# Patient Record
Sex: Male | Born: 1975 | Hispanic: No | Marital: Married | Smoking: Former smoker
Health system: Southern US, Community
[De-identification: ages and names within clinical notes are randomized; demographics above are authoritative.]

## PROBLEM LIST (undated history)

## (undated) DIAGNOSIS — I480 Paroxysmal atrial fibrillation: Secondary | ICD-10-CM

## (undated) DIAGNOSIS — I4891 Unspecified atrial fibrillation: Secondary | ICD-10-CM

## (undated) DIAGNOSIS — I10 Essential (primary) hypertension: Secondary | ICD-10-CM

## (undated) DIAGNOSIS — Z973 Presence of spectacles and contact lenses: Secondary | ICD-10-CM

## (undated) DIAGNOSIS — K269 Duodenal ulcer, unspecified as acute or chronic, without hemorrhage or perforation: Secondary | ICD-10-CM

## (undated) DIAGNOSIS — G4730 Sleep apnea, unspecified: Secondary | ICD-10-CM

## (undated) DIAGNOSIS — M109 Gout, unspecified: Secondary | ICD-10-CM

## (undated) DIAGNOSIS — K259 Gastric ulcer, unspecified as acute or chronic, without hemorrhage or perforation: Secondary | ICD-10-CM

## (undated) HISTORY — PX: WISDOM TOOTH EXTRACTION: SHX21

## (undated) HISTORY — DX: Duodenal ulcer, unspecified as acute or chronic, without hemorrhage or perforation: K26.9

## (undated) HISTORY — PX: ABDOMINAL SURGERY: SHX537

---

## 2002-03-07 ENCOUNTER — Emergency Department (HOSPITAL_COMMUNITY): Admission: EM | Admit: 2002-03-07 | Discharge: 2002-03-08 | Payer: Self-pay | Admitting: Emergency Medicine

## 2002-09-05 ENCOUNTER — Encounter: Payer: Self-pay | Admitting: Emergency Medicine

## 2002-09-05 ENCOUNTER — Emergency Department (HOSPITAL_COMMUNITY): Admission: EM | Admit: 2002-09-05 | Discharge: 2002-09-05 | Payer: Self-pay | Admitting: Emergency Medicine

## 2003-03-27 ENCOUNTER — Emergency Department (HOSPITAL_COMMUNITY): Admission: EM | Admit: 2003-03-27 | Discharge: 2003-03-27 | Payer: Self-pay | Admitting: Emergency Medicine

## 2004-04-25 ENCOUNTER — Emergency Department (HOSPITAL_COMMUNITY): Admission: EM | Admit: 2004-04-25 | Discharge: 2004-04-25 | Payer: Self-pay | Admitting: Emergency Medicine

## 2005-06-22 ENCOUNTER — Emergency Department (HOSPITAL_COMMUNITY): Admission: EM | Admit: 2005-06-22 | Discharge: 2005-06-22 | Payer: Self-pay | Admitting: Family Medicine

## 2005-08-29 ENCOUNTER — Emergency Department (HOSPITAL_COMMUNITY): Admission: EM | Admit: 2005-08-29 | Discharge: 2005-08-29 | Payer: Self-pay | Admitting: Family Medicine

## 2006-11-02 ENCOUNTER — Emergency Department (HOSPITAL_COMMUNITY): Admission: EM | Admit: 2006-11-02 | Discharge: 2006-11-02 | Payer: Self-pay | Admitting: Family Medicine

## 2006-11-09 ENCOUNTER — Emergency Department (HOSPITAL_COMMUNITY): Admission: EM | Admit: 2006-11-09 | Discharge: 2006-11-10 | Payer: Self-pay | Admitting: Emergency Medicine

## 2006-11-24 ENCOUNTER — Emergency Department (HOSPITAL_COMMUNITY): Admission: EM | Admit: 2006-11-24 | Discharge: 2006-11-24 | Payer: Self-pay | Admitting: Family Medicine

## 2006-12-27 ENCOUNTER — Encounter: Payer: Self-pay | Admitting: Family Medicine

## 2006-12-27 ENCOUNTER — Ambulatory Visit: Payer: Self-pay | Admitting: Family Medicine

## 2006-12-27 DIAGNOSIS — M109 Gout, unspecified: Secondary | ICD-10-CM | POA: Insufficient documentation

## 2006-12-27 DIAGNOSIS — I499 Cardiac arrhythmia, unspecified: Secondary | ICD-10-CM

## 2007-01-03 ENCOUNTER — Ambulatory Visit: Payer: Self-pay | Admitting: Family Medicine

## 2007-01-08 ENCOUNTER — Encounter (INDEPENDENT_AMBULATORY_CARE_PROVIDER_SITE_OTHER): Payer: Self-pay | Admitting: *Deleted

## 2007-01-09 ENCOUNTER — Ambulatory Visit: Payer: Self-pay | Admitting: Family Medicine

## 2007-01-09 LAB — CONVERTED CEMR LAB
AST: 18 units/L (ref 0–37)
Albumin: 3.9 g/dL (ref 3.5–5.2)
Alkaline Phosphatase: 73 units/L (ref 39–117)
BUN: 9 mg/dL (ref 6–23)
CO2: 29 meq/L (ref 19–32)
Glucose, Bld: 79 mg/dL (ref 70–99)
HDL: 30.1 mg/dL — ABNORMAL LOW (ref >39.0)
LDL Cholesterol: 96 mg/dL (ref 0–99)
Potassium: 4.6 meq/L (ref 3.5–5.1)
TSH: 2.21 microintl units/mL (ref 0.35–5.50)
Total Protein: 6.5 g/dL (ref 6.0–8.3)
Triglycerides: 146 mg/dL (ref 0–149)
VLDL: 29 mg/dL (ref 0–40)

## 2007-02-12 ENCOUNTER — Telehealth: Payer: Self-pay | Admitting: Family Medicine

## 2007-02-18 ENCOUNTER — Ambulatory Visit: Payer: Self-pay | Admitting: Family Medicine

## 2007-02-19 ENCOUNTER — Encounter: Admission: RE | Admit: 2007-02-19 | Discharge: 2007-02-19 | Payer: Self-pay | Admitting: Family Medicine

## 2007-04-27 ENCOUNTER — Emergency Department (HOSPITAL_COMMUNITY): Admission: EM | Admit: 2007-04-27 | Discharge: 2007-04-27 | Payer: Self-pay | Admitting: Family Medicine

## 2007-05-02 ENCOUNTER — Ambulatory Visit: Payer: Self-pay | Admitting: Family Medicine

## 2007-05-02 ENCOUNTER — Encounter: Admission: RE | Admit: 2007-05-02 | Discharge: 2007-05-02 | Payer: Self-pay | Admitting: Family Medicine

## 2007-05-09 ENCOUNTER — Telehealth: Payer: Self-pay | Admitting: Family Medicine

## 2007-05-13 ENCOUNTER — Encounter: Admission: RE | Admit: 2007-05-13 | Discharge: 2007-08-11 | Payer: Self-pay | Admitting: Family Medicine

## 2007-05-20 ENCOUNTER — Encounter: Admission: RE | Admit: 2007-05-20 | Discharge: 2007-05-20 | Payer: Self-pay | Admitting: Family Medicine

## 2007-05-23 ENCOUNTER — Telehealth (INDEPENDENT_AMBULATORY_CARE_PROVIDER_SITE_OTHER): Payer: Self-pay | Admitting: *Deleted

## 2007-05-31 ENCOUNTER — Encounter: Payer: Self-pay | Admitting: Family Medicine

## 2007-08-21 ENCOUNTER — Emergency Department (HOSPITAL_COMMUNITY): Admission: EM | Admit: 2007-08-21 | Discharge: 2007-08-21 | Payer: Self-pay | Admitting: Emergency Medicine

## 2007-08-26 ENCOUNTER — Ambulatory Visit: Payer: Self-pay | Admitting: Family Medicine

## 2007-08-26 LAB — CONVERTED CEMR LAB
Ketones, urine, test strip: NEGATIVE
Nitrite: NEGATIVE
Urobilinogen, UA: NEGATIVE
pH: 6

## 2007-09-02 ENCOUNTER — Encounter: Admission: RE | Admit: 2007-09-02 | Discharge: 2007-09-02 | Payer: Self-pay | Admitting: Family Medicine

## 2007-09-02 ENCOUNTER — Emergency Department (HOSPITAL_COMMUNITY): Admission: EM | Admit: 2007-09-02 | Discharge: 2007-09-02 | Payer: Self-pay | Admitting: Emergency Medicine

## 2007-09-02 ENCOUNTER — Encounter: Admission: RE | Admit: 2007-09-02 | Discharge: 2007-09-02 | Payer: Self-pay | Admitting: Orthopaedic Surgery

## 2007-09-06 DIAGNOSIS — N281 Cyst of kidney, acquired: Secondary | ICD-10-CM

## 2007-09-09 ENCOUNTER — Encounter: Admission: RE | Admit: 2007-09-09 | Discharge: 2007-09-09 | Payer: Self-pay | Admitting: Family Medicine

## 2007-09-10 ENCOUNTER — Encounter (INDEPENDENT_AMBULATORY_CARE_PROVIDER_SITE_OTHER): Payer: Self-pay | Admitting: *Deleted

## 2007-09-11 ENCOUNTER — Ambulatory Visit: Payer: Self-pay | Admitting: Internal Medicine

## 2007-09-11 ENCOUNTER — Inpatient Hospital Stay (HOSPITAL_COMMUNITY): Admission: EM | Admit: 2007-09-11 | Discharge: 2007-09-13 | Payer: Self-pay | Admitting: Emergency Medicine

## 2007-09-12 ENCOUNTER — Encounter: Payer: Self-pay | Admitting: Family Medicine

## 2007-09-13 ENCOUNTER — Encounter: Payer: Self-pay | Admitting: Family Medicine

## 2007-09-13 ENCOUNTER — Telehealth: Payer: Self-pay | Admitting: Family Medicine

## 2007-09-13 ENCOUNTER — Ambulatory Visit: Payer: Self-pay | Admitting: Internal Medicine

## 2007-09-16 ENCOUNTER — Telehealth: Payer: Self-pay | Admitting: Family Medicine

## 2007-09-18 DIAGNOSIS — K269 Duodenal ulcer, unspecified as acute or chronic, without hemorrhage or perforation: Secondary | ICD-10-CM | POA: Insufficient documentation

## 2007-09-24 ENCOUNTER — Encounter: Payer: Self-pay | Admitting: Family Medicine

## 2007-11-01 ENCOUNTER — Encounter: Payer: Self-pay | Admitting: Family Medicine

## 2008-01-02 ENCOUNTER — Telehealth: Payer: Self-pay | Admitting: Family Medicine

## 2008-02-20 ENCOUNTER — Telehealth: Payer: Self-pay | Admitting: Family Medicine

## 2008-02-21 ENCOUNTER — Telehealth: Payer: Self-pay | Admitting: Family Medicine

## 2008-07-11 ENCOUNTER — Emergency Department (HOSPITAL_COMMUNITY): Admission: EM | Admit: 2008-07-11 | Discharge: 2008-07-12 | Payer: Self-pay | Admitting: Emergency Medicine

## 2010-03-16 ENCOUNTER — Emergency Department (HOSPITAL_COMMUNITY): Admission: EM | Admit: 2010-03-16 | Discharge: 2010-03-16 | Payer: Self-pay | Admitting: Emergency Medicine

## 2010-03-18 ENCOUNTER — Emergency Department (HOSPITAL_COMMUNITY): Admission: EM | Admit: 2010-03-18 | Discharge: 2010-03-18 | Payer: Self-pay | Admitting: Emergency Medicine

## 2010-03-25 ENCOUNTER — Ambulatory Visit (HOSPITAL_COMMUNITY): Admission: RE | Admit: 2010-03-25 | Discharge: 2010-03-25 | Payer: Self-pay | Admitting: Neurology

## 2010-11-20 LAB — URINALYSIS, ROUTINE W REFLEX MICROSCOPIC
Nitrite: NEGATIVE
Protein, ur: NEGATIVE mg/dL
Specific Gravity, Urine: 1.016 (ref 1.005–1.030)
Urobilinogen, UA: 1 mg/dL (ref 0.0–1.0)

## 2010-11-20 LAB — DIFFERENTIAL
Basophils Absolute: 0 10*3/uL (ref 0.0–0.1)
Basophils Relative: 0 % (ref 0–1)
Lymphocytes Relative: 21 % (ref 12–46)
Lymphs Abs: 1.3 10*3/uL (ref 0.7–4.0)
Monocytes Relative: 6 % (ref 3–12)
Neutro Abs: 4.6 10*3/uL (ref 1.7–7.7)
Neutrophils Relative %: 71 % (ref 43–77)

## 2010-11-20 LAB — CSF CELL COUNT WITH DIFFERENTIAL
RBC Count, CSF: 17 /mm3 — ABNORMAL HIGH
Tube #: 1

## 2010-11-20 LAB — CBC
Hemoglobin: 14.3 g/dL (ref 13.0–17.0)
MCH: 28.7 pg (ref 26.0–34.0)
MCHC: 34.9 g/dL (ref 30.0–36.0)
MCV: 82.4 fL (ref 78.0–100.0)

## 2010-11-20 LAB — BASIC METABOLIC PANEL
Chloride: 108 mEq/L (ref 96–112)
GFR calc Af Amer: >60 mL/min (ref >60)
GFR calc non Af Amer: >60 mL/min (ref >60)
Sodium: 142 mEq/L (ref 135–145)

## 2010-11-20 LAB — CSF CULTURE W GRAM STAIN

## 2010-11-20 LAB — GLUCOSE, CSF: Glucose, CSF: 61 mg/dL (ref 43–76)
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
## 2011-01-17 NOTE — Discharge Summary (Signed)
NAME:  Samuel Flynn, Samuel Flynn NO.:  [id number]   MEDICAL RECORD NO.:  [id number]          PATIENT TYPE:  INP   LOCATION:  3025                         FACILITY:  [facility]   PHYSICIAN:  Sean A. Everardo All, MD    DATE OF BIRTH:  1975/12/26   DATE OF ADMISSION:  09/11/2007  DATE OF DISCHARGE:  09/13/2007                               DISCHARGE SUMMARY   DISCHARGE DIAGNOSIS:  1. Right flank/right upper quadrant pain, status post endoscopy      performed September 11, 2006 which was positive for duodenal ulcer.  2. Chronic back pain with known central disk protrusion at T2-T3.      Plan for outpatient appointment with Dr. Ophelia Charter on January 19 as      scheduled.  3. Depression, worsened with chronic pain.  The patient is agreeable a      trial of Cymbalta which we will initiate at time of discharge.   HISTORY OF PRESENT ILLNESS:  Mr. Sporer is a 35 year old male who was  admitted on September 10, 2006 with chief complaint of back pain. He  described the pain as above his right flank. He noted some radiation  along the side to the front of his abdomen.  He noted on admission the  pain was intermittent that was a 10 out of 10 when it flared up.  He was  admitted for further evaluation and treatment.   PAST MEDICAL HISTORY:  1. Gout.  2. Chronic back pain.   COURSE OF HOSPITALIZATION.:  1. Right flank pain radiating to right upper quadrant/epigastric      region.  The patient was admitted.  He was noted to have a white      blood cell count on admission of 19,000.  Follow-up white blood      cell count was 10.8.  The CT angio of the chest was performed which      showed persistent inflammation centered around the descending      duodenum.  It was negative for dissection or pulmonary embolus.      The patient was seen in consultation by [facility], Dr. Stan Head and underwent endoscopy which was performed on September 11, 2006.  Endoscopy was notable for a large duodenal bulb  ulcer      secondary to NSAID use which was felt that it possibly could be      related to his pain, however it was not his initial problem as his      back pain had been present for 1 year.  Erosive esophagitis was      noted as well.  The patient was placed on a proton pump inhibitor.      It was recommended that he on twice daily proton pump inhibitor for      2 weeks and once daily for a total of 8 weeks and consideration for      chronic proton pump inhibitor was also noted.  Dr. Stan Head      wishes to see the patient back as needed. The patient  had been      taking narcotic medications at home including Percocet and Vicodin      as needed for his pain and he states he is not getting relief with      these medications.  As the patient's pain seems to be neuropathic      in nature we will give him a trial of Neurontin to see if this does      not help with his pain.  In addition, he appeared to be quite      depressed during this admission and noted that his chronic pain is      contributing to his depression.  Dr. Leone Payor noted that the patient      might be considered for Cymbalta for both his pain and his      depression.  We will initiate this at time of discharge if the      patient is agreeable.   MEDICATIONS:  1. Protonix 40 mg 1 tablet p.o. b.i.d. x2 weeks and then 1 tablet      daily thereafter.  Prescription given for 2 months supply.  2. Cymbalta 20 mg p.o. daily.  3. Neurontin 300 mg tabs 1 tablet p.o. today then starting September 14, 2007 take 1 tablet p.o. in the a.m. and 1 tablet p.o. in the p.m.      Then starting September 15, 2007 take 1 tablet p.o. t.i.d. for pain.  4. Vicodin 5/500 1 tablet p.o. q.4 hours as needed.  5. Methocarbamol 500 mg p.o. q.6 hours as needed.   The patient was instructed to avoid Motrin, ibuprofen, Aleve an aspirin.   DISPOSITION:  The patient will be discharged to home.   FOLLOW UP:  The patient is scheduled to follow up with  Dr. Kerby Nora  on January 16 at 3:15 p.m.  He is also to follow up with Dr. Ophelia Charter on  January 19 as scheduled.   PHYSICAL EXAM:  VITAL SIGNS:  BP 123/74, heart rate 63, respiratory rate  18, temperature 97.7, O2 sat 96% on room air.  GENERAL: The patient is an obese white male who is awake and alert with  a flat affect. He does appear depressed.  CARDIOVASCULAR: S1 and S2.  Regular rate and rhythm.  LUNGS:  Lungs were clear to auscultation bilaterally without wheezes,  rales or rhonchi.  ABDOMEN: Soft, nontender, nondistended.  Positive  bowel sounds are noted.  EXTREMITIES:  Showed no peripheral edema.   PERTINENT LABORATORY DATA:  At time of discharge H.  Pylori negative.  Hemoglobin A1c 4.9, white blood cell count 10.8, hemoglobin 13.9,  hematocrit 40.1, platelets 339, BUN 15, creatinine 1.11, amylase normal.   DISCHARGE DISPOSITION:  Discharge patient to home.      Sandford Craze, NP      Gregary Signs A. Everardo All, MD  Electronically Signed    MO/MEDQ  D:  09/13/2007  T:  09/13/2007  Job:  [id number]   cc:   Kerby Nora, MD  Veverly Fells. Ophelia Charter, M.D.  Iva Boop, MD,FACG

## 2011-01-17 NOTE — H&P (Signed)
NAME:  Samuel Flynn, Samuel Flynn NO.:  [id number]   MEDICAL RECORD NO.:  [id number]          PATIENT TYPE:  INP   LOCATION:  1830                         FACILITY:  [facility]   PHYSICIAN:  Hollice Espy, M.D.DATE OF BIRTH:  03-17-1976   DATE OF ADMISSION:  09/11/2007  DATE OF DISCHARGE:                              HISTORY & PHYSICAL   PRIMARY CARE PHYSICIAN:  Kerby Nora, MD   CHIEF COMPLAINT:  Back pain.   HISTORY OF PRESENT ILLNESS:  The patient is a 35 year old white male  with past medical history of gout who several months ago started having  complaints of back pain.  The location is described as above his right  flank, more corresponding to the middle to lower lobe of his lung.  He  has some radiation, he feels it can radiate along his side to the front  of his abdomen, but he definitely feels that it is in his back.  When  asked about how this relates to his breathing, he says he feels short of  breath because of it.  It is hard for him to take a deep breath in.  He  can certainly feel it then at that time.  He denies any other complaints  such as hemoptysis or cough but his pain has continued to persist.  It  has been ongoing for several months.  He says it intermittently flares  up or gets pretty severe to be like almost a 10/10 but otherwise he  never notes whether it completely goes away.  The patient has been seen  in the emergency room a few times, twice in December.  At that time he  was given medication for pain but he says the pain medication helped  briefly but when it flares up, it is not able to improve this very much.  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  CT of the abdomen and pelvis without contrast on Jan 31, 2007:  It noted inflammatory changes in the region of the first and  second portion of the duodenum adjacent to the  pancreatic head.  This  may be seen due to duodenal ulcer disease with penetration and  pancreatitis in this region, and incidentally noting a 2.4-cm cystic  lesion in the left kidney and some patchy airspace disease in the left  lower lobe with no evidence of any renal or ureteral calculi.  An  ultrasound of the abdomen done September 02, 2007, noted no evidence of  gallstones, hydronephrosis, and they continued to note a complex cystic  lesion in the left kidney.  These were previous films done September 02, 2007.  The patient as an outpatient had a CT scan of the abdomen done  with and without contrast on Jan 11, 2008.  The patient was noted to have  clear lung bases, heart size normal, the same 1.9-cm lesion in the left  kidney.  A wedge-shaped low-attenuation defect laterally in the spleen,  possibly  a small infarct or flap, and thickening and narrowing of the  descending duodenum with some pooling of oral contrast and mild  periduodenal inflammation similar to findings seen on September 02, 2007.  What was also noted was that on the patient's last ER visit on September 02, 2007, the CBC was slightly elevated at 12.5 with 90% neutrophils.  When the patient's flare-up today started and he presented to the  emergency room, his white count increased up to 19.6 with 97%  neutrophils.  The rest of his labs are essentially unremarkable  including normal BMET, normal lipase and negative UA.  A D-dimer was  ordered, which was found to be in the normal range at 0.29.  I have  ordered a chest x-ray, which is pending.  Currently the patient  complains of continued similar pain again in the same area.   He denies any headaches, vision changes, dysphagia, no chest pain, no  palpitations.  He did complain of some shortness of breath and  difficulty taking a deep inspiration, no cough, no wheezing.  He had  some right upper quadrant abdominal pain, which he says is more  radiation coming from the  back, but his main complaint is of this pain  right above his right flank, not really tender, with no tenderness to  deep palpation or even light blows.  No hematuria, dysuria,  constipation, diarrhea, focal extremity numbness, weakness, pain.  Review of systems otherwise negative.   PAST MEDICAL HISTORY:  Gout.   MEDICATIONS:  He is just on the medications he has received from the  last few ER visits, muscle relaxers and oral narcotics.  The ER has  reviewed his narcotic prescriptions since he has come into the emergency  room in the past year and it shows no signs of excessive use, abuse or  disproportionate amounts in regard to pain.   He has no known drug allergies.   SOCIAL HISTORY:  He denies any tobacco, alcohol or drug use.   FAMILY HISTORY:  Noncontributory.   PHYSICAL EXAM:  The patient's vitals on admission:  Temperature 97,  heart rate 89, blood pressure 154/104, respirations 22, O2 saturation  94% on room air.  GENERAL:  He is alert and oriented x3.  He is in moderate distress  secondary to pain.  HEENT:  Normocephalic, atraumatic.  His mucous membranes are dry.  He  has no carotid bruits.  HEART:  Regular rate and rhythm, S1, S2.  LUNGS:  He has trouble taking a deep inspiration but otherwise is  completely clear to auscultation bilaterally.  ABDOMEN:  Soft, some mild tenderness in the right upper quadrant but  really no real guarding.  Obese.  Nondistended.  Positive bowel sounds.  EXTREMITIES:  No clubbing, cyanosis, or edema.   LAB WORK:  White count 19.6, H&H 15.5 and 46, MCV of 80, platelet count  463, 87% neutrophils.  Sodium 136, potassium 4.4, chloride 100, bicarb  26, BUN 14, creatinine 0.96, glucose 118.  LFTs unremarkable.  UA notes  protein of 30.  Lipase is 28.  D-dimer is 0.29.  Chest x-ray:  I am  waiting for the result.   ASSESSMENT AND PLAN:  1. Back pain, right-sided, above his flank, question pleuritic.  D-      dimer is negative.  It could  be infarct but with his elevated      leukocytosis I question the possibility of infection, although pain      seems to be  his biggest component for this.  It is difficult to say      what the next step would be if his chest x-ray is completely      normal.  I will consider the possibility of a CT scan  __________.      Given his elevated blood pressure, acute findings for pain, no      previous signs of conversion disorder or drug-seeking behavior and      elevated white count, I am concerned about the possibility of      infection versus some other source for pain.  It appears to be      higher in the chest cavity than previously in the abdomen is      focused on.  On the other hand, the only focal finding in that      region is more in the anterior aspect in the duodenal area, the      possibility that this could be all some sort of duodenal ulcer,      although it appears atypical.  We will continue to follow.  2. History of gout, stable.      Hollice Espy, M.D.  Electronically Signed     SKK/MEDQ  D:  09/11/2007  T:  09/11/2007  Job:  [id number]   cc:   Kerby Nora, MD

## 2011-05-25 LAB — COMPREHENSIVE METABOLIC PANEL
AST: 14
Albumin: 3.9
BUN: 14
Chloride: 100
Creatinine, Ser: 0.96
GFR calc Af Amer: >60
Potassium: 4.4
Total Bilirubin: 0.5
Total Protein: 7.6

## 2011-05-25 LAB — URINALYSIS, ROUTINE W REFLEX MICROSCOPIC
Bilirubin Urine: NEGATIVE
Glucose, UA: NEGATIVE
Hgb urine dipstick: NEGATIVE
Specific Gravity, Urine: 1.027
Urobilinogen, UA: 0.2
pH: 5

## 2011-05-25 LAB — DIFFERENTIAL
Basophils Absolute: 0.1
Eosinophils Relative: 0
Lymphocytes Relative: 8 — ABNORMAL LOW
Monocytes Absolute: 0.9
Monocytes Relative: 5
Neutro Abs: 17 — ABNORMAL HIGH

## 2011-05-25 LAB — HEMOGLOBIN A1C: Hgb A1c MFr Bld: 4.9

## 2011-05-25 LAB — CBC
Hemoglobin: 13.9
MCHC: 34.6
MCV: 80.4
Platelets: 339
Platelets: 463 — ABNORMAL HIGH
RDW: 16.8 — ABNORMAL HIGH
RDW: 17.2 — ABNORMAL HIGH
WBC: 19.6 — ABNORMAL HIGH

## 2011-05-25 LAB — D-DIMER, QUANTITATIVE: D-Dimer, Quant: 0.29

## 2011-05-25 LAB — BASIC METABOLIC PANEL
BUN: 15
CO2: 31
Calcium: 8.6
GFR calc non Af Amer: >60
Glucose, Bld: 91
Sodium: 140

## 2011-05-25 LAB — URINE MICROSCOPIC-ADD ON

## 2011-05-25 LAB — AMYLASE: Amylase: 77

## 2011-06-06 LAB — POCT I-STAT, CHEM 8
Creatinine, Ser: 1.3
Glucose, Bld: 97
HCT: 45
Hemoglobin: 15.3
Sodium: 143
TCO2: 28

## 2011-06-09 LAB — COMPREHENSIVE METABOLIC PANEL
ALT: 26
AST: 21
Alkaline Phosphatase: 87
CO2: 26
Chloride: 103
GFR calc Af Amer: >60
GFR calc non Af Amer: >60
Sodium: 140
Total Bilirubin: 1.2

## 2011-06-09 LAB — POCT CARDIAC MARKERS: CKMB, poc: <1 — ABNORMAL LOW

## 2011-06-09 LAB — CBC
HCT: 43.1
Hemoglobin: 14.8
MCHC: 34.2
MCV: 79.2
RBC: 5.44

## 2011-06-09 LAB — URINALYSIS, ROUTINE W REFLEX MICROSCOPIC
Hgb urine dipstick: NEGATIVE
Nitrite: NEGATIVE
Protein, ur: 30 — AB
Urobilinogen, UA: 0.2

## 2011-06-09 LAB — DIFFERENTIAL
Basophils Relative: 0
Eosinophils Relative: 0
Monocytes Absolute: 0.3
Monocytes Relative: 3
Neutro Abs: 11.2 — ABNORMAL HIGH

## 2011-06-16 LAB — I-STAT 8, (EC8 V) (CONVERTED LAB)
Bicarbonate: 26.6 — ABNORMAL HIGH
HCT: 43
TCO2: 28
pCO2, Ven: 48.9
pH, Ven: 7.344 — ABNORMAL HIGH

## 2011-06-16 LAB — DIFFERENTIAL
Basophils Absolute: 0
Basophils Relative: 0
Eosinophils Relative: 1
Monocytes Absolute: 0.3
Neutro Abs: 7.1

## 2011-06-16 LAB — POCT URINALYSIS DIP (DEVICE)
Bilirubin Urine: NEGATIVE
Hgb urine dipstick: NEGATIVE
Nitrite: NEGATIVE
pH: 5.5

## 2011-06-16 LAB — CBC
HCT: 40.4
Hemoglobin: 13.7
MCHC: 34
RDW: 17.4 — ABNORMAL HIGH

## 2012-04-13 ENCOUNTER — Encounter (HOSPITAL_COMMUNITY): Payer: Self-pay

## 2012-04-13 ENCOUNTER — Emergency Department (HOSPITAL_COMMUNITY)
Admission: EM | Admit: 2012-04-13 | Discharge: 2012-04-13 | Disposition: A | Discharge status: Home / Self Care | Payer: Self-pay | Attending: Emergency Medicine | Admitting: Emergency Medicine

## 2012-04-13 DIAGNOSIS — J029 Acute pharyngitis, unspecified: Secondary | ICD-10-CM | POA: Insufficient documentation

## 2012-04-13 DIAGNOSIS — F17200 Nicotine dependence, unspecified, uncomplicated: Secondary | ICD-10-CM | POA: Insufficient documentation

## 2012-04-13 DIAGNOSIS — M109 Gout, unspecified: Secondary | ICD-10-CM | POA: Insufficient documentation

## 2012-04-13 HISTORY — DX: Gout, unspecified: M10.9

## 2012-04-13 HISTORY — DX: Essential (primary) hypertension: I10

## 2012-04-13 LAB — RAPID STREP SCREEN (MED CTR MEBANE ONLY): Streptococcus, Group A Screen (Direct): NEGATIVE

## 2012-04-13 MED ORDER — LIDOCAINE VISCOUS 2 % MT SOLN: OROMUCOSAL | Status: DC

## 2012-04-13 MED ORDER — IBUPROFEN 800 MG PO TABS
800.0000 mg | ORAL_TABLET | Freq: Once | ORAL | Status: AC
  Administered 2012-04-13: 800 mg via ORAL
  Filled 2012-04-13: qty 1

## 2012-04-13 MED ORDER — LIDOCAINE VISCOUS 2 % MT SOLN
20.0000 mL | Freq: Once | OROMUCOSAL | Status: AC
  Administered 2012-04-13: 15 mL via OROMUCOSAL
  Filled 2012-04-13: qty 15

## 2012-04-13 NOTE — ED Notes (Signed)
Pt with c/o sore throat since yesterday. 

## 2012-04-13 NOTE — ED Provider Notes (Signed)
History     CSN: [id number]  Arrival date & time 04/13/12  4098   First MD Initiated Contact with Patient 04/13/12 [phone]      Chief Complaint  Patient presents with  . Sore Throat    (Consider location/radiation/quality/duration/timing/severity/associated sxs/prior treatment) HPI  36 year old man with in no acute distress complaining of sore throat for 24 hours. Patient denies fever and cough.  Past Medical History  Diagnosis Date  . Gout   . Hypertension     History reviewed. No pertinent past surgical history.  History reviewed. No pertinent family history.  History  Substance Use Topics  . Smoking status: Current Everyday Smoker  . Smokeless tobacco: Not on file  . Alcohol Use: Yes      Review of Systems  Constitutional: Negative for fever.  Respiratory: Negative for cough.   All other systems reviewed and are negative.    Allergies  Review of patient's allergies indicates no known allergies.  Home Medications  No current outpatient prescriptions on file.  There were no vitals taken for this visit.  Physical Exam  Nursing note and vitals reviewed. Constitutional: He is oriented to person, place, and time. He appears well-developed and well-nourished. No distress.  HENT:  Head: Normocephalic and atraumatic.  Right Ear: External ear normal.  Left Ear: External ear normal.  Mouth/Throat: Oropharynx is clear and moist. No oropharyngeal exudate.  Eyes: Conjunctivae and EOM are normal.  Cardiovascular: Normal rate.   Pulmonary/Chest: Effort normal and breath sounds normal. No respiratory distress. He has no wheezes. He has no rales. He exhibits no tenderness.  Abdominal: Soft. Bowel sounds are normal.  Musculoskeletal: Normal range of motion.  Lymphadenopathy:    He has no cervical adenopathy.  Neurological: He is alert and oriented to person, place, and time.  Psychiatric: He has a normal mood and affect.    ED Course  Procedures (including  critical care time)   Labs Reviewed  RAPID STREP SCREEN   No results found.   1. Acute pharyngitis       MDM  36 year old man in no acute distress with isolated pharyngitis for 24 hours. Physical exam is normal with no tonsillar hypertrophy or exudate or tenderness to anterior cervical change. Rapid strep is negative I will send for a strep DNA probe and discharge the patient with lidocaine and Motrin for comfort. Return precautions will be given        Wynetta Emery, PA-C 04/13/12 [phone]

## 2012-04-14 LAB — STREP A DNA PROBE: Special Requests: NORMAL

## 2012-04-14 NOTE — ED Provider Notes (Signed)
Medical screening examination/treatment/procedure(s) were performed by non-physician practitioner and as supervising physician I was immediately available for consultation/collaboration.  Daksh Coates, MD 04/14/12 1723 

## 2013-05-11 ENCOUNTER — Encounter (HOSPITAL_COMMUNITY): Payer: Self-pay | Admitting: Emergency Medicine

## 2013-05-11 ENCOUNTER — Emergency Department (HOSPITAL_COMMUNITY)
Admission: EM | Admit: 2013-05-11 | Discharge: 2013-05-11 | Disposition: A | Discharge status: Home / Self Care | Payer: PRIVATE HEALTH INSURANCE | Attending: Emergency Medicine | Admitting: Emergency Medicine

## 2013-05-11 DIAGNOSIS — I493 Ventricular premature depolarization: Secondary | ICD-10-CM

## 2013-05-11 DIAGNOSIS — I4949 Other premature depolarization: Secondary | ICD-10-CM | POA: Insufficient documentation

## 2013-05-11 DIAGNOSIS — F17200 Nicotine dependence, unspecified, uncomplicated: Secondary | ICD-10-CM | POA: Insufficient documentation

## 2013-05-11 DIAGNOSIS — I10 Essential (primary) hypertension: Secondary | ICD-10-CM | POA: Insufficient documentation

## 2013-05-11 DIAGNOSIS — Z79899 Other long term (current) drug therapy: Secondary | ICD-10-CM | POA: Insufficient documentation

## 2013-05-11 DIAGNOSIS — M109 Gout, unspecified: Secondary | ICD-10-CM | POA: Insufficient documentation

## 2013-05-11 LAB — CBC WITH DIFFERENTIAL/PLATELET
Basophils Absolute: 0 10*3/uL (ref 0.0–0.1)
Basophils Relative: 0 % (ref 0–1)
Eosinophils Absolute: 0.2 10*3/uL (ref 0.0–0.7)
Eosinophils Relative: 2 % (ref 0–5)
HCT: 43.6 % (ref 39.0–52.0)
Lymphocytes Relative: 21 % (ref 12–46)
MCH: 28.5 pg (ref 26.0–34.0)
MCHC: 35.3 g/dL (ref 30.0–36.0)
MCV: 80.6 fL (ref 78.0–100.0)
Monocytes Absolute: 0.7 10*3/uL (ref 0.1–1.0)
Platelets: 223 10*3/uL (ref 150–400)
RDW: 16.5 % — ABNORMAL HIGH (ref 11.5–15.5)

## 2013-05-11 LAB — COMPREHENSIVE METABOLIC PANEL
AST: 13 U/L (ref 0–37)
CO2: 26 mEq/L (ref 19–32)
Calcium: 8.8 mg/dL (ref 8.4–10.5)
Creatinine, Ser: 1.04 mg/dL (ref 0.50–1.35)
GFR calc non Af Amer: 90 mL/min — ABNORMAL LOW (ref >90)
Total Protein: 7.2 g/dL (ref 6.0–8.3)

## 2013-05-11 LAB — POCT I-STAT TROPONIN I

## 2013-05-11 NOTE — ED Notes (Signed)
Pt states that his heart feels like it is beating irregularly. Pt states that he feels like he is going to pass out when he does have the irregular heart rate. Pt states tightness in his chest. Pt denies N/V/ sob or diarphroesis

## 2013-05-11 NOTE — ED Notes (Signed)
Pt states that he is having palpations that are happening every few min. Pt ekg's shows PVC;s . Pt states that he took BP meds a year ago and he lost weight and was off BP meds.

## 2013-05-11 NOTE — ED Provider Notes (Signed)
CSN: [id number]     Arrival date & time 05/11/13  2037 History   First MD Initiated Contact with Patient 05/11/13 2129     Chief Complaint  Patient presents with  . Irregular Heart Beat   (Consider location/radiation/quality/duration/timing/severity/associated sxs/prior Treatment) HPI 37 year old male with a past medical history of hypertension that reports that he has been having episodes today where he feels like his breath is being taken away.  He has never had similar episodes. These episodes only last for a couple of seconds. He reports that with one of these episodes he felt like he was almost going to pass out.  He never did actually pass out.  He denies having any chest pain. He denies any recent fevers, sweats, weight loss, illnesses, cough, abdominal pain, nausea, vomiting, or rash.     Past Medical History  Diagnosis Date  . Gout   . Hypertension    History reviewed. No pertinent past surgical history. No family history on file. History  Substance Use Topics  . Smoking status: Current Every Day Smoker  . Smokeless tobacco: Not on file  . Alcohol Use: Yes    Review of Systems  Constitutional: Negative for fever and chills.  HENT: Negative for neck pain and neck stiffness.   Respiratory: Negative for cough and shortness of breath.   Cardiovascular: Negative for chest pain and leg swelling.  Gastrointestinal: Negative for nausea, vomiting and abdominal pain.  Endocrine: Negative for cold intolerance, heat intolerance, polydipsia and polyphagia.  Genitourinary: Negative for dysuria and frequency.  Musculoskeletal: Negative for back pain and gait problem.  Skin: Negative for rash.  Hematological: Negative for adenopathy. Does not bruise/bleed easily.  All other systems reviewed and are negative.    Allergies  Review of patient's allergies indicates no known allergies.  Home Medications   Current Outpatient Rx  Name  Route  Sig  Dispense  Refill  . ibuprofen  (ADVIL,MOTRIN) 200 MG tablet   Oral   Take 800 mg by mouth every 6 (six) hours as needed. For pain         . lidocaine (XYLOCAINE) 2 % solution      10-20 mL swish and swallow every 3 hours when necessary for pain   100 mL   0   . lisinopril-hydrochlorothiazide (PRINZIDE,ZESTORETIC) 20-12.5 MG per tablet   Oral   Take 1 tablet by mouth daily.         . predniSONE (DELTASONE) 20 MG tablet   Oral   Take 20 mg by mouth daily as needed. Take daily during gout flare until resolved.          BP 191/113  Pulse 63  Temp(Src) 98.1 F (36.7 C) (Oral)  Resp 16  SpO2 99% Physical Exam  Vitals reviewed. Constitutional: He is oriented to person, place, and time. He appears well-developed and well-nourished. No distress.  HENT:  Head: Normocephalic.  Right Ear: External ear normal.  Left Ear: External ear normal.  Nose: Nose normal.  Mouth/Throat: Oropharynx is clear and moist. No oropharyngeal exudate.  Eyes: Conjunctivae and EOM are normal. Pupils are equal, round, and reactive to light.  Neck: Normal range of motion. Neck supple.  Cardiovascular: Normal rate, regular rhythm, normal heart sounds and intact distal pulses.  Exam reveals no gallop and no friction rub.   No murmur heard. PVCs on the monitor  Pulmonary/Chest: Effort normal and breath sounds normal.  Abdominal: Soft. Bowel sounds are normal. He exhibits no distension. There is no tenderness.  Musculoskeletal: Normal range of motion. He exhibits no edema and no tenderness.  Neurological: He is alert and oriented to person, place, and time. No cranial nerve deficit.  Skin: Skin is warm and dry. He is not diaphoretic.  Psychiatric: He has a normal mood and affect.    ED Course  Procedures (including critical care time) Labs Review Labs Reviewed  CBC WITH DIFFERENTIAL - Abnormal; Notable for the following:    RDW 16.5 (*)    All other components within normal limits  COMPREHENSIVE METABOLIC PANEL - Abnormal;  Notable for the following:    Glucose, Bld 66 (*)    GFR calc non Af Amer 90 (*)    All other components within normal limits  POCT I-STAT TROPONIN I   Imaging Review No results found.   Date: 05/12/2013  Rate: 71  Rhythm: normal sinus rhythm with PVCs  QRS Axis: normal  Intervals: normal  ST/T Wave abnormalities: nonspecific T wave changes  Conduction Disutrbances:none  Narrative Interpretation: NSR with PVCs and NSTW changes  Old EKG Reviewed: none available   MDM   Symptoms described by the patient correlate with PVCs on the monitor. He has had no chest pain or shortness of breath outside of these isolated episodes. Labs sent from triage were within normal limits. He did not have a doctor to followup with. He was educated on PVCs and given discharge instructions on this diagnosis.  No further workup indicated.  BP 140/89  Pulse 54  Temp(Src) 98.1 F (36.7 C) (Oral)  Resp 17  SpO2 98%   Clinical Impression: 1. PVC's (premature ventricular contractions)   2. Hypertension     Disposition: Discharge  Condition: Good  I have discussed the results, Dx and Tx plan. They expressed understanding and agree with the plan and were told to return to ED with any worsening of condition or concern.    New Prescriptions   No medications on file    Follow Up: [facility]     [address] [place] [address]  Schedule an appointment as soon as possible for a visit    Pt seen in conjunction with Dr. Patria Mane.  Reine Just. Beverely Pace, MD Emergency Medicine PGY-III [phone]    Oleh Genin, MD 05/12/13 [phone]

## 2013-05-11 NOTE — ED Notes (Signed)
MD at bedside. 

## 2013-05-12 NOTE — ED Provider Notes (Signed)
I saw and evaluated the patient, reviewed the resident's note and I agree with the findings and plan.  I personally evaluated the ECG and agree with the interpretation of the resident  Overall well-appearing.  Discharged in good condition.  No indication for additional workup.  Lyanne Co, MD 05/12/13 [phone]

## 2014-02-06 ENCOUNTER — Emergency Department (HOSPITAL_COMMUNITY)
Admission: EM | Admit: 2014-02-06 | Discharge: 2014-02-06 | Disposition: A | Discharge status: Home / Self Care | Payer: PRIVATE HEALTH INSURANCE | Source: Home / Self Care | Attending: Family Medicine | Admitting: Family Medicine

## 2014-02-06 ENCOUNTER — Emergency Department (INDEPENDENT_AMBULATORY_CARE_PROVIDER_SITE_OTHER): Payer: PRIVATE HEALTH INSURANCE

## 2014-02-06 ENCOUNTER — Encounter (HOSPITAL_COMMUNITY): Payer: Self-pay | Admitting: Emergency Medicine

## 2014-02-06 DIAGNOSIS — M940 Chondrocostal junction syndrome [Tietze]: Secondary | ICD-10-CM

## 2014-02-06 MED ORDER — DICLOFENAC SODIUM 50 MG PO TBEC: 50.0000 mg | DELAYED_RELEASE_TABLET | Freq: Two times a day (BID) | ORAL | Status: DC | PRN

## 2014-02-06 MED ORDER — TRAMADOL HCL 50 MG PO TABS: 50.0000 mg | ORAL_TABLET | Freq: Four times a day (QID) | ORAL | Status: DC | PRN

## 2014-02-06 NOTE — ED Notes (Signed)
Applied a rib belt.
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
## 2014-02-06 NOTE — Discharge Instructions (Signed)
Thank you for coming in today. Take diclofenac twice daily. This will not make you sleepy but will increase your blood pressure. Use tramadol for severe pain. This will make you sleepy and you cannot drive after taking this medication. Call or go to the emergency room if you get worse, have trouble breathing, have chest pains, or palpitations.     Costochondritis Costochondritis, sometimes called Tietze syndrome, is a swelling and irritation (inflammation) of the tissue (cartilage) that connects your ribs with your breastbone (sternum). It causes pain in the chest and rib area. Costochondritis usually goes away on its own over time. It can take up to 6 weeks or longer to get better, especially if you are unable to limit your activities. CAUSES  Some cases of costochondritis have no known cause. Possible causes include:  Injury (trauma).  Exercise or activity such as lifting.  Severe coughing. SIGNS AND SYMPTOMS  Pain and tenderness in the chest and rib area.  Pain that gets worse when coughing or taking deep breaths.  Pain that gets worse with specific movements. DIAGNOSIS  Your health care provider will do a physical exam and ask about your symptoms. Chest X-rays or other tests may be done to rule out other problems. TREATMENT  Costochondritis usually goes away on its own over time. Your health care provider may prescribe medicine to help relieve pain. HOME CARE INSTRUCTIONS   Avoid exhausting physical activity. Try not to strain your ribs during normal activity. This would include any activities using chest, abdominal, and side muscles, especially if heavy weights are used.  Apply ice to the affected area for the first 2 days after the pain begins.  Put ice in a plastic bag.  Place a towel between your skin and the bag.  Leave the ice on for 20 minutes, 2 3 times a day.  Only take over-the-counter or prescription medicines as directed by your health care provider. SEEK  MEDICAL CARE IF:  You have redness or swelling at the rib joints. These are signs of infection.  Your pain does not go away despite rest or medicine. SEEK IMMEDIATE MEDICAL CARE IF:   Your pain increases or you are very uncomfortable.  You have shortness of breath or difficulty breathing.  You cough up blood.  You have worse chest pains, sweating, or vomiting.  You have a fever or persistent symptoms for more than 2 3 days.  You have a fever and your symptoms suddenly get worse. MAKE SURE YOU:   Understand these instructions.  Will watch your condition.  Will get help right away if you are not doing well or get worse. Document Released: 05/31/2005 Document Revised: 06/11/2013 Document Reviewed: 03/25/2013 [facility] Patient Information 2014 [place].

## 2014-02-19 ENCOUNTER — Encounter: Payer: Self-pay | Admitting: Family Medicine

## 2014-02-19 ENCOUNTER — Ambulatory Visit (INDEPENDENT_AMBULATORY_CARE_PROVIDER_SITE_OTHER): Payer: PRIVATE HEALTH INSURANCE | Admitting: Family Medicine

## 2014-02-19 VITALS — BP 138/86 | HR 73 | Ht 74.0 in | Wt 247.0 lb

## 2014-02-19 DIAGNOSIS — I10 Essential (primary) hypertension: Secondary | ICD-10-CM

## 2014-02-19 DIAGNOSIS — Z23 Encounter for immunization: Secondary | ICD-10-CM

## 2014-02-19 DIAGNOSIS — H6120 Impacted cerumen, unspecified ear: Secondary | ICD-10-CM

## 2014-02-19 DIAGNOSIS — R7309 Other abnormal glucose: Secondary | ICD-10-CM

## 2014-02-19 DIAGNOSIS — H6121 Impacted cerumen, right ear: Secondary | ICD-10-CM

## 2014-02-19 DIAGNOSIS — Z0000 Encounter for general adult medical examination without abnormal findings: Secondary | ICD-10-CM

## 2014-02-19 DIAGNOSIS — R739 Hyperglycemia, unspecified: Secondary | ICD-10-CM

## 2014-02-19 DIAGNOSIS — Z13220 Encounter for screening for lipoid disorders: Secondary | ICD-10-CM

## 2014-02-19 MED ORDER — NEOMYCIN-POLYMYXIN-HC 1 % OT SOLN
OTIC | Status: AC
Start: 2014-02-19 — End: 2014-03-01

## 2014-02-19 NOTE — Patient Instructions (Signed)
Dr. Lajoyce Lauber General Advice Following Your Complete Physical Exam  The Benefits of Regular Exercise: Unless you suffer from an uncontrolled cardiovascular condition, studies strongly suggest that regular exercise and physical activity will add to both the quality and length of your life.  The World Health Organization recommends 150 minutes of moderate intensity aerobic activity every week.  This is best split over 3-4 days a week, and can be as simple as a brisk walk for just over 35 minutes "most days of the week".  This type of exercise has been shown to lower LDL-Cholesterol, lower average blood sugars, lower blood pressure, lower cardiovascular disease risk, improve memory, and increase one's overall sense of wellbeing.  The addition of anaerobic (or "strength training") exercises offers additional benefits including but not limited to increased metabolism, prevention of osteoporosis, and improved overall cholesterol levels.  How Can I Strive For A Low-Fat Diet?: Current guidelines recommend that 25-35 percent of your daily energy (food) intake should come from fats.  One might ask how can this be achieved without having to dissect each meal on a daily basis?  Switch to skim or 1% milk instead of whole milk.  Focus on lean meats such as ground [place], fresh fish, baked chicken, and lean cuts of beef as your source of dietary protein.  Limit saturated fat consumption to less than 10% of your daily caloric intake.  Limit trans fatty acid consumption primarily by limiting synthetic trans fats such as partially hydrogenated oils (Ex: fried fast foods).  Substitute olive or vegetable oil for solid fats where possible.  Moderation of Salt Intake: Provided you don't carry a diagnosis of congestive heart failure nor renal failure, I recommend a daily allowance of no more than 2300 mg of salt (sodium).  Keeping under this daily goal is associated with a decreased risk of cardiovascular events, creeping  above it can lead to elevated blood pressures and increases your risk of cardiovascular events.  Milligrams (mg) of salt is listed on all nutrition labels, and your daily intake can add up faster than you think.  Most canned and frozen dinners can pack in over half your daily salt allowance in one meal.    Lifestyle Health Risks: Certain lifestyle choices carry specific health risks.  As you may already know, tobacco use has been associated with increasing one's risk of cardiovascular disease, pulmonary disease, numerous cancers, among many other issues.  What you may not know is that there are medications and nicotine replacement strategies that can more than double your chances of successfully quitting.  I would be thrilled to help manage your quitting strategy if you currently use tobacco products.  When it comes to alcohol use, I've yet to find an "ideal" daily allowance.  Provided an individual does not have a medical condition that is exacerbated by alcohol consumption, general guidelines determine "safe drinking" as no more than two standard drinks for a man or no more than one standard drink for a male per day.  However, much debate still exists on whether any amount of alcohol consumption is technically "safe".  My general advice, keep alcohol consumption to a minimum for general health promotion.  If you or others believe that alcohol, tobacco, or recreational drug use is interfering with your life, I would be happy to provide confidential counseling regarding treatment options.  General "Over The Counter" Nutrition Advice: Postmenopausal women should aim for a daily calcium intake of 1200 mg, however a significant portion of this might already be  provided by diets including milk, yogurt, cheese, and other dairy products.  Vitamin D has been shown to help preserve bone density, prevent fatigue, and has even been shown to help reduce falls in the elderly.  Ensuring a daily intake of 800 Units of  Vitamin D is a good place to start to enjoy the above benefits, we can easily check your Vitamin D level to see if you'd potentially benefit from supplementation beyond 800 Units a day.  Folic Acid intake should be of particular concern to women of childbearing age.  Daily consumption of 902-409 mcg of Folic Acid is recommended to minimize the chance of spinal cord defects in a fetus should pregnancy occur.    For many adults, accidents still remain one of the most common culprits when it comes to cause of death.  Some of the simplest but most effective preventitive habits you can adopt include regular seatbelt use, proper helmet use, securing firearms, and regularly testing your smoke and carbon monoxide detectors.  [address], [place] [place], [place] [address] Phone: [phone]  Testicular Self-Exam A self-examination of your testicles involves looking at and feeling your testicles for abnormal lumps or swelling. Several things can cause swelling, lumps, or pain in your testicles. Some of these causes are:  Injuries.  Inflammation.  Infection.  Accumulation of fluids around your testicle (hydrocele).  Twisted testicles (testicular torsion).  Testicular cancer. Self-examination of the testicles and groin areas may be advised if you are at risk for testicular cancer. Risks for testicular cancer include:  An undescended testicle (cryptorchidism).  A history of previous testicular cancer.  A family history of testicular cancer. The testicles are easiest to examine after warm baths or showers and are more difficult to examine when you are cold. This is because the muscles attached to the testicles retract and pull them up higher or into the abdomen. Follow these steps while you are standing:  Hold your penis away from your body.  Roll one testicle between your thumb and forefinger, feeling the entire testicle.  Roll the other  testicle between your thumb and forefinger, feeling the entire testicle. Feel for lumps, swelling, or discomfort. A normal testicle is egg shaped and feels firm. It is smooth and not tender. The spermatic cord can be felt as a firm spaghetti-like cord at the back of your testicle. It is also important to examine the crease between the front of your leg and your abdomen. Feel for any bumps that are tender. These could be enlarged lymph nodes.  Document Released: 11/27/2000 Document Revised: 04/23/2013 Document Reviewed: 02/10/2013 [facility] Patient Information 2015 [place], [place]. This information is not intended to replace advice given to you by your health care Mannix Kroeker. Make sure you discuss any questions you have with your health care Rishita Petron.

## 2014-02-19 NOTE — Progress Notes (Signed)
CC: Samuel Flynn is a 38 y.o. male is here for Establish Care and Annual Exam   Subjective: HPI:  Colonoscopy: No family history of colon cancer will begin screening at age 49 Prostate: Discussed screening risks/beneifts with patient during today's visit. No family history prostate cancer we'll consider screening age 74-50  Influenza Vaccine: No current indication Pneumovax: No current indication Td/Tdap: He does not when his last tetanus booster was he will receive it today  Zoster: (Start 40 yo)   very pleasant 38 year old here to establish care requesting a physical for his work. No acute complaints. He reports a history of essential hypertension for which he controls with diet and exercise. Over the last year he has been able to lose around 75 pounds with diet and exercise.   rare alcohol use no tobacco or recreational drug use. No formal exercise routine but engages in strenuous activity on a daily basis at his job  Review of Systems - General ROS: negative for - chills, fever, night sweats, weight gain or weight loss Ophthalmic ROS: negative for - decreased vision Psychological ROS: negative for - anxiety or depression ENT ROS: negative for - hearing change, nasal congestion, tinnitus or allergies Hematological and Lymphatic ROS: negative for - bleeding problems, bruising or swollen lymph nodes Breast ROS: negative Respiratory ROS: no cough, shortness of breath, or wheezing Cardiovascular ROS: no chest pain or dyspnea on exertion Gastrointestinal ROS: no abdominal pain, change in bowel habits, or black or bloody stools Genito-Urinary ROS: negative for - genital discharge, genital ulcers, incontinence or abnormal bleeding from genitals Musculoskeletal ROS: negative for - joint pain or muscle pain Neurological ROS: negative for - headaches or memory loss Dermatological ROS: negative for lumps, mole changes, rash and skin lesion changes  Past Medical History  Diagnosis Date   . Gout   . Hypertension     History reviewed. No pertinent past surgical history. Family History  Problem Relation Age of Onset  . Hypertension Paternal Uncle   . Hypertension Maternal Grandmother   . Hypertension Paternal Uncle   . Hypertension Paternal Uncle   . Hypertension Paternal Uncle   . Hypertension Paternal Uncle     History   Social History  . Marital Status: Married    Spouse Name: N/A    Number of Children: N/A  . Years of Education: N/A   Occupational History  . Not on file.   Social History Main Topics  . Smoking status: Current Every Day Smoker  . Smokeless tobacco: Not on file  . Alcohol Use: Yes  . Drug Use: No  . Sexual Activity: Yes   Other Topics Concern  . Not on file   Social History Narrative  . No narrative on file     Objective: BP 138/86  Pulse 73  Ht 6\' 2"  (1.88 m)  Wt 247 lb (112.038 kg)  BMI 31.70 kg/m2  General: No Acute Distress HEENT: Atraumatic, normocephalic, conjunctivae normal without scleral icterus.  No nasal discharge. Prior to irrigation of the right ear canal there is a cerumen impaction obscuring the tympanic membrane.  Following cerumen impaction removal the right proximal canal was moderately erythematous and mildly edematous. Following this hearing grossly intact, TMs with good landmarks bilaterally with no middle ear abnormalities, posterior pharynx clear without oral lesions. Neck: Supple, trachea midline, no cervical nor supraclavicular adenopathy. Pulmonary: Clear to auscultation bilaterally without wheezing, rhonchi, nor rales. Cardiac: Regular rate and rhythm.  No murmurs, rubs, nor gallops. No peripheral edema.  2+ peripheral pulses bilaterally. Abdomen: Bowel sounds normal.  No masses.  Non-tender without rebound.  Negative Murphy's sign. GU: Bilaterally descended testes without inguinal hernia MSK: Grossly intact, no signs of weakness.  Full strength throughout upper and lower extremities.  Full ROM in upper  and lower extremities.  No midline spinal tenderness. Neuro: Gait unremarkable, CN II-XII grossly intact.  C5-C6 Reflex 2/4 Bilaterally, L4 Reflex 2/4 Bilaterally.  Cerebellar function intact. Skin: No rashes. Psych: Alert and oriented to person/place/time.  Thought process normal. No anxiety/depression.  Assessment & Plan: [place] was seen today for establish care and annual exam.  Diagnoses and associated orders for this visit:  Essential hypertension, benign - BASIC METABOLIC PANEL WITH GFR  Physical exam  Lipid screening - Lipid panel  Hyperglycemia - BASIC METABOLIC PANEL WITH GFR  Right ear impacted cerumen    Healthy lifestyle interventions including but not limited to regular exercise, a healthy low fat diet, moderation of salt intake, the dangers of tobacco/alcohol/recreational drug use, nutrition supplementation, and accident avoidance were discussed with the patient and a handout was provided for future reference. He is due for routine dyslipidemia screening, he has a history of hyperglycemia on chart review it is unknown whether or not he was fasting during this check, we will obtain fasting labs today. He was provided with Cortisporin to use in the right ear should he develop any discomfort or drainage over the next one to 2 days given erythema and edematous appearance after cerumen removal.  Essential hypertension: Controlled with continued diet and exercise interventions  Indication: Cerumen impaction of the ear(s) Medical necessity statement: On physical examination, cerumen impairs clinically significant portions of the external auditory canal, and tympanic membrane. Noted obstructive, copious cerumen that cannot be removed without magnification and instrumentations requiring physician skills Consent: Discussed benefits and risks of procedure and verbal consent obtained Procedure: Patient was prepped for the procedure. Utilized an otoscope to assess and take note of  the ear canal, the tympanic membrane, and the presence, amount, and placement of the cerumen. Gentle water irrigation and soft plastic curette was utilized to remove cerumen.  Post procedure examination: shows cerumen was completely removed. Patient tolerated procedure well. The patient is made aware that they may experience temporary vertigo, temporary hearing loss, and temporary discomfort. If these symptom last for more than 24 hours to call the clinic or proceed to the ED.         Return in about 1 year (around 02/20/2015) for annual physical.

## 2014-09-21 ENCOUNTER — Emergency Department
Admission: EM | Admit: 2014-09-21 | Discharge: 2014-09-21 | Disposition: A | Discharge status: Home / Self Care | Payer: PRIVATE HEALTH INSURANCE | Source: Home / Self Care

## 2014-09-21 ENCOUNTER — Emergency Department (INDEPENDENT_AMBULATORY_CARE_PROVIDER_SITE_OTHER): Payer: PRIVATE HEALTH INSURANCE

## 2014-09-21 ENCOUNTER — Encounter: Payer: Self-pay | Admitting: *Deleted

## 2014-09-21 ENCOUNTER — Ambulatory Visit (INDEPENDENT_AMBULATORY_CARE_PROVIDER_SITE_OTHER): Payer: PRIVATE HEALTH INSURANCE | Admitting: Sports Medicine

## 2014-09-21 DIAGNOSIS — W19XXXA Unspecified fall, initial encounter: Secondary | ICD-10-CM

## 2014-09-21 DIAGNOSIS — S52001A Unspecified fracture of upper end of right ulna, initial encounter for closed fracture: Secondary | ICD-10-CM

## 2014-09-21 DIAGNOSIS — S52041A Displaced fracture of coronoid process of right ulna, initial encounter for closed fracture: Secondary | ICD-10-CM

## 2014-09-21 DIAGNOSIS — M25421 Effusion, right elbow: Secondary | ICD-10-CM

## 2014-09-21 MED ORDER — KETOROLAC TROMETHAMINE 30 MG/ML IJ SOLN
30.0000 mg | Freq: Once | INTRAMUSCULAR | Status: AC
  Administered 2014-09-21: 30 mg via INTRAMUSCULAR

## 2014-09-21 MED ORDER — HYDROCODONE-ACETAMINOPHEN 5-325 MG PO TABS: 1.0000 | ORAL_TABLET | Freq: Three times a day (TID) | ORAL | Status: DC | PRN

## 2014-09-21 NOTE — ED Notes (Signed)
Pt c/o RT arm injury x 2 days ago, post fall at home. He took 4 IBF at 0430 today.

## 2014-09-21 NOTE — Progress Notes (Signed)
   Subjective:    I'm seeing this patient as a consultation for:  Dr. Assunta Found  CC: Right elbow injury  HPI: Couple of days ago this pleasant 39 year old male fell, he injured his right elbow and had immediate pain, swelling, and loss of function. He was seen by Dr. Assunta Found, x-rays showed a displaced fracture of the coronoid process and as referred for further evaluation and definitive treatment. Pain is severe. Does not radiate.  Past medical history, Surgical history, Family history not pertinant except as noted below, Social history, Allergies, and medications have been entered into the medical record, reviewed, and no changes needed.   Review of Systems: No headache, visual changes, nausea, vomiting, diarrhea, constipation, dizziness, abdominal pain, skin rash, fevers, chills, night sweats, weight loss, swollen lymph nodes, body aches, joint swelling, muscle aches, chest pain, shortness of breath, mood changes, visual or auditory hallucinations.   Objective:   General: Well Developed, well nourished, and in no acute distress.  Neuro/Psych: Alert and oriented x3, extra-ocular muscles intact, able to move all 4 extremities, sensation grossly intact. Skin: Warm and dry, no rashes noted.  Respiratory: Not using accessory muscles, speaking in full sentences, trachea midline.  Cardiovascular: Pulses palpable, no extremity edema. Abdomen: Does not appear distended. Right elbow: Swollen, tender to palpation predominantly in the anterior elbow with a palpable fluid wave and effusion. He is neurovascularly intact distally.  X-rays were personally reviewed and show a displaced fracture of the coronoid process of the ulna.  Procedure: Real-time Ultrasound Guided therapeutic aspiration/Injection of right elbow hemarthrosis Device: GE Logiq E  Verbal informed consent obtained.  Time-out conducted.  Noted no overlying erythema, induration, or other signs of local infection.  Skin prepped in a sterile  fashion.  Local anesthesia: Topical Ethyl chloride.  With sterile technique and under real time ultrasound guidance:  18-gauge needle advanced from an anterolateral approach into the elbow joint, there was a visible effusion. A total of 5 mL of serosanguineous fluid was aspirated, syringe switched and 2 mL lidocaine, 2 mL Marcaine injected into the joint. Completed without difficulty  Pain immediately resolved suggesting accurate placement of the medication.  Advised to call if fevers/chills, erythema, induration, drainage, or persistent bleeding.  Images permanently stored and available for review in the ultrasound unit.  Impression: Technically successful ultrasound guided injection.  Noncontrast CT was ordered that shows a comminuted fracture with displacement of the coronoid process of the right ulna, there are fracture fragments inside the elbow joint. Fracture does extensively extend into the articular surface of the proximal ulna.  Elbow was strapped with compressive dressing and a sling applied.  Impression and Recommendations:   This case required medical decision making of moderate complexity.

## 2014-09-21 NOTE — ED Provider Notes (Signed)
CSN: [id number]     Arrival date & time 09/21/14  1009 History   None    Chief Complaint  Patient presents with  . Arm Injury      HPI Comments: Patient fell on a concrete floor two days ago landing on his right elbow.  He has had persistent pain, swelling, and limited range of motion.  Patient is a 39 y.o. male presenting with arm injury. The history is provided by the patient.  Arm Injury Location:  Elbow Time since incident:  2 days Injury: yes   Mechanism of injury: fall   Fall:    Fall occurred:  Standing   Impact surface:  Hard floor   Point of impact: right elbow. Elbow location:  R elbow Pain details:    Quality:  Aching   Radiates to:  Does not radiate   Severity:  Moderate   Onset quality:  Sudden   Duration:  2 days   Timing:  Constant   Progression:  Unchanged Chronicity:  New Prior injury to area:  No Relieved by:  Nothing Worsened by:  Movement Ineffective treatments:  NSAIDs Associated symptoms: decreased range of motion, stiffness and swelling   Associated symptoms: no back pain, no muscle weakness, no numbness and no tingling     Past Medical History  Diagnosis Date  . Gout   . Hypertension    History reviewed. No pertinent past surgical history. Family History  Problem Relation Age of Onset  . Hypertension Paternal Uncle   . Hypertension Maternal Grandmother   . Hypertension Paternal Uncle   . Hypertension Paternal Uncle   . Hypertension Paternal Uncle   . Hypertension Paternal Uncle    History  Substance Use Topics  . Smoking status: Current Every Day Smoker  . Smokeless tobacco: Not on file  . Alcohol Use: Yes    Review of Systems  Musculoskeletal: Positive for stiffness. Negative for back pain.  All other systems reviewed and are negative.   Allergies  Review of patient's allergies indicates no known allergies.  Home Medications   Prior to Admission medications   Medication Sig Start Date End Date Taking? Authorizing Provider   traMADol (ULTRAM) 50 MG tablet Take 1 tablet (50 mg total) by mouth every 6 (six) hours as needed. 02/06/14   Gregor Hams, MD   BP 176/94 mmHg  Pulse 66  Temp(Src) 98.6 F (37 C) (Oral)  Resp 18  Ht 6\' 2"  (1.88 m)  Wt 257 lb (116.574 kg)  BMI 32.98 kg/m2  SpO2 100% Physical Exam  Constitutional: He is oriented to person, place, and time. He appears well-developed and well-nourished. No distress.  Patient is obese (BMI 33.0)  HENT:  Head: Atraumatic.  Eyes: Pupils are equal, round, and reactive to light.  Musculoskeletal:       Right elbow: He exhibits decreased range of motion, swelling and effusion. He exhibits no deformity and no laceration. Tenderness found.  Right elbow is diffusely tender to palpation and swollen.  Range of motion is limited.  Distal neurovascular function is intact.   Neurological: He is alert and oriented to person, place, and time.  Skin: Skin is warm and dry.  Nursing note and vitals reviewed.   ED Course  Procedures  none    Imaging Review Dg Elbow Complete Right  09/21/2014   CLINICAL DATA:  Initial encounter for fall 2 days ago on his right elbow hitting concrete. Severe right elbow pain with swelling and bruising.  EXAM: RIGHT  ELBOW - COMPLETE 3+ VIEW  COMPARISON:  None.  FINDINGS: Four views study shows a comminuted fracture involving the coronoid process of the proximal ulna. No associated radial head fracture is evident. No findings for subluxation or dislocation. Elevation of the anterior fat pad is consistent with associated joint effusion.  IMPRESSION: Comminuted acute fracture involving the coronoid process of the proximal ulna with associated joint effusion.   Electronically Signed   By: Misty Stanley M.D.   On: 09/21/2014 11:03     MDM   1. Fracture of proximal end of right ulna, closed, initial encounter   2. Fall     Will refer to Dr. Aundria Mems for fracture management    Kandra Nicolas, MD 09/21/14 1140

## 2014-09-21 NOTE — Assessment & Plan Note (Signed)
Fracture is displaced, there is hemarthrosis. Aspirated hemarthrosis, injected marcaine for pain relief. CT for further delineation, and this will probably need ORIF. Referral to Dr. Tamera Punt. Hydrocodone for pain. Strapped with compressive dressing, sling.

## 2014-09-28 ENCOUNTER — Other Ambulatory Visit: Payer: Self-pay | Admitting: Orthopedic Surgery

## 2014-09-28 ENCOUNTER — Encounter (HOSPITAL_BASED_OUTPATIENT_CLINIC_OR_DEPARTMENT_OTHER): Payer: Self-pay | Admitting: *Deleted

## 2014-09-28 NOTE — Progress Notes (Signed)
No pcp-no labs needed

## 2014-09-29 ENCOUNTER — Encounter (HOSPITAL_BASED_OUTPATIENT_CLINIC_OR_DEPARTMENT_OTHER)
Admission: RE | Disposition: A | Discharge status: Home / Self Care | Payer: Self-pay | Source: Ambulatory Visit | Attending: Orthopedic Surgery

## 2014-09-29 ENCOUNTER — Ambulatory Visit (HOSPITAL_BASED_OUTPATIENT_CLINIC_OR_DEPARTMENT_OTHER): Payer: PRIVATE HEALTH INSURANCE | Admitting: Anesthesiology

## 2014-09-29 ENCOUNTER — Ambulatory Visit (HOSPITAL_BASED_OUTPATIENT_CLINIC_OR_DEPARTMENT_OTHER)
Admission: RE | Admit: 2014-09-29 | Discharge: 2014-09-29 | Disposition: A | Discharge status: Home / Self Care | Payer: PRIVATE HEALTH INSURANCE | Source: Ambulatory Visit | Attending: Orthopedic Surgery | Admitting: Orthopedic Surgery

## 2014-09-29 ENCOUNTER — Encounter (HOSPITAL_BASED_OUTPATIENT_CLINIC_OR_DEPARTMENT_OTHER): Payer: Self-pay | Admitting: *Deleted

## 2014-09-29 DIAGNOSIS — Z79899 Other long term (current) drug therapy: Secondary | ICD-10-CM | POA: Diagnosis not present

## 2014-09-29 DIAGNOSIS — S42401A Unspecified fracture of lower end of right humerus, initial encounter for closed fracture: Secondary | ICD-10-CM | POA: Diagnosis not present

## 2014-09-29 DIAGNOSIS — S59901A Unspecified injury of right elbow, initial encounter: Secondary | ICD-10-CM | POA: Diagnosis present

## 2014-09-29 DIAGNOSIS — F17210 Nicotine dependence, cigarettes, uncomplicated: Secondary | ICD-10-CM | POA: Insufficient documentation

## 2014-09-29 DIAGNOSIS — M109 Gout, unspecified: Secondary | ICD-10-CM | POA: Insufficient documentation

## 2014-09-29 DIAGNOSIS — Z8249 Family history of ischemic heart disease and other diseases of the circulatory system: Secondary | ICD-10-CM | POA: Diagnosis not present

## 2014-09-29 DIAGNOSIS — I10 Essential (primary) hypertension: Secondary | ICD-10-CM | POA: Diagnosis not present

## 2014-09-29 DIAGNOSIS — Z6832 Body mass index (BMI) 32.0-32.9, adult: Secondary | ICD-10-CM | POA: Insufficient documentation

## 2014-09-29 HISTORY — DX: Presence of spectacles and contact lenses: Z97.3

## 2014-09-29 HISTORY — PX: ELBOW LIGAMENT RECONSTRUCTION: SHX6359

## 2014-09-29 LAB — POCT HEMOGLOBIN-HEMACUE: HEMOGLOBIN: 14.7 g/dL (ref 13.0–17.0)

## 2014-09-29 SURGERY — RECONSTRUCTION, LIGAMENT, ELBOW
Anesthesia: General | Laterality: Right

## 2014-09-29 MED ORDER — BUPIVACAINE-EPINEPHRINE (PF) 0.5% -1:200000 IJ SOLN
INTRAMUSCULAR | Status: DC | PRN
  Administered 2014-09-29: 30 mL via PERINEURAL

## 2014-09-29 MED ORDER — ONDANSETRON HCL 4 MG/2ML IJ SOLN
INTRAMUSCULAR | Status: DC | PRN
  Administered 2014-09-29: 4 mg via INTRAVENOUS

## 2014-09-29 MED ORDER — PROMETHAZINE HCL 25 MG/ML IJ SOLN
6.2500 mg | INTRAMUSCULAR | Status: DC | PRN
Start: 2014-09-29 — End: 2014-09-29

## 2014-09-29 MED ORDER — OXYCODONE HCL 5 MG PO TABS
ORAL_TABLET | ORAL | Status: AC
  Filled 2014-09-29: qty 1

## 2014-09-29 MED ORDER — DEXAMETHASONE SODIUM PHOSPHATE 4 MG/ML IJ SOLN
INTRAMUSCULAR | Status: DC | PRN
  Administered 2014-09-29: 10 mg via INTRAVENOUS

## 2014-09-29 MED ORDER — FENTANYL CITRATE 0.05 MG/ML IJ SOLN
50.0000 ug | INTRAMUSCULAR | Status: DC | PRN
  Administered 2014-09-29: 100 ug via INTRAVENOUS

## 2014-09-29 MED ORDER — OXYCODONE HCL 5 MG/5ML PO SOLN: 5.0000 mg | Freq: Once | ORAL | Status: AC | PRN

## 2014-09-29 MED ORDER — SUFENTANIL CITRATE 50 MCG/ML IV SOLN
INTRAVENOUS | Status: DC | PRN
  Administered 2014-09-29: 10 ug via INTRAVENOUS
  Administered 2014-09-29: 5 ug via INTRAVENOUS

## 2014-09-29 MED ORDER — CEFAZOLIN SODIUM-DEXTROSE 2-3 GM-% IV SOLR
2.0000 g | INTRAVENOUS | Status: AC
  Administered 2014-09-29: 2 g via INTRAVENOUS

## 2014-09-29 MED ORDER — MIDAZOLAM HCL 5 MG/5ML IJ SOLN
INTRAMUSCULAR | Status: DC | PRN
  Administered 2014-09-29: 2 mg via INTRAVENOUS

## 2014-09-29 MED ORDER — PROPOFOL 10 MG/ML IV BOLUS
INTRAVENOUS | Status: DC | PRN
  Administered 2014-09-29: 200 mg via INTRAVENOUS

## 2014-09-29 MED ORDER — LIDOCAINE HCL (CARDIAC) 20 MG/ML IV SOLN
INTRAVENOUS | Status: DC | PRN
  Administered 2014-09-29: 50 mg via INTRAVENOUS

## 2014-09-29 MED ORDER — FENTANYL CITRATE 0.05 MG/ML IJ SOLN
INTRAMUSCULAR | Status: AC
  Filled 2014-09-29: qty 2

## 2014-09-29 MED ORDER — CEFAZOLIN SODIUM 1-5 GM-% IV SOLN
INTRAVENOUS | Status: AC
  Filled 2014-09-29: qty 100

## 2014-09-29 MED ORDER — LACTATED RINGERS IV SOLN
INTRAVENOUS | Status: DC
  Administered 2014-09-29 (×2): via INTRAVENOUS

## 2014-09-29 MED ORDER — POVIDONE-IODINE 7.5 % EX SOLN: Freq: Once | CUTANEOUS | Status: DC

## 2014-09-29 MED ORDER — LIDOCAINE HCL 2 % IJ SOLN
INTRAMUSCULAR | Status: AC
  Filled 2014-09-29: qty 20

## 2014-09-29 MED ORDER — OXYCODONE-ACETAMINOPHEN 5-325 MG PO TABS: 1.0000 | ORAL_TABLET | ORAL | Status: DC | PRN

## 2014-09-29 MED ORDER — SUFENTANIL CITRATE 50 MCG/ML IV SOLN
INTRAVENOUS | Status: AC
  Filled 2014-09-29: qty 1

## 2014-09-29 MED ORDER — PROPOFOL 10 MG/ML IV EMUL
INTRAVENOUS | Status: AC
  Filled 2014-09-29: qty 50

## 2014-09-29 MED ORDER — HYDROMORPHONE HCL 1 MG/ML IJ SOLN: 0.2500 mg | INTRAMUSCULAR | Status: DC | PRN

## 2014-09-29 MED ORDER — MIDAZOLAM HCL 2 MG/2ML IJ SOLN
1.0000 mg | INTRAMUSCULAR | Status: DC | PRN
  Administered 2014-09-29: 2 mg via INTRAVENOUS

## 2014-09-29 MED ORDER — BUPIVACAINE HCL (PF) 0.5 % IJ SOLN
INTRAMUSCULAR | Status: AC
  Filled 2014-09-29: qty 30

## 2014-09-29 MED ORDER — MIDAZOLAM HCL 2 MG/2ML IJ SOLN
INTRAMUSCULAR | Status: AC
  Filled 2014-09-29: qty 2

## 2014-09-29 MED ORDER — OXYCODONE HCL 5 MG PO TABS
5.0000 mg | ORAL_TABLET | Freq: Once | ORAL | Status: AC | PRN
  Administered 2014-09-29: 5 mg via ORAL

## 2014-09-29 MED ORDER — BUPIVACAINE HCL (PF) 0.25 % IJ SOLN
INTRAMUSCULAR | Status: AC
  Filled 2014-09-29: qty 30

## 2014-09-29 MED ORDER — KETOROLAC TROMETHAMINE 30 MG/ML IJ SOLN: 30.0000 mg | Freq: Once | INTRAMUSCULAR | Status: DC | PRN

## 2014-09-29 MED ORDER — 0.9 % SODIUM CHLORIDE (POUR BTL) OPTIME
TOPICAL | Status: DC | PRN
  Administered 2014-09-29: 1000 mL

## 2014-09-29 MED ORDER — DOCUSATE SODIUM 100 MG PO CAPS: 100.0000 mg | ORAL_CAPSULE | Freq: Three times a day (TID) | ORAL | Status: DC | PRN

## 2014-09-29 SURGICAL SUPPLY — 85 items
ANCH SUT 1 SHRT SM RGD INSRTR (Anchor) ×2 IMPLANT
ANCHOR SUT 1.45 SZ 1 SHORT (Anchor) ×4 IMPLANT
BANDAGE ELASTIC 4 VELCRO ST LF (GAUZE/BANDAGES/DRESSINGS) ×6 IMPLANT
BIT DRILL 2.0 LNG QUCK RELEASE (BIT) IMPLANT
BIT DRILL MINI LNG ACUTRAK 2 (BIT) IMPLANT
BLADE SURG 15 STRL LF DISP TIS (BLADE) ×1 IMPLANT
BLADE SURG 15 STRL SS (BLADE) ×3
BNDG CMPR 9X4 STRL LF SNTH (GAUZE/BANDAGES/DRESSINGS) ×1
BNDG COHESIVE 3X5 TAN STRL LF (GAUZE/BANDAGES/DRESSINGS) ×2 IMPLANT
BNDG ESMARK 4X9 LF (GAUZE/BANDAGES/DRESSINGS) ×3 IMPLANT
BUR EGG/OVAL CARBIDE (BURR) ×1 IMPLANT
CANISTER SUCT 1200ML W/VALVE (MISCELLANEOUS) ×2 IMPLANT
CHLORAPREP W/TINT 26ML (MISCELLANEOUS) ×3 IMPLANT
CLOSURE WOUND 1/2 X4 (GAUZE/BANDAGES/DRESSINGS) ×1
CORDS BIPOLAR (ELECTRODE) ×3 IMPLANT
COVER BACK TABLE 60X90IN (DRAPES) ×3 IMPLANT
COVER MAYO STAND STRL (DRAPES) ×3 IMPLANT
DECANTER SPIKE VIAL GLASS SM (MISCELLANEOUS) IMPLANT
DRAPE EXTREMITY T 121X128X90 (DRAPE) ×3 IMPLANT
DRAPE OEC MINIVIEW 54X84 (DRAPES) ×3 IMPLANT
DRAPE SURG 17X23 STRL (DRAPES) ×3 IMPLANT
DRAPE U 20/CS (DRAPES) ×3 IMPLANT
DRAPE U-SHAPE 47X51 STRL (DRAPES) IMPLANT
DRILL 2.0 LNG QUICK RELEASE (BIT) ×3
DRILL MINI LNG ACUTRAK 2 (BIT) ×3
ELECT REM PT RETURN 9FT ADLT (ELECTROSURGICAL) ×3
ELECTRODE REM PT RTRN 9FT ADLT (ELECTROSURGICAL) ×1 IMPLANT
GAUZE SPONGE 4X4 12PLY STRL (GAUZE/BANDAGES/DRESSINGS) ×3 IMPLANT
GAUZE XEROFORM 1X8 LF (GAUZE/BANDAGES/DRESSINGS) ×2 IMPLANT
GLOVE BIO SURGEON STRL SZ7 (GLOVE) ×3 IMPLANT
GLOVE BIO SURGEON STRL SZ7.5 (GLOVE) ×3 IMPLANT
GLOVE BIOGEL PI IND STRL 7.0 (GLOVE) ×1 IMPLANT
GLOVE BIOGEL PI IND STRL 8 (GLOVE) ×1 IMPLANT
GLOVE BIOGEL PI INDICATOR 7.0 (GLOVE) ×2
GLOVE BIOGEL PI INDICATOR 8 (GLOVE) ×2
GOWN STRL REUS W/ TWL LRG LVL3 (GOWN DISPOSABLE) ×2 IMPLANT
GOWN STRL REUS W/ TWL XL LVL3 (GOWN DISPOSABLE) ×1 IMPLANT
GOWN STRL REUS W/TWL LRG LVL3 (GOWN DISPOSABLE) ×6
GOWN STRL REUS W/TWL XL LVL3 (GOWN DISPOSABLE) ×3
GUIDEWIRE ORTHO MINI ACTK .045 (WIRE) ×4 IMPLANT
LOOP VESSEL MAXI BLUE (MISCELLANEOUS) ×2 IMPLANT
NDL HYPO 25X1 1.5 SAFETY (NEEDLE) IMPLANT
NEEDLE HYPO 25X1 1.5 SAFETY (NEEDLE) IMPLANT
NS IRRIG 1000ML POUR BTL (IV SOLUTION) ×3 IMPLANT
PACK BASIN DAY SURGERY FS (CUSTOM PROCEDURE TRAY) ×3 IMPLANT
PAD CAST 4YDX4 CTTN HI CHSV (CAST SUPPLIES) ×2 IMPLANT
PADDING CAST ABS 4INX4YD NS (CAST SUPPLIES) ×4
PADDING CAST ABS COTTON 4X4 ST (CAST SUPPLIES) ×1 IMPLANT
PADDING CAST COTTON 4X4 STRL (CAST SUPPLIES) ×6
PENCIL BUTTON HOLSTER BLD 10FT (ELECTRODE) ×3 IMPLANT
RETRIEVER SUT HEWSON (MISCELLANEOUS) ×2 IMPLANT
SCREW ACUTRAK 2 MINI 28MM (Screw) ×4 IMPLANT
SPLINT FAST PLASTER 5X30 (CAST SUPPLIES) ×20
SPLINT PLASTER CAST FAST 5X30 (CAST SUPPLIES) IMPLANT
SPLINT PLASTER CAST XFAST 4X15 (CAST SUPPLIES) IMPLANT
SPLINT PLASTER XTRA FAST SET 4 (CAST SUPPLIES)
SPONGE LAP 4X18 X RAY DECT (DISPOSABLE) ×3 IMPLANT
STAPLER VISISTAT 35W (STAPLE) ×2 IMPLANT
STOCKINETTE 4X48 STRL (DRAPES) ×3 IMPLANT
STRIP CLOSURE SKIN 1/2X4 (GAUZE/BANDAGES/DRESSINGS) ×2 IMPLANT
SUCTION FRAZIER TIP 10 FR DISP (SUCTIONS) ×2 IMPLANT
SUT 2 FIBERLOOP 20 STRT BLUE (SUTURE) ×6
SUT FIBERWIRE #2 38 T-5 BLUE (SUTURE) ×3
SUT MNCRL AB 4-0 PS2 18 (SUTURE) IMPLANT
SUT SILK 3 0 TIES 17X18 (SUTURE)
SUT SILK 3-0 18XBRD TIE BLK (SUTURE) IMPLANT
SUT TIGER TAPE 7 IN WHITE (SUTURE) IMPLANT
SUT VIC AB 0 CT1 27 (SUTURE)
SUT VIC AB 0 CT1 27XBRD ANBCTR (SUTURE) IMPLANT
SUT VIC AB 0 SH 27 (SUTURE) ×2 IMPLANT
SUT VIC AB 2-0 SH 27 (SUTURE) ×6
SUT VIC AB 2-0 SH 27XBRD (SUTURE) IMPLANT
SUT VIC AB 3-0 SH 27 (SUTURE)
SUT VIC AB 3-0 SH 27X BRD (SUTURE) IMPLANT
SUT VICRYL AB 3 0 TIES (SUTURE) IMPLANT
SUTURE 2 FIBERLOOP 20 STRT BLU (SUTURE) ×2 IMPLANT
SUTURE FIBERWR #2 38 T-5 BLUE (SUTURE) IMPLANT
SYR BULB 3OZ (MISCELLANEOUS) ×3 IMPLANT
SYR CONTROL 10ML LL (SYRINGE) IMPLANT
TAPE FIBER 2MM 7IN #2 BLUE (SUTURE) IMPLANT
TOWEL OR 17X24 6PK STRL BLUE (TOWEL DISPOSABLE) ×3 IMPLANT
TOWEL OR NON WOVEN STRL DISP B (DISPOSABLE) ×3 IMPLANT
TUBE CONNECTING 20'X1/4 (TUBING) ×1
TUBE CONNECTING 20X1/4 (TUBING) ×1 IMPLANT
UNDERPAD 30X30 INCONTINENT (UNDERPADS AND DIAPERS) ×3 IMPLANT

## 2014-09-29 NOTE — Transfer of Care (Signed)
Immediate Anesthesia Transfer of Care Note  Patient: Samuel Flynn [facility]  Procedure(s) Performed: Procedure(s) with comments: CORANOID FRACTURE FIXATION, POSSIBLE LIGAMENT REPAIR (Right) - Right elbow coranoid fracture fixation, possible ligament repair  Patient Location: PACU  Anesthesia Type:General and Regional  Level of Consciousness: awake, alert  and oriented  Airway & Oxygen Therapy: Patient Spontanous Breathing and Patient connected to face mask oxygen  Post-op Assessment: Report given to PACU RN and Post -op Vital signs reviewed and stable  Post vital signs: Reviewed and stable  Complications: No apparent anesthesia complications

## 2014-09-29 NOTE — Addendum Note (Signed)
Addendum  created 09/29/14 1235 by Willa Frater, CRNA   Modules edited: Anesthesia Blocks and Procedures, Clinical Notes   Clinical Notes:  File: [id number]

## 2014-09-29 NOTE — Anesthesia Procedure Notes (Addendum)
Anesthesia Regional Block:  Supraclavicular block  Pre-Anesthetic Checklist: ,, timeout performed, Correct Patient, Correct Site, Correct Laterality, Correct Procedure, Correct Position, site marked, Risks and benefits discussed,  Surgical consent,  Pre-op evaluation,  At surgeon's request and post-op pain management  Laterality: Right  Prep: chloraprep       Needles:  Injection technique: Single-shot  Needle Type: Echogenic Stimulator Needle     Needle Length: 9cm 9 cm Needle Gauge: 21 and 21 G    Additional Needles:  Procedures: ultrasound guided (picture in chart) and nerve stimulator Supraclavicular block  Nerve Stimulator or Paresthesia:  Response: triceps, 0.4 mA,   Additional Responses:   Narrative:  Start time: 09/29/2014 7:05 AM End time: 09/29/2014 7:15 AM Injection made incrementally with aspirations every 5 mL.  Performed by: Personally  Anesthesiologist: Suzette Battiest E  Additional Notes: Risks, benefits and alternative to block explained extensively.  Patient tolerated procedure well, without complications.   Procedure Name: LMA Insertion Date/Time: 09/29/2014 7:39 AM Performed by: Melynda Ripple D Pre-anesthesia Checklist: Patient identified, Emergency Drugs available, Suction available and Patient being monitored Patient Re-evaluated:Patient Re-evaluated prior to inductionOxygen Delivery Method: Circle System Utilized Preoxygenation: Pre-oxygenation with 100% oxygen Intubation Type: IV induction Ventilation: Mask ventilation without difficulty LMA: LMA inserted LMA Size: 5.0 Number of attempts: 1 Airway Equipment and Method: Bite block Placement Confirmation: positive ETCO2 Tube secured with: Tape Dental Injury: Teeth and Oropharynx as per pre-operative assessment

## 2014-09-29 NOTE — Op Note (Signed)
Procedure(s):   Samuel Flynn male 39 y.o. 09/29/2014  Procedure(s) and Anesthesia Type:   #1 open reduction internal fixation comminuted right elbow medial coronoid fracture  #2 anterior right ulnar nerve subcutaneous transposition #3 open repair of the ulnar collateral ligament right elbow  Surgeon(s) and Role:    * Nita Sells, MD - Primary   Indications:  39 y.o. male s/p fall with right comminuted medial coronoid fracture with varus instability. Indicated for surgery to promote anatomic restoration anatomy, restore stability, improve functional outcome.     Surgeon: Nita Sells   Assistants: Jeanmarie Hubert PA-C [facility] was present and scrubbed throughout the procedure and was essential in positioning, retraction, exposure, and closure)  Anesthesia: General endotracheal anesthesia with preoperative interscalene block given by the attending anesthesiologist    Procedure Detail    Findings: Anatomic reduction of the main fracture fragment with mini Acutrak headless compression screws, removal of small comminuted fragments and fixation of the anterior capsule and tip of the coronoid with suture fixation. The ulnar collateral ligament was repaired with a juggerknot suture anchor  Estimated Blood Loss:  Minimal         Drains: none  Blood Given: none         Specimens: none        Complications:  * No complications entered in OR log *         Disposition: PACU - hemodynamically stable.         Condition: stable    Procedure:  DESCRIPTION OF PROCEDURE: The patient was identified in preoperative  holding area where I personally marked the operative site after  verifying site, side, and procedure with the patient. He had a preoperative interscalene block given by the attending anesthesiologist. The patient was taken back to the operating room where general anesthesia was induced without  complication and was kept in the supine  position. The right upper extremity was laid on a hand table. A nonsterile tourniquet was applied to the upper arm. After sterile prepping and draping, the limb was exsanguinated using an Esmarch dressing. The tourniquet was elevated to 250 mmHg. A approximately 15 cm incision was made posterior medially and flaps were elevated medial and lateral. The ulnar nerve was identified proximally and carefully dissected out of its groove controlling it with a vessel loop. Once freed proximally and distally enough to transposed anteriorly the flexor pronator origin was carefully elevated anteriorly exposing the fracture and joint. The ulnar collateral ligament was noted to be completely disrupted from the humeral side. The joint was irrigated and several small bony fragments that had no soft tissue attachments were removed. There was one fragment approximately 10 x 5 millimeters attached to the anterior capsule which was felt to represent the tip of the coronoid. This was captured with suture fixation with a #2 FiberWire for later repair. The main fracture fragment was medial. It was mainly a sagittal oriented split directly into the joint. Once it was mobilized and cleaned of hematoma and was able to anatomically reduce and hold place with a K wire. X-rays showed anatomic reduction and therefore it was fixed in place with 2 mini Acutrak head was compression screws with excellent fixation and compression at the fracture. Drill tunnels were then made from the posterior ulna into the fracture bed anteriorly and the sutures from the anterior capsule and small anterior fragment were passed and tied down reducing this fragment in the anterior capsule. At this point attention was turned to  the ulnar collateral ligament which was then repaired with a juggernaut suture anchor at the isometric point in a locking Krakw configuration with excellent fixation. A second juggernaut suture anchor was placed on the medial epicondyle and  used to repair the flexor pronator origin back to its origin. 0 Vicryl was then used to close remaining fascia keeping the ulnar nerve anteriorly transposed out of the fascia which was under no undue tension and no points of compression. Small fascial sling was used to keep the nerve anteriorly. Copious irrigation was used and then the skin was closed with 2-0 Vicryl and staples. Tourniquet was let down for a total tourniquet time approximately 105 minutes at 250 mmHg. A light sterile dressing was applied as well as a posterior splint at 90 in neutral. Patient was then allowed to awaken from anesthesia transferred to the stretcher and taken to the recovery room in stable condition.  POSTOPERATIVE PLAN: He will be observed in recovery room and discharged home as long as pain is well-controlled. He will follow-up in 3 days for wound check at which point he will come out of the splint and into a hinged elbow brace unlocked for early active assist range of motion.

## 2014-09-29 NOTE — Progress Notes (Signed)
Assisted Dr. Rob Fitzgerald with right, ultrasound guided, supraclavicular block. Side rails up, monitors on throughout procedure. See vital signs in flow sheet. Tolerated Procedure well. 

## 2014-09-29 NOTE — Anesthesia Postprocedure Evaluation (Signed)
  Anesthesia Post-op Note  Patient: Samuel Flynn  Procedure(s) Performed: Procedure(s) with comments: CORANOID FRACTURE FIXATION, POSSIBLE LIGAMENT REPAIR (Right) - Right elbow coranoid fracture fixation, possible ligament repair  Patient Location: PACU  Anesthesia Type:GA combined with regional for post-op pain  Level of Consciousness: awake and alert   Airway and Oxygen Therapy: Patient Spontanous Breathing  Post-op Pain: none  Post-op Assessment: Post-op Vital signs reviewed  Post-op Vital Signs: Reviewed  Last Vitals:  Filed Vitals:   09/29/14 1115  BP: 149/78  Pulse: 76  Temp: 36.6 C  Resp: 20    Complications: No apparent anesthesia complications

## 2014-09-29 NOTE — Anesthesia Preprocedure Evaluation (Addendum)
Anesthesia Evaluation  Patient identified by MRN, date of birth, ID band Patient awake    Reviewed: Allergy & Precautions, NPO status , Patient's Chart, lab work & pertinent test results  Airway Mallampati: III  TM Distance: >3 FB Neck ROM: Full    Dental   Pulmonary Current Smoker,          Cardiovascular hypertension,     Neuro/Psych negative neurological ROS     GI/Hepatic Neg liver ROS, PUD,   Endo/Other  Morbid obesity  Renal/GU negative Renal ROS     Musculoskeletal   Abdominal   Peds  Hematology negative hematology ROS (+)   Anesthesia Other Findings   Reproductive/Obstetrics                            Anesthesia Physical Anesthesia Plan  ASA: II  Anesthesia Plan: General   Post-op Pain Management: MAC Combined w/ Regional for Post-op pain   Induction: Intravenous  Airway Management Planned: Oral ETT  Additional Equipment:   Intra-op Plan:   Post-operative Plan: Extubation in OR  Informed Consent: I have reviewed the patients History and Physical, chart, labs and discussed the procedure including the risks, benefits and alternatives for the proposed anesthesia with the patient or authorized representative who has indicated his/her understanding and acceptance.   Dental advisory given  Plan Discussed with: CRNA  Anesthesia Plan Comments:         Anesthesia Quick Evaluation

## 2014-09-29 NOTE — Discharge Instructions (Signed)
Discharge Instructions after Elbow Surgery   Keep wearing your splint, use the sling for support. Do not get the splint wet. Pain medicine has been prescribed for you.  Use your medicine liberally over the first 48 hours, and then you can begin to taper your use. You may take Extra Strength Tylenol or Tylenol only in place of the pain pills.  Take one aspirin a day for 2 weeks after surgery, unless you have an aspirin sensitivity/ allergy or asthma.  Wiggle fingers frequently, try to elevate the arm as much as possible DO NOT take ANY nonsteroidal anti-inflammatory pain medications: Advil, Motrin, Ibuprofen, Aleve, Naproxen, or Narprosyn.  Please call [phone] during normal business hours or [phone] after hours for any problems. Including the following:  - excessive redness of the incisions - drainage for more than 4 days - fever of more than 101.5 F  *Please note that pain medications will not be refilled after hours or on weekends.    Post Anesthesia Home Care Instructions  Activity: Get plenty of rest for the remainder of the day. A responsible adult should stay with you for 24 hours following the procedure.  For the next 24 hours, DO NOT: -Drive a car -Paediatric nurse -Drink alcoholic beverages -Take any medication unless instructed by your physician -Make any legal decisions or sign important papers.  Meals: Start with liquid foods such as gelatin or soup. Progress to regular foods as tolerated. Avoid greasy, spicy, heavy foods. If nausea and/or vomiting occur, drink only clear liquids until the nausea and/or vomiting subsides. Call your physician if vomiting continues.  Special Instructions/Symptoms: Your throat may feel dry or sore from the anesthesia or the breathing tube placed in your throat during surgery. If this causes discomfort, gargle with warm salt water. The discomfort should disappear within 24 hours.   Regional Anesthesia Blocks  1. Numbness or  the inability to move the "blocked" extremity may last from 3-48 hours after placement. The length of time depends on the medication injected and your individual response to the medication. If the numbness is not going away after 48 hours, call your surgeon.  2. The extremity that is blocked will need to be protected until the numbness is gone and the  Strength has returned. Because you cannot feel it, you will need to take extra care to avoid injury. Because it may be weak, you may have difficulty moving it or using it. You may not know what position it is in without looking at it while the block is in effect.  3. For blocks in the legs and feet, returning to weight bearing and walking needs to be done carefully. You will need to wait until the numbness is entirely gone and the strength has returned. You should be able to move your leg and foot normally before you try and bear weight or walk. You will need someone to be with you when you first try to ensure you do not fall and possibly risk injury.  4. Bruising and tenderness at the needle site are common side effects and will resolve in a few days.  5. Persistent numbness or new problems with movement should be communicated to the surgeon or the [place] [phone] [place] [phone]).   Call your surgeon if you experience:   1.  Fever over 101.0. 2.  Inability to urinate. 3.  Nausea and/or vomiting. 4.  Extreme swelling or bruising at the surgical site. 5.  Continued bleeding from  the incision. 6.  Increased pain, redness or drainage from the incision. 7.  Problems related to your pain medication. 8. Any change in color, movement and/or sensation 9. Any problems and/or concernsCall your surgeon if you experience:   1.  Fever over 101.0. 2.  Inability to urinate. 3.  Nausea and/or vomiting. 4.  Extreme swelling or bruising at the surgical site. 5.  Continued bleeding from the incision. 6.   Increased pain, redness or drainage from the incision. 7.  Problems related to your pain medication. 8. Any change in color, movement and/or sensation 9. Any problems and/or concerns

## 2014-09-29 NOTE — H&P (Signed)
Samuel Flynn is an 39 y.o. male.   Chief Complaint: R elbow injury HPI: s/p fall with R elbow displaced coronoid fracture  Past Medical History  Diagnosis Date  . Gout   . Hypertension     no meds  . Wears glasses     Past Surgical History  Procedure Laterality Date  . Wisdom tooth extraction      Family History  Problem Relation Age of Onset  . Hypertension Paternal Uncle   . Hypertension Maternal Grandmother   . Hypertension Paternal Uncle   . Hypertension Paternal Uncle   . Hypertension Paternal Uncle   . Hypertension Paternal Uncle    Social History:  reports that he has been smoking Cigars.  He does not have any smokeless tobacco history on file. He reports that he drinks alcohol. He reports that he does not use illicit drugs.  Allergies: No Known Allergies  Medications Prior to Admission  Medication Sig Dispense Refill  . HYDROcodone-acetaminophen (NORCO/VICODIN) 5-325 MG per tablet Take 1 tablet by mouth every 8 (eight) hours as needed for moderate pain. 40 tablet 0    Results for orders placed or performed during the hospital encounter of 09/29/14 (from the past 48 hour(s))  Hemoglobin-hemacue, POC     Status: None   Collection Time: 09/29/14  6:43 AM  Result Value Ref Range   Hemoglobin 14.7 13.0 - 17.0 g/dL   No results found.  Review of Systems  All other systems reviewed and are negative.   Height 6\' 2"  (1.88 m), weight 114.306 kg (252 lb). Physical Exam  Constitutional: He is oriented to person, place, and time. He appears well-developed and well-nourished.  HENT:  Head: Atraumatic.  Eyes: EOM are normal.  Cardiovascular: Intact distal pulses.   Respiratory: Effort normal.  Musculoskeletal:  R elbow with swelling ecchymosis, NVID  Neurological: He is alert and oriented to person, place, and time.  Skin: Skin is warm and dry.  Psychiatric: He has a normal mood and affect.     Assessment/Plan R elbow displaced coronoid fracture Plan  ORIF Risks / benefits of surgery discussed Consent on chart  NPO for OR Preop antibiotics   Samuel Flynn WILLIAM 09/29/2014, 7:15 AM

## 2014-09-30 ENCOUNTER — Encounter (HOSPITAL_BASED_OUTPATIENT_CLINIC_OR_DEPARTMENT_OTHER): Payer: Self-pay | Admitting: Orthopedic Surgery

## 2014-11-06 ENCOUNTER — Emergency Department (HOSPITAL_COMMUNITY)
Admission: EM | Admit: 2014-11-06 | Discharge: 2014-11-06 | Disposition: A | Discharge status: Home / Self Care | Payer: PRIVATE HEALTH INSURANCE | Attending: Emergency Medicine | Admitting: Emergency Medicine

## 2014-11-06 ENCOUNTER — Encounter (HOSPITAL_COMMUNITY): Payer: Self-pay | Admitting: Neurology

## 2014-11-06 DIAGNOSIS — Z8719 Personal history of other diseases of the digestive system: Secondary | ICD-10-CM | POA: Insufficient documentation

## 2014-11-06 DIAGNOSIS — I10 Essential (primary) hypertension: Secondary | ICD-10-CM | POA: Insufficient documentation

## 2014-11-06 DIAGNOSIS — R1013 Epigastric pain: Secondary | ICD-10-CM | POA: Diagnosis not present

## 2014-11-06 DIAGNOSIS — Z8739 Personal history of other diseases of the musculoskeletal system and connective tissue: Secondary | ICD-10-CM | POA: Diagnosis not present

## 2014-11-06 DIAGNOSIS — R1012 Left upper quadrant pain: Secondary | ICD-10-CM | POA: Diagnosis present

## 2014-11-06 DIAGNOSIS — Z8711 Personal history of peptic ulcer disease: Secondary | ICD-10-CM | POA: Diagnosis not present

## 2014-11-06 DIAGNOSIS — R7989 Other specified abnormal findings of blood chemistry: Secondary | ICD-10-CM | POA: Insufficient documentation

## 2014-11-06 DIAGNOSIS — Z720 Tobacco use: Secondary | ICD-10-CM | POA: Insufficient documentation

## 2014-11-06 DIAGNOSIS — R319 Hematuria, unspecified: Secondary | ICD-10-CM

## 2014-11-06 HISTORY — DX: Gastric ulcer, unspecified as acute or chronic, without hemorrhage or perforation: K25.9

## 2014-11-06 LAB — COMPREHENSIVE METABOLIC PANEL
ALBUMIN: 4 g/dL (ref 3.5–5.2)
ALT: 14 U/L (ref 0–53)
ANION GAP: 9 (ref 5–15)
AST: 16 U/L (ref 0–37)
Alkaline Phosphatase: 90 U/L (ref 39–117)
BUN: 14 mg/dL (ref 6–23)
CHLORIDE: 107 mmol/L (ref 96–112)
CO2: 24 mmol/L (ref 19–32)
CREATININE: 1.44 mg/dL — AB (ref 0.50–1.35)
Calcium: 8.7 mg/dL (ref 8.4–10.5)
GFR calc Af Amer: 69 mL/min — ABNORMAL LOW (ref >90)
GFR calc non Af Amer: 60 mL/min — ABNORMAL LOW (ref >90)
GLUCOSE: 103 mg/dL — AB (ref 70–99)
Potassium: 4 mmol/L (ref 3.5–5.1)
Sodium: 140 mmol/L (ref 135–145)
TOTAL PROTEIN: 7.3 g/dL (ref 6.0–8.3)
Total Bilirubin: 0.9 mg/dL (ref 0.3–1.2)

## 2014-11-06 LAB — CBC WITH DIFFERENTIAL/PLATELET
BASOS ABS: 0 10*3/uL (ref 0.0–0.1)
BASOS PCT: 0 % (ref 0–1)
EOS PCT: 1 % (ref 0–5)
Eosinophils Absolute: 0.1 10*3/uL (ref 0.0–0.7)
HEMATOCRIT: 41.5 % (ref 39.0–52.0)
Hemoglobin: 14.2 g/dL (ref 13.0–17.0)
Lymphocytes Relative: 10 % — ABNORMAL LOW (ref 12–46)
Lymphs Abs: 0.9 10*3/uL (ref 0.7–4.0)
MCH: 27.5 pg (ref 26.0–34.0)
MCHC: 34.2 g/dL (ref 30.0–36.0)
MCV: 80.4 fL (ref 78.0–100.0)
MONO ABS: 0.5 10*3/uL (ref 0.1–1.0)
Monocytes Relative: 6 % (ref 3–12)
NEUTROS ABS: 7.6 10*3/uL (ref 1.7–7.7)
Neutrophils Relative %: 83 % — ABNORMAL HIGH (ref 43–77)
Platelets: 209 10*3/uL (ref 150–400)
RBC: 5.16 MIL/uL (ref 4.22–5.81)
RDW: 16.9 % — AB (ref 11.5–15.5)
WBC: 9.1 10*3/uL (ref 4.0–10.5)

## 2014-11-06 LAB — I-STAT TROPONIN, ED: Troponin i, poc: 0 ng/mL (ref 0.00–0.08)

## 2014-11-06 LAB — URINE MICROSCOPIC-ADD ON

## 2014-11-06 LAB — URINALYSIS, ROUTINE W REFLEX MICROSCOPIC
BILIRUBIN URINE: NEGATIVE
GLUCOSE, UA: NEGATIVE mg/dL
Ketones, ur: NEGATIVE mg/dL
Leukocytes, UA: NEGATIVE
Nitrite: NEGATIVE
PROTEIN: 30 mg/dL — AB
Specific Gravity, Urine: 1.015 (ref 1.005–1.030)
UROBILINOGEN UA: 1 mg/dL (ref 0.0–1.0)
pH: 5.5 (ref 5.0–8.0)

## 2014-11-06 LAB — LIPASE, BLOOD: LIPASE: 33 U/L (ref 11–59)

## 2014-11-06 MED ORDER — ONDANSETRON 4 MG PO TBDP
4.0000 mg | ORAL_TABLET | Freq: Once | ORAL | Status: AC
  Administered 2014-11-06: 4 mg via ORAL
  Filled 2014-11-06: qty 1

## 2014-11-06 MED ORDER — PANTOPRAZOLE SODIUM 40 MG PO TBEC: 40.0000 mg | DELAYED_RELEASE_TABLET | Freq: Every day | ORAL | Status: DC

## 2014-11-06 MED ORDER — PANTOPRAZOLE SODIUM 40 MG PO TBEC
80.0000 mg | DELAYED_RELEASE_TABLET | Freq: Every day | ORAL | Status: DC
  Administered 2014-11-06: 80 mg via ORAL
  Filled 2014-11-06: qty 2

## 2014-11-06 MED ORDER — GI COCKTAIL ~~LOC~~
30.0000 mL | Freq: Once | ORAL | Status: AC
  Administered 2014-11-06: 30 mL via ORAL
  Filled 2014-11-06: qty 30

## 2014-11-06 MED ORDER — ONDANSETRON 4 MG PO TBDP: 4.0000 mg | ORAL_TABLET | Freq: Three times a day (TID) | ORAL | Status: DC | PRN

## 2014-11-06 MED ORDER — SUCRALFATE 1 G PO TABS: 1.0000 g | ORAL_TABLET | Freq: Three times a day (TID) | ORAL | Status: DC

## 2014-11-06 NOTE — ED Notes (Signed)
Pt reports LUQ abd pain since Monday. Vomiting x 2 today, has been nauseated since Monday. Pt is a x 4.

## 2014-11-06 NOTE — ED Notes (Signed)
Pt tolerated gingerale well 

## 2014-11-06 NOTE — Discharge Instructions (Signed)
Please avoid alcohol, NSAIDs such as ibuprofen, aspirin, Aleve. Please follow up with a gastroenterologist after symptoms continue   Gastritis, Adult Gastritis is soreness and swelling (inflammation) of the lining of the stomach. Gastritis can develop as a sudden onset (acute) or long-term (chronic) condition. If gastritis is not treated, it can lead to stomach bleeding and ulcers. CAUSES  Gastritis occurs when the stomach lining is weak or damaged. Digestive juices from the stomach then inflame the weakened stomach lining. The stomach lining may be weak or damaged due to viral or bacterial infections. One common bacterial infection is the Helicobacter pylori infection. Gastritis can also result from excessive alcohol consumption, taking certain medicines, or having too much acid in the stomach.  SYMPTOMS  In some cases, there are no symptoms. When symptoms are present, they may include:  Pain or a burning sensation in the upper abdomen.  Nausea.  Vomiting.  An uncomfortable feeling of fullness after eating. DIAGNOSIS  Your caregiver may suspect you have gastritis based on your symptoms and a physical exam. To determine the cause of your gastritis, your caregiver may perform the following:  Blood or stool tests to check for the H pylori bacterium.  Gastroscopy. A thin, flexible tube (endoscope) is passed down the esophagus and into the stomach. The endoscope has a light and camera on the end. Your caregiver uses the endoscope to view the inside of the stomach.  Taking a tissue sample (biopsy) from the stomach to examine under a microscope. TREATMENT  Depending on the cause of your gastritis, medicines may be prescribed. If you have a bacterial infection, such as an H pylori infection, antibiotics may be given. If your gastritis is caused by too much acid in the stomach, H2 blockers or antacids may be given. Your caregiver may recommend that you stop taking aspirin, ibuprofen, or other  nonsteroidal anti-inflammatory drugs (NSAIDs). HOME CARE INSTRUCTIONS  Only take over-the-counter or prescription medicines as directed by your caregiver.  If you were given antibiotic medicines, take them as directed. Finish them even if you start to feel better.  Drink enough fluids to keep your urine clear or pale yellow.  Avoid foods and drinks that make your symptoms worse, such as:  Caffeine or alcoholic drinks.  Chocolate.  Peppermint or mint flavorings.  Garlic and onions.  Spicy foods.  Citrus fruits, such as oranges, lemons, or limes.  Tomato-based foods such as sauce, chili, salsa, and pizza.  Fried and fatty foods.  Eat small, frequent meals instead of large meals. SEEK IMMEDIATE MEDICAL CARE IF:   You have black or dark red stools.  You vomit blood or material that looks like coffee grounds.  You are unable to keep fluids down.  Your abdominal pain gets worse.  You have a fever.  You do not feel better after 1 week.  You have any other questions or concerns. MAKE SURE YOU:  Understand these instructions.  Will watch your condition.  Will get help right away if you are not doing well or get worse. Document Released: 08/15/2001 Document Revised: 02/20/2012 Document Reviewed: 10/04/2011 [facility] Patient Information 2015 [place], [place]. This information is not intended to replace advice given to you by your health care provider. Make sure you discuss any questions you have with your health care provider.    Food Choices for Peptic Ulcer Disease When you have peptic ulcer disease, the foods you eat and your eating habits are very important. Choosing the right foods can help ease the discomfort  of peptic ulcer disease. WHAT GENERAL GUIDELINES DO I NEED TO FOLLOW?  Choose fruits, vegetables, whole grains, and low-fat meat, fish, and poultry.   Keep a food diary to identify foods that cause symptoms.  Avoid foods that cause irritation or pain.  These may be different for different people.  Eat frequent small meals instead of three large meals each day. The pain may be worse when your stomach is empty.  Avoid eating close to bedtime. WHAT FOODS ARE NOT RECOMMENDED? The following are some foods and drinks that may worsen your symptoms:  Black, white, and red pepper.  Hot sauce.  Chili peppers.  Chili powder.  Chocolate and cocoa.   Alcohol.  Tea, coffee, and cola (regular and decaffeinated). The items listed above may not be a complete list of foods and beverages to avoid. Contact your dietitian for more information. Document Released: 11/13/2011 Document Revised: 08/26/2013 Document Reviewed: 06/25/2013 [facility] Patient Information 2015 [place], [place]. This information is not intended to replace advice given to you by your health care provider. Make sure you discuss any questions you have with your health care provider.   Possible Kidney Stones - you passed something in your urine that looked like a kidney stone. You did have some blood in your urine but no other sign of infection.  I recommended if you began having back pain, pain with urination, fever that you follow-up with your primary care physician or urology.   Kidney stones (urolithiasis) are deposits that form inside your kidneys. The intense pain is caused by the stone moving through the urinary tract. When the stone moves, the ureter goes into spasm around the stone. The stone is usually passed in the urine.  CAUSES   A disorder that makes certain neck glands produce too much parathyroid hormone (primary hyperparathyroidism).  A buildup of uric acid crystals, similar to gout in your joints.  Narrowing (stricture) of the ureter.  A kidney obstruction present at birth (congenital obstruction).  Previous surgery on the kidney or ureters.  Numerous kidney infections. SYMPTOMS   Feeling sick to your stomach (nauseous).  Throwing up  (vomiting).  Blood in the urine (hematuria).  Pain that usually spreads (radiates) to the groin.  Frequency or urgency of urination. DIAGNOSIS   Taking a history and physical exam.  Blood or urine tests.  CT scan.  Occasionally, an examination of the inside of the urinary bladder (cystoscopy) is performed. TREATMENT   Observation.  Increasing your fluid intake.  Extracorporeal shock wave lithotripsy--This is a noninvasive procedure that uses shock waves to break up kidney stones.  Surgery may be needed if you have severe pain or persistent obstruction. There are various surgical procedures. Most of the procedures are performed with the use of small instruments. Only small incisions are needed to accommodate these instruments, so recovery time is minimized. The size, location, and chemical composition are all important variables that will determine the proper choice of action for you. Talk to your health care provider to better understand your situation so that you will minimize the risk of injury to yourself and your kidney.  HOME CARE INSTRUCTIONS   Drink enough water and fluids to keep your urine clear or pale yellow. This will help you to pass the stone or stone fragments.  Strain all urine through the provided strainer. Keep all particulate matter and stones for your health care provider to see. The stone causing the pain may be as small as a grain of salt. It is  very important to use the strainer each and every time you pass your urine. The collection of your stone will allow your health care provider to analyze it and verify that a stone has actually passed. The stone analysis will often identify what you can do to reduce the incidence of recurrences.  Only take over-the-counter or prescription medicines for pain, discomfort, or fever as directed by your health care provider.  Make a follow-up appointment with your health care provider as directed.  Get follow-up X-rays if  required. The absence of pain does not always mean that the stone has passed. It may have only stopped moving. If the urine remains completely obstructed, it can cause loss of kidney function or even complete destruction of the kidney. It is your responsibility to make sure X-rays and follow-ups are completed. Ultrasounds of the kidney can show blockages and the status of the kidney. Ultrasounds are not associated with any radiation and can be performed easily in a matter of minutes. SEEK MEDICAL CARE IF:  You experience pain that is progressive and unresponsive to any pain medicine you have been prescribed. SEEK IMMEDIATE MEDICAL CARE IF:   Pain cannot be controlled with the prescribed medicine.  You have a fever or shaking chills.  The severity or intensity of pain increases over 18 hours and is not relieved by pain medicine.  You develop a new onset of abdominal pain.  You feel faint or pass out.  You are unable to urinate. MAKE SURE YOU:   Understand these instructions.  Will watch your condition.  Will get help right away if you are not doing well or get worse. Document Released: 08/21/2005 Document Revised: 04/23/2013 Document Reviewed: 01/22/2013 [facility] Patient Information 2015 [place], [place]. This information is not intended to replace advice given to you by your health care provider. Make sure you discuss any questions you have with your health care provider.

## 2014-11-06 NOTE — ED Notes (Signed)
Pt provided ginger ale.  

## 2014-11-06 NOTE — ED Provider Notes (Signed)
TIME SEEN: 12:45 PM  CHIEF COMPLAINT: Abdominal pain, vomiting  HPI: Pt is a 38 y.o. male with history of hypertension, prior gastric ulcer in 2006 who presents to the emergency room with complaints of left upper quadrant sharp pain that radiates into his back the past 4 days into it says of nonbloody, nonbilious vomiting today. States this feels similar to his prior peptic ulcers. Denies aggravating or relieving factors. Reports that he does take ibuprofen every other day and drinks 4-5 beers at least 2 times a week. Denies bloody stool or melena. Denies fever. Denies prior history of abdominal surgery. Is not sure who his gastroenterologist is a has not followed up since being admitted in 2006.  ROS: See HPI Constitutional: no fever  Eyes: no drainage  ENT: no runny nose   Cardiovascular:  no chest pain  Resp: no SOB  GI:  vomiting GU: no dysuria Integumentary: no rash  Allergy: no hives  Musculoskeletal: no leg swelling  Neurological: no slurred speech ROS otherwise negative  PAST MEDICAL HISTORY/PAST SURGICAL HISTORY:  Past Medical History  Diagnosis Date  . Gout   . Hypertension     no meds  . Wears glasses   . Gastric ulcer     MEDICATIONS:  Prior to Admission medications   Medication Sig Start Date End Date Taking? Authorizing Provider  docusate sodium (COLACE) 100 MG capsule Take 1 capsule (100 mg total) by mouth 3 (three) times daily as needed. 09/29/14   Grier Mitts, PA-C  oxyCODONE-acetaminophen (ROXICET) 5-325 MG per tablet Take 1-2 tablets by mouth every 4 (four) hours as needed for severe pain. 09/29/14   Grier Mitts, PA-C    ALLERGIES:  No Known Allergies  SOCIAL HISTORY:  History  Substance Use Topics  . Smoking status: Current Every Day Smoker    Types: Cigars  . Smokeless tobacco: Not on file  . Alcohol Use: Yes     Comment: occ    FAMILY HISTORY: Family History  Problem Relation Age of Onset  . Hypertension Paternal Uncle   .  Hypertension Maternal Grandmother   . Hypertension Paternal Uncle   . Hypertension Paternal Uncle   . Hypertension Paternal Uncle   . Hypertension Paternal Uncle     EXAM: BP 193/113 mmHg  Pulse 69  Temp(Src) 98.3 F (36.8 C) (Oral)  Resp 15  Ht 6\' 2"  (1.88 m)  Wt 257 lb (116.574 kg)  BMI 32.98 kg/m2  SpO2 99% CONSTITUTIONAL: Alert and oriented and responds appropriately to questions. Well-appearing; well-nourished, nontoxic, in no distress, does not appear significantly uncomfortable HEAD: Normocephalic EYES: Conjunctivae clear, PERRL ENT: normal nose; no rhinorrhea; moist mucous membranes; pharynx without lesions noted NECK: Supple, no meningismus, no LAD  CARD: RRR; S1 and S2 appreciated; no murmurs, no clicks, no rubs, no gallops RESP: Normal chest excursion without splinting or tachypnea; breath sounds clear and equal bilaterally; no wheezes, no rhonchi, no rales, no hypoxia ABD/GI: Normal bowel sounds; non-distended; soft, very mildly tender to palpation in the left upper quadrant without guarding or rebound, no peritoneal signs BACK:  The back appears normal and is non-tender to palpation, there is no CVA tenderness EXT: Normal ROM in all joints; non-tender to palpation; no edema; normal capillary refill; no cyanosis    SKIN: Normal color for age and race; warm NEURO: Moves all extremities equally PSYCH: The patient's mood and manner are appropriate. Grooming and personal hygiene are appropriate.  MEDICAL DECISION MAKING: Patient here with left upper quadrant pain and  vomiting that he reports feels similar to his prior peptic ulcer. His abdominal exam is benign. Doubt perforation. Denies hematochezia, melena. He is hypertensive but this may be secondary to pain. He does have a history of hypertension and has been off medication for years. States he is taking Vicodin and oxycodone at home without relief. He does drink regularly as well as take NSAIDs although he knows he is not  supposed to. We'll give GI cocktail, Zofran, Protonix. Will obtain labs including LFTs and lipase. If workup unremarkable and symptoms controlled will discharge home with outpatient gastroenterology follow-up.  ED PROGRESS: Patient has mild elevation of his creatinine at 1.44. Have encouraged him to increase his fluid intake. Otherwise labs, LFTs, lipase, troponin unremarkable. Reports feeling better after Protonix, GI cocktail and Zofran. He is tolerating by mouth. Have strongly encouraged him to avoid NSAIDs, alcohol. Have recommended a bland diet. We'll give gastroenterology follow-up information. Discussed return precautions. He verbalizes understanding and is comfortable with plan.    When patient urinated he passed what appeared to be a kidney stone. Denies a prior history of kidney stones. Does have hematuria. Denies flank pain. Given he is not having pain in his artery on narcotics and do not feel he needs further medication for this. Have advised him to follow-up with his PCP. We'll also give urology follow-up information as needed.       EKG Interpretation  Date/Time:  Friday November 06 2014 13:07:51 EST Ventricular Rate:  68 PR Interval:  140 QRS Duration: 95 QT Interval:  386 QTC Calculation: 410 R Axis:   36 Text Interpretation:  Sinus rhythm Nonspecific T abnormalities, lateral leads No significant change since last tracing Confirmed by Rodolfo Notaro,  DO, Miroslav Gin [phone]) on 11/06/2014 1:10:25 PM        [place]ak[place], DO 11/06/14 1446

## 2015-10-05 DIAGNOSIS — G4733 Obstructive sleep apnea (adult) (pediatric): Secondary | ICD-10-CM | POA: Insufficient documentation

## 2015-10-18 ENCOUNTER — Emergency Department (HOSPITAL_COMMUNITY): Payer: PRIVATE HEALTH INSURANCE

## 2015-10-18 ENCOUNTER — Inpatient Hospital Stay (HOSPITAL_COMMUNITY)
Admission: EM | Admit: 2015-10-18 | Discharge: 2015-10-19 | DRG: 309 | Disposition: A | Discharge status: Home / Self Care | Payer: PRIVATE HEALTH INSURANCE | Attending: Cardiovascular Disease | Admitting: Cardiovascular Disease

## 2015-10-18 DIAGNOSIS — I10 Essential (primary) hypertension: Secondary | ICD-10-CM | POA: Diagnosis present

## 2015-10-18 DIAGNOSIS — I4892 Unspecified atrial flutter: Secondary | ICD-10-CM

## 2015-10-18 DIAGNOSIS — E669 Obesity, unspecified: Secondary | ICD-10-CM

## 2015-10-18 DIAGNOSIS — Z6832 Body mass index (BMI) 32.0-32.9, adult: Secondary | ICD-10-CM

## 2015-10-18 DIAGNOSIS — I248 Other forms of acute ischemic heart disease: Secondary | ICD-10-CM | POA: Diagnosis present

## 2015-10-18 DIAGNOSIS — Z8711 Personal history of peptic ulcer disease: Secondary | ICD-10-CM

## 2015-10-18 DIAGNOSIS — Z87891 Personal history of nicotine dependence: Secondary | ICD-10-CM

## 2015-10-18 DIAGNOSIS — R079 Chest pain, unspecified: Secondary | ICD-10-CM | POA: Diagnosis present

## 2015-10-18 DIAGNOSIS — G4733 Obstructive sleep apnea (adult) (pediatric): Secondary | ICD-10-CM | POA: Diagnosis present

## 2015-10-18 DIAGNOSIS — I4891 Unspecified atrial fibrillation: Secondary | ICD-10-CM | POA: Diagnosis present

## 2015-10-18 DIAGNOSIS — R001 Bradycardia, unspecified: Secondary | ICD-10-CM | POA: Diagnosis not present

## 2015-10-18 LAB — BASIC METABOLIC PANEL
Anion gap: 11 (ref 5–15)
BUN: 12 mg/dL (ref 6–20)
CO2: 26 mmol/L (ref 22–32)
CREATININE: 1.19 mg/dL (ref 0.61–1.24)
Calcium: 9.8 mg/dL (ref 8.9–10.3)
Chloride: 105 mmol/L (ref 101–111)
GFR calc Af Amer: >60 mL/min (ref >60)
GLUCOSE: 108 mg/dL — AB (ref 65–99)
Potassium: 4.1 mmol/L (ref 3.5–5.1)
SODIUM: 142 mmol/L (ref 135–145)

## 2015-10-18 LAB — CBC
HCT: 43.8 % (ref 39.0–52.0)
Hemoglobin: 14.7 g/dL (ref 13.0–17.0)
MCH: 27 pg (ref 26.0–34.0)
MCHC: 33.6 g/dL (ref 30.0–36.0)
MCV: 80.5 fL (ref 78.0–100.0)
Platelets: 252 10*3/uL (ref 150–400)
RBC: 5.44 MIL/uL (ref 4.22–5.81)
RDW: 16.4 % — AB (ref 11.5–15.5)
WBC: 6.6 10*3/uL (ref 4.0–10.5)

## 2015-10-18 LAB — I-STAT TROPONIN, ED: Troponin i, poc: 0.03 ng/mL (ref 0.00–0.08)

## 2015-10-18 LAB — TSH: TSH: 4.5 u[IU]/mL (ref 0.350–4.500)

## 2015-10-18 LAB — TROPONIN I
Troponin I: 0.06 ng/mL — ABNORMAL HIGH (ref <0.031)
Troponin I: 0.06 ng/mL — ABNORMAL HIGH (ref <0.031)

## 2015-10-18 LAB — MAGNESIUM: MAGNESIUM: 1.9 mg/dL (ref 1.7–2.4)

## 2015-10-18 MED ORDER — DILTIAZEM HCL 100 MG IV SOLR
5.0000 mg/h | Freq: Once | INTRAVENOUS | Status: DC
  Filled 2015-10-18: qty 100

## 2015-10-18 MED ORDER — ONDANSETRON HCL 4 MG/2ML IJ SOLN: 4.0000 mg | Freq: Four times a day (QID) | INTRAMUSCULAR | Status: DC | PRN

## 2015-10-18 MED ORDER — SUCRALFATE 1 G PO TABS: 1.0000 g | ORAL_TABLET | Freq: Three times a day (TID) | ORAL | Status: DC

## 2015-10-18 MED ORDER — PANTOPRAZOLE SODIUM 40 MG PO TBEC: 40.0000 mg | DELAYED_RELEASE_TABLET | Freq: Every day | ORAL | Status: DC

## 2015-10-18 MED ORDER — DEXTROSE 5 % IV SOLN
5.0000 mg/h | Freq: Once | INTRAVENOUS | Status: AC
  Administered 2015-10-18: 5 mg/h via INTRAVENOUS
  Filled 2015-10-18: qty 100

## 2015-10-18 MED ORDER — DILTIAZEM HCL 25 MG/5ML IV SOLN
20.0000 mg | Freq: Once | INTRAVENOUS | Status: AC
  Administered 2015-10-18: 20 mg via INTRAVENOUS
  Filled 2015-10-18: qty 5

## 2015-10-18 MED ORDER — ASPIRIN 81 MG PO CHEW
324.0000 mg | CHEWABLE_TABLET | Freq: Once | ORAL | Status: AC
  Administered 2015-10-18: 324 mg via ORAL
  Filled 2015-10-18: qty 4

## 2015-10-18 MED ORDER — NITROGLYCERIN 0.4 MG SL SUBL
0.4000 mg | SUBLINGUAL_TABLET | SUBLINGUAL | Status: DC | PRN
  Administered 2015-10-18: 0.4 mg via SUBLINGUAL
  Filled 2015-10-18: qty 1

## 2015-10-18 MED ORDER — DILTIAZEM LOAD VIA INFUSION
20.0000 mg | Freq: Once | INTRAVENOUS | Status: AC
  Administered 2015-10-18: 20 mg via INTRAVENOUS
  Filled 2015-10-18: qty 20

## 2015-10-18 MED ORDER — RIVAROXABAN 20 MG PO TABS
20.0000 mg | ORAL_TABLET | Freq: Every day | ORAL | Status: DC
  Administered 2015-10-19: 20 mg via ORAL
  Filled 2015-10-18: qty 1

## 2015-10-18 MED ORDER — ACETAMINOPHEN 325 MG PO TABS: 650.0000 mg | ORAL_TABLET | ORAL | Status: DC | PRN

## 2015-10-18 NOTE — ED Notes (Signed)
Admitting MD paged again  

## 2015-10-18 NOTE — H&P (Signed)
Patient ID: Samuel Flynn MRN: [id number], DOB/AGE: 42-20-1977   Admit date: 10/18/2015   Primary Physician: Marcial Pacas, DO Primary Cardiologist: New (Dr. Oval Linsey)  Pt. Profile:  40 y/o male with h/o HTN but no prior cardiac history presenting to ED with new onset atrial fibrillation w/ RVR.   Problem List  Past Medical History  Diagnosis Date  . Gout   . Hypertension     no meds  . Wears glasses   . Gastric ulcer     Past Surgical History  Procedure Laterality Date  . Wisdom tooth extraction    . Elbow ligament reconstruction Right 09/29/2014    Procedure: CORANOID FRACTURE FIXATION, POSSIBLE LIGAMENT REPAIR;  Surgeon: Nita Sells, MD;  Location: [facility];  Service: Orthopedics;  Laterality: Right;  Right elbow coranoid fracture fixation, possible ligament repair     Allergies  No Known Allergies  HPI  40 y/o male with h/o HTN but no prior cardiac history presenting to ED with symptoms of left sided chest pressure, diaphoresis and lightheadedness, found to be in new onset atrial fibrillation w/ a RVR in the 140s-150s. He reports that he has been bothered with similar symptoms for years but never to this extent. He denies any prior h/o CAD. No h/o stroke/TIA, DM or HLD. He reports that he recently underwent a sleep study and was told that he has OSA but he has not received a CPAP machien yet. His family history is notable for CAD in his father, both grandmothers and several uncles. He reports that they all had MIs but cannot recall ages of onset. He is a former smoker but quit 4 months ago.   He reports that he was in his usual state of health until earlier this morning when he developed left sided chest pressure, palpitations, diaphoresis and dizziness. He denies syncope/ near syncope. He denies any recent fever, chills, cough, n/v/d. No recent caffeine intake. He does report that he had 3 beers last PM, which is normal for him.   He  has been started on IV heparin in ED. CXR is unremarkable. CBC negative for anemia. WBC is WNL. BMP is unremarkable. K is 4.1. He is afebrile. BP is elevated in the ED at 164/124. He was given a 1 x bolus of 20 mg IV Cardizem and is now on drip. POC troponin is negative.    Home Medications  Prior to Admission medications   Medication Sig Start Date End Date Taking? Authorizing Provider  docusate sodium (COLACE) 100 MG capsule Take 1 capsule (100 mg total) by mouth 3 (three) times daily as needed. 09/29/14   Grier Mitts, PA-C  ibuprofen (ADVIL,MOTRIN) 200 MG tablet Take 400 mg by mouth every 6 (six) hours as needed for mild pain or moderate pain.    Historical Provider, MD  ondansetron (ZOFRAN ODT) 4 MG disintegrating tablet Take 1 tablet (4 mg total) by mouth every 8 (eight) hours as needed for nausea or vomiting. 11/06/14   Kristen N Ward, DO  oxyCODONE-acetaminophen (ROXICET) 5-325 MG per tablet Take 1-2 tablets by mouth every 4 (four) hours as needed for severe pain. 09/29/14   Grier Mitts, PA-C  pantoprazole (PROTONIX) 40 MG tablet Take 1 tablet (40 mg total) by mouth daily. 11/06/14   Kristen N Ward, DO  sucralfate (CARAFATE) 1 G tablet Take 1 tablet (1 g total) by mouth 4 (four) times daily -  with meals and at bedtime. 11/06/14   Schulter,  DO    Family History  Family History  Problem Relation Age of Onset  . Hypertension Paternal Uncle   . Hypertension Maternal Grandmother   . Hypertension Paternal Uncle   . Hypertension Paternal Uncle   . Hypertension Paternal Uncle   . Hypertension Paternal Uncle     Social History  Social History   Social History  . Marital Status: Married    Spouse Name: N/A  . Number of Children: N/A  . Years of Education: N/A   Occupational History  . Not on file.   Social History Main Topics  . Smoking status: Current Every Day Smoker    Types: Cigars  . Smokeless tobacco: Not on file  . Alcohol Use: Yes     Comment: occ  .  Drug Use: No  . Sexual Activity: Yes     Comment: 5 a day   Other Topics Concern  . Not on file   Social History Narrative     Review of Systems General:  No chills, fever, night sweats or weight changes.  Cardiovascular:  + chest pain, dyspnea on exertion,  No edema, orthopnea, + palpitations, no paroxysmal nocturnal dyspnea. Dermatological: No rash, lesions/masses Respiratory: No cough, dyspnea Urologic: No hematuria, dysuria Abdominal:   No nausea, vomiting, diarrhea, bright red blood per rectum, melena, or hematemesis Neurologic:  No visual changes, wkns, changes in mental status. All other systems reviewed and are otherwise negative except as noted above.  Physical Exam  Blood pressure 174/127, pulse 150, temperature 98 F (36.7 C), temperature source Oral, resp. rate 21, height 6\' 2"  (1.88 m), weight 250 lb (113.399 kg), SpO2 99 %.  General: Pleasant, NAD, moderately obese Psych: Normal affect. Neuro: Alert and oriented X 3. Moves all extremities spontaneously. HEENT: Normal  Neck: Supple without bruits or JVD. Lungs:  Resp regular and unlabored, CTA. Heart: irregular rhythm, tachy rate no s3, s4, or murmurs. Abdomen: Soft, non-tender, non-distended, BS + x 4.  Extremities: No clubbing, cyanosis or edema. DP/PT/Radials 2+ and equal bilaterally.  Labs  Troponin (Point of Care Test)  Recent Labs  10/18/15 1428  TROPIPOC 0.03   No results for input(s): CKTOTAL, CKMB, TROPONINI in the last 72 hours. Lab Results  Component Value Date   WBC 6.6 10/18/2015   HGB 14.7 10/18/2015   HCT 43.8 10/18/2015   MCV 80.5 10/18/2015   PLT 252 10/18/2015     Recent Labs Lab 10/18/15 1416  NA 142  K 4.1  CL 105  CO2 26  BUN 12  CREATININE 1.19  CALCIUM 9.8  GLUCOSE 108*   Lab Results  Component Value Date   CHOL 155 01/03/2007   HDL 30.1* 01/03/2007   LDLCALC 96 01/03/2007   TRIG 146 01/03/2007   Lab Results  Component Value Date   DDIMER  09/11/2007     0.29        AT THE INHOUSE ESTABLISHED CUTOFF VALUE OF 0.48 ug/mL FEU, THIS ASSAY HAS BEEN DOCUMENTED IN THE LITERATURE TO HAVE A SENSITIVITY AND NEGATIVE PREDICTIVE VALUE OF 100 PERCENT FOR DEEP VEIN THROMBOSIS AND PULMONARY EMBOLISM.     Radiology/Studies  Dg Chest Portable 1 View  10/18/2015  CLINICAL DATA:  Chest pain EXAM: PORTABLE CHEST 1 VIEW COMPARISON:  02/06/2014 chest radiograph. FINDINGS: Stable cardiomediastinal silhouette with normal heart size. No pneumothorax. No pleural effusion. Lungs appear clear, with no acute consolidative airspace disease and no pulmonary edema. IMPRESSION: No active disease. Electronically Signed   By: Ilona Sorrel  M.D.   On: 10/18/2015 15:08    ECG  Atrial flutter with RVR, rate in the 140s on EKG, currently in the 90s on telemetry.   ASSESSMENT AND PLAN  Principal Problem:   Atrial fibrillation with rapid ventricular response ([place])   1. Atrial fibrilation w/ RVR: HR is better controlled with IV Cardizem. He was given a 1X bolus of 20 mg IV at 1430 followed by IV drip. He is hypertensive in ED. We will continue IV Caredizem drip. If rate is difficult to control or no spontaneous conversion to NSR, can attempt TEE/DCCV for tomorrow. Will order Xarelto for a/c. His CHA2DS2 VASc score is at least 1 for HTN. His K is WNL but given reports of ETOH use, will check Mg level. Will also check TSH level. 2D echo at some point to assess LV function, once his rate is better controlled. Consider ischemic eval with NST. We cycle cardiac enzymes x 3.    Signed, Lyda Jester, PA-C 10/18/2015, 5:06 PM

## 2015-10-18 NOTE — ED Notes (Addendum)
provider paged as nitro was ineffective, cardizem at 15mg /hr.

## 2015-10-18 NOTE — ED Notes (Signed)
Pt states while walking into work today he became very diaphoretic and having some sharp chest pain and felt light headed. HR 190s. EKG done and given to MD.

## 2015-10-18 NOTE — ED Notes (Signed)
hockrein returned page, discussed need for stepdown order, and reviewed that ekg is available for rate change to 60s with slight elevation in lead 1. MD ackoweldges, asked for bed type to be input.

## 2015-10-18 NOTE — ED Notes (Signed)
Dr. Laneta Simmers looked at ekg. Advises to pass off to cardiologist.

## 2015-10-18 NOTE — ED Notes (Signed)
Admitting MD paged as patient is having chest pain 5/10 and troponin increased. Requesting bed type change.

## 2015-10-18 NOTE — ED Notes (Signed)
Spoke to Dr. Neysa Bonito, covering for Cardiology, bed type may be changed to stepdown and nitro tablets may be administered for chest pain at this time. Reviewed troponin has increased from 0.03 to 0.06 and patient cannot be transferred to telemetry floor with active chest pain. MD acknowledges, plan of care included change of bed type and nitro tabs.

## 2015-10-18 NOTE — ED Notes (Signed)
First nitro tab did not increase, but decreased chest pain, and heart rate increased to 120s in afib.

## 2015-10-18 NOTE — ED Notes (Signed)
Notified bed placement bed type to be changed to step down.

## 2015-10-18 NOTE — ED Provider Notes (Signed)
CSN: [id number]     Arrival date & time 10/18/15  96 History   First MD Initiated Contact with Patient 10/18/15 1421     Chief Complaint  Patient presents with  . Chest Pain     (Consider location/radiation/quality/duration/timing/severity/associated sxs/prior Treatment) HPI Comments: 40 year old male with obesity, atrial fibrillation history presents with chest pain lightheaded and fast heart rate. Patient has had this in the past. Patient's had intermittent chest pain worse for the past month and has stress test planned this week. Currently minimal chest pain, patient has palpitations. Patient denies bleeding. No current anticoagulants.  Patient is a 40 y.o. male presenting with chest pain. The history is provided by the patient.  Chest Pain Associated symptoms: diaphoresis and palpitations   Associated symptoms: no abdominal pain, no back pain, no fever, no headache, no shortness of breath and not vomiting     Past Medical History  Diagnosis Date  . Gout   . Hypertension     no meds  . Wears glasses   . Gastric ulcer    Past Surgical History  Procedure Laterality Date  . Wisdom tooth extraction    . Elbow ligament reconstruction Right 09/29/2014    Procedure: CORANOID FRACTURE FIXATION, POSSIBLE LIGAMENT REPAIR;  Surgeon: Nita Sells, MD;  Location: [facility];  Service: Orthopedics;  Laterality: Right;  Right elbow coranoid fracture fixation, possible ligament repair   Family History  Problem Relation Age of Onset  . Hypertension Paternal Uncle   . Hypertension Maternal Grandmother   . Hypertension Paternal Uncle   . Hypertension Paternal Uncle   . Hypertension Paternal Uncle   . Hypertension Paternal Uncle    Social History  Substance Use Topics  . Smoking status: Current Every Day Smoker    Types: Cigars  . Smokeless tobacco: Not on file  . Alcohol Use: Yes     Comment: occ    Review of Systems  Constitutional: Positive for  diaphoresis. Negative for fever and chills.  HENT: Negative for congestion.   Eyes: Negative for visual disturbance.  Respiratory: Negative for shortness of breath.   Cardiovascular: Positive for chest pain and palpitations.  Gastrointestinal: Negative for vomiting and abdominal pain.  Genitourinary: Negative for dysuria and flank pain.  Musculoskeletal: Negative for back pain, neck pain and neck stiffness.  Skin: Negative for rash.  Neurological: Negative for light-headedness and headaches.      Allergies  Review of patient's allergies indicates no known allergies.  Home Medications   Prior to Admission medications   Medication Sig Start Date End Date Taking? Authorizing Provider  docusate sodium (COLACE) 100 MG capsule Take 1 capsule (100 mg total) by mouth 3 (three) times daily as needed. 09/29/14   Grier Mitts, PA-C  ibuprofen (ADVIL,MOTRIN) 200 MG tablet Take 400 mg by mouth every 6 (six) hours as needed for mild pain or moderate pain.    Historical Provider, MD  ondansetron (ZOFRAN ODT) 4 MG disintegrating tablet Take 1 tablet (4 mg total) by mouth every 8 (eight) hours as needed for nausea or vomiting. 11/06/14   Kristen N Ward, DO  oxyCODONE-acetaminophen (ROXICET) 5-325 MG per tablet Take 1-2 tablets by mouth every 4 (four) hours as needed for severe pain. 09/29/14   Grier Mitts, PA-C  pantoprazole (PROTONIX) 40 MG tablet Take 1 tablet (40 mg total) by mouth daily. 11/06/14   Kristen N Ward, DO  sucralfate (CARAFATE) 1 G tablet Take 1 tablet (1 g total) by mouth 4 (four) times  daily -  with meals and at bedtime. 11/06/14   Kristen N Ward, DO   BP 153/101 mmHg  Pulse 102  Temp(Src) 98 F (36.7 C) (Oral)  Resp 20  Ht 6\' 2"  (1.88 m)  Wt 250 lb (113.399 kg)  BMI 32.08 kg/m2  SpO2 98% Physical Exam  Constitutional: He is oriented to person, place, and time. He appears well-developed and well-nourished.  HENT:  Head: Normocephalic and atraumatic.  Eyes: Conjunctivae  are normal. Right eye exhibits no discharge. Left eye exhibits no discharge.  Neck: Normal range of motion. Neck supple. No tracheal deviation present.  Cardiovascular: An irregularly irregular rhythm present. Tachycardia present.   Pulmonary/Chest: Effort normal and breath sounds normal.  Abdominal: Soft. He exhibits no distension. There is no tenderness. There is no guarding.  Musculoskeletal: He exhibits no edema.  Neurological: He is alert and oriented to person, place, and time.  Skin: Skin is warm. No rash noted.  Psychiatric: He has a normal mood and affect.  Nursing note and vitals reviewed.   ED Course  Procedures (including critical care time) CRITICAL CARE Performed by: Mariea Clonts   Total critical care time: 35 minutes  Critical care time was exclusive of separately billable procedures and treating other patients.  Critical care was necessary to treat or prevent imminent or life-threatening deterioration.  Critical care was time spent personally by me on the following activities: development of treatment plan with patient and/or surrogate as well as nursing, discussions with consultants, evaluation of patient's response to treatment, examination of patient, obtaining history from patient or surrogate, ordering and performing treatments and interventions, ordering and review of laboratory studies, ordering and review of radiographic studies, pulse oximetry and re-evaluation of patient's condition.  Labs Review Labs Reviewed  BASIC METABOLIC PANEL - Abnormal; Notable for the following:    Glucose, Bld 108 (*)    All other components within normal limits  CBC - Abnormal; Notable for the following:    RDW 16.4 (*)    All other components within normal limits  I-STAT TROPOININ, ED    Imaging Review Dg Chest Portable 1 View  10/18/2015  CLINICAL DATA:  Chest pain EXAM: PORTABLE CHEST 1 VIEW COMPARISON:  02/06/2014 chest radiograph. FINDINGS: Stable cardiomediastinal  silhouette with normal heart size. No pneumothorax. No pleural effusion. Lungs appear clear, with no acute consolidative airspace disease and no pulmonary edema. IMPRESSION: No active disease. Electronically Signed   By: Ilona Sorrel M.D.   On: 10/18/2015 15:08   I have personally reviewed and evaluated these images and lab results as part of my medical decision-making.   EKG Interpretation   Date/Time:  Monday October 18 2015 14:10:59 EST Ventricular Rate:  189 PR Interval:    QRS Duration: 82 QT Interval:  234 QTC Calculation: 414 R Axis:   45 Text Interpretation:  Atrial fibrillation with rapid ventricular response  Marked ST abnormality, possible lateral subendocardial injury Abnormal ECG  Confirmed by Takeysha Bonk  MD, Maytte Jacot ([id number]) on 10/18/2015 2:21:29 PM      MDM   Final diagnoses:  Acute chest pain  Atrial fibrillation with RVR ([place])   Patient presents with worsening chest pain and atrial fibrillation RVR. Chest pain improved in the ER  Patient had mild chest pain on arrival. Aspirin given. EKG reviewed concerning for age or fibrillation in ST changes worse laterally.  Discussed with cardiology who will assess the patient and the ER and plan to admit.  The patients results and plan were  reviewed and discussed.   Any x-rays performed were independently reviewed by myself.   Differential diagnosis were considered with the presenting HPI.  Medications  aspirin chewable tablet 324 mg (324 mg Oral Given 10/18/15 1435)  diltiazem (CARDIZEM) 100 mg in dextrose 5 % 100 mL (1 mg/mL) infusion (10 mg/hr Intravenous Rate/Dose Change 10/18/15 1609)  diltiazem (CARDIZEM) 1 mg/mL load via infusion 20 mg (20 mg Intravenous Given 10/18/15 1436)    Filed Vitals:   10/18/15 1515 10/18/15 1530 10/18/15 1545 10/18/15 1600  BP: 172/115 165/111 147/120 164/124  Pulse: 81 142 147 145  Temp:      TempSrc:      Resp: 20 21 19 17   Height:      Weight:      SpO2: 98% 98% 97% 99%    Final  diagnoses:  Acute chest pain  Atrial fibrillation with RVR (HCC)    Admission/ observation were discussed with the admitting physician, patient and/or family and they are comfortable with the plan.      Elnora Morrison, MD 10/18/15 [phone]

## 2015-10-18 NOTE — ED Notes (Signed)
Still awaiting return page from provider.

## 2015-10-18 NOTE — ED Notes (Signed)
EKG given to EDP.  

## 2015-10-18 NOTE — ED Notes (Signed)
Paged admit doctor regarding patient chest pain and troponin.

## 2015-10-19 ENCOUNTER — Encounter (HOSPITAL_COMMUNITY): Payer: Self-pay

## 2015-10-19 ENCOUNTER — Inpatient Hospital Stay (HOSPITAL_COMMUNITY): Payer: PRIVATE HEALTH INSURANCE

## 2015-10-19 DIAGNOSIS — I4891 Unspecified atrial fibrillation: Secondary | ICD-10-CM

## 2015-10-19 LAB — LIPID PANEL
CHOL/HDL RATIO: 6.6 ratio
CHOLESTEROL: 211 mg/dL — AB (ref 0–200)
HDL: 32 mg/dL — AB (ref >40)
LDL CALC: 124 mg/dL — AB (ref 0–99)
TRIGLYCERIDES: 273 mg/dL — AB (ref <150)
VLDL: 55 mg/dL — AB (ref 0–40)

## 2015-10-19 LAB — BASIC METABOLIC PANEL
ANION GAP: 13 (ref 5–15)
BUN: 12 mg/dL (ref 6–20)
CALCIUM: 8.8 mg/dL — AB (ref 8.9–10.3)
CO2: 24 mmol/L (ref 22–32)
CREATININE: 1.1 mg/dL (ref 0.61–1.24)
Chloride: 104 mmol/L (ref 101–111)
GFR calc Af Amer: >60 mL/min (ref >60)
GLUCOSE: 100 mg/dL — AB (ref 65–99)
Potassium: 3.8 mmol/L (ref 3.5–5.1)
Sodium: 141 mmol/L (ref 135–145)

## 2015-10-19 LAB — TROPONIN I: TROPONIN I: 0.05 ng/mL — AB (ref <0.031)

## 2015-10-19 LAB — CBC
HCT: 41.3 % (ref 39.0–52.0)
HEMOGLOBIN: 13.6 g/dL (ref 13.0–17.0)
MCH: 26.5 pg (ref 26.0–34.0)
MCHC: 32.9 g/dL (ref 30.0–36.0)
MCV: 80.5 fL (ref 78.0–100.0)
PLATELETS: 232 10*3/uL (ref 150–400)
RBC: 5.13 MIL/uL (ref 4.22–5.81)
RDW: 16.7 % — AB (ref 11.5–15.5)
WBC: 5.9 10*3/uL (ref 4.0–10.5)

## 2015-10-19 LAB — MRSA PCR SCREENING: MRSA by PCR: NEGATIVE

## 2015-10-19 MED ORDER — LISINOPRIL 10 MG PO TABS: 10.0000 mg | ORAL_TABLET | Freq: Every day | ORAL | Status: DC

## 2015-10-19 MED ORDER — RIVAROXABAN 20 MG PO TABS: 20.0000 mg | ORAL_TABLET | Freq: Every day | ORAL | Status: DC

## 2015-10-19 MED ORDER — DILTIAZEM HCL 30 MG PO TABS: 30.0000 mg | ORAL_TABLET | Freq: Four times a day (QID) | ORAL | Status: DC

## 2015-10-19 MED ORDER — PANTOPRAZOLE SODIUM 40 MG PO TBEC: 40.0000 mg | DELAYED_RELEASE_TABLET | Freq: Every day | ORAL | Status: DC

## 2015-10-19 MED ORDER — OFF THE BEAT BOOK
Freq: Once | Status: AC
  Administered 2015-10-19: 1
  Filled 2015-10-19: qty 1

## 2015-10-19 MED ORDER — METOPROLOL TARTRATE 25 MG PO TABS: 25.0000 mg | ORAL_TABLET | Freq: Two times a day (BID) | ORAL | Status: DC

## 2015-10-19 MED ORDER — METOPROLOL TARTRATE 25 MG PO TABS
25.0000 mg | ORAL_TABLET | Freq: Two times a day (BID) | ORAL | Status: DC
  Administered 2015-10-19: 25 mg via ORAL
  Filled 2015-10-19: qty 1

## 2015-10-19 NOTE — Discharge Instructions (Signed)

## 2015-10-19 NOTE — Discharge Summary (Signed)
Discharge Summary    Patient ID: Samuel Flynn,  MRN: [id number], DOB/AGE: [birth year] 40 y.o.  Admit date: 10/18/2015 Discharge date: 10/19/2015  Primary Care Provider: Marcial Pacas Primary Cardiologist: Dr. Oval Linsey  Discharge Diagnoses    Principal Problem:   Atrial fibrillation with rapid ventricular response [facility])   Allergies No Known Allergies  Diagnostic Studies/Procedures    2D Echo 10/19/15 Study Conclusions  - Left ventricle: The cavity size was normal. There was mild focal basal hypertrophy of the septum. Systolic function was normal. The estimated ejection fraction was in the range of 60% to 65%. Wall motion was normal; there were no regional wall motion abnormalities. - Mitral valve: Calcified annulus.  History of Present Illness     40 y/o male with h/o HTN, obesity and newly diagnosed OSA (not yet on CPAP), who presented to [facility] ED on 10/18/15 with complaint of palpitations and near syncope. In the ED, he was found to be in new onset atrial fibrillation w/ a RVR in the 140s-150s. He was hemodynamically stable in ED. He was admitted for rate control and cardiac w/u.   Hospital Course      Patient was placed on IV Cardizem and admitted to stepdown. His CHA2DS2 VASc score was calculated to be 1 for HTN. However he was placed on Xarelto, given plans to perform TEE cardioversion if no spontaneous conversion. Overnight, he converted to NSR on IV Cardizem. He developed sinus bradycardia with a rate in the 40s, thus Cardizem was discontinued. He was later placed on 25 mg of Metoprolol BID and heart rthythm and rate remained stable/ well controlled. TSH and electrolytes were all WNL. Cardiac enzymes were cycled and were mildly elevated but with flat low-level trend, suspected to be from demand ischemia from his rapid afib. 2D echo was obtained which showed normal EF of 60-65% w/o WMA. CXR was w/o edema of infiltrate. His symptoms resolved with treatment of his  arrhthymia. Given initiation of metoprolol for HR, his amlodipine was discontinue and his lisinopril was decreased to 20 mg daily, to avoid hypotension. He was last seen and examined by Dr. Oval Linsey who determined he was stable for discharge home. She recommended further outpatient assessment and likely stress test. He is scheduled for f/u with Dr. Oval Linsey on 11/03/15.   Consultants: none    Discharge Vitals Blood pressure 143/79, pulse 62, temperature 98 F (36.7 C), temperature source Oral, resp. rate 17, height 6\' 2"  (1.88 m), weight 250 lb (113.399 kg), SpO2 97 %.  Filed Weights   10/18/15 1416  Weight: 250 lb (113.399 kg)    Labs & Radiologic Studies     CBC  Recent Labs  10/18/15 1416 10/19/15 0500  WBC 6.6 5.9  HGB 14.7 13.6  HCT 43.8 41.3  MCV 80.5 80.5  PLT 252 [id number]   Basic Metabolic Panel  Recent Labs  10/18/15 1416 10/18/15 2054 10/19/15 0500  NA 142  --  141  K 4.1  --  3.8  CL 105  --  104  CO2 26  --  24  GLUCOSE 108*  --  100*  BUN 12  --  12  CREATININE 1.19  --  1.10  CALCIUM 9.8  --  8.8*  MG  --  1.9  --    Liver Function Tests No results for input(s): AST, ALT, ALKPHOS, BILITOT, PROT, ALBUMIN in the last 72 hours. No results for input(s): LIPASE, AMYLASE in the last 72 hours. Cardiac Enzymes  Recent  Labs  10/18/15 1813 10/18/15 2251 10/19/15 0500  TROPONINI 0.06* 0.06* 0.05*   BNP Invalid input(s): POCBNP D-Dimer No results for input(s): DDIMER in the last 72 hours. Hemoglobin A1C No results for input(s): HGBA1C in the last 72 hours. Fasting Lipid Panel  Recent Labs  10/19/15 0500  CHOL 211*  HDL 32*  LDLCALC 124*  TRIG 273*  CHOLHDL 6.6   Thyroid Function Tests  Recent Labs  10/18/15 2054  TSH 4.500    Dg Chest Portable 1 View  10/18/2015  CLINICAL DATA:  Chest pain EXAM: PORTABLE CHEST 1 VIEW COMPARISON:  02/06/2014 chest radiograph. FINDINGS: Stable cardiomediastinal silhouette with normal heart size. No  pneumothorax. No pleural effusion. Lungs appear clear, with no acute consolidative airspace disease and no pulmonary edema. IMPRESSION: No active disease. Electronically Signed   By: Ilona Sorrel M.D.   On: 10/18/2015 15:08    Disposition   Pt is being discharged home today in good condition.  Follow-up Plans & Appointments    Follow-up Information    Follow up with Sharol Harness, MD.   Specialty:  Cardiology   Why:  our office will call you with a follow-up appointment with cardiology   Contact information:   [address] [place] [address] [phone]      Discharge Instructions    Diet - low sodium heart healthy    Complete by:  As directed      Increase activity slowly    Complete by:  As directed            Discharge Medications   Current Discharge Medication List    START taking these medications   Details  metoprolol tartrate (LOPRESSOR) 25 MG tablet Take 1 tablet (25 mg total) by mouth 2 (two) times daily. Qty: 60 tablet, Refills: 5    !! rivaroxaban (XARELTO) 20 MG TABS tablet Take 1 tablet (20 mg total) by mouth daily with supper. Qty: 30 tablet, Refills: 5    !! rivaroxaban (XARELTO) 20 MG TABS tablet Take 1 tablet (20 mg total) by mouth daily with supper. Qty: 30 tablet, Refills: 0     !! - Potential duplicate medications found. Please discuss with provider.    CONTINUE these medications which have CHANGED   Details  lisinopril (PRINIVIL,ZESTRIL) 10 MG tablet Take 1 tablet (10 mg total) by mouth daily. Qty: 30 tablet, Refills: 5    pantoprazole (PROTONIX) 40 MG tablet Take 1 tablet (40 mg total) by mouth daily. Qty: 30 tablet, Refills: 2      CONTINUE these medications which have NOT CHANGED   Details  colchicine 0.6 MG tablet Take 0.6 mg by mouth daily as needed (gout flare).  Refills: 1    docusate sodium (COLACE) 100 MG capsule Take 1 capsule (100 mg total) by mouth 3 (three) times daily as needed. Qty: 20 capsule,  Refills: 0    ondansetron (ZOFRAN ODT) 4 MG disintegrating tablet Take 1 tablet (4 mg total) by mouth every 8 (eight) hours as needed for nausea or vomiting. Qty: 20 tablet, Refills: 0    oxyCODONE-acetaminophen (ROXICET) 5-325 MG per tablet Take 1-2 tablets by mouth every 4 (four) hours as needed for severe pain. Qty: 60 tablet, Refills: 0    sucralfate (CARAFATE) 1 G tablet Take 1 tablet (1 g total) by mouth 4 (four) times daily -  with meals and at bedtime. Qty: 120 tablet, Refills: 1      STOP taking these medications  amLODipine (NORVASC) 10 MG tablet      ibuprofen (ADVIL,MOTRIN) 200 MG tablet            Outstanding Labs/Studies   Outpatient stress test.   Duration of Discharge Encounter   Greater than 30 minutes including physician time.  Alveta Heimlich, Eaven Schwager NP 10/19/2015, 4:56 PM

## 2015-10-19 NOTE — Progress Notes (Signed)
Utilization review completed. Saryn Cherry, RN, BSN. 

## 2015-10-19 NOTE — Progress Notes (Signed)
Echocardiogram 2D Echocardiogram has been performed.  Tresa Res 10/19/2015, 3:26 PM

## 2015-10-19 NOTE — Progress Notes (Signed)
cardizem gtt on hold as pt HR in lower 40's sinus Samuel Flynn. Rec'd orders from [facility]  Cardiology Ellen Henri. Will cont to monitor pt closely

## 2015-10-19 NOTE — Progress Notes (Signed)
PATIENT ID: Samuel Flynn is a 42M with hypertension and OSA here with new onset atrial fibrillation.   INTERVAL HISTORY: Converted to sinus rhythm around 8:30 pm.  SUBJECTIVE: Feeling well.   PHYSICAL EXAM Filed Vitals:   10/19/15 0600 10/19/15 0602 10/19/15 0625 10/19/15 0800  BP: 114/54   122/64  Pulse:      Temp:    98.6 F (37 C)  TempSrc:      Resp: 14 14 17 13   Height:      Weight:      SpO2: 94% 95% 92% 99%   General:  Well-appearing.  No acute distress. Neck: No JVD. Lungs:  CTAB. Heart:  RRR.  No m/r/g.  Normal S1/S2. Abdomen:  Soft, NT, ND.  +BS Extremities:  No edema.  WWP.  2+ DP/PT bilaterally.   LABS: Lab Results  Component Value Date   TROPONINI 0.05* 10/19/2015   Results for orders placed or performed during the hospital encounter of 10/18/15 (from the past 24 hour(s))  Basic metabolic panel     Status: Abnormal   Collection Time: 10/18/15  2:16 PM  Result Value Ref Range   Sodium 142 135 - 145 mmol/L   Potassium 4.1 3.5 - 5.1 mmol/L   Chloride 105 101 - 111 mmol/L   CO2 26 22 - 32 mmol/L   Glucose, Bld 108 (H) 65 - 99 mg/dL   BUN 12 6 - 20 mg/dL   Creatinine, Ser 1.19 0.61 - 1.24 mg/dL   Calcium 9.8 8.9 - 10.3 mg/dL   GFR calc non Af Amer >60 >60 mL/min   GFR calc Af Amer >60 >60 mL/min   Anion gap 11 5 - 15  CBC     Status: Abnormal   Collection Time: 10/18/15  2:16 PM  Result Value Ref Range   WBC 6.6 4.0 - 10.5 K/uL   RBC 5.44 4.22 - 5.81 MIL/uL   Hemoglobin 14.7 13.0 - 17.0 g/dL   HCT 43.8 39.0 - 52.0 %   MCV 80.5 78.0 - 100.0 fL   MCH 27.0 26.0 - 34.0 pg   MCHC 33.6 30.0 - 36.0 g/dL   RDW 16.4 (H) 11.5 - 15.5 %   Platelets 252 150 - 400 K/uL  I-stat troponin, ED (not at [facility], [facility])     Status: None   Collection Time: 10/18/15  2:28 PM  Result Value Ref Range   Troponin i, poc 0.03 0.00 - 0.08 ng/mL   Comment 3          Troponin I     Status: Abnormal   Collection Time: 10/18/15  6:13 PM  Result Value Ref Range   Troponin I  0.06 (H) <0.031 ng/mL  Magnesium     Status: None   Collection Time: 10/18/15  8:54 PM  Result Value Ref Range   Magnesium 1.9 1.7 - 2.4 mg/dL  TSH     Status: None   Collection Time: 10/18/15  8:54 PM  Result Value Ref Range   TSH 4.500 0.350 - 4.500 uIU/mL  Troponin I     Status: Abnormal   Collection Time: 10/18/15 10:51 PM  Result Value Ref Range   Troponin I 0.06 (H) <0.031 ng/mL  MRSA PCR Screening     Status: None   Collection Time: 10/18/15 11:23 PM  Result Value Ref Range   MRSA by PCR NEGATIVE NEGATIVE  Troponin I     Status: Abnormal   Collection Time: 10/19/15  5:00 AM  Result  Value Ref Range   Troponin I 0.05 (H) <0.031 ng/mL  Basic metabolic panel     Status: Abnormal   Collection Time: 10/19/15  5:00 AM  Result Value Ref Range   Sodium 141 135 - 145 mmol/L   Potassium 3.8 3.5 - 5.1 mmol/L   Chloride 104 101 - 111 mmol/L   CO2 24 22 - 32 mmol/L   Glucose, Bld 100 (H) 65 - 99 mg/dL   BUN 12 6 - 20 mg/dL   Creatinine, Ser 1.10 0.61 - 1.24 mg/dL   Calcium 8.8 (L) 8.9 - 10.3 mg/dL   GFR calc non Af Amer >60 >60 mL/min   GFR calc Af Amer >60 >60 mL/min   Anion gap 13 5 - 15  CBC     Status: Abnormal   Collection Time: 10/19/15  5:00 AM  Result Value Ref Range   WBC 5.9 4.0 - 10.5 K/uL   RBC 5.13 4.22 - 5.81 MIL/uL   Hemoglobin 13.6 13.0 - 17.0 g/dL   HCT 41.3 39.0 - 52.0 %   MCV 80.5 78.0 - 100.0 fL   MCH 26.5 26.0 - 34.0 pg   MCHC 32.9 30.0 - 36.0 g/dL   RDW 16.7 (H) 11.5 - 15.5 %   Platelets 232 150 - 400 K/uL  Lipid panel     Status: Abnormal   Collection Time: 10/19/15  5:00 AM  Result Value Ref Range   Cholesterol 211 (H) 0 - 200 mg/dL   Triglycerides 273 (H) <150 mg/dL   HDL 32 (L) >40 mg/dL   Total CHOL/HDL Ratio 6.6 RATIO   VLDL 55 (H) 0 - 40 mg/dL   LDL Cholesterol 124 (H) 0 - 99 mg/dL    Intake/Output Summary (Last 24 hours) at 10/19/15 1004 Last data filed at 10/19/15 0700  Gross per 24 hour  Intake     70 ml  Output   2225 ml  Net   -2155 ml    Telemetry: Sinus bradycardia since converting from atrial fibrillation.  ASSESSMENT AND PLAN:  Principal Problem:   Atrial fibrillation with rapid ventricular response (HCC)   # Atrial fibrillation with RVR: Now in sinus bradycardia after converting on diltiazem infusion.  Will start po diltiazem once heart rate settles.   Echo pending.  Thyroid function and electrolytes unremarkable.  Continue Xarelto for anticoagulation.  CHA2D2-Vasc is 1 for hypertension.  Discussed the importance of getting a CPAP machine for his sleep apnea.  # Demand ischemia: Troponin mildly elevated.  Likely demand ischemia given lack of chest pain.  His heart rate was as high as the 180s.  Will plan for outpatient assessment and likely stress.  No aspirin given that we are starting Xarelto.  Echo pending.  # OSA: CPAP as above.  # Hypertension: BP poorly-controlled.  Will start metoprolol 25 mg bid for both heart rate and BP.   Time spent: 25 minutes-Greater than 50% of this time was spent in counseling, explanation of diagnosis, planning of further management, and coordination of care.    Jayvien Rowlette C. Oval Linsey, MD, [facility] 10/19/2015 10:04 AM

## 2015-10-21 LAB — HEMOGLOBIN A1C
Hgb A1c MFr Bld: 5.7 % — ABNORMAL HIGH (ref 4.8–5.6)
Mean Plasma Glucose: 117 mg/dL

## 2015-11-02 NOTE — Progress Notes (Signed)
Cardiology Office Note   Date:  11/03/2015   ID:  [name], DOB 03/23/1976, MRN [id number]  PCP:  Marcial Pacas, DO  Cardiologist:   Sharol Harness, MD   Chief Complaint  Patient presents with  . Follow-up    occassional chest pain, occassional shortness of breath, no edema, no pain or cramping in legs, has lightheadedness and dizziness      History of Present Illness: Samuel Flynn is a 40 y.o. male with hypertension, obesity, untreated sleep apnea and paroxysmal atrial fibrillation who presents for follow up on atrial fibrillation.  [name] was seen in the ED 10/18/15 with new-onset atrial fibrillation with RVR.  He was started on a diltiazem infusion and converted spontaneously back to sinus rhythm.  He was started on Xarelto for anticoagulation.  TSH and electrolytes were unremarkable.  Echo revealed LVEF [id number] without diastolic dysfunction. Troponin was mildly elevated at 0.06.  His home amlodipine was discontinued and metoprolol was started while lisionpril was continued.  Since being discharged [name] has felt fatigued.  He also notes chest pain in his mid to right chest that occurs twice daily.  It lasts for 5-6 minutes daily.  It occurs at rest and not with exertion, though he has not been exercising lately.  He does not get regular exercise.  He notes that his heart skips approximately once daily but it does not last for long.  There is no associated lightheadedness or dizziness and it is not a severe as when he went to the hospital.  He denies lower extremity edema or orthopnea.  He does endorse waking up gasping for air.  He was diagnosed with sleep apnea but is unable to afford the machine as there is a $800 copay.   Past Medical History  Diagnosis Date  . Gout   . Hypertension     no meds  . Wears glasses   . Gastric ulcer     Past Surgical History  Procedure Laterality Date  . Wisdom tooth extraction    . Elbow ligament reconstruction  Right 09/29/2014    Procedure: CORANOID FRACTURE FIXATION, POSSIBLE LIGAMENT REPAIR;  Surgeon: Nita Sells, MD;  Location: [facility];  Service: Orthopedics;  Laterality: Right;  Right elbow coranoid fracture fixation, possible ligament repair     Current Outpatient Prescriptions  Medication Sig Dispense Refill  . colchicine 0.6 MG tablet Take 0.6 mg by mouth daily as needed (gout flare).   1  . docusate sodium (COLACE) 100 MG capsule Take 1 capsule (100 mg total) by mouth 3 (three) times daily as needed. 20 capsule 0  . lisinopril (PRINIVIL,ZESTRIL) 20 MG tablet Take 1 tablet by mouth daily. Take 1 tab by mouth daily    . ondansetron (ZOFRAN ODT) 4 MG disintegrating tablet Take 1 tablet (4 mg total) by mouth every 8 (eight) hours as needed for nausea or vomiting. 20 tablet 0  . pantoprazole (PROTONIX) 40 MG tablet Take 1 tablet (40 mg total) by mouth daily. 30 tablet 2  . rivaroxaban (XARELTO) 20 MG TABS tablet Take 1 tablet (20 mg total) by mouth daily with supper. 30 tablet 5  . sucralfate (CARAFATE) 1 G tablet Take 1 tablet (1 g total) by mouth 4 (four) times daily -  with meals and at bedtime. 120 tablet 1  . diltiazem (CARDIZEM CD) 120 MG 24 hr capsule Take 1 capsule (120 mg total) by mouth daily. 30 capsule 11   No  current facility-administered medications for this visit.    Allergies:   Review of patient's allergies indicates no known allergies.    Social History:  The patient  reports that he has quit smoking. His smoking use included Cigars. He has never used smokeless tobacco. He reports that he drinks alcohol. He reports that he does not use illicit drugs.   Family History:  The patient's family history includes Hypertension in his maternal grandmother, paternal uncle, paternal uncle, paternal uncle, paternal uncle, and paternal uncle.    ROS:  Please see the history of present illness.   Otherwise, review of systems are positive for none.   All other  systems are reviewed and negative.    PHYSICAL EXAM: VS:  BP 130/72 mmHg  Pulse 56  Ht 6\' 2"  (1.88 m)  Wt 123.378 kg (272 lb)  BMI 34.91 kg/m2 , BMI Body mass index is 34.91 kg/(m^2). GENERAL:  Well appearing HEENT:  Pupils equal round and reactive, fundi not visualized, oral mucosa unremarkable NECK:  No jugular venous distention, waveform within normal limits, carotid upstroke brisk and symmetric, no bruits, no thyromegaly LYMPHATICS:  No cervical adenopathy LUNGS:  Clear to auscultation bilaterally HEART:  RRR.  PMI not displaced or sustained,S1 and S2 within normal limits, no S3, no S4, no clicks, no rubs, no murmurs ABD:  Flat, positive bowel sounds normal in frequency in pitch, no bruits, no rebound, no guarding, no midline pulsatile mass, no hepatomegaly, no splenomegaly EXT:  2 plus pulses throughout, no edema, no cyanosis no clubbing SKIN:  No rashes no nodules NEURO:  Cranial nerves II through XII grossly intact, motor grossly intact throughout PSYCH:  Cognitively intact, oriented to person place and time    EKG:  EKG is ordered today. The ekg ordered today demonstrates sinus bradycardia rate 56 bpm.     Recent Labs: 11/06/2014: ALT 14 10/18/2015: Magnesium 1.9; TSH 4.500 10/19/2015: BUN 12; Creatinine, Ser 1.10; Hemoglobin 13.6; Platelets 232; Potassium 3.8; Sodium 141    Lipid Panel    Component Value Date/Time   CHOL 211* 10/19/2015 0500   TRIG 273* 10/19/2015 0500   HDL 32* 10/19/2015 0500   CHOLHDL 6.6 10/19/2015 0500   VLDL 55* 10/19/2015 0500   LDLCALC 124* 10/19/2015 0500      Wt Readings from Last 3 Encounters:  11/03/15 123.378 kg (272 lb)  10/18/15 113.399 kg (250 lb)  11/06/14 116.574 kg (257 lb)      ASSESSMENT AND PLAN:  # Atrial fibrillation: [name] is currently in sinus rhythm.  I suspect that his fatigue may be related to the metoprolol.  We will try switching to diltiazem 120 mg daily. He has obesity and untreated sleep apnea which  may be the trigger, as his heart was otherwise unremarkable on echo.  This patien'ts CHA2DS2-VASc Score and unadjusted Ischemic Stroke Rate (% per year) is equal to 0.6 % stroke rate/year from a score of 1  Above score calculated as 1 point each if present [CHF, HTN, DM, Vascular=MI/PAD/Aortic Plaque, Age if 65-74, or Male] Above score calculated as 2 points each if present [Age > 75, or Stroke/TIA/TE]   # Hypertension: BP well-controlled on metoprolol and lisinopril.  We will switch metoprolol to diltiazem as above.  #  Chest pain: Mr. Mckinnies symptoms are concerning, especially given his elevated troponin in the hospital.  This was likely due to demand ischemia in the setting of atrial fibrillation with RVR.  We will obtain an exercise Cardiolite to evaluate for  ischemia.  He will need to hold his nodal agent prior to exercise.  # Obesity: Mr. Asia was encouraged to increase his exercise to at least 30 minutes most days of the week.  First, we will obtain a stress test as above.  # OSA: Pt encouraged to get CPAP when financially feasible.    Current medicines are reviewed at length with the patient today.  The patient does not have concerns regarding medicines.  The following changes have been made:  Stop metoprolol.  Start diltiazem.   Labs/ tests ordered today include:   Orders Placed This Encounter  Procedures  . Myocardial Perfusion Imaging  . EKG 12-Lead     Disposition:   FU with Moriya Mitchell C. Oval Linsey, MD, [facility] in 1 month.    This note was written with the assistance of speech recognition software.  Please excuse any transcriptional errors.  Signed, Andray Assefa C. Oval Linsey, MD, [facility]  11/03/2015 9:28 AM    [facility]

## 2015-11-03 ENCOUNTER — Ambulatory Visit (INDEPENDENT_AMBULATORY_CARE_PROVIDER_SITE_OTHER): Payer: PRIVATE HEALTH INSURANCE | Admitting: Cardiovascular Disease

## 2015-11-03 ENCOUNTER — Encounter: Payer: Self-pay | Admitting: Cardiovascular Disease

## 2015-11-03 VITALS — BP 130/72 | HR 56 | Ht 74.0 in | Wt 272.0 lb

## 2015-11-03 DIAGNOSIS — R079 Chest pain, unspecified: Secondary | ICD-10-CM

## 2015-11-03 DIAGNOSIS — G4730 Sleep apnea, unspecified: Secondary | ICD-10-CM

## 2015-11-03 DIAGNOSIS — R0602 Shortness of breath: Secondary | ICD-10-CM | POA: Diagnosis not present

## 2015-11-03 DIAGNOSIS — I119 Hypertensive heart disease without heart failure: Secondary | ICD-10-CM | POA: Diagnosis not present

## 2015-11-03 DIAGNOSIS — I480 Paroxysmal atrial fibrillation: Secondary | ICD-10-CM | POA: Diagnosis not present

## 2015-11-03 DIAGNOSIS — E669 Obesity, unspecified: Secondary | ICD-10-CM

## 2015-11-03 DIAGNOSIS — I2089 Other forms of angina pectoris: Secondary | ICD-10-CM

## 2015-11-03 DIAGNOSIS — I208 Other forms of angina pectoris: Secondary | ICD-10-CM

## 2015-11-03 MED ORDER — DILTIAZEM HCL ER COATED BEADS 120 MG PO CP24: 120.0000 mg | ORAL_CAPSULE | Freq: Every day | ORAL | Status: DC

## 2015-11-03 NOTE — Patient Instructions (Signed)
Medication Instructions: Dr Oval Linsey has recommended making the following medication changes: STOP Metoprolol START Diltiazem 120 mg - take 1 tablet by mouth daily  >>A new prescription has been sent to your pharmacy electronically  Labwork: NONE  Testing/Procedures: 1. Lexiscan Cardiolite stress test - Your physician has requested that you have a lexiscan myoview. For further information please visit [email or web address]. Please follow instruction sheet, as given.  Follow-up: Dr Oval Linsey recommends that you schedule a follow-up appointment in 1 month.  If you need a refill on your cardiac medications before your next appointment, please call your pharmacy.

## 2015-11-09 ENCOUNTER — Telehealth (HOSPITAL_COMMUNITY): Payer: Self-pay

## 2015-11-09 NOTE — Telephone Encounter (Signed)
Encounter complete. 

## 2015-11-11 ENCOUNTER — Ambulatory Visit (HOSPITAL_COMMUNITY)
Admission: RE | Admit: 2015-11-11 | Discharge: 2015-11-11 | Disposition: A | Discharge status: Home / Self Care | Payer: PRIVATE HEALTH INSURANCE | Source: Ambulatory Visit | Attending: Cardiology | Admitting: Cardiology

## 2015-11-11 DIAGNOSIS — I10 Essential (primary) hypertension: Secondary | ICD-10-CM | POA: Diagnosis not present

## 2015-11-11 DIAGNOSIS — R079 Chest pain, unspecified: Secondary | ICD-10-CM

## 2015-11-11 DIAGNOSIS — R5383 Other fatigue: Secondary | ICD-10-CM | POA: Insufficient documentation

## 2015-11-11 DIAGNOSIS — Z8249 Family history of ischemic heart disease and other diseases of the circulatory system: Secondary | ICD-10-CM | POA: Diagnosis not present

## 2015-11-11 DIAGNOSIS — R0602 Shortness of breath: Secondary | ICD-10-CM | POA: Diagnosis not present

## 2015-11-11 DIAGNOSIS — Z87891 Personal history of nicotine dependence: Secondary | ICD-10-CM | POA: Diagnosis not present

## 2015-11-11 DIAGNOSIS — E663 Overweight: Secondary | ICD-10-CM | POA: Insufficient documentation

## 2015-11-11 DIAGNOSIS — G4733 Obstructive sleep apnea (adult) (pediatric): Secondary | ICD-10-CM | POA: Insufficient documentation

## 2015-11-11 DIAGNOSIS — R0609 Other forms of dyspnea: Secondary | ICD-10-CM | POA: Diagnosis not present

## 2015-11-11 DIAGNOSIS — Z6835 Body mass index (BMI) 35.0-35.9, adult: Secondary | ICD-10-CM | POA: Diagnosis not present

## 2015-11-11 DIAGNOSIS — R42 Dizziness and giddiness: Secondary | ICD-10-CM | POA: Diagnosis not present

## 2015-11-11 LAB — MYOCARDIAL PERFUSION IMAGING
CHL CUP NUCLEAR SDS: 1
CHL CUP NUCLEAR SRS: 0
CHL CUP NUCLEAR SSS: 1
CHL CUP RESTING HR STRESS: 50 {beats}/min
LV sys vol: 75 mL
LVDIAVOL: 157 mL (ref 62–150)
NUC STRESS TID: 1.19
Peak HR: 62 {beats}/min

## 2015-11-11 MED ORDER — TECHNETIUM TC 99M SESTAMIBI GENERIC - CARDIOLITE
32.0000 | Freq: Once | INTRAVENOUS | Status: AC | PRN
  Administered 2015-11-11: 32 via INTRAVENOUS

## 2015-11-11 MED ORDER — REGADENOSON 0.4 MG/5ML IV SOLN
0.4000 mg | Freq: Once | INTRAVENOUS | Status: AC
  Administered 2015-11-11: 0.4 mg via INTRAVENOUS

## 2015-11-11 MED ORDER — TECHNETIUM TC 99M SESTAMIBI GENERIC - CARDIOLITE
10.8000 | Freq: Once | INTRAVENOUS | Status: AC | PRN
  Administered 2015-11-11: 11 via INTRAVENOUS

## 2015-11-30 ENCOUNTER — Other Ambulatory Visit: Payer: Self-pay | Admitting: Cardiology

## 2015-12-06 ENCOUNTER — Ambulatory Visit: Payer: PRIVATE HEALTH INSURANCE | Admitting: Cardiovascular Disease

## 2015-12-09 ENCOUNTER — Encounter: Payer: PRIVATE HEALTH INSURANCE | Admitting: Cardiovascular Disease

## 2015-12-09 NOTE — Progress Notes (Signed)
Cardiology Office Note   Date:  12/09/2015   ID:  Samuel Flynn, DOB March 20, 1976, MRN [id number]  PCP:  Samuel Pacas, DO  Cardiologist:   Samuel Harness, MD   No chief complaint on file.   Patient ID:  Samuel Flynn is a 40 y.o. male with hypertension, obesity, untreated sleep apnea and paroxysmal atrial fibrillation who presents for follow up on atrial fibrillation.   Interval History 12/09/15: After his last appointment Samuel Flynn had a Lexiscan Cardiolite that was negative for ischemia.  Metoprolol was switched to diltiazem due to fatuge.  Exercise? CPAP  History of Present Illness 11/03/15:  Samuel Flynn was seen in the ED 10/18/15 with new-onset atrial fibrillation with RVR.  He was started on a diltiazem infusion and converted spontaneously back to sinus rhythm.  He was started on Xarelto for anticoagulation.  TSH and electrolytes were unremarkable.  Echo revealed LVEF [id number] without diastolic dysfunction. Troponin was mildly elevated at 0.06.  His home amlodipine was discontinued and metoprolol was started while lisionpril was continued.  Since being discharged Samuel Flynn has felt fatigued.  He also notes chest pain in his mid to right chest that occurs twice daily.  It lasts for 5-6 minutes daily.  It occurs at rest and not with exertion, though he has not been exercising lately.  He does not get regular exercise.  He notes that his heart skips approximately once daily but it does not last for long.  There is no associated lightheadedness or dizziness and it is not a severe as when he went to the hospital.  He denies lower extremity edema or orthopnea.  He does endorse waking up gasping for air.  He was diagnosed with sleep apnea but is unable to afford the machine as there is a $800 copay.   Past Medical History  Diagnosis Date  . Gout   . Hypertension     no meds  . Wears glasses   . Gastric ulcer     Past Surgical History  Procedure Laterality Date  .  Wisdom tooth extraction    . Elbow ligament reconstruction Right 09/29/2014    Procedure: CORANOID FRACTURE FIXATION, POSSIBLE LIGAMENT REPAIR;  Surgeon: Samuel Sells, MD;  Location: [place];  Service: Orthopedics;  Laterality: Right;  Right elbow coranoid fracture fixation, possible ligament repair     Current Outpatient Prescriptions  Medication Sig Dispense Refill  . colchicine 0.6 MG tablet Take 0.6 mg by mouth daily as needed (gout flare).   1  . diltiazem (CARDIZEM CD) 120 MG 24 hr capsule Take 1 capsule (120 mg total) by mouth daily. 30 capsule 11  . docusate sodium (COLACE) 100 MG capsule Take 1 capsule (100 mg total) by mouth 3 (three) times daily as needed. 20 capsule 0  . lisinopril (PRINIVIL,ZESTRIL) 20 MG tablet Take 1 tablet by mouth daily. Take 1 tab by mouth daily    . ondansetron (ZOFRAN ODT) 4 MG disintegrating tablet Take 1 tablet (4 mg total) by mouth every 8 (eight) hours as needed for nausea or vomiting. 20 tablet 0  . pantoprazole (PROTONIX) 40 MG tablet Take 1 tablet (40 mg total) by mouth daily. 30 tablet 2  . rivaroxaban (XARELTO) 20 MG TABS tablet Take 1 tablet (20 mg total) by mouth daily with supper. 30 tablet 5  . sucralfate (CARAFATE) 1 G tablet Take 1 tablet (1 g total) by mouth 4 (four) times daily -  with meals and  at bedtime. 120 tablet 1  . XARELTO 20 MG TABS tablet TAKE 1 TABLET (20 MG TOTAL) BY MOUTH DAILY WITH SUPPER. 30 tablet 6   No current facility-administered medications for this visit.    Allergies:   Review of patient's allergies indicates no known allergies.    Social History:  The patient  reports that he has quit smoking. His smoking use included Cigars. He has never used smokeless tobacco. He reports that he drinks alcohol. He reports that he does not use illicit drugs.   Family History:  The patient's family history includes Hypertension in his maternal grandmother, paternal uncle, paternal uncle, paternal uncle,  paternal uncle, and paternal uncle.    ROS:  Please see the history of present illness.   Otherwise, review of systems are positive for none.   All other systems are reviewed and negative.    PHYSICAL EXAM: VS:  There were no vitals taken for this visit. , BMI There is no weight on file to calculate BMI. GENERAL:  Well appearing HEENT:  Pupils equal round and reactive, fundi not visualized, oral mucosa unremarkable NECK:  No jugular venous distention, waveform within normal limits, carotid upstroke brisk and symmetric, no bruits, no thyromegaly LYMPHATICS:  No cervical adenopathy LUNGS:  Clear to auscultation bilaterally HEART:  RRR.  PMI not displaced or sustained,S1 and S2 within normal limits, no S3, no S4, no clicks, no rubs, no murmurs ABD:  Flat, positive bowel sounds normal in frequency in pitch, no bruits, no rebound, no guarding, no midline pulsatile mass, no hepatomegaly, no splenomegaly EXT:  2 plus pulses throughout, no edema, no cyanosis no clubbing SKIN:  No rashes no nodules NEURO:  Cranial nerves II through XII grossly intact, motor grossly intact throughout PSYCH:  Cognitively intact, oriented to person place and time    EKG:  EKG is ordered today. The ekg ordered today demonstrates sinus bradycardia rate 56 bpm.    Lexiscan Cardiolite 11/11/15:  The left ventricular ejection fraction is mildly decreased (45-54%).  Nuclear stress EF: 52%.  There was no ST segment deviation noted during stress.  No T wave inversion was noted during stress.  The study is normal.  This is a low risk study   Recent Labs: 10/18/2015: Magnesium 1.9; TSH 4.500 10/19/2015: BUN 12; Creatinine, Ser 1.10; Hemoglobin 13.6; Platelets 232; Potassium 3.8; Sodium 141    Lipid Panel    Component Value Date/Time   CHOL 211* 10/19/2015 0500   TRIG 273* 10/19/2015 0500   HDL 32* 10/19/2015 0500   CHOLHDL 6.6 10/19/2015 0500   VLDL 55* 10/19/2015 0500   LDLCALC 124* 10/19/2015 0500        Wt Readings from Last 3 Encounters:  11/11/15 123.378 kg (272 lb)  11/03/15 123.378 kg (272 lb)  10/18/15 113.399 kg (250 lb)      ASSESSMENT AND PLAN:  # Atrial fibrillation: Samuel Flynn is currently in sinus rhythm.  I suspect that his fatigue may be related to the metoprolol.  We will try switching to diltiazem 120 mg daily. He has obesity and untreated sleep apnea which may be the trigger, as his heart was otherwise unremarkable on echo.  This patien'ts CHA2DS2-VASc Score and unadjusted Ischemic Stroke Rate (% per year) is equal to 0.6 % stroke rate/year from a score of 1  Above score calculated as 1 point each if present [CHF, HTN, DM, Vascular=MI/PAD/Aortic Plaque, Age if 65-74, or Male] Above score calculated as 2 points each if present [Age >  75, or Stroke/TIA/TE]   # Hypertension: BP well-controlled on metoprolol and lisinopril.  We will switch metoprolol to diltiazem as above.  #  Chest pain: Mr. Fullard symptoms are concerning, especially given his elevated troponin in the hospital.  This was likely due to demand ischemia in the setting of atrial fibrillation with RVR.  We will obtain an exercise Cardiolite to evaluate for ischemia.  He will need to hold his nodal agent prior to exercise.  # Obesity: Mr. Serafino was encouraged to increase his exercise to at least 30 minutes most days of the week.  First, we will obtain a stress test as above.  # OSA: Pt encouraged to get CPAP when financially feasible.    Current medicines are reviewed at length with the patient today.  The patient does not have concerns regarding medicines.  The following changes have been made:  Stop metoprolol.  Start diltiazem.   Labs/ tests ordered today include:   No orders of the defined types were placed in this encounter.     Disposition:   FU with Trany Chernick C. Oval Linsey, MD, [facility] in 1 month.    This note was written with the assistance of speech recognition software.  Please excuse any  transcriptional errors.  Signed, Selvin Yun C. Oval Linsey, MD, [facility]  12/09/2015 1:06 PM    [facility] This encounter was created in error - please disregard.

## 2015-12-13 ENCOUNTER — Encounter: Payer: Self-pay | Admitting: *Deleted

## 2016-01-31 ENCOUNTER — Other Ambulatory Visit: Payer: Self-pay | Admitting: Cardiology

## 2016-02-01 NOTE — Telephone Encounter (Signed)
pantoprazole (PROTONIX) 40 MG tablet  Medication   Date: 10/19/2015  Department: [facility] 2C STEPDOWN  Ordering/Authorizing: Consuelo Pandy, PA-C      Order Providers    Prescribing Provider Encounter Provider   Brittainy Erie Noe, PA-C None    Medication Detail      Disp Refills Start End     pantoprazole (PROTONIX) 40 MG tablet 30 tablet 2 10/19/2015     Sig - Route: Take 1 tablet (40 mg total) by mouth daily. - Oral    Class: Print     Pharmacy    [id number] - [place], [place]

## 2016-02-08 ENCOUNTER — Other Ambulatory Visit: Payer: Self-pay

## 2016-02-08 MED ORDER — PANTOPRAZOLE SODIUM 40 MG PO TBEC: 40.0000 mg | DELAYED_RELEASE_TABLET | Freq: Every day | ORAL | Status: DC

## 2016-02-15 NOTE — Telephone Encounter (Signed)
PCP?

## 2016-03-09 ENCOUNTER — Other Ambulatory Visit: Payer: Self-pay

## 2016-03-14 ENCOUNTER — Other Ambulatory Visit: Payer: Self-pay

## 2016-03-14 MED ORDER — PANTOPRAZOLE SODIUM 40 MG PO TBEC: 40.0000 mg | DELAYED_RELEASE_TABLET | Freq: Every day | ORAL | Status: DC

## 2016-04-10 ENCOUNTER — Encounter (HOSPITAL_COMMUNITY): Payer: Self-pay | Admitting: Emergency Medicine

## 2016-04-10 ENCOUNTER — Inpatient Hospital Stay (HOSPITAL_COMMUNITY)
Admission: EM | Admit: 2016-04-10 | Discharge: 2016-04-14 | DRG: 308 | Disposition: A | Discharge status: Home / Self Care | Payer: PRIVATE HEALTH INSURANCE | Attending: Internal Medicine | Admitting: Internal Medicine

## 2016-04-10 ENCOUNTER — Emergency Department (HOSPITAL_COMMUNITY): Payer: PRIVATE HEALTH INSURANCE

## 2016-04-10 DIAGNOSIS — N179 Acute kidney failure, unspecified: Secondary | ICD-10-CM | POA: Diagnosis not present

## 2016-04-10 DIAGNOSIS — I110 Hypertensive heart disease with heart failure: Secondary | ICD-10-CM | POA: Diagnosis present

## 2016-04-10 DIAGNOSIS — Z8249 Family history of ischemic heart disease and other diseases of the circulatory system: Secondary | ICD-10-CM

## 2016-04-10 DIAGNOSIS — Z6835 Body mass index (BMI) 35.0-35.9, adult: Secondary | ICD-10-CM

## 2016-04-10 DIAGNOSIS — I248 Other forms of acute ischemic heart disease: Secondary | ICD-10-CM | POA: Diagnosis present

## 2016-04-10 DIAGNOSIS — Z8711 Personal history of peptic ulcer disease: Secondary | ICD-10-CM

## 2016-04-10 DIAGNOSIS — M109 Gout, unspecified: Secondary | ICD-10-CM | POA: Diagnosis present

## 2016-04-10 DIAGNOSIS — I5031 Acute diastolic (congestive) heart failure: Secondary | ICD-10-CM | POA: Diagnosis not present

## 2016-04-10 DIAGNOSIS — G4733 Obstructive sleep apnea (adult) (pediatric): Secondary | ICD-10-CM | POA: Diagnosis present

## 2016-04-10 DIAGNOSIS — M79671 Pain in right foot: Secondary | ICD-10-CM

## 2016-04-10 DIAGNOSIS — Z7901 Long term (current) use of anticoagulants: Secondary | ICD-10-CM

## 2016-04-10 DIAGNOSIS — Z79899 Other long term (current) drug therapy: Secondary | ICD-10-CM

## 2016-04-10 DIAGNOSIS — E669 Obesity, unspecified: Secondary | ICD-10-CM | POA: Diagnosis present

## 2016-04-10 DIAGNOSIS — I4891 Unspecified atrial fibrillation: Secondary | ICD-10-CM | POA: Diagnosis not present

## 2016-04-10 DIAGNOSIS — R7989 Other specified abnormal findings of blood chemistry: Secondary | ICD-10-CM

## 2016-04-10 DIAGNOSIS — I10 Essential (primary) hypertension: Secondary | ICD-10-CM | POA: Diagnosis present

## 2016-04-10 DIAGNOSIS — I480 Paroxysmal atrial fibrillation: Principal | ICD-10-CM | POA: Diagnosis present

## 2016-04-10 DIAGNOSIS — Z87891 Personal history of nicotine dependence: Secondary | ICD-10-CM

## 2016-04-10 DIAGNOSIS — R079 Chest pain, unspecified: Secondary | ICD-10-CM | POA: Diagnosis not present

## 2016-04-10 DIAGNOSIS — L03115 Cellulitis of right lower limb: Secondary | ICD-10-CM | POA: Diagnosis present

## 2016-04-10 HISTORY — DX: Sleep apnea, unspecified: G47.30

## 2016-04-10 HISTORY — DX: Paroxysmal atrial fibrillation: I48.0

## 2016-04-10 HISTORY — DX: Unspecified atrial fibrillation: I48.91

## 2016-04-10 LAB — COMPREHENSIVE METABOLIC PANEL
ALT: 31 U/L (ref 17–63)
ANION GAP: 7 (ref 5–15)
AST: 21 U/L (ref 15–41)
Albumin: 3.6 g/dL (ref 3.5–5.0)
Alkaline Phosphatase: 74 U/L (ref 38–126)
BUN: 14 mg/dL (ref 6–20)
CHLORIDE: 107 mmol/L (ref 101–111)
CO2: 24 mmol/L (ref 22–32)
Calcium: 8.6 mg/dL — ABNORMAL LOW (ref 8.9–10.3)
Creatinine, Ser: 1.5 mg/dL — ABNORMAL HIGH (ref 0.61–1.24)
GFR calc non Af Amer: 56 mL/min — ABNORMAL LOW (ref >60)
Glucose, Bld: 107 mg/dL — ABNORMAL HIGH (ref 65–99)
Potassium: 4.2 mmol/L (ref 3.5–5.1)
SODIUM: 138 mmol/L (ref 135–145)
Total Bilirubin: 2.2 mg/dL — ABNORMAL HIGH (ref 0.3–1.2)
Total Protein: 7.1 g/dL (ref 6.5–8.1)

## 2016-04-10 LAB — CBC WITH DIFFERENTIAL/PLATELET
BASOS PCT: 0 %
Basophils Absolute: 0 10*3/uL (ref 0.0–0.1)
EOS ABS: 0 10*3/uL (ref 0.0–0.7)
Eosinophils Relative: 0 %
HEMATOCRIT: 38 % — AB (ref 39.0–52.0)
Hemoglobin: 12.4 g/dL — ABNORMAL LOW (ref 13.0–17.0)
LYMPHS ABS: 0.8 10*3/uL (ref 0.7–4.0)
Lymphocytes Relative: 6 %
MCH: 27.5 pg (ref 26.0–34.0)
MCHC: 32.6 g/dL (ref 30.0–36.0)
MCV: 84.3 fL (ref 78.0–100.0)
MONO ABS: 0.5 10*3/uL (ref 0.1–1.0)
MONOS PCT: 4 %
NEUTROS ABS: 11.6 10*3/uL — AB (ref 1.7–7.7)
Neutrophils Relative %: 90 %
Platelets: 197 10*3/uL (ref 150–400)
RBC: 4.51 MIL/uL (ref 4.22–5.81)
RDW: 18 % — AB (ref 11.5–15.5)
WBC: 12.9 10*3/uL — ABNORMAL HIGH (ref 4.0–10.5)

## 2016-04-10 LAB — TSH: TSH: 2.04 u[IU]/mL (ref 0.350–4.500)

## 2016-04-10 LAB — BRAIN NATRIURETIC PEPTIDE: B Natriuretic Peptide: 143.9 pg/mL — ABNORMAL HIGH (ref 0.0–100.0)

## 2016-04-10 LAB — TROPONIN I: Troponin I: 0.15 ng/mL (ref <0.03)

## 2016-04-10 LAB — MAGNESIUM: Magnesium: 2.2 mg/dL (ref 1.7–2.4)

## 2016-04-10 LAB — T4, FREE: FREE T4: 0.9 ng/dL (ref 0.61–1.12)

## 2016-04-10 LAB — I-STAT CG4 LACTIC ACID, ED: Lactic Acid, Venous: 1.6 mmol/L (ref 0.5–1.9)

## 2016-04-10 LAB — MRSA PCR SCREENING: MRSA by PCR: NEGATIVE

## 2016-04-10 MED ORDER — ALLOPURINOL 100 MG PO TABS
100.0000 mg | ORAL_TABLET | Freq: Every day | ORAL | Status: DC
  Administered 2016-04-10 – 2016-04-14 (×5): 100 mg via ORAL
  Filled 2016-04-10 (×5): qty 1

## 2016-04-10 MED ORDER — LISINOPRIL 20 MG PO TABS
20.0000 mg | ORAL_TABLET | Freq: Every day | ORAL | Status: DC
  Administered 2016-04-11 – 2016-04-14 (×4): 20 mg via ORAL
  Filled 2016-04-10 (×4): qty 1

## 2016-04-10 MED ORDER — AMLODIPINE BESYLATE 10 MG PO TABS
10.0000 mg | ORAL_TABLET | Freq: Every day | ORAL | Status: DC
  Administered 2016-04-10: 10 mg via ORAL
  Filled 2016-04-10: qty 1

## 2016-04-10 MED ORDER — CEFAZOLIN SODIUM-DEXTROSE 2-4 GM/100ML-% IV SOLN
2.0000 g | Freq: Three times a day (TID) | INTRAVENOUS | Status: DC
  Administered 2016-04-10 – 2016-04-14 (×12): 2 g via INTRAVENOUS
  Filled 2016-04-10 (×15): qty 100

## 2016-04-10 MED ORDER — SODIUM CHLORIDE 0.9 % IV BOLUS (SEPSIS)
1000.0000 mL | Freq: Once | INTRAVENOUS | Status: AC
  Administered 2016-04-10: 1000 mL via INTRAVENOUS

## 2016-04-10 MED ORDER — DEXTROSE 5 % IV SOLN
5.0000 mg/h | Freq: Once | INTRAVENOUS | Status: AC
  Administered 2016-04-10: 15 mg/h via INTRAVENOUS

## 2016-04-10 MED ORDER — DILTIAZEM HCL-DEXTROSE 100-5 MG/100ML-% IV SOLN (PREMIX)
5.0000 mg/h | INTRAVENOUS | Status: DC
  Administered 2016-04-10: 15 mg/h via INTRAVENOUS
  Filled 2016-04-10 (×2): qty 100

## 2016-04-10 MED ORDER — METOPROLOL SUCCINATE ER 50 MG PO TB24
50.0000 mg | ORAL_TABLET | Freq: Every day | ORAL | Status: DC
  Administered 2016-04-10 – 2016-04-11 (×2): 50 mg via ORAL
  Filled 2016-04-10 (×2): qty 2
  Filled 2016-04-10: qty 1

## 2016-04-10 MED ORDER — VANCOMYCIN HCL IN DEXTROSE 1-5 GM/200ML-% IV SOLN
1000.0000 mg | Freq: Once | INTRAVENOUS | Status: AC
Start: 2016-04-10 — End: 2016-04-10
  Administered 2016-04-10: 1000 mg via INTRAVENOUS
  Filled 2016-04-10: qty 200

## 2016-04-10 MED ORDER — HYDROCODONE-ACETAMINOPHEN 5-325 MG PO TABS
1.0000 | ORAL_TABLET | Freq: Four times a day (QID) | ORAL | Status: DC | PRN
  Administered 2016-04-11: 2 via ORAL
  Filled 2016-04-10: qty 2

## 2016-04-10 MED ORDER — POLYETHYLENE GLYCOL 3350 17 G PO PACK
17.0000 g | PACK | Freq: Every day | ORAL | Status: DC | PRN
  Filled 2016-04-10: qty 1

## 2016-04-10 MED ORDER — RIVAROXABAN 20 MG PO TABS
20.0000 mg | ORAL_TABLET | Freq: Every day | ORAL | Status: DC
  Administered 2016-04-10 – 2016-04-13 (×4): 20 mg via ORAL
  Filled 2016-04-10 (×6): qty 1

## 2016-04-10 MED ORDER — ACETAMINOPHEN 650 MG RE SUPP: 650.0000 mg | Freq: Four times a day (QID) | RECTAL | Status: DC | PRN

## 2016-04-10 MED ORDER — COLCHICINE 0.6 MG PO TABS: 0.6000 mg | ORAL_TABLET | Freq: Every day | ORAL | Status: DC | PRN

## 2016-04-10 MED ORDER — HYDROMORPHONE HCL 1 MG/ML IJ SOLN
1.0000 mg | INTRAMUSCULAR | Status: DC | PRN
  Administered 2016-04-10 – 2016-04-11 (×7): 1 mg via INTRAVENOUS
  Filled 2016-04-10 (×7): qty 1

## 2016-04-10 MED ORDER — BISACODYL 5 MG PO TBEC
5.0000 mg | DELAYED_RELEASE_TABLET | Freq: Every day | ORAL | Status: DC | PRN
  Administered 2016-04-13: 5 mg via ORAL
  Filled 2016-04-10 (×2): qty 1

## 2016-04-10 MED ORDER — ACETAMINOPHEN 500 MG PO TABS
1000.0000 mg | ORAL_TABLET | Freq: Once | ORAL | Status: AC
  Administered 2016-04-10: 1000 mg via ORAL
  Filled 2016-04-10: qty 2

## 2016-04-10 MED ORDER — ACETAMINOPHEN 325 MG PO TABS: 650.0000 mg | ORAL_TABLET | Freq: Four times a day (QID) | ORAL | Status: DC | PRN

## 2016-04-10 MED ORDER — SODIUM CHLORIDE 0.9% FLUSH
3.0000 mL | Freq: Two times a day (BID) | INTRAVENOUS | Status: DC
  Administered 2016-04-10 – 2016-04-14 (×6): 3 mL via INTRAVENOUS

## 2016-04-10 MED ORDER — DILTIAZEM HCL 60 MG PO TABS
60.0000 mg | ORAL_TABLET | Freq: Three times a day (TID) | ORAL | Status: DC
  Administered 2016-04-10: 60 mg via ORAL
  Filled 2016-04-10: qty 1

## 2016-04-10 MED ORDER — PANTOPRAZOLE SODIUM 40 MG PO TBEC
40.0000 mg | DELAYED_RELEASE_TABLET | Freq: Every day | ORAL | Status: DC
  Administered 2016-04-10 – 2016-04-14 (×5): 40 mg via ORAL
  Filled 2016-04-10 (×5): qty 1

## 2016-04-10 MED ORDER — ONDANSETRON HCL 4 MG PO TABS: 4.0000 mg | ORAL_TABLET | Freq: Four times a day (QID) | ORAL | Status: DC | PRN

## 2016-04-10 MED ORDER — HYDROMORPHONE HCL 1 MG/ML IJ SOLN
1.0000 mg | Freq: Once | INTRAMUSCULAR | Status: AC
  Administered 2016-04-10: 1 mg via INTRAVENOUS
  Filled 2016-04-10: qty 1

## 2016-04-10 MED ORDER — DILTIAZEM HCL 100 MG IV SOLR
5.0000 mg/h | Freq: Once | INTRAVENOUS | Status: AC
  Administered 2016-04-10: 5 mg/h via INTRAVENOUS
  Filled 2016-04-10: qty 100

## 2016-04-10 MED ORDER — ONDANSETRON HCL 4 MG/2ML IJ SOLN: 4.0000 mg | Freq: Four times a day (QID) | INTRAMUSCULAR | Status: DC | PRN

## 2016-04-10 NOTE — H&P (Signed)
History and Physical    Samuel Flynn [id number] DOB: [birth year] DOA: 04/10/2016  PCP: Marcial Pacas, DO  Patient coming from:   Home    Chief Complaint: rapid heartrate, bilateral leg swelling / pain  HPI: Samuel Flynn is a 40 y.o. male with a medical history significant for, but not  limited to,  atrial fibrillation with RVR diagnosed in February of this year, OSA,  hypertension, and obesity. Presents to the emergency department early this morning with bilateral lower extremity swelling and pain, shortness of breath and rapid heart rate. Patient has history of gout, takes daily Zyloprim. Approximate 2 weeks ago he began having pain in the top of his right foot. He's had intermittent swelling of his right lower extremity over the last couple of weeks. 2 days ago the right foot became markedly swollen and erythematous. He was unable to bear weight on the foot because of pain. Fevers and chills at home over the last couple of days. Similar symptoms in July but symptoms improved with gout medication and ibuprofen. No trauma to the right lower extremity. Today patient tells me that his heart started"acting out". Girlfriend noticed that patient was coughing which he often does when in atrial fibrillation. Patient also noticed that his heart rate was fast and irregular. Two weeks ago patient described swollen uvula which resolved with ibuprofen  ED Course:  MAXIMUM TEMPERATURE 100.3 3, heart rate130-140, normotensive. O2 saturation normal nasal cannula Creatinine 1.5, baseline normal 1.1.  Bili 2.2, LFTs otherwise unremarkable magnesium and potassium WNL. TSH, free T4 WNL, troponin 0.15 BNP 143 , wbc 12 point 9, hemoglobin 12.4, lactic acid normal.  Blood cultures pending EKG reveals atrial fibrillation, heart rate 151, QTC 447 No acute findings on chest x-ray  IV vanco, Cardizem gtt, liter bolus, tylenol and dilaudid  Review of Systems: As per HPI, otherwise 10 point review of systems  negative.    Past Medical History:  Diagnosis Date  . Gastric ulcer   . Gout   . Hypertension   . Paroxysmal a-fib ([place])   . Wears glasses     Past Surgical History:  Procedure Laterality Date  . ELBOW LIGAMENT RECONSTRUCTION Right 09/29/2014   Procedure: CORANOID FRACTURE FIXATION, POSSIBLE LIGAMENT REPAIR;  Surgeon: Nita Sells, MD;  Location: [facility];  Service: Orthopedics;  Laterality: Right;  Right elbow coranoid fracture fixation, possible ligament repair  . WISDOM TOOTH EXTRACTION      Social History   Social History  . Marital status: Married    Spouse name: N/A  . Number of children: N/A  . Years of education: N/A   Occupational History  . Not on file.   Social History Main Topics  . Smoking status: Former Smoker    Types: Cigars  . Smokeless tobacco: Never Used  . Alcohol use 0.0 oz/week     Comment: occ  . Drug use: No  . Sexual activity: Yes     Comment: 5 a day   Other Topics Concern  . Not on file   Social History Narrative  . No narrative on file   Patient lives at home with his girlfriend. Using crutches over the last couple of days because of pain in right foot No Known Allergies  Family History  Problem Relation Age of Onset  . Hypertension Paternal Uncle   . Hypertension Maternal Grandmother   . Hypertension Paternal Uncle   . Hypertension Paternal Uncle   . Hypertension Paternal Uncle   .  Hypertension Paternal Uncle     Prior to Admission medications   Medication Sig Start Date End Date Taking? Authorizing Provider  allopurinol (ZYLOPRIM) 100 MG tablet Take 100 mg by mouth daily. For gout   Yes Historical Provider, MD  amLODipine (NORVASC) 10 MG tablet Take 10 mg by mouth daily. For blood pressure   Yes Historical Provider, MD  colchicine 0.6 MG tablet Take 0.6 mg by mouth daily as needed (gout flare).  08/20/15  Yes Historical Provider, MD  ibuprofen (ADVIL,MOTRIN) 200 MG tablet Take 800 mg by mouth  every 6 (six) hours as needed for headache or moderate pain.   Yes Historical Provider, MD  lisinopril (PRINIVIL,ZESTRIL) 20 MG tablet Take 20 mg by mouth daily. For blood pressure 10/07/15  Yes Historical Provider, MD  metoprolol succinate (TOPROL-XL) 50 MG 24 hr tablet Take 50 mg by mouth daily. For high blood pressure and chest pain. Take with or immediately following a meal.   Yes Historical Provider, MD  pantoprazole (PROTONIX) 40 MG tablet Take 1 tablet (40 mg total) by mouth daily. Patient taking differently: Take 40 mg by mouth daily. For acid reflux or heartburn 03/14/16  Yes Skeet Latch, MD  rivaroxaban (XARELTO) 20 MG TABS tablet Take 1 tablet (20 mg total) by mouth daily with supper. 10/19/15  Yes Brittainy Erie Noe, PA-C  diltiazem (CARDIZEM CD) 120 MG 24 hr capsule Take 1 capsule (120 mg total) by mouth daily. Patient not taking: Reported on 04/10/2016 11/03/15   Skeet Latch, MD  docusate sodium (COLACE) 100 MG capsule Take 1 capsule (100 mg total) by mouth 3 (three) times daily as needed. Patient not taking: Reported on 04/10/2016 09/29/14   Grier Mitts, PA-C    Physical Exam: Vitals:   04/10/16 0930 04/10/16 0945 04/10/16 1000 04/10/16 1015  BP: 114/80 114/72 124/88 124/80  Pulse: (!) 146 92 (!) 121 (!) 125  Resp: 25 20 20 19   Temp:      TempSrc:      SpO2: 99% 99% 97% 100%  Weight:        Constitutional:  pleaseant well developed white male in NAD, calm, comfortable Vitals:   04/10/16 0930 04/10/16 0945 04/10/16 1000 04/10/16 1015  BP: 114/80 114/72 124/88 124/80  Pulse: (!) 146 92 (!) 121 (!) 125  Resp: 25 20 20 19   Temp:      TempSrc:      SpO2: 99% 99% 97% 100%  Weight:       Eyes: PER, lids and conjunctivae normal ENMT: Mucous membranes are moist. Posterior pharynx clear of any exudate or lesions..  Neck: normal, supple, no masses Respiratory: clear to auscultation bilaterally, no wheezing, no crackles. Normal respiratory effort. No accessory muscle  use.  Cardiovascular: Regular rate and rhythm, no murmurs / rubs / gallops. No extremity edema. 2+ pedal pulses.   Abdomen: no tenderness, no masses palpated. No hepatomegaly. Bowel sounds positive.  Musculoskeletal: no clubbing / cyanosis. No joint deformity upper and lower extremities. Good ROM, no contractures. Normal muscle tone.  RLE:   Right foot warm with marked swelling, erythema. No obvious injuries Skin: no rashes, lesions, ulcers.  Neurologic: CN 2-12 grossly intact. Sensation intact, Strength 5/5 in all 4.  Psychiatric: Normal judgment and insight. Alert and oriented x 3. Normal mood.   Labs on Admission: I have personally reviewed following labs and imaging studies  Radiological Exams on Admission: Dg Chest Port 1 View  Result Date: 04/10/2016 CLINICAL DATA:  Bilateral lower extremity swelling for  1 week. Increased heart rate and shortness of breath for 2 days. EXAM: PORTABLE CHEST 1 VIEW COMPARISON:  Single-view of the chest 10/18/2015. FINDINGS: The lungs are clear. Heart size is normal. There is no pneumothorax or pleural effusion. No focal bony abnormality. IMPRESSION: No acute disease. Electronically Signed   By: Inge Rise M.D.   On: 04/10/2016 08:21    EKG: Independently reviewed.   EKG Interpretation  Date/Time:  Monday April 10 2016 07:05:10 EDT Ventricular Rate:  151 PR Interval:    QRS Duration: 91 QT Interval:  282 QTC Calculation: 447 R Axis:   58 Text Interpretation:  Atrial fibrillation Borderline repolarization abnormality Baseline wander in lead(s) V5 Abnormal ekg Confirmed by Carmin Muskrat  MD ([id number]) on 04/10/2016 7:13:42 AM       Assessment/Plan   Active Problems:   Gout   Essential hypertension, benign   Atrial fibrillation with rapid ventricular response (HCC)   Atrial fibrillation with RVR (HCC)   Cellulitis of foot, right   AFib with RVR.  Chadsvasc 2.           -Place in OBS - stepdown -Atrial fibrillation order set  utilized -Management per Cardiology - on Cardizem gtt -will continue home beta blocker, Xarelto  Elevated troponin, 0.15, ? Demand from Afib. Cardiology following. -troponins are being cycle by cards.  -telemetry  Right foot cellulitis. Non-purulent- -Cellulitis order set utilized -ancef 2gms Q8 hours -follow up on blood cultures  Gout.   -continue home gout meds  DVT prophylaxis:     On xarelto Code Status:     Full code  Family Communication:    None Disposition Plan:   Discharge  Home in 24-48 hours              Consults called:  Cardiology called by EDP  Admission status:   Observation -  Telemetry    Tye Savoy NP [facility] Pager [phone]  If 7PM-7AM, please contact night-coverage [email or web address] Password [email or web address]  04/10/2016, 11:47 AM

## 2016-04-10 NOTE — Progress Notes (Signed)
Pt arrived to 2c11 from ED. Pt alert and orientated. Belongings include cell phone, crutches, shoes, clothes, and home prescriptions.

## 2016-04-10 NOTE — Consult Note (Signed)
Cardiology Consult    Patient ID: Samuel Flynn MRN: [id number], DOB/AGE: February 16, 1976   Admit date: 04/10/2016 Date of Consult: 04/10/2016  Primary Physician: Marcial Pacas, DO Reason for Consult: Afib Primary Cardiologist: Dr. Oval Linsey Requesting Provider: Dr. Vanita Panda  Patient Profile  Samuel Flynn is a 40 year old male with a past medical history of paroxysmal atrial fibrillation, obstructive sleep apnea, hypertension, and obesity. He presents to the ED on 04/10/2016 with A. fib RVR with a rate in the 150s.  History of Present Illness  Samuel Flynn was asleep at home today and was awakened with chest pressure and palpitations. He says for about a week he's noticed bilateral lower extremity edema especially in his right foot. He came to the ED, EKG showed A. fib with rapid ventricular response with a rate of 151.  He has a history of paroxysmal atrial fibrillation. He was diagnosed with atrial fibrillation in February of this year. He was started on Xarelto for anticoagulation. He tells me that he has not missed any doses of his Xarelto.   He was last seen in the office in March of this year (In [facility] then) and his metoprolol was switched to diltiazem 120 mg daily. He was experiencing some fatigue and it was felt that taking him off the beta blocker and using diltiazem for rate control might help his fatigue. He was taking the diltiazem until June when he saw his primary care physician. His primary care physician stop the diltiazem and restarted the metoprolol, as his gout medicine had an interaction with diltiazem. He tells me he hasn't missed any doses of metoprolol.  At his last office visit with Dr. Oval Linsey he told her that he has mid to right chest pain that occurs twice daily and lasts for about 5-6 minutes. He was sent for [facility] that resulted low risk with no ischemia or infarction.  Of note his right foot is swollen and erythematous, consistent with a cellulitis  picture. A blood cell count is elevated at 12.9, and he is febrile.  Other notable labs include mildly elevated troponin at 0.15 consistent with demand ischemia from his atrial fibrillation. Also his BNP is elevated at 143.9, and creatinine elevated at 1.5.  Past Medical History   Past Medical History:  Diagnosis Date  . Gastric ulcer   . Gout   . Hypertension    no meds  . Wears glasses     Past Surgical History:  Procedure Laterality Date  . ELBOW LIGAMENT RECONSTRUCTION Right 09/29/2014   Procedure: CORANOID FRACTURE FIXATION, POSSIBLE LIGAMENT REPAIR;  Surgeon: Nita Sells, MD;  Location: [facility];  Service: Orthopedics;  Laterality: Right;  Right elbow coranoid fracture fixation, possible ligament repair  . WISDOM TOOTH EXTRACTION       Allergies  No Known Allergies  Inpatient Medications      Family History    Family History  Problem Relation Age of Onset  . Hypertension Paternal Uncle   . Hypertension Maternal Grandmother   . Hypertension Paternal Uncle   . Hypertension Paternal Uncle   . Hypertension Paternal Uncle   . Hypertension Paternal Uncle     Social History    Social History   Social History  . Marital status: Married    Spouse name: N/A  . Number of children: N/A  . Years of education: N/A   Occupational History  . Not on file.   Social History Main Topics  . Smoking status: Former  Smoker    Types: Cigars  . Smokeless tobacco: Never Used  . Alcohol use 0.0 oz/week     Comment: occ  . Drug use: No  . Sexual activity: Yes     Comment: 5 a day   Other Topics Concern  . Not on file   Social History Narrative  . No narrative on file     Review of Systems    General:  No chills, fever, night sweats or weight changes.  Cardiovascular:  + chest pressure, + dyspnea on exertion, + edema, orthopnea, + palpitations, paroxysmal nocturnal dyspnea. Dermatological: No rash, lesions/masses Respiratory: No cough,  dyspnea Urologic: No hematuria, dysuria Abdominal:   No nausea, vomiting, diarrhea, bright red blood per rectum, melena, or hematemesis Neurologic:  No visual changes, wkns, changes in mental status. All other systems reviewed and are otherwise negative except as noted above.  Physical Exam    Blood pressure 122/86, pulse (!) 52, temperature 100.3 F (37.9 C), temperature source Oral, resp. rate 20, weight 280 lb (127 kg), SpO2 99 %.  General: Pleasant, NAD Psych: Normal affect. Neuro: Alert and oriented X 3. Moves all extremities spontaneously. HEENT: Normal  Neck: Supple without bruits or JVD. Lungs:  Resp regular and unlabored, CTA. Heart: IRRR no s3, s4, or murmurs. Abdomen: Soft, non-tender, non-distended, BS + x 4.  Extremities: No clubbing, cyanosis. 2+ pedal edema. DP/PT/Radials 2+ and equal bilaterally. Right foot is erythematous.  Labs     Recent Labs  04/10/16 0800  TROPONINI 0.15*   Lab Results  Component Value Date   WBC 12.9 (H) 04/10/2016   HGB 12.4 (L) 04/10/2016   HCT 38.0 (L) 04/10/2016   MCV 84.3 04/10/2016   PLT 197 04/10/2016    Recent Labs Lab 04/10/16 0800  NA 138  K 4.2  CL 107  CO2 24  BUN 14  CREATININE 1.50*  CALCIUM 8.6*  PROT 7.1  BILITOT 2.2*  ALKPHOS 74  ALT 31  AST 21  GLUCOSE 107*     Radiology Studies    Dg Chest Port 1 View  Result Date: 04/10/2016 CLINICAL DATA:  Bilateral lower extremity swelling for 1 week. Increased heart rate and shortness of breath for 2 days. EXAM: PORTABLE CHEST 1 VIEW COMPARISON:  Single-view of the chest 10/18/2015. FINDINGS: The lungs are clear. Heart size is normal. There is no pneumothorax or pleural effusion. No focal bony abnormality. IMPRESSION: No acute disease. Electronically Signed   By: Inge Rise M.D.   On: 04/10/2016 08:21    EKG & Cardiac Imaging    EKG: Afib RVR  Echocardiogram: Feb. 2017 Study Conclusions  - Left ventricle: The cavity size was normal. There was mild  focal   basal hypertrophy of the septum. Systolic function was normal.   The estimated ejection fraction was in the range of 60% to 65%.   Wall motion was normal; there were no regional wall motion   abnormalities. - Mitral valve: Calcified annulus.  Assessment & Plan  1. Atrial fibrillation with rapid ventricular response: Started on diltiazem drip, will continue. Suspect that his rapid rate is precipitated by right foot infection. He will most likely need to go back on by mouth diltiazem for rate control outpatient. He has awareness of his atrial fibrillation with palpitations and chest pressure.   BMP is elevated. He does have some lower extremity edema. Would give one dose of 40 mg of IV Lasix.  This patients CHA2DS2-VASc Score and unadjusted Ischemic Stroke Rate (%  per year) is equal to 2.2 % stroke rate/year from a score of 2 Above score calculated as 1 point each if present [CHF, HTN, DM, Vascular=MI/PAD/Aortic Plaque, Age if 65-74, or Male], 2 points each if present [Age > 75, or Stroke/TIA/TE]  He is on Xarelto, continue same. TSH normal at 2.04. Last Echo was in Feb. 2017, LVEF normal, left atrium normal in size.   2. HTN: normotensive now on diltiazem gtt. Will follow.   3. OSA: He has obstructive sleep apnea but CPAP machine requires $800 co-pay. He has not yet obtained the machine. Suspect that his fatigue is related to this.   4. Right foot cellulitis: Management per primary team.  Signed, Arbutus Leas, NP 04/10/2016, 9:27 AM Pager: [phone]  I have personally seen and examined this patient with Jettie Booze, NP. I agree with the assessment and plan as outlined above. He is known to have PAF and is on daily Xarelto. He has been compliant with Xarelto use. He has been compliant with his beta blocker therapy. Woke this am with chest pain and dyspnea. Found to be in atrial fib with RVR. He is also febrile with elevated WBC count and what appears to be cellulitis of his right  leg. His atrial fib is likely exacerbated by his active infectious process.  Exam shows an obese white male with NAD, irreg irreg with no murmurs, lungs clear, mild bilateral LE edema. The right lower ext is warm to touch with erythema.  Labs reviewed.  Atrial fib with RVR: Agree with rate control with IV Cardizem. Hopefully he will convert to sinus with the Cardizem drip. He is on Xarelto. If he does not convert to sinus, plan DCCV tomorrow. Will not need TEE since he has been taking Xarelto every day.  Acute diastolic CHF: he is volume overloaded. One dose IV Lasix today.  Cellulitis: Per primary team.  Chest pain/Elevated troponin: Likely demand ischemia with rapid HR. Normal stress test this spring.   Lauree Chandler 04/10/2016  10:28 AM

## 2016-04-10 NOTE — ED Notes (Signed)
Notified Dr Vanita Panda of elevated troponin.

## 2016-04-10 NOTE — ED Provider Notes (Signed)
[facility] DEPT Provider Note   CSN: [id number] Arrival date & time: 04/10/16  [id number]  First Provider Contact:  First MD Initiated Contact with Patient 04/10/16 0710     History   Chief Complaint Chief Complaint  Patient presents with  . Leg Pain  . Leg Swelling    HPI Samuel Flynn is a 40 y.o. male.  HPI  Patient presents with concern of chest pain, dyspnea, fatigue. Symptoms began seemingly within the past 12 hours, though he has had intermittent chest pain recently. Since onset symptoms of been persistent, with no relief in spite of taking additional dose of home on beta blocker. He states that he has been compliant with all medication in general, including Xarelto. He also notes new area of erythema, pain in the right ankle. Patient attributes this to gout, of which he has a history.   Past Medical History:  Diagnosis Date  . Gastric ulcer   . Gout   . Hypertension    no meds  . Wears glasses     Patient Active Problem List   Diagnosis Date Noted  . Atrial fibrillation with rapid ventricular response ([place]) 10/18/2015  . Fracture of coronoid process of ulna, right, closed 09/21/2014  . Essential hypertension, benign 02/19/2014  . DUODENAL ULCER 09/18/2007  . RENAL CYST, LEFT 09/06/2007  . FLANK PAIN, RIGHT 08/26/2007  . PAIN IN THORACIC SPINE 02/18/2007  . BACK PAIN, LUMBAR 01/09/2007  . SYNCOPE 01/03/2007  . GOUT 12/27/2006  . MORBID OBESITY 12/27/2006  . CARDIAC ARRHYTHMIA 12/27/2006  . SYMPTOM, DISTURBANCE OF SKIN SENSATION 12/27/2006    Past Surgical History:  Procedure Laterality Date  . ELBOW LIGAMENT RECONSTRUCTION Right 09/29/2014   Procedure: CORANOID FRACTURE FIXATION, POSSIBLE LIGAMENT REPAIR;  Surgeon: Nita Sells, MD;  Location: [facility];  Service: Orthopedics;  Laterality: Right;  Right elbow coranoid fracture fixation, possible ligament repair  . WISDOM TOOTH EXTRACTION         Home Medications     Prior to Admission medications   Medication Sig Start Date End Date Taking? Authorizing Provider  colchicine 0.6 MG tablet Take 0.6 mg by mouth daily as needed (gout flare).  08/20/15   Historical Provider, MD  diltiazem (CARDIZEM CD) 120 MG 24 hr capsule Take 1 capsule (120 mg total) by mouth daily. 11/03/15   Skeet Latch, MD  docusate sodium (COLACE) 100 MG capsule Take 1 capsule (100 mg total) by mouth 3 (three) times daily as needed. 09/29/14   Grier Mitts, PA-C  lisinopril (PRINIVIL,ZESTRIL) 20 MG tablet Take 1 tablet by mouth daily. Take 1 tab by mouth daily 10/07/15   Historical Provider, MD  ondansetron (ZOFRAN ODT) 4 MG disintegrating tablet Take 1 tablet (4 mg total) by mouth every 8 (eight) hours as needed for nausea or vomiting. 11/06/14   Kristen N Ward, DO  pantoprazole (PROTONIX) 40 MG tablet Take 1 tablet (40 mg total) by mouth daily. 03/14/16   Skeet Latch, MD  rivaroxaban (XARELTO) 20 MG TABS tablet Take 1 tablet (20 mg total) by mouth daily with supper. 10/19/15   Brittainy Erie Noe, PA-C  sucralfate (CARAFATE) 1 G tablet Take 1 tablet (1 g total) by mouth 4 (four) times daily -  with meals and at bedtime. 11/06/14   Kristen N Ward, DO  XARELTO 20 MG TABS tablet TAKE 1 TABLET (20 MG TOTAL) BY MOUTH DAILY WITH SUPPER. 12/01/15   Skeet Latch, MD    Family History Family History  Problem  Relation Age of Onset  . Hypertension Paternal Uncle   . Hypertension Maternal Grandmother   . Hypertension Paternal Uncle   . Hypertension Paternal Uncle   . Hypertension Paternal Uncle   . Hypertension Paternal Uncle     Social History Social History  Substance Use Topics  . Smoking status: Former Smoker    Types: Cigars  . Smokeless tobacco: Never Used  . Alcohol use 0.0 oz/week     Comment: occ     Allergies   Review of patient's allergies indicates no known allergies.   Review of Systems Review of Systems  Constitutional: Positive for chills and fever.   HENT:       Per HPI, otherwise negative  Respiratory:       Per HPI, otherwise negative  Cardiovascular:       Per HPI, otherwise negative  Gastrointestinal: Negative for vomiting.  Endocrine:       Negative aside from HPI  Genitourinary:       Neg aside from HPI   Musculoskeletal:       Per HPI, otherwise negative  Skin: Positive for color change.  Neurological: Positive for weakness and light-headedness. Negative for syncope.     Physical Exam Updated Vital Signs Temp 100.3 F (37.9 C) (Oral)   Wt 280 lb (127 kg)   BMI 35.95 kg/m   Physical Exam  Constitutional: He is oriented to person, place, and time.  Anxious, obese male sitting upright, speaking clearly, but in obvious discomfort  HENT:  Head: Normocephalic and atraumatic.  Eyes: Conjunctivae and EOM are normal.  Cardiovascular: An irregularly irregular rhythm present. Tachycardia present.   Pulmonary/Chest: Effort normal. No stridor. Tachypnea noted.  Abdominal: He exhibits no distension.  Musculoskeletal: He exhibits no edema.  Patient has elected to move the right ankle, foot secondary to pain, but musculoskeletal exam otherwise unremarkable  Neurological: He is alert and oriented to person, place, and time.  Skin: He is diaphoretic.  Circumferentially about the right ankle, proximal foot there is erythema, induration, tenderness to palpation. No discharge, bleeding, obvious lesion  Psychiatric: His mood appears anxious.  Nursing note and vitals reviewed.    ED Treatments / Results  Labs (all labs ordered are listed, but only abnormal results are displayed) Labs Reviewed  COMPREHENSIVE METABOLIC PANEL - Abnormal; Notable for the following:       Result Value   Glucose, Bld 107 (*)    Creatinine, Ser 1.50 (*)    Calcium 8.6 (*)    Total Bilirubin 2.2 (*)    GFR calc non Af Amer 56 (*)    All other components within normal limits  TROPONIN I - Abnormal; Notable for the following:    Troponin I 0.15  (*)    All other components within normal limits  BRAIN NATRIURETIC PEPTIDE - Abnormal; Notable for the following:    B Natriuretic Peptide 143.9 (*)    All other components within normal limits  CBC WITH DIFFERENTIAL/PLATELET - Abnormal; Notable for the following:    WBC 12.9 (*)    Hemoglobin 12.4 (*)    HCT 38.0 (*)    RDW 18.0 (*)    Neutro Abs 11.6 (*)    All other components within normal limits  CULTURE, BLOOD (ROUTINE X 2)  CULTURE, BLOOD (ROUTINE X 2)  MAGNESIUM  T4, FREE  TSH  TROPONIN I  TROPONIN I  I-STAT CG4 LACTIC ACID, ED    EKG  EKG Interpretation  Date/Time:  Monday April 10 2016 07:05:10 EDT Ventricular Rate:  151 PR Interval:    QRS Duration: 91 QT Interval:  282 QTC Calculation: 447 R Axis:   58 Text Interpretation:  Atrial fibrillation Borderline repolarization abnormality Baseline wander in lead(s) V5 Abnormal ekg Confirmed by Carmin Muskrat  MD [phone]) on 04/10/2016 7:13:42 AM     Chart review notable for history of atrial fibrillation, patient currently taking Xarelto, Cardizem, labetalol. Radiology Dg Chest Port 1 View  Result Date: 04/10/2016 CLINICAL DATA:  Bilateral lower extremity swelling for 1 week. Increased heart rate and shortness of breath for 2 days. EXAM: PORTABLE CHEST 1 VIEW COMPARISON:  Single-view of the chest 10/18/2015. FINDINGS: The lungs are clear. Heart size is normal. There is no pneumothorax or pleural effusion. No focal bony abnormality. IMPRESSION: No acute disease. Electronically Signed   By: Inge Rise M.D.   On: 04/10/2016 08:21    Procedures Procedures (including critical care time) After the initial evaluation with concern for cellulitis, persistent atrial fibrillation, rapid ventricular response the patient received IV fluids, appear antibiotics.    Medications Ordered in ED Medications  sodium chloride 0.9 % bolus 1,000 mL (0 mLs Intravenous Stopped 04/10/16 0907)  vancomycin (VANCOCIN) IVPB 1000 mg/200 mL  premix (0 mg Intravenous Stopped 04/10/16 0847)  acetaminophen (TYLENOL) tablet 1,000 mg (1,000 mg Oral Given 04/10/16 0747)  diltiazem (CARDIZEM) 100 mg in dextrose 5 % 100 mL (1 mg/mL) infusion (15 mg/hr Intravenous Rate/Dose Change 04/10/16 1035)  HYDROmorphone (DILAUDID) injection 1 mg (1 mg Intravenous Given 04/10/16 1033)     Initial Impression / Assessment and Plan / ED Course  I have reviewed the triage vital signs and the nursing notes.  Pertinent labs & imaging results that were available during my care of the patient were reviewed by me and considered in my medical decision making (see chart for details).  Clinical Course  Comment By Time  Patient more comfortable, Vanc running, IVF running, HR ~120, afib Carmin Muskrat, MD 08/07 [phone]    Following completion of vancomycin patient has persistent tachycardia, and patient started on diltiazem drip. With return of positive troponin, persistent atrial fibrillation I discussed this case with cardiology for consultation.   On repeat exam patient has ongoing pain, erythema continues to decrease. Remaining labs notable for elevated troponin, possibly demand ischemia. Patient confirms medication compliance recently, including Xarelto.  Update: Cardiology evaluation, consultation completed, consistent with continued IV diltiazem attempted for rate control  On repeat exam he remains tachycardic  Final Clinical Impressions(s) / ED Diagnoses  Patient with multiple medical issues including atrial fibrillation, gout presents with new dyspnea, chest pain, and right ankle redness, pain. Here, there is immediate consideration for cellulitis, and consideration of this as a precipitant for his atrial fibrillation, rapid ventricular response. Patient received empiric antibiotics for his cellulitis, fluids. Given the persistent nature fibrillation, after patient initially took his own beta blocker dose, patient was started on a Cardizem drip. Patient  had some decline in heart rate, though remained in atrial fibrillation. After discussion with hospitalist, cardiology, he required admission for further evaluation, management.  CRITICAL CARE Performed by: Carmin Muskrat Total critical care time: 40 minutes Critical care time was exclusive of separately billable procedures and treating other patients. Critical care was necessary to treat or prevent imminent or life-threatening deterioration. Critical care was time spent personally by me on the following activities: development of treatment plan with patient and/or surrogate as well as nursing, discussions with consultants, evaluation of patient's response to treatment,  examination of patient, obtaining history from patient or surrogate, ordering and performing treatments and interventions, ordering and review of laboratory studies, ordering and review of radiographic studies, pulse oximetry and re-evaluation of patient's condition.    Carmin Muskrat, MD 04/10/16 [id number]

## 2016-04-10 NOTE — ED Triage Notes (Addendum)
Pt in from home with c/o Bilateral leg swelling/pain x1 week, and rapid heart rate, sob x 2 days. Pt has hx of Afib, takes metoprolol. Took dose of Lisinopril this morning. HR in 120's-170's. Current temp of 100.3 oral

## 2016-04-10 NOTE — ED Notes (Signed)
Attempted report 

## 2016-04-11 DIAGNOSIS — I10 Essential (primary) hypertension: Secondary | ICD-10-CM | POA: Diagnosis not present

## 2016-04-11 DIAGNOSIS — R7989 Other specified abnormal findings of blood chemistry: Secondary | ICD-10-CM | POA: Diagnosis not present

## 2016-04-11 DIAGNOSIS — L03115 Cellulitis of right lower limb: Secondary | ICD-10-CM | POA: Diagnosis not present

## 2016-04-11 DIAGNOSIS — M1000 Idiopathic gout, unspecified site: Secondary | ICD-10-CM

## 2016-04-11 DIAGNOSIS — I4891 Unspecified atrial fibrillation: Secondary | ICD-10-CM | POA: Diagnosis not present

## 2016-04-11 LAB — BASIC METABOLIC PANEL
Anion gap: 11 (ref 5–15)
BUN: 15 mg/dL (ref 6–20)
CO2: 24 mmol/L (ref 22–32)
Calcium: 8.4 mg/dL — ABNORMAL LOW (ref 8.9–10.3)
Chloride: 101 mmol/L (ref 101–111)
Creatinine, Ser: 1.7 mg/dL — ABNORMAL HIGH (ref 0.61–1.24)
GFR calc Af Amer: 56 mL/min — ABNORMAL LOW (ref >60)
GFR calc non Af Amer: 48 mL/min — ABNORMAL LOW (ref >60)
Glucose, Bld: 99 mg/dL (ref 65–99)
Potassium: 4 mmol/L (ref 3.5–5.1)
Sodium: 136 mmol/L (ref 135–145)

## 2016-04-11 LAB — CBC
HCT: 33.4 % — ABNORMAL LOW (ref 39.0–52.0)
Hemoglobin: 10.7 g/dL — ABNORMAL LOW (ref 13.0–17.0)
MCH: 27.3 pg (ref 26.0–34.0)
MCHC: 32 g/dL (ref 30.0–36.0)
MCV: 85.2 fL (ref 78.0–100.0)
Platelets: 192 10*3/uL (ref 150–400)
RBC: 3.92 MIL/uL — ABNORMAL LOW (ref 4.22–5.81)
RDW: 18.3 % — ABNORMAL HIGH (ref 11.5–15.5)
WBC: 10.4 10*3/uL (ref 4.0–10.5)

## 2016-04-11 LAB — URIC ACID: Uric Acid, Serum: 6.9 mg/dL (ref 4.4–7.6)

## 2016-04-11 MED ORDER — KETOROLAC TROMETHAMINE 30 MG/ML IJ SOLN
30.0000 mg | Freq: Once | INTRAMUSCULAR | Status: AC
Start: 2016-04-11 — End: 2016-04-11
  Administered 2016-04-11: 30 mg via INTRAVENOUS
  Filled 2016-04-11: qty 1

## 2016-04-11 MED ORDER — HYDROCODONE-ACETAMINOPHEN 5-325 MG PO TABS
1.0000 | ORAL_TABLET | ORAL | Status: DC | PRN
  Administered 2016-04-11 – 2016-04-12 (×2): 2 via ORAL
  Filled 2016-04-11 (×2): qty 2

## 2016-04-11 NOTE — Progress Notes (Signed)
PROGRESS NOTE    Samuel Flynn  [id number] DOB: 1976/02/24 DOA: 04/10/2016 PCP: Marcial Pacas, DO   Brief Narrative:  40 y.o. WM PMHx Atrial Fibrillation with RVR Dx 10/2015, HTN, OSA, Obesity, Gastric ulcer,.   Presents to the emergency department early this morning with bilateral lower extremity swelling and pain, shortness of breath and rapid heart rate. Patient has history of gout, takes daily Zyloprim. Approximate 2 weeks ago he began having pain in the top of his right foot. He's had intermittent swelling of his right lower extremity over the last couple of weeks. 2 days ago the right foot became markedly swollen and erythematous. He was unable to bear weight on the foot because of pain. Fevers and chills at home over the last couple of days. Similar symptoms in July but symptoms improved with gout medication and ibuprofen. No trauma to the right lower extremity. Today patient tells me that his heart started"acting out". Girlfriend noticed that patient was coughing which he often does when in atrial fibrillation. Patient also noticed that his heart rate was fast and irregular. Two weeks ago patient described swollen uvula which resolved with ibuprofen   Subjective: 8/8  A/O 4, states initially pain/swelling started in bilateral calves and then began to this into bilateral feet. Negative injury to lower extremity per patient.    Assessment & Plan:   Active Problems:   Gout   Essential hypertension, benign   Atrial fibrillation with rapid ventricular response (HCC)   Atrial fibrillation with RVR (H[facility])   Cellulitis of foot, right   Elevated troponin I level   AFib with RVR.( Chadsvasc 2).           -Atrial fibrillation order set utilized -Resolved  -Metoprolol 50 mg daily  -will continue  Xarelto 20 mg daily  Elevated troponin, - 0.15, ? Demand from Afib. Cardiology has signed off no further workup required.  Right foot cellulitis. Non-purulent- -Cellulitis order  set utilized -Ancef 2gms Q8 hours -follow up on blood cultures  Gout.   -Colchicine 0.6 mg daily  -Uric acid pending   DVT prophylaxis: Xarelto Code Status: Full Family Communication: None Disposition Plan: Resolution of cellulitis   Consultants:  Dr.Christopher D McAlhany cardiology    Procedures/Significant Events:  NA  Cultures 8/7 blood right/left AC NGTD 8/7 MRSA by PCR negative   Antimicrobials: Ancef 8/7>>   Devices    LINES / TUBES:      Continuous Infusions:    Objective: Vitals:   04/11/16 0357 04/11/16 0400 04/11/16 0800 04/11/16 0929  BP: (!) 122/56 (!) 106/58 115/64 108/68  Pulse: 60 (!) 58 (!) 51 (!) 55  Resp: 15 18 17    Temp: 98.3 F (36.8 C)  98.1 F (36.7 C)   TempSrc: Oral  Oral   SpO2: 96% 100% 98%   Weight:      Height:        Intake/Output Summary (Last 24 hours) at 04/11/16 1240 Last data filed at 04/11/16 0931  Gross per 24 hour  Intake              151 ml  Output             2400 ml  Net            -2249 ml   Filed Weights   04/10/16 0714 04/10/16 1404  Weight: 127 kg (280 lb) 124.6 kg (274 lb 11.1 oz)    Examination:  General: A/O 4, left lower extremity pain, No  acute respiratory distress Eyes: negative scleral hemorrhage, negative anisocoria, negative icterus ENT: Negative Runny nose, negative gingival bleeding, Neck:  Negative scars, masses, torticollis, lymphadenopathy, JVD Lungs: Clear to auscultation bilaterally without wheezes or crackles Cardiovascular: Regular rate and rhythm without murmur gallop or rub normal S1 and S2 Abdomen: negative abdominal pain, nondistended, positive soft, bowel sounds, no rebound, no ascites, no appreciable mass Extremities: No significant cyanosis, clubbing, RLL, swelling, erythema, painful to touch, negative rubor, positive DP/PT pulse. Skin: Negative rashes, lesions, ulcers Psychiatric:  Negative depression, negative anxiety, negative fatigue, negative mania  Central  nervous system:  Cranial nerves II through XII intact, tongue/uvula midline, all extremities muscle strength 5/5, sensation intact throughout,negative dysarthria, negative expressive aphasia, negative receptive aphasia.  .     Data Reviewed: Care during the described time interval was provided by me .  I have reviewed this patient's available data, including medical history, events of note, physical examination, and all test results as part of my evaluation. I have personally reviewed and interpreted all radiology studies.  CBC:  Recent Labs Lab 04/10/16 0800 04/11/16 0443  WBC 12.9* 10.4  NEUTROABS 11.6*  --   HGB 12.4* 10.7*  HCT 38.0* 33.4*  MCV 84.3 85.2  PLT 197 [id number]   Basic Metabolic Panel:  Recent Labs Lab 04/10/16 0800 04/11/16 0443  NA 138 136  K 4.2 4.0  CL 107 101  CO2 24 24  GLUCOSE 107* 99  BUN 14 15  CREATININE 1.50* 1.70*  CALCIUM 8.6* 8.4*  MG 2.2  --    GFR: Estimated Creatinine Clearance: 80.2 mL/min (by C-G formula based on SCr of 1.7 mg/dL). Liver Function Tests:  Recent Labs Lab 04/10/16 0800  AST 21  ALT 31  ALKPHOS 74  BILITOT 2.2*  PROT 7.1  ALBUMIN 3.6   No results for input(s): LIPASE, AMYLASE in the last 168 hours. No results for input(s): AMMONIA in the last 168 hours. Coagulation Profile: No results for input(s): INR, PROTIME in the last 168 hours. Cardiac Enzymes:  Recent Labs Lab 04/10/16 0800 04/10/16 1601 04/10/16 2204  TROPONINI 0.15* <0.03 <0.03   BNP (last 3 results) No results for input(s): PROBNP in the last 8760 hours. HbA1C: No results for input(s): HGBA1C in the last 72 hours. CBG: No results for input(s): GLUCAP in the last 168 hours. Lipid Profile: No results for input(s): CHOL, HDL, LDLCALC, TRIG, CHOLHDL, LDLDIRECT in the last 72 hours. Thyroid Function Tests:  Recent Labs  04/10/16 0800  TSH 2.040  FREET4 0.90   Anemia Panel: No results for input(s): VITAMINB12, FOLATE, FERRITIN, TIBC, IRON,  RETICCTPCT in the last 72 hours. Urine analysis:    Component Value Date/Time   COLORURINE YELLOW 11/06/2014 1352   APPEARANCEUR HAZY (A) 11/06/2014 1352   LABSPEC 1.015 11/06/2014 1352   PHURINE 5.5 11/06/2014 1352   GLUCOSEU NEGATIVE 11/06/2014 1352   HGBUR LARGE (A) 11/06/2014 1352   HGBUR negative 08/26/2007 1203   BILIRUBINUR NEGATIVE 11/06/2014 1352   KETONESUR NEGATIVE 11/06/2014 1352   PROTEINUR 30 (A) 11/06/2014 1352   UROBILINOGEN 1.0 11/06/2014 1352   NITRITE NEGATIVE 11/06/2014 1352   LEUKOCYTESUR NEGATIVE 11/06/2014 1352   Sepsis Labs: @LABRCNTIP (procalcitonin:4,lacticidven:4)  ) Recent Results (from the past 240 hour(s))  Culture, blood (routine x 2)     Status: None (Preliminary result)   Collection Time: 04/10/16  7:21 AM  Result Value Ref Range Status   Specimen Description BLOOD RIGHT ANTECUBITAL  Final   Special Requests BOTTLES DRAWN AEROBIC AND  ANAEROBIC 5CC  Final   Culture NO GROWTH 1 DAY  Final   Report Status PENDING  Incomplete  Culture, blood (routine x 2)     Status: None (Preliminary result)   Collection Time: 04/10/16  7:45 AM  Result Value Ref Range Status   Specimen Description BLOOD LEFT ANTECUBITAL  Final   Special Requests   Final    BOTTLES DRAWN AEROBIC AND ANAEROBIC 10CC AER 5CC ANA   Culture NO GROWTH 1 DAY  Final   Report Status PENDING  Incomplete  MRSA PCR Screening     Status: None   Collection Time: 04/10/16  2:08 PM  Result Value Ref Range Status   MRSA by PCR NEGATIVE NEGATIVE Final    Comment:        The GeneXpert MRSA Assay (FDA approved for NASAL specimens only), is one component of a comprehensive MRSA colonization surveillance program. It is not intended to diagnose MRSA infection nor to guide or monitor treatment for MRSA infections.          Radiology Studies: Dg Chest Port 1 View  Result Date: 04/10/2016 CLINICAL DATA:  Bilateral lower extremity swelling for 1 week. Increased heart rate and shortness of  breath for 2 days. EXAM: PORTABLE CHEST 1 VIEW COMPARISON:  Single-view of the chest 10/18/2015. FINDINGS: The lungs are clear. Heart size is normal. There is no pneumothorax or pleural effusion. No focal bony abnormality. IMPRESSION: No acute disease. Electronically Signed   By: Inge Rise M.D.   On: 04/10/2016 08:21        Scheduled Meds: . allopurinol  100 mg Oral Daily  .  ceFAZolin (ANCEF) IV  2 g Intravenous Q8H  . lisinopril  20 mg Oral Daily  . metoprolol succinate  50 mg Oral Daily  . pantoprazole  40 mg Oral Daily  . rivaroxaban  20 mg Oral Q supper  . sodium chloride flush  3 mL Intravenous Q12H   Continuous Infusions:    LOS: 0 days    Time spent: 40 minutes    Samuel Flynn, Samuel Docker, MD [facility] Pager [phone]   If 7PM-7AM, please contact night-coverage [email or web address] Password [email or web address] 04/11/2016, 12:40 PM

## 2016-04-11 NOTE — Progress Notes (Signed)
Patient transferring to 5W. Report called to receiving nurse Sarah. All questions answered. Family notified of transfer.

## 2016-04-11 NOTE — Progress Notes (Signed)
NURSING PROGRESS NOTE  [name] AT:[id number] Transfer Data: 04/11/2016 6:27 PM Attending Provider: Allie Bossier, MD [id number], [facility], DO Code Status: Full   Samuel Flynn is a 40 y.o. male patient transferred from [facility]  -No acute distress noted.  -No complaints of shortness of breath.  -No complaints of chest pain.   Cardiac Monitoring: Box # 06 in place. Cardiac monitor yields:sinus bradycardia.  Last Documented Vital Signs: Blood pressure (!) 121/59, pulse (!) 58, temperature 98 F (36.7 C), temperature source Oral, resp. rate 20, height 6\' 2"  (1.88 m), weight 122.2 kg (269 lb 4.8 oz), SpO2 93 %.  IV Fluids:  IV in place, occlusive dsg intact without redness, IV cath antecubital right, condition patent and no redness IV push only, no IV fluids.   Allergies:  Review of patient's allergies indicates no known allergies.  Past Medical History:   has a past medical history of Atrial fibrillation with RVR ([place]); Cellulitis (04/2016); Gastric ulcer; Gout; Hypertension; Paroxysmal a-fib ([place]); Sleep apnea; and Wears glasses.  Past Surgical History:   has a past surgical history that includes Wisdom tooth extraction and Elbow ligament reconstruction (Right, 09/29/2014).  Social History:   reports that he quit smoking about 2 months ago. His smoking use included Cigars. He has never used smokeless tobacco. He reports that he drinks alcohol. He reports that he does not use drugs.  Skin: intact except where otherwise charted  Patient/Family orientated to room. Information packet given to patient/family. Admission inpatient armband information verified with patient/family to include name and date of birth and placed on patient arm. Side rails up x 2, fall assessment and education completed with patient/family. Patient/family able to verbalize understanding of risk associated with falls and verbalized understanding to call for assistance before getting out of bed. Call light within  reach. Patient/family able to voice and demonstrate understanding of unit orientation instructions.

## 2016-04-11 NOTE — Progress Notes (Signed)
     SUBJECTIVE: Pt has no chest pain or dyspnea. Only c/o right foot pain.   Tele: sinus brady  BP 115/64 (BP Location: Left Arm)   Pulse (!) 51   Temp 98.1 F (36.7 C) (Oral)   Resp 17   Ht 6\' 2"  (1.88 m)   Wt 274 lb 11.1 oz (124.6 kg)   SpO2 98%   BMI 35.27 kg/m   Intake/Output Summary (Last 24 hours) at 04/11/16 0912 Last data filed at 04/11/16 0437  Gross per 24 hour  Intake             1448 ml  Output             3100 ml  Net            -1652 ml    PHYSICAL EXAM General: Well developed, well nourished, in no acute distress. Alert and oriented x 3.  Psych:  Good affect, responds appropriately Neck: No JVD. No masses noted.  Lungs: Clear bilaterally with no wheezes or rhonci noted.  Heart: Regular brady with no murmurs noted. Abdomen: Bowel sounds are present. Soft, non-tender.  Extremities: 1+ right LE edema with erythema over right foot, increased warmth. No left lower extremity edema.   LABS: Basic Metabolic Panel:  Recent Labs  04/10/16 0800 04/11/16 0443  NA 138 136  K 4.2 4.0  CL 107 101  CO2 24 24  GLUCOSE 107* 99  BUN 14 15  CREATININE 1.50* 1.70*  CALCIUM 8.6* 8.4*  MG 2.2  --    CBC:  Recent Labs  04/10/16 0800 04/11/16 0443  WBC 12.9* 10.4  NEUTROABS 11.6*  --   HGB 12.4* 10.7*  HCT 38.0* 33.4*  MCV 84.3 85.2  PLT 197 192   Cardiac Enzymes:  Recent Labs  04/10/16 0800 04/10/16 1601 04/10/16 2204  TROPONINI 0.15* <0.03 <0.03    Current Meds: . allopurinol  100 mg Oral Daily  .  ceFAZolin (ANCEF) IV  2 g Intravenous Q8H  . diltiazem  60 mg Oral Q8H  . lisinopril  20 mg Oral Daily  . metoprolol succinate  50 mg Oral Daily  . pantoprazole  40 mg Oral Daily  . rivaroxaban  20 mg Oral Q supper  . sodium chloride flush  3 mL Intravenous Q12H     ASSESSMENT AND PLAN:  1. Atrial fib with RVR: Known to have PAF. Likely exacerbated by acute infection. Sinus this am. He is now on oral Cardizem and oral beta blocker. I would  stop the Cardizem and would continue metoprolol only. His Cardizem had been stopped in primary care due to interactions with his gout medications. He is on Xarelto.    2. Acute diastolic CHF: Volume status is ok today. He received one dose of IV Lasix yesterday.    3. Cellulitis: Per primary team.   4. Chest pain/Elevated troponin: First troponin was 0.15 but next two were negative. Normal stress test this spring.  No ischemic workup is necessary.   Will sign off. Please call with questions.   Lauree Chandler  8/8/20179:12 AM

## 2016-04-12 ENCOUNTER — Observation Stay (HOSPITAL_COMMUNITY): Payer: PRIVATE HEALTH INSURANCE

## 2016-04-12 ENCOUNTER — Other Ambulatory Visit: Payer: Self-pay | Admitting: Cardiovascular Disease

## 2016-04-12 DIAGNOSIS — L03115 Cellulitis of right lower limb: Secondary | ICD-10-CM

## 2016-04-12 DIAGNOSIS — I10 Essential (primary) hypertension: Secondary | ICD-10-CM

## 2016-04-12 DIAGNOSIS — I4891 Unspecified atrial fibrillation: Secondary | ICD-10-CM | POA: Diagnosis not present

## 2016-04-12 MED ORDER — OXYCODONE HCL 5 MG PO TABS
10.0000 mg | ORAL_TABLET | Freq: Once | ORAL | Status: AC
  Administered 2016-04-12: 10 mg via ORAL
  Filled 2016-04-12: qty 2

## 2016-04-12 MED ORDER — SODIUM CHLORIDE 0.9 % IV SOLN
INTRAVENOUS | Status: AC
  Administered 2016-04-12 – 2016-04-13 (×2): via INTRAVENOUS

## 2016-04-12 MED ORDER — DIPHENHYDRAMINE HCL 25 MG PO CAPS: 25.0000 mg | ORAL_CAPSULE | Freq: Every evening | ORAL | Status: DC | PRN

## 2016-04-12 MED ORDER — KETOROLAC TROMETHAMINE 30 MG/ML IJ SOLN
30.0000 mg | Freq: Once | INTRAMUSCULAR | Status: AC
  Administered 2016-04-12: 30 mg via INTRAVENOUS
  Filled 2016-04-12: qty 1

## 2016-04-12 MED ORDER — OXYCODONE-ACETAMINOPHEN 5-325 MG PO TABS
1.0000 | ORAL_TABLET | ORAL | Status: DC | PRN
  Administered 2016-04-12 – 2016-04-13 (×8): 2 via ORAL
  Filled 2016-04-12 (×11): qty 2

## 2016-04-12 NOTE — Progress Notes (Signed)
PROGRESS NOTE    Samuel Flynn  [id number]:[id number] DOB: [birth year] DOA: 04/10/2016 PCP: Marcial Pacas, DO   Brief Narrative:  40 y.o. WM PMHx Atrial Fibrillation with RVR Dx 10/2015, HTN, OSA, Obesity, Gastric ulcer,.   Presents to the emergency department early this morning with bilateral lower extremity swelling and pain, shortness of breath and rapid heart rate. Patient has history of gout, takes daily Zyloprim. Approximate 2 weeks ago he began having pain in the top of his right foot. He's had intermittent swelling of his right lower extremity over the last couple of weeks. 2 days ago the right foot became markedly swollen and erythematous. He was unable to bear weight on the foot because of pain. Fevers and chills at home over the last couple of days. Similar symptoms in July but symptoms improved with gout medication and ibuprofen. No trauma to the right lower extremity. Today patient tells me that his heart started"acting out". Girlfriend noticed that patient was coughing which he often does when in atrial fibrillation. Patient also noticed that his heart rate was fast and irregular. Two weeks ago patient described swollen uvula which resolved with ibuprofen   Subjective: Report pain and swelling in right leg has much improved, but no improvement in right foot edema/erythema/pain with limited range of motion right toes due to pain. He asked for " the medicine started with a D " for pain and sleep.    Assessment & Plan:   Active Problems:   Gout   Essential hypertension, benign   Atrial fibrillation with rapid ventricular response (HCC)   Atrial fibrillation with RVR (HCC)   Cellulitis of foot, right   Elevated troponin I level   Paroxysmal AFib with RVR.( Chadsvasc 2).           -Atrial fibrillation order set utilized -Resolved  -Metoprolol 50 mg daily  -continue  Xarelto 20 mg daily -cardiology input appreciated  Elevated troponin, - 0.15, ? Demand from Afib. Cardiology  has signed off no further workup required.  Right foot cellulitis. Non-purulent- -Cellulitis order set utilized -Ancef 2gms Q8 hours -blood cultures no growth, MRSA screening pending -persistent right foot edema, more on the dorsal aspect, will get [place] to r/o deep infection/abscess , get venous doppler to r/o DVt, get foot x ray to r/o osteo, will check esr/crp Elevate foot  Gout.   -Colchicine 0.6 mg daily  -Uric acid 6.9  Cr elevation/AKI: will get ua, renal dosing meds, start gentle hydration, repeat bmp in am   DVT prophylaxis: Xarelto Code Status: Full Family Communication: None Disposition Plan: Resolution of cellulitis   Consultants:  Dr.Christopher D McAlhany cardiology    Procedures/Significant Events:  NA  Cultures 8/7 blood right/left AC NGTD 8/7 MRSA by PCR negative   Antimicrobials: Ancef 8/7>>    Continuous Infusions:    Objective: Vitals:   04/11/16 1546 04/11/16 2123 04/12/16 0633 04/12/16 0928  BP: (!) 121/59 132/64 (!) 107/54 120/61  Pulse: (!) 58 (!) 58 (!) 48 (!) 52  Resp: '20 18 18   '$ Temp: 98 F (36.7 C) 98.7 F (37.1 C) 98.1 F (36.7 C)   TempSrc: Oral Oral Oral   SpO2: 93% 97% 96%   Weight:      Height:        Intake/Output Summary (Last 24 hours) at 04/12/16 1408 Last data filed at 04/11/16 1919  Gross per 24 hour  Intake                0  ml  Output              350 ml  Net             -350 ml   Filed Weights   04/10/16 0714 04/10/16 1404 04/11/16 1543  Weight: 127 kg (280 lb) 124.6 kg (274 lb 11.1 oz) 122.2 kg (269 lb 4.8 oz)    Examination:  General: A/O 4, left lower extremity pain, No acute respiratory distress Eyes: negative scleral hemorrhage, negative anisocoria, negative icterus ENT: Negative Runny nose, negative gingival bleeding, Neck:  Negative scars, masses, torticollis, lymphadenopathy, JVD Lungs: Clear to auscultation bilaterally without wheezes or crackles Cardiovascular: Regular rate and rhythm  without murmur gallop or rub normal S1 and S2 Abdomen: negative abdominal pain, nondistended, positive soft, bowel sounds, no rebound, no ascites, no appreciable mass Extremities: No significant cyanosis, clubbing, RLL, swelling, erythema, painful to touch, negative rubor, positive DP/PT pulse. Skin: Negative rashes, lesions, ulcers Psychiatric:  Negative depression, negative anxiety, negative fatigue, negative mania  Central nervous system:  Cranial nerves II through XII intact, tongue/uvula midline, all extremities muscle strength 5/5, sensation intact throughout,negative dysarthria, negative expressive aphasia, negative receptive aphasia.  .     Data Reviewed: Care during the described time interval was provided by me .  I have reviewed this patient's available data, including medical history, events of note, physical examination, and all test results as part of my evaluation. I have personally reviewed and interpreted all radiology studies.  CBC:  Recent Labs Lab 04/10/16 0800 04/11/16 0443  WBC 12.9* 10.4  NEUTROABS 11.6*  --   HGB 12.4* 10.7*  HCT 38.0* 33.4*  MCV 84.3 85.2  PLT 197 431   Basic Metabolic Panel:  Recent Labs Lab 04/10/16 0800 04/11/16 0443  NA 138 136  K 4.2 4.0  CL 107 101  CO2 24 24  GLUCOSE 107* 99  BUN 14 15  CREATININE 1.50* 1.70*  CALCIUM 8.6* 8.4*  MG 2.2  --    GFR: Estimated Creatinine Clearance: 79.4 mL/min (by C-G formula based on SCr of 1.7 mg/dL). Liver Function Tests:  Recent Labs Lab 04/10/16 0800  AST 21  ALT 31  ALKPHOS 74  BILITOT 2.2*  PROT 7.1  ALBUMIN 3.6   No results for input(s): LIPASE, AMYLASE in the last 168 hours. No results for input(s): AMMONIA in the last 168 hours. Coagulation Profile: No results for input(s): INR, PROTIME in the last 168 hours. Cardiac Enzymes:  Recent Labs Lab 04/10/16 0800 04/10/16 1601 04/10/16 2204  TROPONINI 0.15* <0.03 <0.03   BNP (last 3 results) No results for input(s):  PROBNP in the last 8760 hours. HbA1C: No results for input(s): HGBA1C in the last 72 hours. CBG: No results for input(s): GLUCAP in the last 168 hours. Lipid Profile: No results for input(s): CHOL, HDL, LDLCALC, TRIG, CHOLHDL, LDLDIRECT in the last 72 hours. Thyroid Function Tests:  Recent Labs  04/10/16 0800  TSH 2.040  FREET4 0.90   Anemia Panel: No results for input(s): VITAMINB12, FOLATE, FERRITIN, TIBC, IRON, RETICCTPCT in the last 72 hours. Urine analysis:    Component Value Date/Time   COLORURINE YELLOW 11/06/2014 1352   APPEARANCEUR HAZY (A) 11/06/2014 1352   LABSPEC 1.015 11/06/2014 1352   PHURINE 5.5 11/06/2014 1352   GLUCOSEU NEGATIVE 11/06/2014 1352   HGBUR LARGE (A) 11/06/2014 1352   HGBUR negative 08/26/2007 1203   BILIRUBINUR NEGATIVE 11/06/2014 1352   KETONESUR NEGATIVE 11/06/2014 1352   PROTEINUR 30 (A) 11/06/2014 1352  UROBILINOGEN 1.0 11/06/2014 1352   NITRITE NEGATIVE 11/06/2014 1352   LEUKOCYTESUR NEGATIVE 11/06/2014 1352   Sepsis Labs: '@LABRCNTIP'$ (procalcitonin:4,lacticidven:4)  ) Recent Results (from the past 240 hour(s))  Culture, blood (routine x 2)     Status: None (Preliminary result)   Collection Time: 04/10/16  7:21 AM  Result Value Ref Range Status   Specimen Description BLOOD RIGHT ANTECUBITAL  Final   Special Requests BOTTLES DRAWN AEROBIC AND ANAEROBIC 5CC  Final   Culture NO GROWTH 1 DAY  Final   Report Status PENDING  Incomplete  Culture, blood (routine x 2)     Status: None (Preliminary result)   Collection Time: 04/10/16  7:45 AM  Result Value Ref Range Status   Specimen Description BLOOD LEFT ANTECUBITAL  Final   Special Requests   Final    BOTTLES DRAWN AEROBIC AND ANAEROBIC 10CC AER 5CC ANA   Culture NO GROWTH 1 DAY  Final   Report Status PENDING  Incomplete  MRSA PCR Screening     Status: None   Collection Time: 04/10/16  2:08 PM  Result Value Ref Range Status   MRSA by PCR NEGATIVE NEGATIVE Final    Comment:          The GeneXpert MRSA Assay (FDA approved for NASAL specimens only), is one component of a comprehensive MRSA colonization surveillance program. It is not intended to diagnose MRSA infection nor to guide or monitor treatment for MRSA infections.          Radiology Studies: No results found.      Scheduled Meds: . allopurinol  100 mg Oral Daily  .  ceFAZolin (ANCEF) IV  2 g Intravenous Q8H  . lisinopril  20 mg Oral Daily  . metoprolol succinate  50 mg Oral Daily  . pantoprazole  40 mg Oral Daily  . rivaroxaban  20 mg Oral Q supper  . sodium chloride flush  3 mL Intravenous Q12H   Continuous Infusions:    LOS: 0 days    Time spent: 40 minutes    Tiaja Hagan, MD PhD [facility] Pager [phone]  If 7PM-7AM, please contact night-coverage [email or web address] Password [email or web address] 04/12/2016, 2:08 PM

## 2016-04-12 NOTE — Telephone Encounter (Signed)
Review for refill, Thank you. 

## 2016-04-13 ENCOUNTER — Observation Stay (HOSPITAL_COMMUNITY): Payer: PRIVATE HEALTH INSURANCE

## 2016-04-13 DIAGNOSIS — E669 Obesity, unspecified: Secondary | ICD-10-CM | POA: Diagnosis present

## 2016-04-13 DIAGNOSIS — Z6835 Body mass index (BMI) 35.0-35.9, adult: Secondary | ICD-10-CM | POA: Diagnosis not present

## 2016-04-13 DIAGNOSIS — Z79899 Other long term (current) drug therapy: Secondary | ICD-10-CM | POA: Diagnosis not present

## 2016-04-13 DIAGNOSIS — Z8249 Family history of ischemic heart disease and other diseases of the circulatory system: Secondary | ICD-10-CM | POA: Diagnosis not present

## 2016-04-13 DIAGNOSIS — M109 Gout, unspecified: Secondary | ICD-10-CM | POA: Diagnosis present

## 2016-04-13 DIAGNOSIS — Z87891 Personal history of nicotine dependence: Secondary | ICD-10-CM | POA: Diagnosis not present

## 2016-04-13 DIAGNOSIS — M7989 Other specified soft tissue disorders: Secondary | ICD-10-CM

## 2016-04-13 DIAGNOSIS — I10 Essential (primary) hypertension: Secondary | ICD-10-CM | POA: Diagnosis not present

## 2016-04-13 DIAGNOSIS — I248 Other forms of acute ischemic heart disease: Secondary | ICD-10-CM | POA: Diagnosis present

## 2016-04-13 DIAGNOSIS — I5031 Acute diastolic (congestive) heart failure: Secondary | ICD-10-CM | POA: Diagnosis present

## 2016-04-13 DIAGNOSIS — I480 Paroxysmal atrial fibrillation: Secondary | ICD-10-CM | POA: Diagnosis present

## 2016-04-13 DIAGNOSIS — L03115 Cellulitis of right lower limb: Secondary | ICD-10-CM | POA: Diagnosis present

## 2016-04-13 DIAGNOSIS — R079 Chest pain, unspecified: Secondary | ICD-10-CM | POA: Diagnosis present

## 2016-04-13 DIAGNOSIS — I110 Hypertensive heart disease with heart failure: Secondary | ICD-10-CM | POA: Diagnosis present

## 2016-04-13 DIAGNOSIS — G4733 Obstructive sleep apnea (adult) (pediatric): Secondary | ICD-10-CM | POA: Diagnosis present

## 2016-04-13 DIAGNOSIS — Z8711 Personal history of peptic ulcer disease: Secondary | ICD-10-CM | POA: Diagnosis not present

## 2016-04-13 DIAGNOSIS — Z7901 Long term (current) use of anticoagulants: Secondary | ICD-10-CM | POA: Diagnosis not present

## 2016-04-13 DIAGNOSIS — N179 Acute kidney failure, unspecified: Secondary | ICD-10-CM | POA: Diagnosis not present

## 2016-04-13 DIAGNOSIS — I4891 Unspecified atrial fibrillation: Secondary | ICD-10-CM | POA: Diagnosis not present

## 2016-04-13 LAB — URINALYSIS, ROUTINE W REFLEX MICROSCOPIC
BILIRUBIN URINE: NEGATIVE
Glucose, UA: NEGATIVE mg/dL
HGB URINE DIPSTICK: NEGATIVE
Ketones, ur: 15 mg/dL — AB
Leukocytes, UA: NEGATIVE
Nitrite: NEGATIVE
PROTEIN: NEGATIVE mg/dL
SPECIFIC GRAVITY, URINE: 1.02 (ref 1.005–1.030)
pH: 6 (ref 5.0–8.0)

## 2016-04-13 LAB — COMPREHENSIVE METABOLIC PANEL
ALK PHOS: 79 U/L (ref 38–126)
ALT: 17 U/L (ref 17–63)
ANION GAP: 10 (ref 5–15)
AST: 17 U/L (ref 15–41)
Albumin: 2.9 g/dL — ABNORMAL LOW (ref 3.5–5.0)
BILIRUBIN TOTAL: 1 mg/dL (ref 0.3–1.2)
BUN: 17 mg/dL (ref 6–20)
CALCIUM: 8.5 mg/dL — AB (ref 8.9–10.3)
CO2: 26 mmol/L (ref 22–32)
Chloride: 102 mmol/L (ref 101–111)
Creatinine, Ser: 1.23 mg/dL (ref 0.61–1.24)
GFR calc non Af Amer: >60 mL/min (ref >60)
Glucose, Bld: 97 mg/dL (ref 65–99)
POTASSIUM: 3.8 mmol/L (ref 3.5–5.1)
SODIUM: 138 mmol/L (ref 135–145)
TOTAL PROTEIN: 7 g/dL (ref 6.5–8.1)

## 2016-04-13 LAB — RAPID URINE DRUG SCREEN, HOSP PERFORMED
Amphetamines: NOT DETECTED
Barbiturates: NOT DETECTED
Benzodiazepines: NOT DETECTED
Cocaine: NOT DETECTED
OPIATES: POSITIVE — AB
TETRAHYDROCANNABINOL: NOT DETECTED

## 2016-04-13 LAB — CBC
HEMATOCRIT: 33.1 % — AB (ref 39.0–52.0)
HEMOGLOBIN: 10.7 g/dL — AB (ref 13.0–17.0)
MCH: 27.1 pg (ref 26.0–34.0)
MCHC: 32.3 g/dL (ref 30.0–36.0)
MCV: 83.8 fL (ref 78.0–100.0)
Platelets: 207 10*3/uL (ref 150–400)
RBC: 3.95 MIL/uL — ABNORMAL LOW (ref 4.22–5.81)
RDW: 18.1 % — AB (ref 11.5–15.5)
WBC: 5.3 10*3/uL (ref 4.0–10.5)

## 2016-04-13 LAB — LACTIC ACID, PLASMA: LACTIC ACID, VENOUS: 0.7 mmol/L (ref 0.5–1.9)

## 2016-04-13 LAB — C-REACTIVE PROTEIN: CRP: 22.1 mg/dL — AB (ref <1.0)

## 2016-04-13 LAB — SEDIMENTATION RATE: Sed Rate: 114 mm/hr — ABNORMAL HIGH (ref 0–16)

## 2016-04-13 MED ORDER — METOPROLOL TARTRATE 12.5 MG HALF TABLET
12.5000 mg | ORAL_TABLET | Freq: Two times a day (BID) | ORAL | Status: DC
  Filled 2016-04-13: qty 1

## 2016-04-13 MED ORDER — KETOROLAC TROMETHAMINE 15 MG/ML IJ SOLN
15.0000 mg | Freq: Three times a day (TID) | INTRAMUSCULAR | Status: DC | PRN
  Administered 2016-04-13 – 2016-04-14 (×2): 15 mg via INTRAVENOUS
  Filled 2016-04-13 (×2): qty 1

## 2016-04-13 MED ORDER — HYDROCORTISONE NA SUCCINATE PF 100 MG IJ SOLR
100.0000 mg | Freq: Three times a day (TID) | INTRAMUSCULAR | Status: DC
  Administered 2016-04-13 – 2016-04-14 (×3): 100 mg via INTRAVENOUS
  Filled 2016-04-13 (×3): qty 2

## 2016-04-13 NOTE — Progress Notes (Signed)
PROGRESS NOTE    Samuel Flynn  MWU:[id number] DOB: Aug 21, 1976 DOA: 04/10/2016 PCP: Marcial Pacas, DO   Brief Narrative:  40 y.o. WM PMHx Atrial Fibrillation with RVR Dx 10/2015, HTN, OSA, Obesity, Gastric ulcer,.   Presents to the emergency department early this morning with bilateral lower extremity swelling and pain, shortness of breath and rapid heart rate. Patient has history of gout, takes daily Zyloprim. Approximate 2 weeks ago he began having pain in the top of his right foot. He's had intermittent swelling of his right lower extremity over the last couple of weeks. 2 days ago the right foot became markedly swollen and erythematous. He was unable to bear weight on the foot because of pain. Fevers and chills at home over the last couple of days. Similar symptoms in July but symptoms improved with gout medication and ibuprofen. No trauma to the right lower extremity. Today patient tells me that his heart started"acting out". Girlfriend noticed that patient was coughing which he often does when in atrial fibrillation. Patient also noticed that his heart rate was fast and irregular. Two weeks ago patient described swollen uvula which resolved with ibuprofen   Subjective: no improvement in right foot edema/erythema/pain with limited range of motion right toes due to pain. Also reported decreased sensation of toes, report increasing pain right foot Had bradycardia during sleep last night, hr rate down to 39 briefly, heart rate in 50's when awake, denies sob, no dizziness, blood pressure stable     Assessment & Plan:   Active Problems:   Gout   Essential hypertension, benign   Atrial fibrillation with rapid ventricular response (HCC)   Atrial fibrillation with RVR (HCC)   Cellulitis of foot, right   Elevated troponin I level   Paroxysmal AFib with RVR.( Chadsvasc 2).           -Atrial fibrillation order set utilized -Resolved  -Metoprolol 50 mg daily  -continue  Xarelto 20 mg  daily -cardiology input appreciated  Sinus bradycardia: decrease betablocker dose  Elevated troponin, - 0.15, ? Demand from Afib. Cardiology has signed off no further workup required.  Right foot cellulitis. Non-purulent- -Cellulitis order set utilized -Ancef 2gms Q8 hours -blood cultures no growth, MRSA screening pending -persistent right foot edema, more on the dorsal aspect,  [place] of right foot no deep infection/abscess ,  venous doppler no DVt,  foot x ray, Significant soft tissue swelling along the dorsum of the foot, not associated with acute fracture or cortical erosion. No radiopaque foreign body or soft tissue gas. to r/o osteo, patient does has elevated esr/crp Elevate foot, continue abx, add steroids, I have discussed with orthopedics Dr Lyla Glassing who reviewed the imaging and recommended ortho boot to right foot and outpatient follow up with orthopedics Dr Sharol Given in one week  Gout.   -Colchicine 0.6 mg daily  -Uric acid 6.9  Cr elevation/AKI: will get ua, renal dosing meds, cr improved with gentle hydrationx24hrs   DVT prophylaxis: Xarelto Code Status: Full Family Communication: None Disposition Plan: Resolution of cellulitis   Consultants:  Dr[facility] cardiology Orthopedics Dr Lyla Glassing    Procedures/Significant Events:  NA  Cultures 8/7 blood right/left AC NGTD 8/7 MRSA by PCR negative   Antimicrobials: Ancef 8/7>>    Continuous Infusions: . sodium chloride 75 mL/hr at 04/13/16 0455     Objective: Vitals:   04/12/16 2310 04/13/16 0634 04/13/16 1240 04/13/16 1318  BP:  (!) 127/59 (!) 147/85 (!) 149/73  Pulse: 62 (!) 44  Marland Kitchen)  57  Resp: '14 16  16  '$ Temp:  97.6 F (36.4 C)  98.4 F (36.9 C)  TempSrc:  Axillary  Oral  SpO2: 98% 98%  99%  Weight:      Height:        Intake/Output Summary (Last 24 hours) at 04/13/16 1343 Last data filed at 04/13/16 1158  Gross per 24 hour  Intake             1440 ml  Output             2150 ml    Net             -710 ml   Filed Weights   04/10/16 0714 04/10/16 1404 04/11/16 1543  Weight: 127 kg (280 lb) 124.6 kg (274 lb 11.1 oz) 122.2 kg (269 lb 4.8 oz)    Examination:  General: A/O 4, left lower extremity pain, No acute respiratory distress Eyes: negative scleral hemorrhage, negative anisocoria, negative icterus ENT: Negative Runny nose, negative gingival bleeding, Neck:  Negative scars, masses, torticollis, lymphadenopathy, JVD Lungs: Clear to auscultation bilaterally without wheezes or crackles Cardiovascular: Regular rate and rhythm without murmur gallop or rub normal S1 and S2 Abdomen: negative abdominal pain, nondistended, positive soft, bowel sounds, no rebound, no ascites, no appreciable mass Extremities: No significant cyanosis, clubbing, RLL, swelling, erythema, painful to touch, negative rubor, positive DP/PT pulse. Skin: Negative rashes, lesions, ulcers Psychiatric:  Negative depression, negative anxiety, negative fatigue, negative mania  Central nervous system:  Cranial nerves II through XII intact, tongue/uvula midline, all extremities muscle strength 5/5, sensation intact throughout,negative dysarthria, negative expressive aphasia, negative receptive aphasia.  .     Data Reviewed: Care during the described time interval was provided by me .  I have reviewed this patient's available data, including medical history, events of note, physical examination, and all test results as part of my evaluation. I have personally reviewed and interpreted all radiology studies.  CBC:  Recent Labs Lab 04/10/16 0800 04/11/16 0443 04/13/16 0551  WBC 12.9* 10.4 5.3  NEUTROABS 11.6*  --   --   HGB 12.4* 10.7* 10.7*  HCT 38.0* 33.4* 33.1*  MCV 84.3 85.2 83.8  PLT 197 192 709   Basic Metabolic Panel:  Recent Labs Lab 04/10/16 0800 04/11/16 0443 04/13/16 0551  NA 138 136 138  K 4.2 4.0 3.8  CL 107 101 102  CO2 '24 24 26  '$ GLUCOSE 107* 99 97  BUN '14 15 17   '$ CREATININE 1.50* 1.70* 1.23  CALCIUM 8.6* 8.4* 8.5*  MG 2.2  --   --    GFR: Estimated Creatinine Clearance: 109.8 mL/min (by C-G formula based on SCr of 1.23 mg/dL). Liver Function Tests:  Recent Labs Lab 04/10/16 0800 04/13/16 0551  AST 21 17  ALT 31 17  ALKPHOS 74 79  BILITOT 2.2* 1.0  PROT 7.1 7.0  ALBUMIN 3.6 2.9*   No results for input(s): LIPASE, AMYLASE in the last 168 hours. No results for input(s): AMMONIA in the last 168 hours. Coagulation Profile: No results for input(s): INR, PROTIME in the last 168 hours. Cardiac Enzymes:  Recent Labs Lab 04/10/16 0800 04/10/16 1601 04/10/16 2204  TROPONINI 0.15* <0.03 <0.03   BNP (last 3 results) No results for input(s): PROBNP in the last 8760 hours. HbA1C: No results for input(s): HGBA1C in the last 72 hours. CBG: No results for input(s): GLUCAP in the last 168 hours. Lipid Profile: No results for input(s): CHOL, HDL, LDLCALC, TRIG, CHOLHDL,  LDLDIRECT in the last 72 hours. Thyroid Function Tests: No results for input(s): TSH, T4TOTAL, FREET4, T3FREE, THYROIDAB in the last 72 hours. Anemia Panel: No results for input(s): VITAMINB12, FOLATE, FERRITIN, TIBC, IRON, RETICCTPCT in the last 72 hours. Urine analysis:    Component Value Date/Time   COLORURINE YELLOW 11/06/2014 1352   APPEARANCEUR HAZY (A) 11/06/2014 1352   LABSPEC 1.015 11/06/2014 1352   PHURINE 5.5 11/06/2014 1352   GLUCOSEU NEGATIVE 11/06/2014 1352   HGBUR LARGE (A) 11/06/2014 1352   HGBUR negative 08/26/2007 1203   BILIRUBINUR NEGATIVE 11/06/2014 1352   KETONESUR NEGATIVE 11/06/2014 1352   PROTEINUR 30 (A) 11/06/2014 1352   UROBILINOGEN 1.0 11/06/2014 1352   NITRITE NEGATIVE 11/06/2014 1352   LEUKOCYTESUR NEGATIVE 11/06/2014 1352   Sepsis Labs: '@LABRCNTIP'$ (procalcitonin:4,lacticidven:4)  ) Recent Results (from the past 240 hour(s))  Culture, blood (routine x 2)     Status: None (Preliminary result)   Collection Time: 04/10/16  7:21 AM   Result Value Ref Range Status   Specimen Description BLOOD RIGHT ANTECUBITAL  Final   Special Requests BOTTLES DRAWN AEROBIC AND ANAEROBIC 5CC  Final   Culture NO GROWTH 2 DAYS  Final   Report Status PENDING  Incomplete  Culture, blood (routine x 2)     Status: None (Preliminary result)   Collection Time: 04/10/16  7:45 AM  Result Value Ref Range Status   Specimen Description BLOOD LEFT ANTECUBITAL  Final   Special Requests   Final    BOTTLES DRAWN AEROBIC AND ANAEROBIC 10CC AER 5CC ANA   Culture NO GROWTH 2 DAYS  Final   Report Status PENDING  Incomplete  MRSA PCR Screening     Status: None   Collection Time: 04/10/16  2:08 PM  Result Value Ref Range Status   MRSA by PCR NEGATIVE NEGATIVE Final    Comment:        The GeneXpert MRSA Assay (FDA approved for NASAL specimens only), is one component of a comprehensive MRSA colonization surveillance program. It is not intended to diagnose MRSA infection nor to guide or monitor treatment for MRSA infections.          Radiology Studies: [place] Extrem Low Right Comp  Result Date: 04/13/2016 CLINICAL DATA:  Acute onset of pain, erythema and swelling at the dorsum of the right foot. Initial encounter. EXAM: ULTRASOUND RIGHT LOWER EXTREMITY LIMITED TECHNIQUE: Ultrasound examination of the lower extremity soft tissues was performed in the area of clinical concern. COMPARISON:  Right foot radiographs performed earlier today at 6:27 p.m. FINDINGS: At the dorsum of the right foot, note is made of diffuse soft tissue edema. No focal fluid collection is seen to suggest abscess. There is no evidence of significant hyperemia. Underlying structures are grossly unremarkable in appearance, though not well assessed. IMPRESSION: Diffuse soft tissue edema noted at the dorsum of the right foot. No evidence of abscess or significant hyperemia. Electronically Signed   By: [name] M.D.   On: 04/13/2016 02:15   Dg Foot 2 Views Right  Result Date:  04/12/2016 CLINICAL DATA:  Pain and redness and swelling dorsal surface of all metacarpals and tarsals of the right foot. EXAM: RIGHT FOOT - 2 VIEW COMPARISON:  None. FINDINGS: Significant soft tissue swelling along the dorsum of the foot, not associated with acute fracture or cortical erosion. No radiopaque foreign body or soft tissue gas. IMPRESSION: Negative. Electronically Signed   By: Nolon Nations M.D.   On: 04/12/2016 18:50  Scheduled Meds: . allopurinol  100 mg Oral Daily  .  ceFAZolin (ANCEF) IV  2 g Intravenous Q8H  . hydrocortisone sod succinate (SOLU-CORTEF) inj  100 mg Intravenous Q8H  . lisinopril  20 mg Oral Daily  . [START ON 04/14/2016] metoprolol tartrate  12.5 mg Oral BID  . pantoprazole  40 mg Oral Daily  . rivaroxaban  20 mg Oral Q supper  . sodium chloride flush  3 mL Intravenous Q12H   Continuous Infusions: . sodium chloride 75 mL/hr at 04/13/16 0455     LOS: 0 days    Time spent: 40 minutes    Wanna Gully, MD PhD [facility] Pager [phone]  If 7PM-7AM, please contact night-coverage [email or web address] Password [email or web address] 04/13/2016, 1:43 PM

## 2016-04-13 NOTE — Care Management Note (Signed)
Case Management Note  Patient Details  Name: Samuel Flynn MRN: [id number] Date of Birth: Jun 06, 1976  Subjective/Objective:     Presents with cellulitis R foot, hx of Atrial Fibrillation with RVR Dx 10/2015, HTN, OSA, Obesity, Gastric ulcer,.    Action/Plan: Return to home when medically stable. CM to f/u with disposition needs.  Expected Discharge Date:                  Expected Discharge Plan:  Home/Self Care (Resides with girlfriend, Juliann Pulse)  In-House Referral:     Discharge planning Services  CM Consult  Post Acute Care Choice:    Choice offered to:     DME Arranged:    DME Agency:     HH Arranged:    HH Agency:     Status of Service:  In process, will continue to follow  If discussed at Long Length of Stay Meetings, dates discussed:    Additional Comments:  Sharin Mons, RN 04/13/2016, 11:50 AM

## 2016-04-13 NOTE — Progress Notes (Signed)
Orthopedic Tech Progress Note Patient Details:  Samuel Flynn [birth year] [id number]  Ortho Devices Type of Ortho Device: CAM walker Ortho Device/Splint Location: rle Ortho Device/Splint Interventions: Application   Samuel Flynn 04/13/2016, 2:24 PM

## 2016-04-13 NOTE — Progress Notes (Addendum)
**  Preliminary report by tech**  Right lower extremity venous duplex completed. There is no evidence of deep or superficial vein thrombosis involving the right lower extremity. All visualized vessels appear patent and compressible. There is no evidence of a Baker's cyst on the right. Incidental finding in the right groin of an enlarged, vasculated lymph node.  04/13/16 11:11 AM Samuel Flynn RVT

## 2016-04-13 NOTE — Progress Notes (Signed)
HR ranging from 39-50's this am while asleep. Holding Metoprolol this am. Made Dr Erlinda Hong aware.  Once awake, HR 51. Will continue to monitor pt. Pt is A&O X4.

## 2016-04-14 LAB — URINE CULTURE: CULTURE: NO GROWTH

## 2016-04-14 MED ORDER — METOPROLOL TARTRATE 25 MG PO TABS: 12.5000 mg | ORAL_TABLET | Freq: Two times a day (BID) | ORAL | 0 refills | Status: DC

## 2016-04-14 MED ORDER — PREDNISONE 10 MG PO TABS: 10.0000 mg | ORAL_TABLET | Freq: Every day | ORAL | 0 refills | Status: DC

## 2016-04-14 MED ORDER — DOXYCYCLINE HYCLATE 100 MG PO CAPS: 100.0000 mg | ORAL_CAPSULE | Freq: Two times a day (BID) | ORAL | 0 refills | Status: DC

## 2016-04-14 NOTE — Discharge Summary (Signed)
Discharge Summary  Samuel Flynn [id number] DOB: Mar 17, 1976  PCP: Samuel Pacas, DO  Admit date: 04/10/2016 Discharge date: 04/14/2016  Time spent: <15mns  Recommendations for Outpatient Follow-up:  1. F/u with PMD within a week  for hospital discharge follow up, repeat cbc/bmp/esr/crp at follow up 2. F/u with orthopedics Dr DSharol Givenfor right foot pain 3. F/u with cardiology for afib and bradycardia  Discharge Diagnoses:  Active Hospital Problems   Diagnosis Date Noted  . Cellulitis of right foot 04/13/2016  . Atrial [place] with RVR ([place] 04/10/2016  . Cellulitis of foot, right 04/10/2016  . Elevated troponin I level 04/10/2016  . Atrial fibrillation with rapid ventricular response ([facility] 10/18/2015  . Essential hypertension, benign 02/19/2014  . Gout 12/27/2006    Resolved Hospital Problems   Diagnosis Date Noted Date Resolved  No resolved problems to display.    Discharge Condition: stable  Diet recommendation: heart healthy  Filed Weights   04/10/16 0714 04/10/16 1404 04/11/16 1543  Weight: 127 kg (280 lb) 124.6 kg (274 lb 11.1 oz) 122.2 kg (269 lb 4.8 oz)    History of present illness:  Chief Complaint: rapid heartrate, bilateral leg swelling / pain  HPI: Samuel RUDYis a 40y.o. male with a medical history significant for, but not  limited to,  atrial fibrillation with RVR diagnosed in February of this year, OSA,  hypertension, and obesity. Presents to the emergency department early this morning with bilateral lower extremity swelling and pain, shortness of breath and rapid heart rate. Patient has history of gout, takes daily Zyloprim. Approximate 2 weeks ago he began having pain in the top of his right foot. He's had intermittent swelling of his right lower extremity over the last couple of weeks. 2 days ago the right foot became markedly swollen and erythematous. He was unable to bear weight on the foot because of pain. Fevers and chills at home  over the last couple of days. Similar symptoms in July but symptoms improved with gout medication and ibuprofen. No trauma to the right lower extremity. Today patient tells me that his heart started"acting out". Girlfriend noticed that patient was coughing which he often does when in atrial fibrillation. Patient also noticed that his heart rate was fast and irregular. Two weeks ago patient described swollen uvula which resolved with ibuprofen  ED Course:  MAXIMUM TEMPERATURE 100.3 3, heart rate130-140, normotensive. O2 saturation normal nasal cannula Creatinine 1.5, baseline normal 1.1.  Bili 2.2, LFTs otherwise unremarkable magnesium and potassium WNL. TSH, free T4 WNL, troponin 0.15 BNP 143 , wbc 12 point 9, hemoglobin 12.4, lactic acid normal.  Blood cultures pending EKG reveals atrial fibrillation, heart rate 151, QTC 447 No acute findings on chest x-ray  IV vanco, Cardizem gtt, liter bolus, tylenol and dilaudid  Hospital Course:  Active Problems:   Gout   Essential hypertension, benign   Atrial fibrillation with rapid ventricular response (HCC)   Atrial fibrillation with RVR (HCC)   Cellulitis of foot, right   Elevated troponin I level   Cellulitis of right foot   Paroxysmal AFib with RVR.(Chadsvasc 2).  -Resolved , sinus rhythm at discharge, he is discharged on decreased dose of betablocker due to bradycardia episodes during sleep --continue  Xarelto 20 mg daily -cardiology consulted, input appreciated, patient is to continue outpatient cardiology follow up for meds adjustment  Sinus bradycardia: decrease betablocker dose, cardiology follow up  Elevated troponin, - 0.15, likely Demand from Afib. Cardiology has signed off no further workup  required.  Right foot cellulitis. Non-purulent- possible component of gout flare up -blood cultures no growth, MRSA screening negative -Ancef 2gms Q8 hours started from admission --persistent right foot edema, more on the  dorsal aspect,  [place] of right foot no deep infection/abscess ,  venous doppler no DVt,  foot x ray, Significant soft tissue swelling along the dorsum of the foot, not associated with acute fracture or cortical erosion. No radiopaque foreign body or soft tissue gas. to r/o osteo, patient does has elevated esr/crp -Elevate foot, continue abx, add steroids, I have discussed with orthopedics Dr Lyla Glassing who reviewed the imaging and recommended ortho boot to right foot and outpatient follow up with orthopedics Dr Sharol Given in one week -foot edema/erythema much improved at discharge, he is discharged on steroids taper/doxycycline/cam boot and ortho follow up  Gout.  -Colchicine 0.6 mg and allopurinol  daily  -Uric acid 6.9  Cr elevation/AKI:  Ua unremarkable, renal dosing meds, cr improved with gentle hydrationx24hrs Repeat lab at hospital follow up   DVT prophylaxis: Xarelto Code Status: Full Family Communication: None Disposition Plan: home on 8/11   Consultants:  Dr.[facility] cardiology Orthopedics Dr Lyla Glassing    Procedures/Significant Events:  NA  Cultures 8/7 blood right/left AC NGTD 8/7 MRSA by PCR negative   Antimicrobials: Ancef 8/7>>   Discharge Exam: BP 119/65 (BP Location: Left Arm)   Pulse (!) 51   Temp 98 F (36.7 C) (Oral)   Resp 17   Ht '6\' 2"'$  (1.88 m)   Wt 122.2 kg (269 lb 4.8 oz)   SpO2 100%   BMI 34.58 kg/m    General: A/O 4, left lower extremity pain, No acute respiratory distress Eyes: negative scleral hemorrhage, negative anisocoria, negative icterus ENT: Negative Runny nose, negative gingival bleeding, Neck:  Negative scars, masses, torticollis, lymphadenopathy, JVD Lungs: Clear to auscultation bilaterally without wheezes or crackles Cardiovascular: Regular rate and rhythm without murmur gallop or rub normal S1 and S2 Abdomen: negative abdominal pain, nondistended, positive soft, bowel sounds, no rebound, no ascites, no  appreciable mass Extremities: No significant cyanosis, clubbing, RLL erythema and edema has resolved at discharge. Right foot less erythema/edema, increased range of motion in toes, positive DP/PT pulse. Skin: Negative rashes, lesions, ulcers Psychiatric:  Negative depression, negative anxiety, negative fatigue, negative mania  Central nervous system:  Cranial nerves II through XII intact, tongue/uvula midline, all extremities muscle strength 5/5, sensation intact throughout,negative dysarthria, negative expressive aphasia, negative receptive aphasia.   Discharge Instructions You were cared for by a hospitalist during your hospital stay. If you have any questions about your discharge medications or the care you received while you were in the hospital after you are discharged, you can call the unit and asked to speak with the hospitalist on call if the hospitalist that took care of you is not available. Once you are discharged, your primary care physician will handle any further medical issues. Please note that NO REFILLS for any discharge medications will be authorized once you are discharged, as it is imperative that you return to your primary care physician (or establish a relationship with a primary care physician if you do not have one) for your aftercare needs so that they can reassess your need for medications and monitor your lab values.  Discharge Instructions    Diet - low sodium heart healthy    Complete by:  As directed   Increase activity slowly    Complete by:  As directed  Medication List    STOP taking these medications   amLODipine 10 MG tablet Commonly known as:  NORVASC   diltiazem 120 MG 24 hr capsule Commonly known as:  CARDIZEM CD   docusate sodium 100 MG capsule Commonly known as:  COLACE   metoprolol succinate 50 MG 24 hr tablet Commonly known as:  TOPROL-XL   ondansetron 4 MG disintegrating tablet Commonly known as:  ZOFRAN ODT   sucralfate 1 g  tablet Commonly known as:  CARAFATE     TAKE these medications   allopurinol 100 MG tablet Commonly known as:  ZYLOPRIM Take 100 mg by mouth daily. For gout   colchicine 0.6 MG tablet Take 0.6 mg by mouth daily as needed (gout flare).   doxycycline 100 MG capsule Commonly known as:  VIBRAMYCIN Take 1 capsule (100 mg total) by mouth 2 (two) times daily.   ibuprofen 200 MG tablet Commonly known as:  ADVIL,MOTRIN Take 800 mg by mouth every 6 (six) hours as needed for headache or moderate pain.   lisinopril 20 MG tablet Commonly known as:  PRINIVIL,ZESTRIL Take 20 mg by mouth daily. For blood pressure   metoprolol tartrate 25 MG tablet Commonly known as:  LOPRESSOR Take 0.5 tablets (12.5 mg total) by mouth 2 (two) times daily.   pantoprazole 40 MG tablet Commonly known as:  PROTONIX TAKE 1 TABLET (40 MG TOTAL) BY MOUTH DAILY. What changed:  additional instructions   predniSONE 10 MG tablet Commonly known as:  DELTASONE Take 1 tablet (10 mg total) by mouth daily with breakfast. Label  & dispense according to the schedule below. 6Pills PO on day one then, 5 Pills PO on day two, 4 Pills PO on day three, 3 Pills PO on day four, 2 Pills PO on day five, 1 Pills PO on day six,  then STOP.   rivaroxaban 20 MG Tabs tablet Commonly known as:  XARELTO Take 1 tablet (20 mg total) by mouth daily with supper.      No Known Allergies Follow-up Information    DUDA,MARCUS V, MD Follow up in 1 week(s).   Specialty:  Orthopedic Surgery Why:  right foot pain Contact information: [place] [address] [phone]        Skeet Latch, MD Follow up in 2 week(s).   Specialty:  Cardiology Why:  afib and bradycardia  Contact information: [address] [place] [address] [phone]        Samuel Pacas, DO Follow up in 1 week(s).   Specialty:  Family Medicine Why:  hospital discharge follow up, repeat cbc/bmp/crp/esr at follow  up. Contact information: [place] [address] [phone]            The results of significant diagnostics from this hospitalization (including imaging, microbiology, ancillary and laboratory) are listed below for reference.    Significant Diagnostic Studies: [place] Extrem Low Right Comp  Result Date: 04/13/2016 CLINICAL DATA:  Acute onset of pain, erythema and swelling at the dorsum of the right foot. Initial encounter. EXAM: ULTRASOUND RIGHT LOWER EXTREMITY LIMITED TECHNIQUE: Ultrasound examination of the lower extremity soft tissues was performed in the area of clinical concern. COMPARISON:  Right foot radiographs performed earlier today at 6:27 p.m. FINDINGS: At the dorsum of the right foot, note is made of diffuse soft tissue edema. No focal fluid collection is seen to suggest abscess. There is no evidence of significant hyperemia. Underlying structures are grossly unremarkable in appearance, though not  well assessed. IMPRESSION: Diffuse soft tissue edema noted at the dorsum of the right foot. No evidence of abscess or significant hyperemia. Electronically Signed   By: [name] M.D.   On: 04/13/2016 02:15   Dg Chest Port 1 View  Result Date: 04/10/2016 CLINICAL DATA:  Bilateral lower extremity swelling for 1 week. Increased heart rate and shortness of breath for 2 days. EXAM: PORTABLE CHEST 1 VIEW COMPARISON:  Single-view of the chest 10/18/2015. FINDINGS: The lungs are clear. Heart size is normal. There is no pneumothorax or pleural effusion. No focal bony abnormality. IMPRESSION: No acute disease. Electronically Signed   By: Inge Rise M.D.   On: 04/10/2016 08:21   Dg Foot 2 Views Right  Result Date: 04/12/2016 CLINICAL DATA:  Pain and redness and swelling dorsal surface of all metacarpals and tarsals of the right foot. EXAM: RIGHT FOOT - 2 VIEW COMPARISON:  None. FINDINGS: Significant soft tissue swelling along the dorsum of the foot, not associated with  acute fracture or cortical erosion. No radiopaque foreign body or soft tissue gas. IMPRESSION: Negative. Electronically Signed   By: Nolon Nations M.D.   On: 04/12/2016 18:50    Microbiology: Recent Results (from the past 240 hour(s))  Culture, blood (routine x 2)     Status: None (Preliminary result)   Collection Time: 04/10/16  7:21 AM  Result Value Ref Range Status   Specimen Description BLOOD RIGHT ANTECUBITAL  Final   Special Requests BOTTLES DRAWN AEROBIC AND ANAEROBIC 5CC  Final   Culture NO GROWTH 3 DAYS  Final   Report Status PENDING  Incomplete  Culture, blood (routine x 2)     Status: None (Preliminary result)   Collection Time: 04/10/16  7:45 AM  Result Value Ref Range Status   Specimen Description BLOOD LEFT ANTECUBITAL  Final   Special Requests   Final    BOTTLES DRAWN AEROBIC AND ANAEROBIC 10CC AER 5CC ANA   Culture NO GROWTH 3 DAYS  Final   Report Status PENDING  Incomplete  MRSA PCR Screening     Status: None   Collection Time: 04/10/16  2:08 PM  Result Value Ref Range Status   MRSA by PCR NEGATIVE NEGATIVE Final    Comment:        The GeneXpert MRSA Assay (FDA approved for NASAL specimens only), is one component of a comprehensive MRSA colonization surveillance program. It is not intended to diagnose MRSA infection nor to guide or monitor treatment for MRSA infections.      Labs: Basic Metabolic Panel:  Recent Labs Lab 04/10/16 0800 04/11/16 0443 04/13/16 0551  NA 138 136 138  K 4.2 4.0 3.8  CL 107 101 102  CO2 '24 24 26  '$ GLUCOSE 107* 99 97  BUN '14 15 17  '$ CREATININE 1.50* 1.70* 1.23  CALCIUM 8.6* 8.4* 8.5*  MG 2.2  --   --    Liver Function Tests:  Recent Labs Lab 04/10/16 0800 04/13/16 0551  AST 21 17  ALT 31 17  ALKPHOS 74 79  BILITOT 2.2* 1.0  PROT 7.1 7.0  ALBUMIN 3.6 2.9*   No results for input(s): LIPASE, AMYLASE in the last 168 hours. No results for input(s): AMMONIA in the last 168 hours. CBC:  Recent Labs Lab  04/10/16 0800 04/11/16 0443 04/13/16 0551  WBC 12.9* 10.4 5.3  NEUTROABS 11.6*  --   --   HGB 12.4* 10.7* 10.7*  HCT 38.0* 33.4* 33.1*  MCV 84.3 85.2 83.8  PLT 197  192 207   Cardiac Enzymes:  Recent Labs Lab 04/10/16 0800 04/10/16 1601 04/10/16 2204  TROPONINI 0.15* <0.03 <0.03   BNP: BNP (last 3 results)  Recent Labs  04/10/16 0800  BNP 143.9*    ProBNP (last 3 results) No results for input(s): PROBNP in the last 8760 hours.  CBG: No results for input(s): GLUCAP in the last 168 hours.     SignedFlorencia Reasons MD, PhD  [facility] 04/14/2016, 10:43 AM

## 2016-04-14 NOTE — Progress Notes (Signed)
NURSING PROGRESS NOTE  DELONTAE SALIB [id number] Discharge Data: 04/14/2016 10:39 AM Attending Provider: Florencia Reasons, MD [id number], Hilliard Clark, DO     Isa Rankin Trivett to be D/C'd Home per MD order.  Discussed with the patient the After Visit Summary and all questions fully answered. All IV's discontinued with no bleeding noted. All belongings returned to patient for patient to take home.   Last Vital Signs:  Blood pressure 119/65, pulse (!) 51, temperature 98 F (36.7 C), temperature source Oral, resp. rate 17, height 6\' 2"  (1.88 m), weight 122.2 kg (269 lb 4.8 oz), SpO2 100 %.  Discharge Medication List   Medication List    STOP taking these medications   amLODipine 10 MG tablet Commonly known as:  NORVASC   diltiazem 120 MG 24 hr capsule Commonly known as:  CARDIZEM CD   docusate sodium 100 MG capsule Commonly known as:  COLACE   metoprolol succinate 50 MG 24 hr tablet Commonly known as:  TOPROL-XL   ondansetron 4 MG disintegrating tablet Commonly known as:  ZOFRAN ODT   sucralfate 1 g tablet Commonly known as:  CARAFATE     TAKE these medications   allopurinol 100 MG tablet Commonly known as:  ZYLOPRIM Take 100 mg by mouth daily. For gout   colchicine 0.6 MG tablet Take 0.6 mg by mouth daily as needed (gout flare).   doxycycline 100 MG capsule Commonly known as:  VIBRAMYCIN Take 1 capsule (100 mg total) by mouth 2 (two) times daily.   ibuprofen 200 MG tablet Commonly known as:  ADVIL,MOTRIN Take 800 mg by mouth every 6 (six) hours as needed for headache or moderate pain.   lisinopril 20 MG tablet Commonly known as:  PRINIVIL,ZESTRIL Take 20 mg by mouth daily. For blood pressure   metoprolol tartrate 25 MG tablet Commonly known as:  LOPRESSOR Take 0.5 tablets (12.5 mg total) by mouth 2 (two) times daily.   pantoprazole 40 MG tablet Commonly known as:  PROTONIX TAKE 1 TABLET (40 MG TOTAL) BY MOUTH DAILY. What changed:  additional instructions    predniSONE 10 MG tablet Commonly known as:  DELTASONE Take 1 tablet (10 mg total) by mouth daily with breakfast. Label  & dispense according to the schedule below. 6Pills PO on day one then, 5 Pills PO on day two, 4 Pills PO on day three, 3 Pills PO on day four, 2 Pills PO on day five, 1 Pills PO on day six,  then STOP.   rivaroxaban 20 MG Tabs tablet Commonly known as:  XARELTO Take 1 tablet (20 mg total) by mouth daily with supper.        Charolette Child, RN

## 2016-04-14 NOTE — Care Management Note (Signed)
Case Management Note  Patient Details  Name: Samuel Flynn MRN: [id number] Date of Birth: November 14, 1975  Subjective/Objective:                    Action/Plan: Plan to d/c to home today.  Expected Discharge Date:                  Expected Discharge Plan:  Home/Self Care (Resides with girlfriend, Juliann Pulse)  In-House Referral:     Discharge planning Services  CM Consult  Status of Service:  Completed, signed off  If discussed at [place] of Stay Meetings, dates discussed:    Additional Comments:  Sharin Mons, RN 04/14/2016, 11:40 AM

## 2016-04-15 LAB — CULTURE, BLOOD (ROUTINE X 2)
CULTURE: NO GROWTH
Culture: NO GROWTH

## 2016-04-25 ENCOUNTER — Inpatient Hospital Stay: Payer: PRIVATE HEALTH INSURANCE | Admitting: Family Medicine

## 2016-05-09 ENCOUNTER — Ambulatory Visit: Payer: PRIVATE HEALTH INSURANCE | Admitting: Physician Assistant

## 2016-05-09 NOTE — Progress Notes (Deleted)
Cardiology Office Note   Date:  05/09/2016   ID:  Samuel Flynn, DOB May 05, 1976, MRN [id number]  PCP:  Marcial Pacas, DO  Cardiologist:  Dr Oliver Barre, PA-C   No chief complaint on file.   History of Present Illness: Samuel Flynn is a 40 y.o. male with a history of HTN, obesity, OSA, PAF (dx 10/2015), on Xarelto w/ CHADS2VASC=1 (HTN). Echo revealed LVEF 123456 without diastolic dysfunction. Metoprolol changed to Dilt because of fatigue. Had CP>>MV w/ EF 52%, no scar or ischemia  D/c 08/11 after admit for LE cellulits, was seen by cards for afib RVR and D-CHF. 1 dose IV Lasix. Converted spont to [name] presents for ***   Past Medical History:  Diagnosis Date  . Atrial fibrillation with RVR ([place])   . Cellulitis 04/2016   RT FOOT  . Gastric ulcer   . Gout   . Hypertension   . Paroxysmal a-fib ([place])   . Sleep apnea    USES CPAP  . Wears glasses     Past Surgical History:  Procedure Laterality Date  . ELBOW LIGAMENT RECONSTRUCTION Right 09/29/2014   Procedure: CORANOID FRACTURE FIXATION, POSSIBLE LIGAMENT REPAIR;  Surgeon: Nita Sells, MD;  Location: [facility];  Service: Orthopedics;  Laterality: Right;  Right elbow coranoid fracture fixation, possible ligament repair  . WISDOM TOOTH EXTRACTION      Current Outpatient Prescriptions  Medication Sig Dispense Refill  . allopurinol (ZYLOPRIM) 100 MG tablet Take 100 mg by mouth daily. For gout    . colchicine 0.6 MG tablet Take 0.6 mg by mouth daily as needed (gout flare).   1  . doxycycline (VIBRAMYCIN) 100 MG capsule Take 1 capsule (100 mg total) by mouth 2 (two) times daily. 10 capsule 0  . ibuprofen (ADVIL,MOTRIN) 200 MG tablet Take 800 mg by mouth every 6 (six) hours as needed for headache or moderate pain.    Marland Kitchen lisinopril (PRINIVIL,ZESTRIL) 20 MG tablet Take 20 mg by mouth daily. For blood pressure    . metoprolol tartrate (LOPRESSOR) 25 MG tablet  Take 0.5 tablets (12.5 mg total) by mouth 2 (two) times daily. 60 tablet 0  . pantoprazole (PROTONIX) 40 MG tablet TAKE 1 TABLET (40 MG TOTAL) BY MOUTH DAILY. 30 tablet 3  . predniSONE (DELTASONE) 10 MG tablet Take 1 tablet (10 mg total) by mouth daily with breakfast. Label  & dispense according to the schedule below. 6Pills PO on day one then, 5 Pills PO on day two, 4 Pills PO on day three, 3 Pills PO on day four, 2 Pills PO on day five, 1 Pills PO on day six,  then STOP. 21 tablet 0  . rivaroxaban (XARELTO) 20 MG TABS tablet Take 1 tablet (20 mg total) by mouth daily with supper. 30 tablet 5   No current facility-administered medications for this visit.     Allergies:   Review of patient's allergies indicates no known allergies.    Social History:  The patient  reports that he quit smoking about 3 months ago. His smoking use included Cigars. He has never used smokeless tobacco. He reports that he drinks alcohol. He reports that he does not use drugs.   Family History:  The patient's family history includes Hypertension in his maternal grandmother, paternal uncle, paternal uncle, paternal uncle, paternal uncle, and paternal uncle.    ROS:  Please see the history of present illness. All other systems are reviewed  and negative.    PHYSICAL EXAM: VS:  There were no vitals taken for this visit. , BMI There is no height or weight on file to calculate BMI. GEN: Well nourished, well developed, male in no acute distress  HEENT: normal for age  Neck: no JVD, no carotid bruit, no masses Cardiac: RRR; no murmur, no rubs, or gallops Respiratory:  clear to auscultation bilaterally, normal work of breathing GI: soft, nontender, nondistended, + BS MS: no deformity or atrophy; no edema; distal pulses are 2+ in all 4 extremities   Skin: warm and dry, no rash Neuro:  Strength and sensation are intact Psych: euthymic mood, full affect   EKG:  EKG {ACTION; IS/IS [id number] ordered today. The ekg  ordered today demonstrates ***   Recent Labs: 04/10/2016: B Natriuretic Peptide 143.9; Magnesium 2.2; TSH 2.040 04/13/2016: ALT 17; BUN 17; Creatinine, Ser 1.23; Hemoglobin 10.7; Platelets 207; Potassium 3.8; Sodium 138    Lipid Panel    Component Value Date/Time   CHOL 211 (H) 10/19/2015 0500   TRIG 273 (H) 10/19/2015 0500   HDL 32 (L) 10/19/2015 0500   CHOLHDL 6.6 10/19/2015 0500   VLDL 55 (H) 10/19/2015 0500   LDLCALC 124 (H) 10/19/2015 0500     Wt Readings from Last 3 Encounters:  04/11/16 269 lb 4.8 oz (122.2 kg)  11/11/15 272 lb (123.4 kg)  11/03/15 272 lb (123.4 kg)     Other studies Reviewed: Additional studies/ records that were reviewed today include: ***.  ASSESSMENT AND PLAN:  1.  ***   Current medicines are reviewed at length with the patient today.  The patient {ACTIONS; HAS/DOES NOT HAVE:19233} concerns regarding medicines.  The following changes have been made:  {PLAN; NO CHANGE:13088:s}  Labs/ tests ordered today include: *** No orders of the defined types were placed in this encounter.    Disposition:   FU with ***  Signed, Rosaria Ferries, PA-C  05/09/2016 7:44 AM    [facility] Phone: [phone]; Fax: [phone]  This note was written with the assistance of speech recognition software. Please excuse any transcriptional errors.

## 2016-08-29 ENCOUNTER — Other Ambulatory Visit: Payer: Self-pay | Admitting: Cardiovascular Disease

## 2016-08-29 NOTE — Telephone Encounter (Signed)
Review for refill. 

## 2016-08-29 NOTE — Telephone Encounter (Signed)
REFILL 

## 2016-09-27 ENCOUNTER — Other Ambulatory Visit: Payer: Self-pay | Admitting: Cardiovascular Disease

## 2016-10-03 ENCOUNTER — Other Ambulatory Visit: Payer: Self-pay | Admitting: Cardiovascular Disease

## 2016-10-04 NOTE — Telephone Encounter (Signed)
Please review for refill. Thanks!  

## 2016-10-07 ENCOUNTER — Other Ambulatory Visit: Payer: Self-pay | Admitting: Cardiovascular Disease

## 2016-10-09 NOTE — Telephone Encounter (Signed)
Refill Request.  

## 2016-10-29 ENCOUNTER — Other Ambulatory Visit: Payer: Self-pay | Admitting: Cardiology

## 2016-11-12 ENCOUNTER — Other Ambulatory Visit: Payer: Self-pay | Admitting: Cardiovascular Disease

## 2016-11-13 NOTE — Telephone Encounter (Signed)
Please review for refill. Thanks!  

## 2016-12-10 ENCOUNTER — Other Ambulatory Visit: Payer: Self-pay | Admitting: Cardiovascular Disease

## 2016-12-11 NOTE — Telephone Encounter (Signed)
Refill Request.  

## 2016-12-28 ENCOUNTER — Other Ambulatory Visit: Payer: Self-pay | Admitting: Cardiovascular Disease

## 2016-12-28 NOTE — Telephone Encounter (Signed)
Rx has been sent to the pharmacy electronically. ° °

## 2016-12-28 NOTE — Telephone Encounter (Signed)
Please review for refill, Thanks !  

## 2017-01-16 ENCOUNTER — Other Ambulatory Visit: Payer: Self-pay | Admitting: Cardiovascular Disease

## 2017-01-16 NOTE — Telephone Encounter (Signed)
Please review for refill. Thanks!  

## 2017-01-18 ENCOUNTER — Emergency Department (HOSPITAL_COMMUNITY)
Admission: EM | Admit: 2017-01-18 | Discharge: 2017-01-18 | Disposition: A | Discharge status: Home / Self Care | Payer: PRIVATE HEALTH INSURANCE | Attending: Emergency Medicine | Admitting: Emergency Medicine

## 2017-01-18 ENCOUNTER — Emergency Department (HOSPITAL_COMMUNITY): Payer: PRIVATE HEALTH INSURANCE

## 2017-01-18 ENCOUNTER — Encounter (HOSPITAL_COMMUNITY): Payer: Self-pay | Admitting: Emergency Medicine

## 2017-01-18 DIAGNOSIS — F17210 Nicotine dependence, cigarettes, uncomplicated: Secondary | ICD-10-CM | POA: Diagnosis not present

## 2017-01-18 DIAGNOSIS — N200 Calculus of kidney: Secondary | ICD-10-CM

## 2017-01-18 DIAGNOSIS — Z7901 Long term (current) use of anticoagulants: Secondary | ICD-10-CM | POA: Insufficient documentation

## 2017-01-18 DIAGNOSIS — Z79899 Other long term (current) drug therapy: Secondary | ICD-10-CM | POA: Diagnosis not present

## 2017-01-18 DIAGNOSIS — I10 Essential (primary) hypertension: Secondary | ICD-10-CM | POA: Insufficient documentation

## 2017-01-18 DIAGNOSIS — R1032 Left lower quadrant pain: Secondary | ICD-10-CM | POA: Diagnosis present

## 2017-01-18 LAB — URINALYSIS, ROUTINE W REFLEX MICROSCOPIC
BILIRUBIN URINE: NEGATIVE
Glucose, UA: NEGATIVE mg/dL
Ketones, ur: 5 mg/dL — AB
Leukocytes, UA: NEGATIVE
NITRITE: NEGATIVE
PH: 5 (ref 5.0–8.0)
Protein, ur: 30 mg/dL — AB
SPECIFIC GRAVITY, URINE: 1.018 (ref 1.005–1.030)
Squamous Epithelial / LPF: NONE SEEN

## 2017-01-18 LAB — CBC
HCT: 40.4 % (ref 39.0–52.0)
HEMOGLOBIN: 13.5 g/dL (ref 13.0–17.0)
MCH: 28.1 pg (ref 26.0–34.0)
MCHC: 33.4 g/dL (ref 30.0–36.0)
MCV: 84.2 fL (ref 78.0–100.0)
PLATELETS: 220 10*3/uL (ref 150–400)
RBC: 4.8 MIL/uL (ref 4.22–5.81)
RDW: 16.9 % — ABNORMAL HIGH (ref 11.5–15.5)
WBC: 8.1 10*3/uL (ref 4.0–10.5)

## 2017-01-18 LAB — COMPREHENSIVE METABOLIC PANEL
ALK PHOS: 55 U/L (ref 38–126)
ALT: 16 U/L — ABNORMAL LOW (ref 17–63)
AST: 17 U/L (ref 15–41)
Albumin: 4.1 g/dL (ref 3.5–5.0)
Anion gap: 11 (ref 5–15)
BILIRUBIN TOTAL: 1.5 mg/dL — AB (ref 0.3–1.2)
BUN: 17 mg/dL (ref 6–20)
CALCIUM: 8.6 mg/dL — AB (ref 8.9–10.3)
CO2: 23 mmol/L (ref 22–32)
Chloride: 106 mmol/L (ref 101–111)
Creatinine, Ser: 1.55 mg/dL — ABNORMAL HIGH (ref 0.61–1.24)
GFR calc non Af Amer: 54 mL/min — ABNORMAL LOW (ref >60)
Glucose, Bld: 127 mg/dL — ABNORMAL HIGH (ref 65–99)
Potassium: 4.1 mmol/L (ref 3.5–5.1)
Sodium: 140 mmol/L (ref 135–145)
TOTAL PROTEIN: 7.2 g/dL (ref 6.5–8.1)

## 2017-01-18 LAB — LIPASE, BLOOD: Lipase: 30 U/L (ref 11–51)

## 2017-01-18 MED ORDER — OXYCODONE-ACETAMINOPHEN 5-325 MG PO TABS: 2.0000 | ORAL_TABLET | ORAL | 0 refills | Status: DC | PRN

## 2017-01-18 MED ORDER — ONDANSETRON HCL 4 MG/2ML IJ SOLN
4.0000 mg | Freq: Once | INTRAMUSCULAR | Status: AC
  Administered 2017-01-18: 4 mg via INTRAVENOUS
  Filled 2017-01-18: qty 2

## 2017-01-18 MED ORDER — TAMSULOSIN HCL 0.4 MG PO CAPS: 0.4000 mg | ORAL_CAPSULE | Freq: Every day | ORAL | 0 refills | Status: DC

## 2017-01-18 MED ORDER — MORPHINE SULFATE (PF) 4 MG/ML IV SOLN
4.0000 mg | INTRAVENOUS | Status: DC | PRN
  Administered 2017-01-18 (×2): 4 mg via INTRAVENOUS
  Filled 2017-01-18 (×2): qty 1

## 2017-01-18 MED ORDER — SODIUM CHLORIDE 0.9 % IV SOLN
Freq: Once | INTRAVENOUS | Status: AC
  Administered 2017-01-18: 100 mL/h via INTRAVENOUS

## 2017-01-18 MED ORDER — ONDANSETRON 4 MG PO TBDP: 4.0000 mg | ORAL_TABLET | Freq: Three times a day (TID) | ORAL | 0 refills | Status: DC | PRN

## 2017-01-18 MED ORDER — OXYCODONE-ACETAMINOPHEN 5-325 MG PO TABS
1.0000 | ORAL_TABLET | Freq: Once | ORAL | Status: AC
  Administered 2017-01-18: 1 via ORAL
  Filled 2017-01-18: qty 1

## 2017-01-18 MED ORDER — KETOROLAC TROMETHAMINE 30 MG/ML IJ SOLN
30.0000 mg | Freq: Once | INTRAMUSCULAR | Status: AC
Start: 2017-01-18 — End: 2017-01-18
  Administered 2017-01-18: 30 mg via INTRAVENOUS
  Filled 2017-01-18: qty 1

## 2017-01-18 MED ORDER — TAMSULOSIN HCL 0.4 MG PO CAPS
0.4000 mg | ORAL_CAPSULE | Freq: Once | ORAL | Status: AC
  Administered 2017-01-18: 0.4 mg via ORAL
  Filled 2017-01-18: qty 1

## 2017-01-18 MED ORDER — HYDROMORPHONE HCL 1 MG/ML IJ SOLN
1.0000 mg | INTRAMUSCULAR | Status: DC | PRN
  Administered 2017-01-18: 1 mg via INTRAVENOUS
  Filled 2017-01-18: qty 1

## 2017-01-18 MED ORDER — SODIUM CHLORIDE 0.9 % IV BOLUS (SEPSIS)
500.0000 mL | Freq: Once | INTRAVENOUS | Status: AC
  Administered 2017-01-18: 500 mL via INTRAVENOUS

## 2017-01-18 NOTE — ED Triage Notes (Signed)
Patient reports left side abdominal pain with emesis (x2) onset this morning , denies fever or diarrhea .

## 2017-01-18 NOTE — ED Notes (Signed)
Pt returns from ct with c/o pain at 7-8/10.

## 2017-01-18 NOTE — ED Notes (Signed)
HR discussed with Dr. Jeneen Rinks.

## 2017-01-18 NOTE — ED Notes (Signed)
Patient transported to CT 

## 2017-01-18 NOTE — ED Provider Notes (Signed)
[facility] DEPT Provider Note   CSN: [id number] Arrival date & time: 01/18/17  0636     History   Chief Complaint Chief Complaint  Patient presents with  . Abdominal Pain    HPI Samuel Flynn is a 41 y.o. male. CC: left abdominal and flank pain.  HPI:  41 year old male with a previous history of right sided kidney stones. Sudden onset of left flank pain that awakened him from sleep at 3:00 this morning. Now rating to his left lower abdomen. No testicular pain. No dysuria or frequency. No hematuria. Nausea. No vomiting. Only significant past other medical history is peptic ulcer disease and atrial fibrillation.  Past Medical History:  Diagnosis Date  . Atrial fibrillation with RVR ([place])   . Cellulitis 04/2016   RT FOOT  . Gastric ulcer   . Gout   . Hypertension   . Paroxysmal A-fib ([place])   . Sleep apnea    USES CPAP  . Wears glasses     Patient Active Problem List   Diagnosis Date Noted  . Failure to attend appointment 04/25/2016  . Cellulitis of right foot 04/13/2016  . Atrial fibrillation with RVR ([place]) 04/10/2016  . Cellulitis of foot, right 04/10/2016  . Elevated troponin I level 04/10/2016  . Atrial fibrillation with rapid ventricular response ([place]) 10/18/2015  . Fracture of coronoid process of ulna, right, closed 09/21/2014  . Essential hypertension, benign 02/19/2014  . DUODENAL ULCER 09/18/2007  . RENAL CYST, LEFT 09/06/2007  . FLANK PAIN, RIGHT 08/26/2007  . PAIN IN THORACIC SPINE 02/18/2007  . BACK PAIN, LUMBAR 01/09/2007  . SYNCOPE 01/03/2007  . Gout 12/27/2006  . MORBID OBESITY 12/27/2006  . CARDIAC ARRHYTHMIA 12/27/2006  . SYMPTOM, DISTURBANCE OF SKIN SENSATION 12/27/2006    Past Surgical History:  Procedure Laterality Date  . ELBOW LIGAMENT RECONSTRUCTION Right 09/29/2014   Procedure: CORANOID FRACTURE FIXATION, POSSIBLE LIGAMENT REPAIR;  Surgeon: Nita Sells, MD;  Location: [facility];  Service: Orthopedics;   Laterality: Right;  Right elbow coranoid fracture fixation, possible ligament repair  . WISDOM TOOTH EXTRACTION         Home Medications    Prior to Admission medications   Medication Sig Start Date End Date Taking? Authorizing Provider  allopurinol (ZYLOPRIM) 100 MG tablet Take 100 mg by mouth daily. For gout    [provider]  colchicine 0.6 MG tablet Take 0.6 mg by mouth daily as needed (gout flare).  08/20/15   [provider]  doxycycline (VIBRAMYCIN) 100 MG capsule Take 1 capsule (100 mg total) by mouth 2 (two) times daily. 04/14/16   Florencia Reasons, MD  ibuprofen (ADVIL,MOTRIN) 200 MG tablet Take 800 mg by mouth every 6 (six) hours as needed for headache or moderate pain.    [provider]  lisinopril (PRINIVIL,ZESTRIL) 20 MG tablet Take 20 mg by mouth daily. For blood pressure 10/07/15   [provider]  metoprolol tartrate (LOPRESSOR) 25 MG tablet Take 0.5 tablets (12.5 mg total) by mouth 2 (two) times daily. 04/14/16   Florencia Reasons, MD  ondansetron (ZOFRAN ODT) 4 MG disintegrating tablet Take 1 tablet (4 mg total) by mouth every 8 (eight) hours as needed for nausea. 01/18/17   Tanna Furry, MD  oxyCODONE-acetaminophen (PERCOCET/ROXICET) 5-325 MG tablet Take 2 tablets by mouth every 4 (four) hours as needed. 01/18/17   Tanna Furry, MD  pantoprazole (PROTONIX) 40 MG tablet TAKE 1 TABLET BY MOUTH EVERY DAY 01/17/17   Skeet Latch, MD  predniSONE (DELTASONE) 10 MG tablet Take 1 tablet (10 mg total) by mouth daily with breakfast. Label  & dispense according to the schedule below. 6Pills PO on day one then, 5 Pills PO on day two, 4 Pills PO on day three, 3 Pills PO on day four, 2 Pills PO on day five, 1 Pills PO on day six,  then STOP. 04/14/16   Florencia Reasons, MD  tamsulosin (FLOMAX) 0.4 MG CAPS capsule Take 1 capsule (0.4 mg total) by mouth daily. 01/18/17   Tanna Furry, MD  XARELTO 20 MG TABS tablet TAKE 1 TABLET (20 MG TOTAL) BY MOUTH DAILY WITH SUPPER. 10/30/16    Skeet Latch, MD    Family History Family History  Problem Relation Age of Onset  . Hypertension Paternal Uncle   . Hypertension Maternal Grandmother   . Hypertension Paternal Uncle   . Hypertension Paternal Uncle   . Hypertension Paternal Uncle   . Hypertension Paternal Uncle     Social History Social History  Substance Use Topics  . Smoking status: Former Smoker    Types: Cigars    Quit date: 02/03/2016  . Smokeless tobacco: Never Used  . Alcohol use 0.0 oz/week     Comment: occ     Allergies   Patient has no known allergies.   Review of Systems Review of Systems  Constitutional: Negative for appetite change, chills, diaphoresis, fatigue and fever.  HENT: Negative for mouth sores, sore throat and trouble swallowing.   Eyes: Negative for visual disturbance.  Respiratory: Negative for cough, chest tightness, shortness of breath and wheezing.   Cardiovascular: Negative for chest pain.  Gastrointestinal: Positive for abdominal pain, nausea and vomiting. Negative for abdominal distention and diarrhea.  Endocrine: Negative for polydipsia, polyphagia and polyuria.  Genitourinary: Positive for flank pain and urgency. Negative for dysuria, frequency and hematuria.  Musculoskeletal: Negative for gait problem.  Skin: Negative for color change, pallor and rash.  Neurological: Negative for dizziness, syncope, light-headedness and headaches.  Hematological: Does not bruise/bleed easily.  Psychiatric/Behavioral: Negative for behavioral problems and confusion.     Physical Exam Updated Vital Signs BP (!) 107/59   Pulse (!) 51   Temp 97.5 F (36.4 C) (Oral)   Resp 17   Ht 6\' 2"  (1.88 m)   Wt 267 lb (121.1 kg)   SpO2 95%   BMI 34.28 kg/m   Physical Exam  Constitutional: He is oriented to person, place, and time. He appears well-developed and well-nourished. No distress.  No distress. He appears uncomfortable.  HENT:  Head: Normocephalic.  Eyes: Conjunctivae are  normal. Pupils are equal, round, and reactive to light. No scleral icterus.  Neck: Normal range of motion. Neck supple. No thyromegaly present.  Cardiovascular: Normal rate and regular rhythm.  Exam reveals no gallop and no friction rub.   No murmur heard. Pulmonary/Chest: Effort normal and breath sounds normal. No respiratory distress. He has no wheezes. He has no rales.  Abdominal: Soft. Bowel sounds are normal. He exhibits no distension. There is no tenderness. There is no rebound.  Soft. No reproducible tenderness.  Musculoskeletal: Normal range of motion.  Neurological: He is alert and oriented to person, place, and time.  Skin: Skin is warm and dry. No rash noted.  Psychiatric: He has a normal mood and affect. His behavior is normal.     ED Treatments / Results  Labs (all labs ordered are listed, but only abnormal results are displayed) Labs Reviewed  COMPREHENSIVE METABOLIC PANEL - Abnormal; Notable  for the following:       Result Value   Glucose, Bld 127 (*)    Creatinine, Ser 1.55 (*)    Calcium 8.6 (*)    ALT 16 (*)    Total Bilirubin 1.5 (*)    GFR calc non Af Amer 54 (*)    All other components within normal limits  CBC - Abnormal; Notable for the following:    RDW 16.9 (*)    All other components within normal limits  URINALYSIS, ROUTINE W REFLEX MICROSCOPIC - Abnormal; Notable for the following:    APPearance HAZY (*)    Hgb urine dipstick LARGE (*)    Ketones, ur 5 (*)    Protein, ur 30 (*)    Bacteria, UA FEW (*)    Non Squamous Epithelial 0-5 (*)    All other components within normal limits  LIPASE, BLOOD    EKG  EKG Interpretation None       Radiology Ct Abdomen Pelvis Wo Contrast  Result Date: 01/18/2017 CLINICAL DATA:  Left flank and abdominal pain EXAM: CT ABDOMEN AND PELVIS WITHOUT CONTRAST TECHNIQUE: Multidetector CT imaging of the abdomen and pelvis was performed following the standard protocol without oral or intravenous contrast material  administration. COMPARISON:  September 11, 2007 FINDINGS: Lower chest: There is mild bibasilar atelectasis. Lung bases otherwise are clear. Hepatobiliary: Liver measures 24.9 cm in length. No focal liver lesions are evident on this noncontrast enhanced study. Gallbladder wall is not appreciably thickened. There is no biliary duct dilatation. Pancreas: There is no pancreatic mass or inflammatory focus. Spleen: No splenic lesions are evident. Adrenals/Urinary Tract: Adrenals appear normal bilaterally. There is no renal mass on either side. Left kidney is edematous. There is no hydronephrosis on the right. There is mild hydronephrosis on the left. There is a 2 mm calculus, nonobstructing, in the lower pole of the left kidney. There is a 2 mm nonobstructing calculus in the upper pole the left kidney as well. There is a 2 mm calculus at the left ureterovesical junction. No other ureteral calculi are identified on either side. Urinary bladder is midline with wall thickness within normal limits. Stomach/Bowel: There are scattered sigmoid diverticula without diverticulitis. There is no appreciable bowel wall or mesenteric thickening. There is no evident bowel obstruction. No free air or portal venous air. Vascular/Lymphatic: There is no abdominal aortic aneurysm. There is minimal vascular calcification in the distal aorta. Major mesenteric vessels appear patent on this noncontrast enhanced study. There is no appreciable adenopathy in the abdomen or pelvis. Reproductive: Prostate and seminal vesicles are normal in size and contour. There is no pelvic mass or pelvic fluid collection. Other: Appendix appears normal. There is no ascites or abscess in the abdomen or pelvis. Musculoskeletal: There are no blastic or lytic bone lesions. There is no intramuscular or abdominal wall lesion. IMPRESSION: 2 mm calculus left ureterovesical junction with mild hydronephrosis on the left. Left kidney is in bones. There are scattered calculi on  the left kidney, nonobstructing. Prominent liver without focal lesion. Appendix appears normal.  No bowel obstruction.  No abscess. Scattered sigmoid diverticula without diverticulitis. Electronically Signed   By: Lowella Grip III M.D.   On: 01/18/2017 08:50    Procedures Procedures (including critical care time)  Medications Ordered in ED Medications  morphine 4 MG/ML injection 4 mg (4 mg Intravenous Given 01/18/17 0911)  HYDROmorphone (DILAUDID) injection 1 mg (1 mg Intravenous Given 01/18/17 1209)  sodium chloride 0.9 % bolus 500 mL (0  mLs Intravenous Stopped 01/18/17 0823)  0.9 %  sodium chloride infusion (100 mL/hr Intravenous New Bag/Given 01/18/17 0823)  ondansetron (ZOFRAN) injection 4 mg (4 mg Intravenous Given 01/18/17 0724)  ondansetron (ZOFRAN) injection 4 mg (4 mg Intravenous Given 01/18/17 1037)  ketorolac (TORADOL) 30 MG/ML injection 30 mg (30 mg Intravenous Given 01/18/17 1040)  tamsulosin (FLOMAX) capsule 0.4 mg (0.4 mg Oral Given 01/18/17 1045)  oxyCODONE-acetaminophen (PERCOCET/ROXICET) 5-325 MG per tablet 1 tablet (1 tablet Oral Given 01/18/17 1045)     Initial Impression / Assessment and Plan / ED Course  I have reviewed the triage vital signs and the nursing notes.  Pertinent labs & imaging results that were available during my care of the patient were reviewed by me and considered in my medical decision making (see chart for details).    Primary in the differential diagnosis is probable ureteral colic. CT scan does confirm small distal 2 mm ureteral stone. He has hematuria no signs of infection. Creatinine 1.5. Given fluids and pain medications. Ultimate symptoms are controlled here. Appropriate for discharge home. Pressures are for Percocet, Flomax, Zofran. Push fluids. Urology if not improving.  Final Clinical Impressions(s) / ED Diagnoses   Final diagnoses:  Kidney stone    New Prescriptions New Prescriptions   ONDANSETRON (ZOFRAN ODT) 4 MG DISINTEGRATING  TABLET    Take 1 tablet (4 mg total) by mouth every 8 (eight) hours as needed for nausea.   OXYCODONE-ACETAMINOPHEN (PERCOCET/ROXICET) 5-325 MG TABLET    Take 2 tablets by mouth every 4 (four) hours as needed.   TAMSULOSIN (FLOMAX) 0.4 MG CAPS CAPSULE    Take 1 capsule (0.4 mg total) by mouth daily.     Tanna Furry, MD 01/18/17 1314

## 2017-01-18 NOTE — Discharge Instructions (Signed)
Push fluids. Percocet as needed for pain. Zofran as needed for nausea. Flomax daily until stone has passed. Call Dr. Alyson Ingles at [facility] urology on Monday if still painful.

## 2017-01-18 NOTE — ED Notes (Signed)
Pt states he understands instructions. Home stable after called wife for ride.

## 2017-02-19 ENCOUNTER — Other Ambulatory Visit: Payer: Self-pay | Admitting: Cardiovascular Disease

## 2017-02-19 NOTE — Telephone Encounter (Signed)
Please review for refill, Thanks !  

## 2017-03-23 ENCOUNTER — Other Ambulatory Visit: Payer: Self-pay | Admitting: Cardiovascular Disease

## 2017-03-23 NOTE — Telephone Encounter (Signed)
This is Dr. [place]'s pt.  °

## 2017-03-23 NOTE — Telephone Encounter (Signed)
Refill Request.  

## 2017-04-21 ENCOUNTER — Other Ambulatory Visit: Payer: Self-pay | Admitting: Cardiovascular Disease

## 2017-04-23 NOTE — Telephone Encounter (Signed)
Please review for refill, thanks ! 

## 2017-05-01 ENCOUNTER — Other Ambulatory Visit: Payer: Self-pay | Admitting: Pharmacist Clinician (PhC)/ Clinical Pharmacy Specialist

## 2017-05-01 MED ORDER — RIVAROXABAN 20 MG PO TABS: ORAL_TABLET | ORAL | 0 refills | Status: DC

## 2017-05-09 ENCOUNTER — Other Ambulatory Visit: Payer: Self-pay | Admitting: Cardiovascular Disease

## 2017-05-10 NOTE — Telephone Encounter (Signed)
Please review for refill, thanks ! 

## 2017-05-31 ENCOUNTER — Other Ambulatory Visit: Payer: Self-pay | Admitting: Cardiovascular Disease

## 2017-05-31 NOTE — Telephone Encounter (Signed)
Please review for refill, Thanks !  

## 2017-07-12 ENCOUNTER — Other Ambulatory Visit: Payer: Self-pay | Admitting: Cardiovascular Disease

## 2017-07-12 NOTE — Telephone Encounter (Signed)
Please review for refill, Thanks !  

## 2017-07-21 ENCOUNTER — Other Ambulatory Visit: Payer: Self-pay | Admitting: Cardiovascular Disease

## 2017-07-23 NOTE — Telephone Encounter (Signed)
Please review for refill, Thanks !  

## 2017-08-08 ENCOUNTER — Other Ambulatory Visit: Payer: Self-pay | Admitting: Cardiovascular Disease

## 2017-08-08 NOTE — Telephone Encounter (Signed)
Please review for refill, thanks ! 

## 2017-08-13 ENCOUNTER — Other Ambulatory Visit: Payer: Self-pay | Admitting: Cardiovascular Disease

## 2017-08-15 ENCOUNTER — Other Ambulatory Visit: Payer: Self-pay

## 2017-08-15 MED ORDER — RIVAROXABAN 20 MG PO TABS: 20.0000 mg | ORAL_TABLET | Freq: Every day | ORAL | 0 refills | Status: DC

## 2017-08-19 ENCOUNTER — Other Ambulatory Visit: Payer: Self-pay | Admitting: Cardiovascular Disease

## 2017-08-20 NOTE — Telephone Encounter (Signed)
[facility] pt ?

## 2017-08-20 NOTE — Telephone Encounter (Signed)
Refill Request.  

## 2017-08-20 NOTE — Telephone Encounter (Signed)
Patient last seen in clinic on 11/2015. No show to appt after. Npo appropriate to refill until f/u visit.  Rx for 10 tables sent until f/u appt

## 2017-09-17 NOTE — Progress Notes (Deleted)
Cardiology Office Note   Date:  09/17/2017   ID:  Samuel Flynn, DOB Feb 24, 1976, MRN [id number]  PCP:  Marcial Pacas, DO  Cardiologist:   Skeet Latch, MD   No chief complaint on file.    History of Present Illness: Samuel Flynn is a 42 y.o. male with hypertension, obesity, untreated sleep apnea and paroxysmal atrial fibrillation who presents for follow.   Samuel Flynn was seen in the ED 10/18/15 with new-onset atrial fibrillation with RVR.  He was started on a diltiazem infusion and converted spontaneously back to sinus rhythm.  He was started on Xarelto for anticoagulation.  TSH and electrolytes were unremarkable.  Echo revealed LVEF 17-51% without diastolic dysfunction. Troponin was mildly elevated at 0.06.  His home amlodipine was discontinued and metoprolol was started while lisionpril was continued.  Since being discharged Samuel Flynn has felt fatigued.  He also notes chest pain in his mid to right chest that occurs twice daily.  It lasts for 5-6 minutes daily.  It occurs at rest and not with exertion, though he has not been exercising lately.  He does not get regular exercise.  He notes that his heart skips approximately once daily but it does not last for long.  There is no associated lightheadedness or dizziness and it is not a severe as when he went to the hospital.  He denies lower extremity edema or orthopnea.  He does endorse waking up gasping for air.  He was diagnosed with sleep apnea but is unable to afford the machine as there is a $800 copay.  At his last appointment Mr. Current was switched from metoprolol to diltiazem due to fatigue.     Past Medical History:  Diagnosis Date  . Atrial fibrillation with RVR ([place])   . Cellulitis 04/2016   RT FOOT  . Gastric ulcer   . Gout   . Hypertension   . Paroxysmal A-fib ([place])   . Sleep apnea    USES CPAP  . Wears glasses     Past Surgical History:  Procedure Laterality Date  . ELBOW LIGAMENT  RECONSTRUCTION Right 09/29/2014   Procedure: CORANOID FRACTURE FIXATION, POSSIBLE LIGAMENT REPAIR;  Surgeon: Nita Sells, MD;  Location: [facility];  Service: Orthopedics;  Laterality: Right;  Right elbow coranoid fracture fixation, possible ligament repair  . WISDOM TOOTH EXTRACTION       Current Outpatient Medications  Medication Sig Dispense Refill  . allopurinol (ZYLOPRIM) 100 MG tablet Take 100 mg by mouth daily. For gout    . colchicine 0.6 MG tablet Take 0.6 mg by mouth daily as needed (gout flare).   1  . doxycycline (VIBRAMYCIN) 100 MG capsule Take 1 capsule (100 mg total) by mouth 2 (two) times daily. 10 capsule 0  . ibuprofen (ADVIL,MOTRIN) 200 MG tablet Take 800 mg by mouth every 6 (six) hours as needed for headache or moderate pain.    Marland Kitchen lisinopril (PRINIVIL,ZESTRIL) 20 MG tablet Take 20 mg by mouth daily. For blood pressure    . metoprolol tartrate (LOPRESSOR) 25 MG tablet Take 0.5 tablets (12.5 mg total) by mouth 2 (two) times daily. 60 tablet 0  . ondansetron (ZOFRAN ODT) 4 MG disintegrating tablet Take 1 tablet (4 mg total) by mouth every 8 (eight) hours as needed for nausea. 6 tablet 0  . oxyCODONE-acetaminophen (PERCOCET/ROXICET) 5-325 MG tablet Take 2 tablets by mouth every 4 (four) hours as needed. 24 tablet 0  . pantoprazole (PROTONIX) 40 MG  tablet TAKE 1 TABLET BY MOUTH EVERY DAY 30 tablet 6  . predniSONE (DELTASONE) 10 MG tablet Take 1 tablet (10 mg total) by mouth daily with breakfast. Label  & dispense according to the schedule below. 6Pills PO on day one then, 5 Pills PO on day two, 4 Pills PO on day three, 3 Pills PO on day four, 2 Pills PO on day five, 1 Pills PO on day six,  then STOP. 21 tablet 0  . rivaroxaban (XARELTO) 20 MG TABS tablet Take 1 tablet (20 mg total) by mouth daily with supper. 7 tablet 0  . tamsulosin (FLOMAX) 0.4 MG CAPS capsule Take 1 capsule (0.4 mg total) by mouth daily. 7 capsule 0  . XARELTO 20 MG TABS tablet TAKE 1  TABLET BY MOUTH DAILY WITH SUPPER. NEED MD APPT FOR REFILLS 10 tablet 0   No current facility-administered medications for this visit.     Allergies:   Patient has no known allergies.    Social History:  The patient  reports that he quit smoking about 19 months ago. His smoking use included cigars. he has never used smokeless tobacco. He reports that he drinks alcohol. He reports that he does not use drugs.   Family History:  The patient's family history includes Hypertension in his maternal grandmother, paternal uncle, paternal uncle, paternal uncle, paternal uncle, and paternal uncle.    ROS:  Please see the history of present illness.   Otherwise, review of systems are positive for none.   All other systems are reviewed and negative.    PHYSICAL EXAM: VS:  There were no vitals taken for this visit. , BMI There is no height or weight on file to calculate BMI. GENERAL:  Well appearing HEENT:  Pupils equal round and reactive, fundi not visualized, oral mucosa unremarkable NECK:  No jugular venous distention, waveform within normal limits, carotid upstroke brisk and symmetric, no bruits, no thyromegaly LYMPHATICS:  No cervical adenopathy LUNGS:  Clear to auscultation bilaterally HEART:  RRR.  PMI not displaced or sustained,S1 and S2 within normal limits, no S3, no S4, no clicks, no rubs, no murmurs ABD:  Flat, positive bowel sounds normal in frequency in pitch, no bruits, no rebound, no guarding, no midline pulsatile mass, no hepatomegaly, no splenomegaly EXT:  2 plus pulses throughout, no edema, no cyanosis no clubbing SKIN:  No rashes no nodules NEURO:  Cranial nerves II through XII grossly intact, motor grossly intact throughout PSYCH:  Cognitively intact, oriented to person place and time    EKG:  EKG is ordered today. The ekg ordered today demonstrates sinus bradycardia rate 56 bpm.     Recent Labs: 01/18/2017: ALT 16; BUN 17; Creatinine, Ser 1.55; Hemoglobin 13.5; Platelets  220; Potassium 4.1; Sodium 140    Lipid Panel    Component Value Date/Time   CHOL 211 (H) 10/19/2015 0500   TRIG 273 (H) 10/19/2015 0500   HDL 32 (L) 10/19/2015 0500   CHOLHDL 6.6 10/19/2015 0500   VLDL 55 (H) 10/19/2015 0500   LDLCALC 124 (H) 10/19/2015 0500      Wt Readings from Last 3 Encounters:  01/18/17 267 lb (121.1 kg)  04/11/16 269 lb 4.8 oz (122.2 kg)  11/11/15 272 lb (123.4 kg)      ASSESSMENT AND PLAN:  # Atrial fibrillation: Mr. Rolf is currently in sinus rhythm.  I suspect that his fatigue may be related to the metoprolol.  We will try switching to diltiazem 120 mg daily. He has  obesity and untreated sleep apnea which may be the trigger, as his heart was otherwise unremarkable on echo.  This patien'ts CHA2DS2-VASc Score and unadjusted Ischemic Stroke Rate (% per year) is equal to 0.6 % stroke rate/year from a score of 1  Above score calculated as 1 point each if present [CHF, HTN, DM, Vascular=MI/PAD/Aortic Plaque, Age if 65-74, or Male] Above score calculated as 2 points each if present [Age > 75, or Stroke/TIA/TE]   # Hypertension: BP well-controlled on metoprolol and lisinopril.  We will switch metoprolol to diltiazem as above.  #  Chest pain: Mr. Star symptoms are concerning, especially given his elevated troponin in the hospital.  This was likely due to demand ischemia in the setting of atrial fibrillation with RVR.  We will obtain an exercise Cardiolite to evaluate for ischemia.  He will need to hold his nodal agent prior to exercise.  # Obesity: Mr. Behar was encouraged to increase his exercise to at least 30 minutes most days of the week.  First, we will obtain a stress test as above.  # OSA: Pt encouraged to get CPAP when financially feasible.    Current medicines are reviewed at length with the patient today.  The patient does not have concerns regarding medicines.  The following changes have been made:  Stop metoprolol.  Start diltiazem.    Labs/ tests ordered today include:   No orders of the defined types were placed in this encounter.    Disposition:   FU with Basia Mcginty C. Oval Linsey, MD, [facility] in 1 month.    This note was written with the assistance of speech recognition software.  Please excuse any transcriptional errors.  Signed, Loribeth Katich C. Oval Linsey, MD, [facility]  09/17/2017 3:20 PM    [facility]

## 2017-09-18 ENCOUNTER — Ambulatory Visit: Payer: PRIVATE HEALTH INSURANCE | Admitting: Cardiovascular Disease

## 2017-12-08 ENCOUNTER — Other Ambulatory Visit: Payer: Self-pay | Admitting: Internal Medicine

## 2017-12-27 ENCOUNTER — Other Ambulatory Visit: Payer: Self-pay | Admitting: Internal Medicine

## 2017-12-27 NOTE — Telephone Encounter (Signed)
REFILL 

## 2018-01-07 ENCOUNTER — Other Ambulatory Visit: Payer: Self-pay | Admitting: Internal Medicine

## 2018-01-07 NOTE — Telephone Encounter (Signed)
REFILL 

## 2018-01-15 ENCOUNTER — Encounter (HOSPITAL_COMMUNITY): Payer: Self-pay | Admitting: Emergency Medicine

## 2018-01-15 ENCOUNTER — Inpatient Hospital Stay (HOSPITAL_COMMUNITY)
Admission: EM | Admit: 2018-01-15 | Discharge: 2018-01-19 | DRG: 310 | Disposition: A | Discharge status: Home / Self Care | Payer: PRIVATE HEALTH INSURANCE | Attending: Internal Medicine | Admitting: Internal Medicine

## 2018-01-15 ENCOUNTER — Emergency Department (HOSPITAL_COMMUNITY): Payer: PRIVATE HEALTH INSURANCE

## 2018-01-15 DIAGNOSIS — I129 Hypertensive chronic kidney disease with stage 1 through stage 4 chronic kidney disease, or unspecified chronic kidney disease: Secondary | ICD-10-CM | POA: Diagnosis present

## 2018-01-15 DIAGNOSIS — Z9989 Dependence on other enabling machines and devices: Secondary | ICD-10-CM | POA: Diagnosis not present

## 2018-01-15 DIAGNOSIS — Z7901 Long term (current) use of anticoagulants: Secondary | ICD-10-CM

## 2018-01-15 DIAGNOSIS — N183 Chronic kidney disease, stage 3 (moderate): Secondary | ICD-10-CM | POA: Diagnosis present

## 2018-01-15 DIAGNOSIS — Z9114 Patient's other noncompliance with medication regimen: Secondary | ICD-10-CM

## 2018-01-15 DIAGNOSIS — F17290 Nicotine dependence, other tobacco product, uncomplicated: Secondary | ICD-10-CM | POA: Diagnosis present

## 2018-01-15 DIAGNOSIS — G4733 Obstructive sleep apnea (adult) (pediatric): Secondary | ICD-10-CM | POA: Diagnosis present

## 2018-01-15 DIAGNOSIS — Z6839 Body mass index (BMI) 39.0-39.9, adult: Secondary | ICD-10-CM | POA: Diagnosis not present

## 2018-01-15 DIAGNOSIS — I480 Paroxysmal atrial fibrillation: Secondary | ICD-10-CM | POA: Diagnosis present

## 2018-01-15 DIAGNOSIS — K279 Peptic ulcer, site unspecified, unspecified as acute or chronic, without hemorrhage or perforation: Secondary | ICD-10-CM | POA: Diagnosis present

## 2018-01-15 DIAGNOSIS — R0789 Other chest pain: Secondary | ICD-10-CM | POA: Diagnosis not present

## 2018-01-15 DIAGNOSIS — M79675 Pain in left toe(s): Secondary | ICD-10-CM | POA: Diagnosis present

## 2018-01-15 DIAGNOSIS — M109 Gout, unspecified: Secondary | ICD-10-CM | POA: Diagnosis present

## 2018-01-15 DIAGNOSIS — I10 Essential (primary) hypertension: Secondary | ICD-10-CM | POA: Diagnosis not present

## 2018-01-15 DIAGNOSIS — I4891 Unspecified atrial fibrillation: Secondary | ICD-10-CM | POA: Diagnosis present

## 2018-01-15 DIAGNOSIS — Z8249 Family history of ischemic heart disease and other diseases of the circulatory system: Secondary | ICD-10-CM | POA: Diagnosis not present

## 2018-01-15 DIAGNOSIS — Z79899 Other long term (current) drug therapy: Secondary | ICD-10-CM

## 2018-01-15 DIAGNOSIS — R609 Edema, unspecified: Secondary | ICD-10-CM | POA: Diagnosis not present

## 2018-01-15 DIAGNOSIS — E785 Hyperlipidemia, unspecified: Secondary | ICD-10-CM | POA: Diagnosis present

## 2018-01-15 DIAGNOSIS — Z720 Tobacco use: Secondary | ICD-10-CM | POA: Diagnosis not present

## 2018-01-15 DIAGNOSIS — E669 Obesity, unspecified: Secondary | ICD-10-CM | POA: Diagnosis not present

## 2018-01-15 LAB — URINALYSIS, ROUTINE W REFLEX MICROSCOPIC
BILIRUBIN URINE: NEGATIVE
GLUCOSE, UA: NEGATIVE mg/dL
HGB URINE DIPSTICK: NEGATIVE
Ketones, ur: 5 mg/dL — AB
Leukocytes, UA: NEGATIVE
Nitrite: NEGATIVE
PH: 6 (ref 5.0–8.0)
Protein, ur: NEGATIVE mg/dL
SPECIFIC GRAVITY, URINE: 1.014 (ref 1.005–1.030)

## 2018-01-15 LAB — D-DIMER, QUANTITATIVE (NOT AT ARMC)

## 2018-01-15 LAB — CBC WITH DIFFERENTIAL/PLATELET
BASOS ABS: 0 10*3/uL (ref 0.0–0.1)
Basophils Relative: 1 %
Eosinophils Absolute: 0.1 10*3/uL (ref 0.0–0.7)
Eosinophils Relative: 2 %
HEMATOCRIT: 42.1 % (ref 39.0–52.0)
Hemoglobin: 14.6 g/dL (ref 13.0–17.0)
LYMPHS PCT: 23 %
Lymphs Abs: 1.5 10*3/uL (ref 0.7–4.0)
MCH: 28.4 pg (ref 26.0–34.0)
MCHC: 34.7 g/dL (ref 30.0–36.0)
MCV: 81.9 fL (ref 78.0–100.0)
MONO ABS: 0.5 10*3/uL (ref 0.1–1.0)
MONOS PCT: 7 %
NEUTROS ABS: 4.4 10*3/uL (ref 1.7–7.7)
Neutrophils Relative %: 67 %
Platelets: 251 10*3/uL (ref 150–400)
RBC: 5.14 MIL/uL (ref 4.22–5.81)
RDW: 16.7 % — AB (ref 11.5–15.5)
WBC: 6.5 10*3/uL (ref 4.0–10.5)

## 2018-01-15 LAB — BASIC METABOLIC PANEL
ANION GAP: 12 (ref 5–15)
BUN: 23 mg/dL — ABNORMAL HIGH (ref 6–20)
CALCIUM: 8.8 mg/dL — AB (ref 8.9–10.3)
CO2: 24 mmol/L (ref 22–32)
Chloride: 105 mmol/L (ref 101–111)
Creatinine, Ser: 1.4 mg/dL — ABNORMAL HIGH (ref 0.61–1.24)
GFR calc Af Amer: >60 mL/min (ref >60)
GFR calc non Af Amer: >60 mL/min (ref >60)
GLUCOSE: 105 mg/dL — AB (ref 65–99)
Potassium: 4 mmol/L (ref 3.5–5.1)
Sodium: 141 mmol/L (ref 135–145)

## 2018-01-15 LAB — MAGNESIUM: Magnesium: 2.2 mg/dL (ref 1.7–2.4)

## 2018-01-15 LAB — TROPONIN I: Troponin I: <0.03 ng/mL (ref <0.03)

## 2018-01-15 LAB — MRSA PCR SCREENING: MRSA by PCR: NEGATIVE

## 2018-01-15 LAB — BRAIN NATRIURETIC PEPTIDE: B NATRIURETIC PEPTIDE 5: 101.7 pg/mL — AB (ref 0.0–100.0)

## 2018-01-15 MED ORDER — DILTIAZEM HCL-DEXTROSE 100-5 MG/100ML-% IV SOLN (PREMIX): 5.0000 mg/h | INTRAVENOUS | Status: DC

## 2018-01-15 MED ORDER — DILTIAZEM HCL 25 MG/5ML IV SOLN
20.0000 mg | Freq: Once | INTRAVENOUS | Status: AC
  Administered 2018-01-15: 20 mg via INTRAVENOUS
  Filled 2018-01-15: qty 5

## 2018-01-15 MED ORDER — DILTIAZEM HCL 25 MG/5ML IV SOLN: 10.0000 mg | Freq: Once | INTRAVENOUS | Status: DC

## 2018-01-15 MED ORDER — DILTIAZEM LOAD VIA INFUSION: 10.0000 mg | Freq: Once | INTRAVENOUS | Status: DC

## 2018-01-15 MED ORDER — RIVAROXABAN 20 MG PO TABS
20.0000 mg | ORAL_TABLET | Freq: Once | ORAL | Status: AC
Start: 2018-01-15 — End: 2018-01-15
  Administered 2018-01-15: 20 mg via ORAL
  Filled 2018-01-15: qty 1

## 2018-01-15 MED ORDER — DILTIAZEM LOAD VIA INFUSION
10.0000 mg | Freq: Once | INTRAVENOUS | Status: AC
  Administered 2018-01-15: 10 mg via INTRAVENOUS
  Filled 2018-01-15: qty 10

## 2018-01-15 MED ORDER — DILTIAZEM HCL-DEXTROSE 100-5 MG/100ML-% IV SOLN (PREMIX)
5.0000 mg/h | INTRAVENOUS | Status: DC
  Administered 2018-01-15 – 2018-01-18 (×3): 5 mg/h via INTRAVENOUS
  Filled 2018-01-15 (×5): qty 100

## 2018-01-15 NOTE — ED Provider Notes (Signed)
Samuel Flynn Provider Note   CSN: [id number] Arrival date & time: 01/15/18  1446     History   Chief Complaint Chief Complaint  Patient presents with  . Palpitations    HPI NUR KRASINSKI is a 42 y.o. male.  HPI 42 year old male with past medical history as below including A. fib RVR supposed to be on anticoagulation here with symptomatic palpitations.  Patient states that the symptoms started several days ago.  He noticed acute onset of palpitations with shortness of breath.  Around the last several days, his palpitations which were initially intermittent have become constant.  Has had associated intermittent left-sided sharp chest pain.  He said shortness of breath and occasional nausea with it.  He states he has a history of A. fib with similar symptoms.  He does note that he ran out of his medication several months ago, and has not taken any anticoagulant since then.  He does state, however, that he has been intermittently taking metoprolol.  He also notes that he has had some pain in his right leg, which she believes is due to sitting for prolonged amount of time.  No history of blood clots.  Denies any hemoptysis or cough. Past Medical History:  Diagnosis Date  . Atrial fibrillation with RVR ([place])   . Cellulitis 04/2016   RT FOOT  . Gastric ulcer   . Gout   . Hypertension   . Paroxysmal A-fib ([place])   . Sleep apnea    USES CPAP  . Wears glasses     Patient Active Problem List   Diagnosis Date Noted  . Failure to attend appointment 04/25/2016  . Cellulitis of right foot 04/13/2016  . Atrial fibrillation with RVR ([place]) 04/10/2016  . Cellulitis of foot, right 04/10/2016  . Elevated troponin I level 04/10/2016  . Atrial fibrillation with rapid ventricular response ([place]) 10/18/2015  . Fracture of coronoid process of ulna, right, closed 09/21/2014  . Essential hypertension, benign 02/19/2014  . DUODENAL ULCER 09/18/2007  . RENAL CYST,  LEFT 09/06/2007  . FLANK PAIN, RIGHT 08/26/2007  . PAIN IN THORACIC SPINE 02/18/2007  . BACK PAIN, LUMBAR 01/09/2007  . SYNCOPE 01/03/2007  . Gout 12/27/2006  . MORBID OBESITY 12/27/2006  . CARDIAC ARRHYTHMIA 12/27/2006  . SYMPTOM, DISTURBANCE OF SKIN SENSATION 12/27/2006    Past Surgical History:  Procedure Laterality Date  . ELBOW LIGAMENT RECONSTRUCTION Right 09/29/2014   Procedure: CORANOID FRACTURE FIXATION, POSSIBLE LIGAMENT REPAIR;  Surgeon: Nita Sells, MD;  Location: [facility];  Service: Orthopedics;  Laterality: Right;  Right elbow coranoid fracture fixation, possible ligament repair  . WISDOM TOOTH EXTRACTION          Home Medications    Prior to Admission medications   Medication Sig Start Date End Date Taking? Authorizing Provider  allopurinol (ZYLOPRIM) 100 MG tablet Take 100 mg by mouth daily. For gout   Yes [provider]  amLODipine (NORVASC) 10 MG tablet Take 10 mg by mouth daily. 01/05/18  Yes [provider]  colchicine 0.6 MG tablet Take 0.6 mg by mouth daily as needed (gout flare).  08/20/15  Yes [provider]  ibuprofen (ADVIL,MOTRIN) 200 MG tablet Take 800 mg by mouth every 6 (six) hours as needed for headache or moderate pain.   Yes [provider]  lisinopril (PRINIVIL,ZESTRIL) 20 MG tablet Take 20 mg by mouth daily. For blood pressure 10/07/15  Yes [provider]  metoprolol tartrate (LOPRESSOR) 25  MG tablet Take 25 mg by mouth daily. 01/04/18  Yes [provider]  Omega-3 Fatty Acids (FISH OIL PO) Take 1 tablet by mouth daily.   Yes [provider]  pantoprazole (PROTONIX) 40 MG tablet TAKE 1 TABLET (40 MG TOTAL) BY MOUTH DAILY. NEED OV. 01/07/18  Yes Hilty, Nadean Corwin, MD  rivaroxaban (XARELTO) 20 MG TABS tablet Take 1 tablet (20 mg total) by mouth daily with supper. 08/15/17  Yes Skeet Latch, MD  doxycycline (VIBRAMYCIN) 100 MG capsule Take 1 capsule (100 mg  total) by mouth 2 (two) times daily. Patient not taking: Reported on 01/15/2018 04/14/16   Florencia Reasons, MD  metoprolol tartrate (LOPRESSOR) 25 MG tablet Take 0.5 tablets (12.5 mg total) by mouth 2 (two) times daily. Patient not taking: Reported on 01/15/2018 04/14/16   Florencia Reasons, MD  ondansetron (ZOFRAN ODT) 4 MG disintegrating tablet Take 1 tablet (4 mg total) by mouth every 8 (eight) hours as needed for nausea. Patient not taking: Reported on 01/15/2018 01/18/17   Tanna Furry, MD  oxyCODONE-acetaminophen (PERCOCET/ROXICET) 5-325 MG tablet Take 2 tablets by mouth every 4 (four) hours as needed. Patient not taking: Reported on 01/15/2018 01/18/17   Tanna Furry, MD  predniSONE (DELTASONE) 10 MG tablet Take 1 tablet (10 mg total) by mouth daily with breakfast. Label  & dispense according to the schedule below. 6Pills PO on day one then, 5 Pills PO on day two, 4 Pills PO on day three, 3 Pills PO on day four, 2 Pills PO on day five, 1 Pills PO on day six,  then STOP. Patient not taking: Reported on 01/15/2018 04/14/16   Florencia Reasons, MD  tamsulosin (FLOMAX) 0.4 MG CAPS capsule Take 1 capsule (0.4 mg total) by mouth daily. Patient not taking: Reported on 01/15/2018 01/18/17   Tanna Furry, MD  XARELTO 20 MG TABS tablet TAKE 1 TABLET BY MOUTH DAILY WITH SUPPER. NEED MD APPT FOR REFILLS Patient not taking: Reported on 01/15/2018 08/20/17   Skeet Latch, MD    Family History Family History  Problem Relation Age of Onset  . Hypertension Paternal Uncle   . Hypertension Maternal Grandmother   . Hypertension Paternal Uncle   . Hypertension Paternal Uncle   . Hypertension Paternal Uncle   . Hypertension Paternal Uncle     Social History Social History   Tobacco Use  . Smoking status: Former Smoker    Types: Cigars    Last attempt to quit: 02/03/2016    Years since quitting: 1.9  . Smokeless tobacco: Never Used  Substance Use Topics  . Alcohol use: Yes    Alcohol/week: 0.0 oz    Comment: occ  . Drug use: No       Allergies   Patient has no known allergies.   Review of Systems Review of Systems  Constitutional: Positive for fatigue. Negative for chills and fever.  HENT: Negative for congestion and rhinorrhea.   Eyes: Negative for visual disturbance.  Respiratory: Positive for cough and shortness of breath. Negative for wheezing.   Cardiovascular: Positive for palpitations. Negative for chest pain and leg swelling.  Gastrointestinal: Negative for abdominal pain, diarrhea, nausea and vomiting.  Genitourinary: Negative for dysuria and flank pain.  Musculoskeletal: Negative for neck pain and neck stiffness.  Skin: Negative for rash and wound.  Allergic/Immunologic: Negative for immunocompromised state.  Neurological: Positive for weakness. Negative for syncope and headaches.  All other systems reviewed and are negative.    Physical Exam Updated Vital Signs BP 124/76  Pulse 98   Temp 97.8 F (36.6 C) (Oral)   Resp 16   Ht 6\' 2"  (1.88 m)   Wt (!) 140.6 kg (310 lb)   SpO2 96%   BMI 39.80 kg/m   Physical Exam  Constitutional: He is oriented to person, place, and time. He appears well-developed and well-nourished. No distress.  HENT:  Head: Normocephalic and atraumatic.  Eyes: Conjunctivae are normal.  Neck: Neck supple.  Cardiovascular: Normal heart sounds. Exam reveals no friction rub.  No murmur heard. Irregularly irregular, tachycardic  Pulmonary/Chest: Effort normal and breath sounds normal. No respiratory distress. He has no wheezes. He has no rales.  Abdominal: He exhibits no distension.  Musculoskeletal: He exhibits edema (Trace bilateral).  Neurological: He is alert and oriented to person, place, and time. He exhibits normal muscle tone.  Skin: Skin is warm. Capillary refill takes less than 2 seconds.  Psychiatric: He has a normal mood and affect.  Nursing note and vitals reviewed.    ED Treatments / Results  Labs (all labs ordered are listed, but only abnormal  results are displayed) Labs Reviewed  CBC WITH DIFFERENTIAL/PLATELET - Abnormal; Notable for the following components:      Result Value   RDW 16.7 (*)    All other components within normal limits  BASIC METABOLIC PANEL - Abnormal; Notable for the following components:   Glucose, Bld 105 (*)    BUN 23 (*)    Creatinine, Ser 1.40 (*)    Calcium 8.8 (*)    All other components within normal limits  MAGNESIUM  TROPONIN I  D-DIMER, QUANTITATIVE (NOT AT [facility])  BRAIN NATRIURETIC PEPTIDE    EKG EKG Interpretation  Date/Time:  Tuesday Jan 15 2018 14:55:27 EDT Ventricular Rate:  143 PR Interval:    QRS Duration: 93 QT Interval:  303 QTC Calculation: 468 R Axis:   46 Text Interpretation:  Atrial fibrillation Borderline repolarization abnormality Baseline wander in lead(s) V4 V5 V6 Since last EKG, afib has replaced sinus rhythm There are diffuse St-t changes, likely rate related Confirmed by Duffy Bruce [phone]) on 01/15/2018 3:09:39 PM   Radiology Dg Chest 2 View  Result Date: 01/15/2018 CLINICAL DATA:  Atrial fibrillation EXAM: CHEST - 2 VIEW COMPARISON:  April 10, 2016 FINDINGS: There is no edema or consolidation. The heart size and pulmonary vascularity are normal. No adenopathy. No bone lesions. IMPRESSION: No edema or consolidation. Electronically Signed   By: Lowella Grip III M.D.   On: 01/15/2018 15:50    Procedures .Critical Care Performed by: Duffy Bruce, MD Authorized by: Duffy Bruce, MD   Critical care provider statement:    Critical care time (minutes):  45   Critical care time was exclusive of:  Separately billable procedures and treating other patients and teaching time   Critical care was necessary to treat or prevent imminent or life-threatening deterioration of the following conditions:  Cardiac failure and circulatory failure   Critical care was time spent personally by me on the following activities:  Development of treatment plan with patient or  surrogate, discussions with consultants, evaluation of patient's response to treatment, examination of patient, obtaining history from patient or surrogate, ordering and performing treatments and interventions, ordering and review of laboratory studies, ordering and review of radiographic studies, pulse oximetry, re-evaluation of patient's condition and review of old charts   I assumed direction of critical care for this patient from another provider in my specialty: no     (including critical care time)  Medications Ordered in ED Medications  diltiazem (CARDIZEM) 1 mg/mL load via infusion 10 mg (10 mg Intravenous Bolus from Bag 01/15/18 1630)    And  diltiazem (CARDIZEM) 100 mg in dextrose 5% 152mL (1 mg/mL) infusion (5 mg/hr Intravenous New Bag/Given 01/15/18 1628)  diltiazem (CARDIZEM) injection 20 mg (20 mg Intravenous Given 01/15/18 1531)  rivaroxaban (XARELTO) tablet 20 mg (20 mg Oral Given 01/15/18 1627)     Initial Impression / Assessment and Plan / ED Course  I have reviewed the triage vital signs and the nursing notes.  Pertinent labs & imaging results that were available during my care of the patient were reviewed by me and considered in my medical decision making (see chart for details).  Clinical Course as of Jan 15 1638  Tue Jan 15, 7353  5476 42 year old male with history of A. fib, not currently compliant with anticoagulation, here with symptomatic palpitations.  EKG shows A. fib RVR with likely rate related changes.  He states his been taking his medications.  Will check screening labs, give diltiazem bolus as his metoprolol does not seem to be effective.  He does have some mild chest pain which I suspect is related to his rate.  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   [CI]  1637 Heart rate initially improved with diltiazem bolus but shortly return to the 120s.  Patient placed on diltiazem drip.  Electrolytes are at baseline.   He has mild baseline chronic kidney disease.  No anemia.  Back is normal.  Troponins negative.  D-dimer negative.  Will admit for A. fib RVR requiring diltiazem drip.   [CI]    Clinical Course User Index [CI] Duffy Bruce, MD     Final Clinical Impressions(s) / ED Diagnoses   Final diagnoses:  Atrial fibrillation with rapid ventricular response [facility])    ED Discharge Orders    None       Duffy Bruce, MD 01/15/18 [phone]

## 2018-01-15 NOTE — ED Notes (Signed)
ED TO INPATIENT HANDOFF REPORT  Name/Age/Gender Samuel Flynn 42 y.o. male  Code Status Code Status History    Date Active Date Inactive Code Status Order ID Comments User Context   04/10/2016 1229 04/14/2016 1530 Full Code [id number]  Willia Craze, NP ED   10/18/2015 2016 10/19/2015 2102 Full Code [id number]  Consuelo Pandy, PA-C ED      Home/SNF/Other Home  Chief Complaint Afib   Level of Care/Admitting Diagnosis ED Disposition    ED Disposition Condition [facility] Area: [facility] [100102]  Level of Care: Stepdown [14]  Admit to SDU based on following criteria: Cardiac Instability:  Patients experiencing chest pain, unconfirmed MI and stable, arrhythmias and CHF requiring medical management and potentially compromising patient's stability  Diagnosis: Atrial fibrillation, rapid [facility]) [[id number]]  Admitting Physician: Hosie Poisson [[id number]]  Attending Physician: Hosie Poisson [[id number]]  Estimated length of stay: past midnight tomorrow  Certification:: I certify this patient will need inpatient services for at least 2 midnights  PT Class (Do Not Modify): Inpatient [101]  PT Acc Code (Do Not Modify): Private [1]       Medical History Past Medical History:  Diagnosis Date  . Atrial fibrillation with RVR ([place])   . Cellulitis 04/2016   RT FOOT  . Gastric ulcer   . Gout   . Hypertension   . Paroxysmal A-fib ([place])   . Sleep apnea    USES CPAP  . Wears glasses     Allergies No Known Allergies  IV Location/Drains/Wounds Patient Lines/Drains/Airways Status   Active Line/Drains/Airways    Name:   Placement date:   Placement time:   Site:   Days:   Peripheral IV 01/15/18 Left Antecubital   01/15/18    1523    Antecubital   less than 1   Peripheral IV 01/15/18 Hand   01/15/18    1909    Hand   less than 1   Airway 5 mm   09/29/14    0739     1204   Incision (Closed) 09/29/14 Other (Comment) Right   09/29/14    0922     1204   Incision (Closed) 09/29/14 Arm   09/29/14    0955     1204          Labs/Imaging Results for orders placed or performed during the hospital encounter of 01/15/18 (from the past 48 hour(s))  CBC with Differential     Status: Abnormal   Collection Time: 01/15/18  3:25 PM  Result Value Ref Range   WBC 6.5 4.0 - 10.5 K/uL   RBC 5.14 4.22 - 5.81 MIL/uL   Hemoglobin 14.6 13.0 - 17.0 g/dL   HCT 42.1 39.0 - 52.0 %   MCV 81.9 78.0 - 100.0 fL   MCH 28.4 26.0 - 34.0 pg   MCHC 34.7 30.0 - 36.0 g/dL   RDW 16.7 (H) 11.5 - 15.5 %   Platelets 251 150 - 400 K/uL   Neutrophils Relative % 67 %   Neutro Abs 4.4 1.7 - 7.7 K/uL   Lymphocytes Relative 23 %   Lymphs Abs 1.5 0.7 - 4.0 K/uL   Monocytes Relative 7 %   Monocytes Absolute 0.5 0.1 - 1.0 K/uL   Eosinophils Relative 2 %   Eosinophils Absolute 0.1 0.0 - 0.7 K/uL   Basophils Relative 1 %   Basophils Absolute 0.0 0.0 - 0.1 K/uL    Comment: Performed at [facility], [place] [address], [place], [place] [address]  Basic metabolic panel     Status: Abnormal   Collection Time: 01/15/18  3:25 PM  Result Value Ref Range   Sodium 141 135 - 145 mmol/L   Potassium 4.0 3.5 - 5.1 mmol/L   Chloride 105 101 - 111 mmol/L   CO2 24 22 - 32 mmol/L   Glucose, Bld 105 (H) 65 - 99 mg/dL   BUN 23 (H) 6 - 20 mg/dL   Creatinine, Ser 1.40 (H) 0.61 - 1.24 mg/dL   Calcium 8.8 (L) 8.9 - 10.3 mg/dL   GFR calc non Af Amer >60 >60 mL/min   GFR calc Af Amer >60 >60 mL/min    Comment: (NOTE) The eGFR has been calculated using the CKD EPI equation. This calculation has not been validated in all clinical situations. eGFR's persistently <60 mL/min signify possible Chronic Kidney Disease.    Anion gap 12 5 - 15    Comment: Performed at [facility], [place] [address], [place], [place] [address]  Magnesium     Status: None   Collection Time: 01/15/18  3:25 PM  Result Value Ref Range   Magnesium 2.2 1.7 - 2.4 mg/dL    Comment:  Performed at [facility], [place] [address], [place], [place] [address]  Troponin I     Status: None   Collection Time: 01/15/18  3:25 PM  Result Value Ref Range   Troponin I <0.03 <0.03 ng/mL    Comment: Performed at [facility], [place] [address], [place], [place] [address]  Brain natriuretic peptide     Status: Abnormal   Collection Time: 01/15/18  3:25 PM  Result Value Ref Range   B Natriuretic Peptide 101.7 (H) 0.0 - 100.0 pg/mL    Comment: Performed at [facility], [place] [address], [place], [place] [address]  D-dimer, quantitative (not at [facility])     Status: None   Collection Time: 01/15/18  3:25 PM  Result Value Ref Range   D-Dimer, Quant <0.27 0.00 - 0.50 ug/mL-FEU    Comment: (NOTE) At the manufacturer cut-off of 0.50 ug/mL FEU, this assay has been documented to exclude PE with a sensitivity and negative predictive value of 97 to 99%.  At this time, this assay has not been approved by the FDA to exclude DVT/VTE. Results should be correlated with clinical presentation. Performed at [facility], [place] [address], [place], [place] [address]    Dg Chest 2 View  Result Date: 01/15/2018 CLINICAL DATA:  Atrial fibrillation EXAM: CHEST - 2 VIEW COMPARISON:  April 10, 2016 FINDINGS: There is no edema or consolidation. The heart size and pulmonary vascularity are normal. No adenopathy. No bone lesions. IMPRESSION: No edema or consolidation. Electronically Signed   By: Lowella Grip III M.D.   On: 01/15/2018 15:50    Pending Labs Unresulted Labs (From admission, onward)   Start     Ordered   01/15/18 1929  Urinalysis, Routine w reflex microscopic  Once,   R     01/15/18 1929   Signed and Held  HIV antibody (Routine Testing)  Once,   R     Signed and Held      Vitals/Pain Today's Vitals   01/15/18 1800 01/15/18 1806 01/15/18 1830 01/15/18 1900  BP: 113/71  120/81 126/85  Pulse: 96  (!) 110 98  Resp: (!)  22  (!) 22 (!) 22  Temp:  TempSrc:      SpO2: 97%  95% 96%  Weight:      Height:      PainSc:  5       Isolation Precautions No active isolations  Medications Medications  diltiazem (CARDIZEM) 1 mg/mL load via infusion 10 mg (10 mg Intravenous Bolus from Bag 01/15/18 1630)    And  diltiazem (CARDIZEM) 100 mg in dextrose 5% 11m (1 mg/mL) infusion (5 mg/hr Intravenous New Bag/Given 01/15/18 1628)  diltiazem (CARDIZEM) injection 20 mg (20 mg Intravenous Given 01/15/18 1531)  rivaroxaban (XARELTO) tablet 20 mg (20 mg Oral Given 01/15/18 1627)    Mobility walks

## 2018-01-15 NOTE — ED Triage Notes (Signed)
Patient c/o palpitations x 5 days. Reports "my a-fib is acting up." Reports taking metoprolol as prescribed but denies taking blood thinner since January. C/o generalized weakness.

## 2018-01-15 NOTE — H&P (Signed)
History and Physical    Samuel Flynn [id number] DOB: 07/28/1976 DOA: 01/15/2018  PCP: Marcial Pacas, DO  Patient coming from: Home.   I have personally briefly reviewed patient's old medical records in [place]  Chief Complaint: dizziness,sob and near syncope since Thursday.   HPI: Samuel Flynn is a 42 y.o. male with medical history significant of atrial fibrillation on xarelto, PUD, hypertension,Gout, sleep apnea comes in for worsening sob, right lower extremity edema , dizziness since last Thursday. He also complaints ofchest pain, left sided, intermittent, not related to activity or eating, comes in and goes spontaneously. He denies any orthopnea, or PND or syncope. He denies fevers or chills, vomiting or abdominal pain or headache or dysuria. On arrival to ED, he was found in afib with RVR, with rates in 140/min. He also reports non compliance to meds. Lab work significant for creatinine of 1.4 and BNP of 101.  He was referred to medical service for admission for afib. He was started on cardizem gtt.    Review of Systems: As per HPI otherwise 10 point review of systems negative.    Past Medical History:  Diagnosis Date  . Atrial fibrillation with RVR ([place])   . Cellulitis 04/2016   RT FOOT  . Gastric ulcer   . Gout   . Hypertension   . Paroxysmal A-fib ([place])   . Sleep apnea    USES CPAP  . Wears glasses     Past Surgical History:  Procedure Laterality Date  . ELBOW LIGAMENT RECONSTRUCTION Right 09/29/2014   Procedure: CORANOID FRACTURE FIXATION, POSSIBLE LIGAMENT REPAIR;  Surgeon: Nita Sells, MD;  Location: [facility];  Service: Orthopedics;  Laterality: Right;  Right elbow coranoid fracture fixation, possible ligament repair  . WISDOM TOOTH EXTRACTION       reports that he quit smoking about 1 years ago. His smoking use included cigars. He has never used smokeless tobacco. He reports that he drinks alcohol. He reports that  he does not use drugs.  No Known Allergies  Family History  Problem Relation Age of Onset  . Hypertension Paternal Uncle   . Hypertension Maternal Grandmother   . Hypertension Paternal Uncle   . Hypertension Paternal Uncle   . Hypertension Paternal Uncle   . Hypertension Paternal Uncle    Family history reviewed.   Prior to Admission medications   Medication Sig Start Date End Date Taking? Authorizing Provider  allopurinol (ZYLOPRIM) 100 MG tablet Take 100 mg by mouth daily. For gout   Yes [provider]  amLODipine (NORVASC) 10 MG tablet Take 10 mg by mouth daily. 01/05/18  Yes [provider]  colchicine 0.6 MG tablet Take 0.6 mg by mouth daily as needed (gout flare).  08/20/15  Yes [provider]  ibuprofen (ADVIL,MOTRIN) 200 MG tablet Take 800 mg by mouth every 6 (six) hours as needed for headache or moderate pain.   Yes [provider]  lisinopril (PRINIVIL,ZESTRIL) 20 MG tablet Take 20 mg by mouth daily. For blood pressure 10/07/15  Yes [provider]  metoprolol tartrate (LOPRESSOR) 25 MG tablet Take 25 mg by mouth daily. 01/04/18  Yes [provider]  Omega-3 Fatty Acids (FISH OIL PO) Take 1 tablet by mouth daily.   Yes [provider]  pantoprazole (PROTONIX) 40 MG tablet TAKE 1 TABLET (40 MG TOTAL) BY MOUTH DAILY. NEED OV. 01/07/18  Yes Hilty, Nadean Corwin, MD  rivaroxaban (XARELTO) 20 MG TABS tablet Take 1  tablet (20 mg total) by mouth daily with supper. 08/15/17  Yes Skeet Latch, MD  doxycycline (VIBRAMYCIN) 100 MG capsule Take 1 capsule (100 mg total) by mouth 2 (two) times daily. Patient not taking: Reported on 01/15/2018 04/14/16   Florencia Reasons, MD  metoprolol tartrate (LOPRESSOR) 25 MG tablet Take 0.5 tablets (12.5 mg total) by mouth 2 (two) times daily. Patient not taking: Reported on 01/15/2018 04/14/16   Florencia Reasons, MD  ondansetron (ZOFRAN ODT) 4 MG disintegrating tablet Take 1 tablet (4 mg total) by mouth every 8  (eight) hours as needed for nausea. Patient not taking: Reported on 01/15/2018 01/18/17   Tanna Furry, MD  oxyCODONE-acetaminophen (PERCOCET/ROXICET) 5-325 MG tablet Take 2 tablets by mouth every 4 (four) hours as needed. Patient not taking: Reported on 01/15/2018 01/18/17   Tanna Furry, MD  predniSONE (DELTASONE) 10 MG tablet Take 1 tablet (10 mg total) by mouth daily with breakfast. Label  & dispense according to the schedule below. 6Pills PO on day one then, 5 Pills PO on day two, 4 Pills PO on day three, 3 Pills PO on day four, 2 Pills PO on day five, 1 Pills PO on day six,  then STOP. Patient not taking: Reported on 01/15/2018 04/14/16   Florencia Reasons, MD  tamsulosin (FLOMAX) 0.4 MG CAPS capsule Take 1 capsule (0.4 mg total) by mouth daily. Patient not taking: Reported on 01/15/2018 01/18/17   Tanna Furry, MD  XARELTO 20 MG TABS tablet TAKE 1 TABLET BY MOUTH DAILY WITH SUPPER. NEED MD APPT FOR REFILLS Patient not taking: Reported on 01/15/2018 08/20/17   Skeet Latch, MD    Physical Exam: Vitals:   01/15/18 1730 01/15/18 1800 01/15/18 1830 01/15/18 1900  BP: (!) 115/54 113/71 120/81 126/85  Pulse: 87 96 (!) 110 98  Resp: (!) 23 (!) 22 (!) 22 (!) 22  Temp:      TempSrc:      SpO2: 96% 97% 95% 96%  Weight:      Height:        Constitutional: NAD, calm, comfortable Vitals:   01/15/18 1730 01/15/18 1800 01/15/18 1830 01/15/18 1900  BP: (!) 115/54 113/71 120/81 126/85  Pulse: 87 96 (!) 110 98  Resp: (!) 23 (!) 22 (!) 22 (!) 22  Temp:      TempSrc:      SpO2: 96% 97% 95% 96%  Weight:      Height:       Eyes: PERRL, lids and conjunctivae normal ENMT: Mucous membranes are moist. Posterior pharynx clear of any exudate or lesions.Normal dentition.  Neck: normal, supple, no masses, no thyromegaly Respiratory: clear to auscultation bilaterally, no wheezing, no crackles. Normal respiratory effort. No accessory muscle use.  Cardiovascular: Regular rate and rhythm, no murmurs / rubs / gallops.  No extremity edema. 2+ pedal pulses. No carotid bruits.  Abdomen: no tenderness, no masses palpated. No hepatosplenomegaly. Bowel sounds positive.  Musculoskeletal: RIGHT leg more edematous than the left lower extremity.  Skin: no rashes, lesions, ulcers. No induration Neurologic: CN 2-12 grossly intact. Sensation intact, DTR normal. Strength 5/5 in all 4.  Psychiatric:  Alert and oriented x 3. Normal mood.     Labs on Admission: I have personally reviewed following labs and imaging studies  CBC: Recent Labs  Lab 01/15/18 1525  WBC 6.5  NEUTROABS 4.4  HGB 14.6  HCT 42.1  MCV 81.9  PLT 629   Basic Metabolic Panel: Recent Labs  Lab 01/15/18 1525  NA 141  K 4.0  CL 105  CO2 24  GLUCOSE 105*  BUN 23*  CREATININE 1.40*  CALCIUM 8.8*  MG 2.2   GFR: Estimated Creatinine Clearance: 101.6 mL/min (A) (by C-G formula based on SCr of 1.4 mg/dL (H)). Liver Function Tests: No results for input(s): AST, ALT, ALKPHOS, BILITOT, PROT, ALBUMIN in the last 168 hours. No results for input(s): LIPASE, AMYLASE in the last 168 hours. No results for input(s): AMMONIA in the last 168 hours. Coagulation Profile: No results for input(s): INR, PROTIME in the last 168 hours. Cardiac Enzymes: Recent Labs  Lab 01/15/18 1525  TROPONINI <0.03   BNP (last 3 results) No results for input(s): PROBNP in the last 8760 hours. HbA1C: No results for input(s): HGBA1C in the last 72 hours. CBG: No results for input(s): GLUCAP in the last 168 hours. Lipid Profile: No results for input(s): CHOL, HDL, LDLCALC, TRIG, CHOLHDL, LDLDIRECT in the last 72 hours. Thyroid Function Tests: No results for input(s): TSH, T4TOTAL, FREET4, T3FREE, THYROIDAB in the last 72 hours. Anemia Panel: No results for input(s): VITAMINB12, FOLATE, FERRITIN, TIBC, IRON, RETICCTPCT in the last 72 hours. Urine analysis:    Component Value Date/Time   COLORURINE YELLOW 01/18/2017 0646   APPEARANCEUR HAZY (A) 01/18/2017 0646     LABSPEC 1.018 01/18/2017 0646   PHURINE 5.0 01/18/2017 0646   GLUCOSEU NEGATIVE 01/18/2017 0646   HGBUR LARGE (A) 01/18/2017 0646   HGBUR negative 08/26/2007 1203   BILIRUBINUR NEGATIVE 01/18/2017 0646   KETONESUR 5 (A) 01/18/2017 0646   PROTEINUR 30 (A) 01/18/2017 0646   UROBILINOGEN 1.0 11/06/2014 1352   NITRITE NEGATIVE 01/18/2017 0646   LEUKOCYTESUR NEGATIVE 01/18/2017 0646    Radiological Exams on Admission: Dg Chest 2 View  Result Date: 01/15/2018 CLINICAL DATA:  Atrial fibrillation EXAM: CHEST - 2 VIEW COMPARISON:  April 10, 2016 FINDINGS: There is no edema or consolidation. The heart size and pulmonary vascularity are normal. No adenopathy. No bone lesions. IMPRESSION: No edema or consolidation. Electronically Signed   By: Lowella Grip III M.D.   On: 01/15/2018 15:50    EKG: Independently reviewed. Atrial fib with RVR.   Assessment/Plan Active Problems:   Atrial fibrillation, rapid ([facility])     Atrial fib with RVR: Possibly from non compliance to meds and pt appears to be in mild  fluid overload.  Admit to stepdown, started him cardizem gtt and xarelto.  Restart his home medications.  Currently chest pain free,  Get serial troponin's and echocardiogram. Cardiology will be consulted.  CXR does not show any pulm edema.    Hypertension:  Well controlled.    Gout; Resume colchicine.    PUD: Resume protonix.    DVT prophylaxis: xarelto Code Status: full code.  Family Communication: family at bedside.  Disposition Plan: admit to stepdown pending resolution of afib.  Consults called: cardiology.  Admission status: inpatient/ sdu   Hosie Poisson MD [facility] Pager [phone]   If 7PM-7AM, please contact night-coverage [email or web address] Password [facility]  01/15/2018, 7:22 PM

## 2018-01-15 NOTE — ED Notes (Signed)
Admitting Provider at bedside. 

## 2018-01-15 NOTE — ED Notes (Signed)
Patient has been transported to radiology.

## 2018-01-16 ENCOUNTER — Inpatient Hospital Stay (HOSPITAL_COMMUNITY): Payer: PRIVATE HEALTH INSURANCE

## 2018-01-16 DIAGNOSIS — I4891 Unspecified atrial fibrillation: Secondary | ICD-10-CM

## 2018-01-16 DIAGNOSIS — I480 Paroxysmal atrial fibrillation: Principal | ICD-10-CM

## 2018-01-16 DIAGNOSIS — Z9989 Dependence on other enabling machines and devices: Secondary | ICD-10-CM

## 2018-01-16 DIAGNOSIS — R609 Edema, unspecified: Secondary | ICD-10-CM

## 2018-01-16 DIAGNOSIS — Z720 Tobacco use: Secondary | ICD-10-CM

## 2018-01-16 DIAGNOSIS — R0789 Other chest pain: Secondary | ICD-10-CM

## 2018-01-16 DIAGNOSIS — G4733 Obstructive sleep apnea (adult) (pediatric): Secondary | ICD-10-CM

## 2018-01-16 DIAGNOSIS — E669 Obesity, unspecified: Secondary | ICD-10-CM

## 2018-01-16 DIAGNOSIS — N183 Chronic kidney disease, stage 3 (moderate): Secondary | ICD-10-CM

## 2018-01-16 LAB — LIPID PANEL
Cholesterol: 211 mg/dL — ABNORMAL HIGH (ref 0–200)
HDL: 24 mg/dL — ABNORMAL LOW (ref >40)
LDL CALC: 122 mg/dL — AB (ref 0–99)
Total CHOL/HDL Ratio: 8.8 RATIO
Triglycerides: 323 mg/dL — ABNORMAL HIGH (ref <150)
VLDL: 65 mg/dL — AB (ref 0–40)

## 2018-01-16 LAB — TROPONIN I: Troponin I: <0.03 ng/mL (ref <0.03)

## 2018-01-16 LAB — ECHOCARDIOGRAM COMPLETE
Height: 74 in
WEIGHTICAEL: 4560.88 [oz_av]

## 2018-01-16 LAB — TSH: TSH: 1.575 u[IU]/mL (ref 0.350–4.500)

## 2018-01-16 MED ORDER — COLCHICINE 0.6 MG PO TABS
0.6000 mg | ORAL_TABLET | Freq: Every day | ORAL | Status: DC | PRN
  Administered 2018-01-18: 0.6 mg via ORAL
  Filled 2018-01-16 (×2): qty 1

## 2018-01-16 MED ORDER — PANTOPRAZOLE SODIUM 40 MG PO TBEC
40.0000 mg | DELAYED_RELEASE_TABLET | Freq: Every day | ORAL | Status: DC
  Administered 2018-01-16 – 2018-01-19 (×4): 40 mg via ORAL
  Filled 2018-01-16 (×4): qty 1

## 2018-01-16 MED ORDER — METOPROLOL TARTRATE 25 MG PO TABS
25.0000 mg | ORAL_TABLET | Freq: Every day | ORAL | Status: DC
  Administered 2018-01-16 – 2018-01-18 (×3): 25 mg via ORAL
  Filled 2018-01-16 (×3): qty 1

## 2018-01-16 MED ORDER — RIVAROXABAN 20 MG PO TABS: 20.0000 mg | ORAL_TABLET | Freq: Every day | ORAL | Status: DC

## 2018-01-16 MED ORDER — ALLOPURINOL 100 MG PO TABS
100.0000 mg | ORAL_TABLET | Freq: Every day | ORAL | Status: DC
  Administered 2018-01-16 – 2018-01-19 (×4): 100 mg via ORAL
  Filled 2018-01-16 (×4): qty 1

## 2018-01-16 MED ORDER — RIVAROXABAN 20 MG PO TABS
20.0000 mg | ORAL_TABLET | Freq: Every day | ORAL | Status: DC
  Administered 2018-01-16 – 2018-01-18 (×3): 20 mg via ORAL
  Filled 2018-01-16 (×3): qty 1

## 2018-01-16 MED ORDER — LISINOPRIL 10 MG PO TABS
20.0000 mg | ORAL_TABLET | Freq: Every day | ORAL | Status: DC
  Administered 2018-01-16 – 2018-01-19 (×4): 20 mg via ORAL
  Filled 2018-01-16 (×4): qty 2

## 2018-01-16 NOTE — Progress Notes (Signed)
Cardiology team made aware of pt's 8 beat run of VT at 1224pm and frequent PVCs.  Will continue to monitor.

## 2018-01-16 NOTE — Progress Notes (Signed)
Pt. notified staff that he uses CPAP h/s, pt. assessed, order obtained, pt. set up with/placed on CPAP at 15 cmh20, per home regimen, humidifier filled with S/W is currently on room air, family member remains at bedside, tolerating well, RN made aware, RT to monitor

## 2018-01-16 NOTE — Progress Notes (Signed)
PROGRESS NOTE   Samuel Flynn  ZJI:[id number]    DOB: 02/18/1976    DOA: 01/15/2018  PCP: Marcial Pacas, DO   I have briefly reviewed patients previous medical records in [facility].  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  He was noted to be in A. fib with RVR.  Admitted to stepdown.  Cardiology consulted and plan TEE and cardioversion on 01/18/2018.   Assessment & Plan:   Active Problems:   Atrial fibrillation, rapid ([place])   PAF with RVR: Reports compliance with metoprolol but not Xarelto.  Admitted to stepdown.  Started on IV Cardizem drip and rate currently controlled in the 80s-100s on Cardizem 7.5 mg/h.  Continued home dose of metoprolol 25 mg daily.  Xarelto resumed and compliance extensively counseled to patient and spouse at bedside.  His symptoms on admission were likely due to RVR.  Cardiology input appreciated and plan TEE guided cardioversion on 01/18/2018.  TSH normal.  Follow repeat echo.  Essential hypertension: Mildly uncontrolled.  Continue lisinopril, metoprolol and IV Cardizem drip.  Amlodipine currently on hold to avoid hypotension.  OSA: Continue nightly CPAP.  Stage III chronic kidney disease: Creatinine stable and at baseline.  Atypical chest pain/noncardiac chest pain: Likely musculoskeletal in origin.  Nonexertional.  Previous stress test 11/11/2015 was negative.  Currently resolved/stable.  Cardiology seen and do not plan to pursue ischemia evaluation.  Right lower extremity swelling/tingling and numbness: Swelling apparently is chronic.  Right lower extremity venous Doppler negative for DVT.  No focal deficits.  Outpatient follow-up.  Tobacco  abuse: Cessation counseled.  Obesity/Body mass index is 36.6 kg/m.    DVT prophylaxis: Xarelto resumed Code Status: Full Family Communication: Discussed in detail with patient's spouse at bedside Disposition: Admitted to stepdown, continue.  DC home pending clinical improvement and further interventions by cardiology, possibly 01/19/2018.   Consultants:  Cardiology  Procedures:  CPAP.  Antimicrobials:  None   Subjective: Reports ongoing palpitations but not as fast as on admission.  No dyspnea or chest pain since admission.  Chronic right leg swelling and about 1 week history of intermittent tingling and numbness without weakness.  Denies low back pain or sphincter related complaints.  States that his weight goes "up and down".  As per RN, no acute issues.  ROS: As above.  Objective:  Vitals:   01/16/18 0100 01/16/18 0400 01/16/18 0754 01/16/18 0800  BP: (!) 131/91 (!) 95/52  (!) 121/96  Pulse:      Resp:  14  19  Temp:  (!) 97.5 F (36.4 C) 97.7 F (36.5 C)   TempSrc:  Axillary    SpO2: 98% 99%  96%  Weight:      Height:        Examination:  General exam: Pleasant young male, moderately built and obese, lying comfortably propped up in bed. Respiratory system: Clear to auscultation. Respiratory effort normal. Cardiovascular system: S1 & S2 heard, RRR. No JVD, murmurs, rubs, gallops or clicks. No pedal edema.  Telemetry personally reviewed: A. fib with ventricular rate ranging between 80-100s. Gastrointestinal system: Abdomen is nondistended, soft and nontender. No organomegaly or masses felt. Normal bowel sounds heard. Central nervous system: Alert and oriented. No focal  neurological deficits. Extremities: Symmetric 5 x 5 power. Skin: No rashes, lesions or ulcers Psychiatry: Judgement and insight appear normal. Mood & affect appropriate.     Data Reviewed: I have personally reviewed following labs and imaging studies  CBC: Recent Labs  Lab 01/15/18 1525    WBC 6.5  NEUTROABS 4.4  HGB 14.6  HCT 42.1  MCV 81.9  PLT 440   Basic Metabolic Panel: Recent Labs  Lab 01/15/18 1525  NA 141  K 4.0  CL 105  CO2 24  GLUCOSE 105*  BUN 23*  CREATININE 1.40*  CALCIUM 8.8*  MG 2.2   Cardiac Enzymes: Recent Labs  Lab 01/15/18 1525 01/16/18 0916  TROPONINI <0.03 <0.03     Recent Results (from the past 240 hour(s))  MRSA PCR Screening     Status: None   Collection Time: 01/15/18  8:43 PM  Result Value Ref Range Status   MRSA by PCR NEGATIVE NEGATIVE Final    Comment:        The GeneXpert MRSA Assay (FDA approved for NASAL specimens only), is one component of a comprehensive MRSA colonization surveillance program. It is not intended to diagnose MRSA infection nor to guide or monitor treatment for MRSA infections. Performed at [facility], [address], [place], [place] [address]          Radiology Studies: Dg Chest 2 View  Result Date: 01/15/2018 CLINICAL DATA:  Atrial fibrillation EXAM: CHEST - 2 VIEW COMPARISON:  April 10, 2016 FINDINGS: There is no edema or consolidation. The heart size and pulmonary vascularity are normal. No adenopathy. No bone lesions. IMPRESSION: No edema or consolidation. Electronically Signed   By: Lowella Grip III M.D.   On: 01/15/2018 15:50        Scheduled Meds: . allopurinol  100 mg Oral Daily  . lisinopril  20 mg Oral Daily  . metoprolol tartrate  25 mg Oral Daily  . pantoprazole  40 mg Oral Daily  . rivaroxaban  20 mg Oral Q supper   Continuous Infusions: . diltiazem (CARDIZEM) infusion 7.5 mg/hr (01/16/18 0845)     LOS: 1 day     Vernell Leep, MD, FACP, [facility]. [facility] Pager [phone]  If 7PM-7AM, please contact night-coverage [email or web address] Password [email or web address] 01/16/2018, 11:14 AM

## 2018-01-16 NOTE — Progress Notes (Signed)
  Echocardiogram 2D Echocardiogram has been performed.  [name] 01/16/2018, 11:25 AM

## 2018-01-16 NOTE — Consult Note (Addendum)
Cardiology Consultation:   Patient ID: Samuel Flynn; [id number]; 06/12/1976   Admit date: 01/15/2018 Date of Consult: 01/16/2018  Primary Care Provider: Marcial Pacas, DO Primary Cardiologist: New-Dr. Kirk Ruths  Patient Profile:   Samuel Flynn is a 42 y.o. male with a hx of atrial fibrillation noncompliant with Xarelto, hypertension, obesity and OSA on CPAP who is being seen today for the evaluation of atrial fibrillation with RVR at the request of Dr. Algis Liming.  History of Present Illness:   Mr. Samuel Flynn is a 42 year old male with a history stated above who presented to [facility] on 01/15/2018 with complaints of worsening shortness of breath with dizziness for the last several days.  Patient states that he has had intermittent palpitations at least several times per month since his atrial fibrillation diagnosis in 10/2015. He states they resolve on their own and he is usually not short of breath or dizzy with these episodes.   Patient states noncompliance with Xarelto since approximately 09/2017.  After his initial hospital follow-up appointment, he has not seen cardiology since his diagnosis.  Additionally, he states that he is had left anterior point chest pain described as sharp.  He reports that the pains are also intermittent and cannot be associated with his palpitations.  He states the sharp chest pains only last a couple seconds in duration and he has no associated symptoms.  He denies orthopnea, PND, diaphoresis, nausea or vomiting.  He states these chest pains do not radiate to his arm back or jaw.  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  He reports MI in his father at unknown age.  Patient reports smoking cessation several weeks ago.  In the ED, patient was found to be in atrial fibrillation with RVR with rates elevated to 140bpm. Patient reports noncompliance with medications.  Troponin level was drawn which was negative at <0.03.   His BNP was reported at 101.  A d-dimer was drawn which was negative at 0.27.  The patient was started on a diltiazem gtt with moderate rate control.  Of note, the patient was last seen during a hospital admission back in 10/2015 for atrial fibrillation with RVR.  This appears to be his first diagnosis in which he was started on Xarelto.  Fortunately, at that time he spontaneously converted with IV diltiazem and no TEE cardioversion was performed.  He did however develop sinus bradycardia with rates in the 40s in which the Cardizem was discontinued.  He was later placed on metoprolol 25 mg twice per day with a stabilized rate.  Echocardiogram was performed which revealed an LVEF of 60 to 65% no wall motion abnormalities.  Patient presented for follow-up on 11/02/2015 with Dr. Oval Linsey but has not been seen since that time.   Past Medical History:  Diagnosis Date  . Atrial fibrillation with RVR ([place])   . Cellulitis 04/2016   RT FOOT  . Gastric ulcer   . Gout   . Hypertension   . Paroxysmal A-fib ([place])   . Sleep apnea    USES CPAP  . Wears glasses     Past Surgical History:  Procedure Laterality Date  . ELBOW LIGAMENT RECONSTRUCTION Right 09/29/2014   Procedure: CORANOID FRACTURE FIXATION, POSSIBLE LIGAMENT REPAIR;  Surgeon: Nita Sells, MD;  Location: [facility];  Service: Orthopedics;  Laterality: Right;  Right elbow coranoid fracture fixation, possible ligament repair  . WISDOM TOOTH EXTRACTION       Prior  to Admission medications   Medication Sig Start Date End Date Taking? Authorizing Provider  allopurinol (ZYLOPRIM) 100 MG tablet Take 100 mg by mouth daily. For gout   Yes [provider]  amLODipine (NORVASC) 10 MG tablet Take 10 mg by mouth daily. 01/05/18  Yes [provider]  colchicine 0.6 MG tablet Take 0.6 mg by mouth daily as needed (gout flare).  08/20/15  Yes [provider]  ibuprofen (ADVIL,MOTRIN) 200 MG tablet Take 800 mg  by mouth every 6 (six) hours as needed for headache or moderate pain.   Yes [provider]  lisinopril (PRINIVIL,ZESTRIL) 20 MG tablet Take 20 mg by mouth daily. For blood pressure 10/07/15  Yes [provider]  metoprolol tartrate (LOPRESSOR) 25 MG tablet Take 25 mg by mouth daily. 01/04/18  Yes [provider]  Omega-3 Fatty Acids (FISH OIL PO) Take 1 tablet by mouth daily.   Yes [provider]  pantoprazole (PROTONIX) 40 MG tablet TAKE 1 TABLET (40 MG TOTAL) BY MOUTH DAILY. NEED OV. 01/07/18  Yes Hilty, Nadean Corwin, MD  rivaroxaban (XARELTO) 20 MG TABS tablet Take 1 tablet (20 mg total) by mouth daily with supper. 08/15/17  Yes Skeet Latch, MD  doxycycline (VIBRAMYCIN) 100 MG capsule Take 1 capsule (100 mg total) by mouth 2 (two) times daily. Patient not taking: Reported on 01/15/2018 04/14/16   Florencia Reasons, MD  metoprolol tartrate (LOPRESSOR) 25 MG tablet Take 0.5 tablets (12.5 mg total) by mouth 2 (two) times daily. Patient not taking: Reported on 01/15/2018 04/14/16   Florencia Reasons, MD  ondansetron (ZOFRAN ODT) 4 MG disintegrating tablet Take 1 tablet (4 mg total) by mouth every 8 (eight) hours as needed for nausea. Patient not taking: Reported on 01/15/2018 01/18/17   Tanna Furry, MD  oxyCODONE-acetaminophen (PERCOCET/ROXICET) 5-325 MG tablet Take 2 tablets by mouth every 4 (four) hours as needed. Patient not taking: Reported on 01/15/2018 01/18/17   Tanna Furry, MD  predniSONE (DELTASONE) 10 MG tablet Take 1 tablet (10 mg total) by mouth daily with breakfast. Label  & dispense according to the schedule below. 6Pills PO on day one then, 5 Pills PO on day two, 4 Pills PO on day three, 3 Pills PO on day four, 2 Pills PO on day five, 1 Pills PO on day six,  then STOP. Patient not taking: Reported on 01/15/2018 04/14/16   Florencia Reasons, MD  tamsulosin (FLOMAX) 0.4 MG CAPS capsule Take 1 capsule (0.4 mg total) by mouth daily. Patient not taking: Reported on 01/15/2018 01/18/17   Tanna Furry, MD  XARELTO 20 MG TABS tablet TAKE 1 TABLET BY MOUTH DAILY WITH SUPPER. NEED MD APPT FOR REFILLS Patient not taking: Reported on 01/15/2018 08/20/17   Skeet Latch, MD    Inpatient Medications: Scheduled Meds: . allopurinol  100 mg Oral Daily  . lisinopril  20 mg Oral Daily  . metoprolol tartrate  25 mg Oral Daily  . pantoprazole  40 mg Oral Daily  . rivaroxaban  20 mg Oral Q supper   Continuous Infusions: . diltiazem (CARDIZEM) infusion 5 mg/hr (01/16/18 0400)   PRN Meds: colchicine  Allergies:   No Known Allergies  Social History:   Social History   Socioeconomic History  . Marital status: Married    Spouse name: Not on file  . Number of children: Not on file  . Years of education: Not on file  . Highest education level: Not on file  Occupational History  . Not on  file  Social Needs  . Financial resource strain: Not on file  . Food insecurity:    Worry: Not on file    Inability: Not on file  . Transportation needs:    Medical: Not on file    Non-medical: Not on file  Tobacco Use  . Smoking status: Former Smoker    Types: Cigars    Last attempt to quit: 02/03/2016    Years since quitting: 1.9  . Smokeless tobacco: Never Used  Substance and Sexual Activity  . Alcohol use: Yes    Alcohol/week: 0.0 oz    Comment: occ  . Drug use: No  . Sexual activity: Yes    Comment: 5 a day  Lifestyle  . Physical activity:    Days per week: Not on file    Minutes per session: Not on file  . Stress: Not on file  Relationships  . Social connections:    Talks on phone: Not on file    Gets together: Not on file    Attends religious service: Not on file    Active member of club or organization: Not on file    Attends meetings of clubs or organizations: Not on file    Relationship status: Not on file  . Intimate partner violence:    Fear of current or ex partner: Not on file    Emotionally abused: Not on file    Physically abused: Not on file    Forced sexual  activity: Not on file  Other Topics Concern  . Not on file  Social History Narrative  . Not on file    Family History:   Family History  Problem Relation Age of Onset  . Hypertension Paternal Uncle   . Hypertension Maternal Grandmother   . Hypertension Paternal Uncle   . Hypertension Paternal Uncle   . Hypertension Paternal Uncle   . Hypertension Paternal Uncle    Family Status:  Family Status  Relation Name Status  . Annamarie Major  (Not Specified)  . MGM  (Not Specified)  . Annamarie Major  (Not Specified)  . Annamarie Major  (Not Specified)  . Annamarie Major  (Not Specified)  . Annamarie Major  (Not Specified)    ROS:  Please see the history of present illness.  All other ROS reviewed and negative.     Physical Exam/Data:   Vitals:   01/16/18 0031 01/16/18 0100 01/16/18 0400 01/16/18 0754  BP:  (!) 131/91 (!) 95/52   Pulse:      Resp:   14   Temp: (!) 97.2 F (36.2 C)  (!) 97.5 F (36.4 C) 97.7 F (36.5 C)  TempSrc: Axillary  Axillary   SpO2:  98% 99%   Weight:      Height:        Intake/Output Summary (Last 24 hours) at 01/16/2018 0829 Last data filed at 01/15/2018 2100 Gross per 24 hour  Intake -  Output 500 ml  Net -500 ml   Filed Weights   01/15/18 1622 01/15/18 2038  Weight: (!) 310 lb (140.6 kg) 285 lb 0.9 oz (129.3 kg)   Body mass index is 36.6 kg/m.   General: Well developed, well nourished, NAD Skin: Warm, dry, intact  Head: Normocephalic, atraumatic, clear, moist mucus membranes. Neck: Negative for carotid bruits. No JVD Lungs:Clear to ausculation bilaterally. No wheezes, rales, or rhonchi. Breathing is unlabored. Cardiovascular: Irregularly irregular with S1 S2. No murmurs, rubs or gallops Abdomen: Soft, non-tender, non-distended with normoactive bowel sounds.No obvious  abdominal masses. MSK: Strength and tone appear normal for age. 5/5 in all extremities Extremities: Mild LE edema. No clubbing or cyanosis. DP/PT pulses 1+ bilaterally Neuro: Alert and oriented.  No focal deficits. No facial asymmetry. MAE spontaneously. Psych: Responds to questions appropriately with normal affect.     EKG:  The EKG was personally reviewed and demonstrates: 01/15/2018 atrial fibrillation with RVR heart rate 142 Telemetry:  Telemetry was personally reviewed and demonstrates: 01/16/2018 atrial fibrillation with heart rate 109  Relevant CV Studies:  ECHO: Echocardiogram 10/19/2015:  Study Conclusions  - Left ventricle: The cavity size was normal. There was mild focal   basal hypertrophy of the septum. Systolic function was normal.   The estimated ejection fraction was in the range of 60% to 65%.   Wall motion was normal; there were no regional wall motion   abnormalities. - Mitral valve: Calcified annulus.  CATH: None  Laboratory Data:  Chemistry Recent Labs  Lab 01/15/18 1525  NA 141  K 4.0  CL 105  CO2 24  GLUCOSE 105*  BUN 23*  CREATININE 1.40*  CALCIUM 8.8*  GFRNONAA >60  GFRAA >60  ANIONGAP 12    Total Protein  Date Value Ref Range Status  01/18/2017 7.2 6.5 - 8.1 g/dL Final   Albumin  Date Value Ref Range Status  01/18/2017 4.1 3.5 - 5.0 g/dL Final   AST  Date Value Ref Range Status  01/18/2017 17 15 - 41 U/L Final   ALT  Date Value Ref Range Status  01/18/2017 16 (L) 17 - 63 U/L Final   Alkaline Phosphatase  Date Value Ref Range Status  01/18/2017 55 38 - 126 U/L Final   Total Bilirubin  Date Value Ref Range Status  01/18/2017 1.5 (H) 0.3 - 1.2 mg/dL Final   Hematology Recent Labs  Lab 01/15/18 1525  WBC 6.5  RBC 5.14  HGB 14.6  HCT 42.1  MCV 81.9  MCH 28.4  MCHC 34.7  RDW 16.7*  PLT 251   Cardiac Enzymes Recent Labs  Lab 01/15/18 1525  TROPONINI <0.03   No results for input(s): TROPIPOC in the last 168 hours.  BNP Recent Labs  Lab 01/15/18 1525  BNP 101.7*    DDimer  Recent Labs  Lab 01/15/18 1525  DDIMER <0.27   TSH:  Lab Results  Component Value Date   TSH 2.040 04/10/2016    Lipids: Lab Results  Component Value Date   CHOL 211 (H) 10/19/2015   HDL 32 (L) 10/19/2015   LDLCALC 124 (H) 10/19/2015   TRIG 273 (H) 10/19/2015   CHOLHDL 6.6 10/19/2015   HgbA1c: Lab Results  Component Value Date   HGBA1C 5.7 (H) 10/18/2015    Radiology/Studies:  Dg Chest 2 View  Result Date: 01/15/2018 CLINICAL DATA:  Atrial fibrillation EXAM: CHEST - 2 VIEW COMPARISON:  April 10, 2016 FINDINGS: There is no edema or consolidation. The heart size and pulmonary vascularity are normal. No adenopathy. No bone lesions. IMPRESSION: No edema or consolidation. Electronically Signed   By: Lowella Grip III M.D.   On: 01/15/2018 15:50   Assessment and Plan:   1.  Atrial fibrillation with rapid ventricular response: -Patient presents to [facility] ED on 01/15/2018 with complaints of shortness of breath and dizziness for several days.  He states that he has had intermittent palpitations since his initial atrial fibrillation diagnosis in 2017, usually occurring several times per month.  In the ED, he was found to be in atrial fibrillation with  RVR.  Per chart review, it appears that this patient has had a history of atrial fibrillation dating back to 10/2015.  At that time cardiology was consulted, he was started on diltiazem drip and spontaneously converted on his own to normal sinus rhythm/sinus bradycardia.  Medications were adjusted and was ultimately rate controlled.  He was started on Xarelto at this time.  He followed with Dr. Oval Linsey shortly thereafter and has not been seen in our office since.  He reports noncompliance with Xarelto since 09/2017. -Will check TSH, last TSH 2.04 2017 -Currently on diltiazem gtt at 7.5 mg/hr -On home lisinopril 20 mg, metoprolol 25 mg and Xarelto 20 mg (not taking) -If patient does not spontaneously convert to NSR, will need to set him up for TEE cardioversion once 3 consecutive Xarelto doses have been given. -Will need education regarding medication  compliance for stroke prevention in the future -CHA2DS2VASc =1 (HTN)  2.  Chest pain: -Patient reports of sharp, point chest discomfort for the last month.  His pain does not radiate and he has had no associated symptoms.  He states that the pain lasts for several seconds and dissipates on its own -Troponin,<0.03.  Will continue to trend CE's  -EKG revealed atrial fibrillation with rapid ventricular response, 143bpm.  There are no acute ischemic ST-T wave abnormalities noted -Patient currently chest pain-free now that rate is controlled  3.  Hypertension: -Stable, 131/91, 104/31, 144/80 -Continue lisinopril, metoprolol, diltiazem  4.  History of bradycardia: -We will monitor heart rate closely -If there is sinus bradycardia, medications will be adjusted accordingly  5.  OSA: -Reports compliance with CPAP  6.  HLD: -Elevated, last LDL, 09/28/2015 -Will check lipid panel   For questions or updates, please contact [place] Please consult [email or web address] for contact info under Cardiology/STEMI.   Signed, Kathyrn Drown NP-C [facility] Pager: [phone] 01/16/2018 8:29 AM As above, patient seen and examined.  Briefly he is a 42 year old male with past medical history of paroxysmal atrial fibrillation, hypertension, chronic stage III kidney disease, obstructive sleep apnea with recurrent atrial fibrillation.  Patient has had bouts of atrial fibrillation 2-3 times monthly by his report.  They typically last less than 1 day.  He developed recurrent symptoms approximately 1 week ago.  He describes palpitations associated with dyspnea on exertion, dizziness and fatigue.  He has had brief chest pain in the left breast area that is point tenderness.  He does not have exertional chest pain.  Because of his symptoms he presented and was admitted with atrial fibrillation.  Cardiology asked to evaluate.  Note patient has not taken his Xarelto since January 2019.  Also note nuclear study March 2017  showed ejection fraction 52% and normal perfusion. Echo 2/17 showed normal LV function.   Laboratory significant for creatinine 1.40.  Enzymes negative.  Hemoglobin 14.6.  TSH 1.575.  D-dimer negative. Electrocardiogram shows atrial fibrillation with rapid ventricular response and nonspecific ST changes.  1 paroxysmal atrial fibrillation-patient has developed recurrent atrial fibrillation.  He is symptomatic with increased dyspnea on exertion and fatigue.  Continue Cardizem and metoprolol for rate control.  Continue Xarelto (CHADSvasc 1).  Repeat echocardiogram.  TSH is normal.  Given that he is symptomatic we will plan TEE guided cardioversion on Friday.  He would likely need an antiarrhythmic to help maintain sinus rhythm.  Would consider flecainide once sinus reestablished.  Patient would then require exercise treadmill in 1 to 2 weeks to exclude exercise ventricular arrhythmia.  2 hypertension-blood pressure is controlled.  Continue present medications.  3 tobacco abuse-patient counseled on discontinuing.  He states he has not smoked in the past 1 month.  4 stage III chronic kidney disease-follow renal function.  5 obstructive sleep apnea-continue CPAP and patient states he has been compliant.  6 chest pain-patient has point tenderness in left breast area.  No exertional chest pain.  Previous nuclear study negative.  We will not pursue further ischemia evaluation.  Kirk Ruths, MD

## 2018-01-16 NOTE — Progress Notes (Addendum)
RN notified by central tele of 8 beat run of VT at 1224pm and frequent PVCs. MD made aware. MD also made aware that no AM labs were drawn. MD stated that there is no need to have labs drawn at this time since labs 01/15/18 were stable. MD orders to make cardiology team aware. Will continue to monitor.

## 2018-01-16 NOTE — Progress Notes (Signed)
RN made aware by central tele that patient had a 2.3 second pause at 438pm. Cardiologist and hospitalist made aware. Cardiologist verified that HR and BP are within normal limits. No further orders at this time, continue current treatment plan. Will continue to monitor.

## 2018-01-16 NOTE — Progress Notes (Signed)
Preliminary notes--Right lower extremity venous duplex exam completed. Negative for DVT.  Prominent lymph node seen at groin area.   Samuel Flynn (RDMS RVT) 01/16/18 10:46 AM

## 2018-01-17 DIAGNOSIS — I10 Essential (primary) hypertension: Secondary | ICD-10-CM

## 2018-01-17 LAB — HIV ANTIBODY (ROUTINE TESTING W REFLEX): HIV SCREEN 4TH GENERATION: NONREACTIVE

## 2018-01-17 NOTE — Progress Notes (Signed)
Spoke with Janett Billow, RN who is aware that the patient has a TEE/Cardioversion scheduled at [facility]. [facility] aware of transfer request and will have patient here by 1100 on 01/18/18. Jobe Igo, RN

## 2018-01-17 NOTE — Progress Notes (Addendum)
Progress Note  Patient Name: Samuel Flynn Date of Encounter: 01/17/2018  Primary Cardiologist: New-Dr. Stanford Breed  Subjective   Pt feeling better today. Anticipating TEE/DCCV tomorrow. Remains in AF. Continues to have mild point chest pain tenderness.  Inpatient Medications    Scheduled Meds: . allopurinol  100 mg Oral Daily  . lisinopril  20 mg Oral Daily  . metoprolol tartrate  25 mg Oral Daily  . pantoprazole  40 mg Oral Daily  . rivaroxaban  20 mg Oral Q supper   Continuous Infusions: . diltiazem (CARDIZEM) infusion 5 mg/hr (01/16/18 1228)   PRN Meds: colchicine   Vital Signs    Vitals:   01/16/18 2300 01/16/18 2351 01/17/18 0353 01/17/18 0400  BP: (!) 91/48   101/70  Pulse:      Resp: 13     Temp:  97.9 F (36.6 C) 98 F (36.7 C)   TempSrc:  Oral Oral   SpO2: 98%   96%  Weight:      Height:        Intake/Output Summary (Last 24 hours) at 01/17/2018 0726 Last data filed at 01/17/2018 0644 Gross per 24 hour  Intake 131.97 ml  Output 1375 ml  Net -1243.03 ml   Filed Weights   01/15/18 1622 01/15/18 2038  Weight: (!) 310 lb (140.6 kg) 285 lb 0.9 oz (129.3 kg)    Physical Exam   General: Well developed, well nourished, NAD Skin: Warm, dry, intact  Head: Normocephalic, atraumatic, clear, moist mucus membranes. Neck: Negative for carotid bruits. No JVD Lungs:Clear to ausculation bilaterally. No wheezes, rales, or rhonchi. Breathing is unlabored Cardiovascular: Irregularly irregular with S1 S2. No murmurs, rubs or gallops Abdomen: Soft, non-tender, non-distended with normoactive bowel sounds. No obvious abdominal masses. MSK: Strength and tone appear normal for age. 5/5 in all extremities Extremities: No edema. No clubbing or cyanosis. DP/PT pulses 2+ bilaterally Neuro: Alert and oriented. No focal deficits. No facial asymmetry. MAE spontaneously. Psych: Responds to questions appropriately with normal affect.    Labs    Chemistry Recent  Labs  Lab 01/15/18 1525  NA 141  K 4.0  CL 105  CO2 24  GLUCOSE 105*  BUN 23*  CREATININE 1.40*  CALCIUM 8.8*  GFRNONAA >60  GFRAA >60  ANIONGAP 12     Hematology Recent Labs  Lab 01/15/18 1525  WBC 6.5  RBC 5.14  HGB 14.6  HCT 42.1  MCV 81.9  MCH 28.4  MCHC 34.7  RDW 16.7*  PLT 251    Cardiac Enzymes Recent Labs  Lab 01/15/18 1525 01/16/18 0916  TROPONINI <0.03 <0.03   No results for input(s): TROPIPOC in the last 168 hours.   BNP Recent Labs  Lab 01/15/18 1525  BNP 101.7*     DDimer  Recent Labs  Lab 01/15/18 1525  DDIMER <0.27     Radiology    Dg Chest 2 View  Result Date: 01/15/2018 CLINICAL DATA:  Atrial fibrillation EXAM: CHEST - 2 VIEW COMPARISON:  April 10, 2016 FINDINGS: There is no edema or consolidation. The heart size and pulmonary vascularity are normal. No adenopathy. No bone lesions. IMPRESSION: No edema or consolidation. Electronically Signed   By: Lowella Grip III M.D.   On: 01/15/2018 15:50   Telemetry    01/17/18 Atrial fibrillation with HR 110 - Personally Reviewed  ECG    No new tracings as of 01/17/2018- Personally Reviewed  Cardiac Studies   ECHO: Echocardiogram 10/19/2015: Study Conclusions  -  Left ventricle: The cavity size was normal. There was mild focal basal hypertrophy of the septum. Systolic function was normal. The estimated ejection fraction was in the range of 60% to 65%. Wall motion was normal; there were no regional wall motion abnormalities. - Mitral valve: Calcified annulus.  Echocardiogram 01/16/2018: Study Conclusions  - Left ventricle: The cavity size was normal. There was moderate   focal basal hypertrophy. Systolic function was normal. The   estimated ejection fraction was in the range of 60% to 65%. Wall   motion was normal; there were no regional wall motion   abnormalities. - Mitral valve: Calcified annulus. There was trivial regurgitation. - Tricuspid valve: There was  trivial regurgitation.  Patient Profile     42 y.o. male with a hx of atrial fibrillation noncompliant with Xarelto, hypertension, obesity and OSA on CPAP who is being seen today for the evaluation of atrial fibrillation with RVR at the request of Dr. Algis Liming.  Assessment & Plan    1.  Atrial fibrillation with rapid ventricular response: -Currently remains on diltiazem gtt, awaiting TEE/DCCV scheduled for Friday, 01/18/2018 -Heart rate moderately controlled, 80's to low 100's -Continue metoprolol 25 mg daily, Xarelto 20 mg daily -Echocardiogram with normal LVEF at 60 to 65% and no WMA -CHA2DS2VASc =1 (HTN)  2.  Chest pain: -Continues to have mild point chest discomfort, non radiating with no associated symptoms  -Troponin, <0.03, <0.03 -EKG with atrial fibrillation with RVR, no acute ischemic ST-T wave abnormalities noted -Likely related to elevated heart rate upon admission -Previous negative nuclear study, no further ischemic evaluation at this time  3.  Hypertension: -Labile, 101/70, 91/48, 143/88, 131/81 -Continue lisinopril, metoprolol, diltiazem -Asymptomatic  4.  History of bradycardia: -Monitor heart rate closely, history of sinus bradycardia -Cardiology notified of one 2.3 second pause approximately 4:38 PM yesterday afternoon -Patient asymptomatic  5.  OSA: -Stable, reports compliance with CPAP  6.  HLD: -Lipid panel, LDL-122 on 01/16/2018 -We will plan for statin at discharge for further risk reduction   Signed, Kathyrn Drown NP-C [place] Pager: [phone] 01/17/2018, 7:26 AM     For questions or updates, please contact   Please consult [email or web address] for contact info under Cardiology/STEMI.  As above, patient seen and examined.  He continues with fatigue and mild dyspnea.  No significant chest pain this morning.  He remains in atrial fibrillation.  Continue Cardizem and metoprolol for rate control.  Continue xarelto.  Given that he remains symptomatic  despite rate control plan is for TEE guided cardioversion tomorrow.  We will likely add an antiarrhythmic afterwards to help maintain sinus rhythm.  Best option may be flecainide.  Previous nuclear study showed no ischemia. Kirk Ruths, MD

## 2018-01-17 NOTE — Progress Notes (Signed)
PROGRESS NOTE   Samuel Flynn  RSW:[id number]    DOB: [birth year]    DOA: 01/15/2018  PCP: Marcial Pacas, DO   I have briefly reviewed patients previous medical records in [facility].  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  He was noted to be in A. fib with RVR.  Admitted to stepdown.  Cardiology consulted and plan TEE and cardioversion on 01/18/2018.   Assessment & Plan:   Active Problems:   Atrial fibrillation, rapid ([place])   PAF with RVR: Reports compliance with metoprolol but not Xarelto.  Admitted to stepdown.  Started on IV Cardizem drip and rate currently controlled in the 80s-9s on Cardizem 5 mg/h.  Had a mild pause of 2.3 seconds on 5/15 at 4:38 PM and an 11 beat A. fib with aberrancy versus NSVT on 11/15 at 12:24 PM.  Continued home dose of metoprolol 25 mg daily.  Xarelto resumed and compliance extensively counseled to patient and spouse at bedside.  His symptoms on admission were likely due to RVR.  Cardiology input appreciated and plan TEE guided cardioversion on 01/18/2018.  TSH normal.  TTE 01/16/2018: LVEF 60-65%.  Patient remains symptomatic with palpitations and dyspnea despite rate control.  Hence cardiology plans cardioversion and will likely need an anti-arrhythmic agent thereafter to maintain him in sinus rhythm.  Essential hypertension: Mildly uncontrolled.  Continue lisinopril, metoprolol and IV Cardizem drip.  Amlodipine currently on hold to avoid hypotension.  Stable  OSA: Continue nightly CPAP.  Stage III chronic kidney disease: Creatinine stable and at baseline.  Atypical chest pain/noncardiac chest pain: Likely musculoskeletal in origin.   Nonexertional.  Previous stress test 11/11/2015 was negative.  Currently resolved/stable.  Cardiology seen and do not plan to pursue ischemia evaluation.  TTE without wall motion abnormalities.  Right lower extremity swelling/tingling and numbness: Swelling apparently is chronic.  Right lower extremity venous Doppler negative for DVT.  No focal deficits.  Outpatient follow-up.  Tobacco abuse: Cessation counseled.  Patient reports that he smokes up to 10 cigars a day.  Obesity/Body mass index is 36.6 kg/m.    DVT prophylaxis: Xarelto resumed Code Status: Full Family Communication: Discussed in detail with patient's spouse at bedside, updated care and answered questions. Disposition: Admitted to stepdown, continue.  DC home pending clinical improvement and further interventions by cardiology, possibly 01/19/2018.   Consultants:  Cardiology  Procedures:  CPAP.  Antimicrobials:  None   Subjective: Ongoing palpitations and dyspnea despite rate control.  Denies any other complaints.  As per RN, rate controlled on Cardizem 5 mg/h and at times heart rate drops into the 60s while asleep.  ROS: As above.  Objective:  Vitals:   01/17/18 0353 01/17/18 0400 01/17/18 0800 01/17/18 1215  BP:  101/70    Pulse:      Resp:      Temp: 98 F (36.7 C)  97.6 F (36.4 C) 97.8 F (36.6 C)  TempSrc: Oral  Oral Oral  SpO2:  96%    Weight:      Height:        Examination:  General exam: Pleasant young male, moderately built and obese, lying comfortably propped up in bed. Respiratory system: Clear to auscultation. Respiratory  effort normal.  Stable. Cardiovascular system: S1 & S2 heard, RRR. No JVD, murmurs, rubs, gallops or clicks. No pedal edema.  Telemetry personally reviewed and findings as noted above. Gastrointestinal system: Abdomen is nondistended, soft and nontender. No organomegaly or masses felt. Normal bowel sounds heard.  Stable. Central nervous system: Alert and oriented. No  focal neurological deficits.  Stable. Extremities: Symmetric 5 x 5 power. Skin: No rashes, lesions or ulcers Psychiatry: Judgement and insight appear normal. Mood & affect appropriate.     Data Reviewed: I have personally reviewed following labs and imaging studies  CBC: Recent Labs  Lab 01/15/18 1525  WBC 6.5  NEUTROABS 4.4  HGB 14.6  HCT 42.1  MCV 81.9  PLT 867   Basic Metabolic Panel: Recent Labs  Lab 01/15/18 1525  NA 141  K 4.0  CL 105  CO2 24  GLUCOSE 105*  BUN 23*  CREATININE 1.40*  CALCIUM 8.8*  MG 2.2   Cardiac Enzymes: Recent Labs  Lab 01/15/18 1525 01/16/18 0916  TROPONINI <0.03 <0.03     Recent Results (from the past 240 hour(s))  MRSA PCR Screening     Status: None   Collection Time: 01/15/18  8:43 PM  Result Value Ref Range Status   MRSA by PCR NEGATIVE NEGATIVE Final    Comment:        The GeneXpert MRSA Assay (FDA approved for NASAL specimens only), is one component of a comprehensive MRSA colonization surveillance program. It is not intended to diagnose MRSA infection nor to guide or monitor treatment for MRSA infections. Performed at [facility], [place] [address] [place], [place] [address]          Radiology Studies: Dg Chest 2 View  Result Date: 01/15/2018 CLINICAL DATA:  Atrial fibrillation EXAM: CHEST - 2 VIEW COMPARISON:  April 10, 2016 FINDINGS: There is no edema or consolidation. The heart size and pulmonary vascularity are normal. No adenopathy. No bone lesions. IMPRESSION: No edema or consolidation. Electronically Signed   By: Lowella Grip III M.D.   On: 01/15/2018 15:50        Scheduled Meds: . allopurinol  100 mg Oral Daily  . lisinopril  20 mg Oral Daily  . metoprolol tartrate  25 mg Oral Daily  . pantoprazole  40 mg Oral Daily  . rivaroxaban  20 mg Oral Q supper   Continuous Infusions: . diltiazem (CARDIZEM) infusion 5 mg/hr (01/16/18 1228)     LOS: 2 days     Vernell Leep, MD, FACP, [facility]. [facility] Pager [phone]  If 7PM-7AM, please contact night-coverage [email or web address] Password [email or web address] 01/17/2018, 1:37 PM

## 2018-01-17 NOTE — Plan of Care (Signed)
  Problem: Clinical Measurements: Goal: Respiratory complications will improve Outcome: Progressing   Problem: Activity: Goal: Risk for activity intolerance will decrease Outcome: Progressing   Problem: Nutrition: Goal: Adequate nutrition will be maintained Outcome: Progressing   Problem: Coping: Goal: Level of anxiety will decrease Outcome: Progressing   Problem: Elimination: Goal: Will not experience complications related to bowel motility Outcome: Progressing Goal: Will not experience complications related to urinary retention Outcome: Progressing   Problem: Pain Managment: Goal: General experience of comfort will improve Outcome: Progressing   Problem: Safety: Goal: Ability to remain free from injury will improve Outcome: Progressing   Problem: Skin Integrity: Goal: Risk for impaired skin integrity will decrease Outcome: Progressing   

## 2018-01-18 ENCOUNTER — Inpatient Hospital Stay (HOSPITAL_COMMUNITY): Payer: PRIVATE HEALTH INSURANCE

## 2018-01-18 ENCOUNTER — Inpatient Hospital Stay (HOSPITAL_COMMUNITY): Payer: PRIVATE HEALTH INSURANCE | Admitting: Anesthesiology

## 2018-01-18 ENCOUNTER — Encounter (HOSPITAL_COMMUNITY): Payer: Self-pay | Admitting: *Deleted

## 2018-01-18 ENCOUNTER — Encounter (HOSPITAL_COMMUNITY)
Admission: EM | Disposition: A | Discharge status: Home / Self Care | Payer: Self-pay | Source: Home / Self Care | Attending: Internal Medicine

## 2018-01-18 DIAGNOSIS — I4891 Unspecified atrial fibrillation: Secondary | ICD-10-CM

## 2018-01-18 DIAGNOSIS — M109 Gout, unspecified: Secondary | ICD-10-CM

## 2018-01-18 HISTORY — PX: CARDIOVERSION: SHX1299

## 2018-01-18 HISTORY — PX: TEE WITHOUT CARDIOVERSION: SHX5443

## 2018-01-18 LAB — CBC WITH DIFFERENTIAL/PLATELET
Basophils Absolute: 0 10*3/uL (ref 0.0–0.1)
Basophils Relative: 1 %
EOS PCT: 2 %
Eosinophils Absolute: 0.2 10*3/uL (ref 0.0–0.7)
HEMATOCRIT: 45.3 % (ref 39.0–52.0)
Hemoglobin: 15.4 g/dL (ref 13.0–17.0)
LYMPHS ABS: 1.4 10*3/uL (ref 0.7–4.0)
LYMPHS PCT: 21 %
MCH: 28.2 pg (ref 26.0–34.0)
MCHC: 34 g/dL (ref 30.0–36.0)
MCV: 82.8 fL (ref 78.0–100.0)
MONO ABS: 0.4 10*3/uL (ref 0.1–1.0)
MONOS PCT: 7 %
NEUTROS ABS: 4.6 10*3/uL (ref 1.7–7.7)
Neutrophils Relative %: 69 %
PLATELETS: 223 10*3/uL (ref 150–400)
RBC: 5.47 MIL/uL (ref 4.22–5.81)
RDW: 16.8 % — AB (ref 11.5–15.5)
WBC: 6.6 10*3/uL (ref 4.0–10.5)

## 2018-01-18 LAB — PROTIME-INR
INR: 1.63
Prothrombin Time: 19.2 seconds — ABNORMAL HIGH (ref 11.4–15.2)

## 2018-01-18 LAB — BASIC METABOLIC PANEL
Anion gap: 13 (ref 5–15)
BUN: 15 mg/dL (ref 6–20)
CHLORIDE: 107 mmol/L (ref 101–111)
CO2: 21 mmol/L — AB (ref 22–32)
CREATININE: 1.31 mg/dL — AB (ref 0.61–1.24)
Calcium: 8.5 mg/dL — ABNORMAL LOW (ref 8.9–10.3)
GFR calc non Af Amer: >60 mL/min (ref >60)
GLUCOSE: 110 mg/dL — AB (ref 65–99)
Potassium: 4.1 mmol/L (ref 3.5–5.1)
Sodium: 141 mmol/L (ref 135–145)

## 2018-01-18 LAB — APTT: APTT: 38 s — AB (ref 24–36)

## 2018-01-18 LAB — MAGNESIUM: Magnesium: 2.1 mg/dL (ref 1.7–2.4)

## 2018-01-18 SURGERY — ECHOCARDIOGRAM, TRANSESOPHAGEAL
Anesthesia: Monitor Anesthesia Care

## 2018-01-18 MED ORDER — PROPOFOL 10 MG/ML IV BOLUS
INTRAVENOUS | Status: DC | PRN
  Administered 2018-01-18 (×2): 30 mg via INTRAVENOUS
  Administered 2018-01-18: 100 mg via INTRAVENOUS
  Administered 2018-01-18: 20 mg via INTRAVENOUS

## 2018-01-18 MED ORDER — HYDRALAZINE HCL 20 MG/ML IJ SOLN
5.0000 mg | Freq: Four times a day (QID) | INTRAMUSCULAR | Status: DC | PRN
  Administered 2018-01-18: 5 mg via INTRAVENOUS
  Filled 2018-01-18: qty 1

## 2018-01-18 MED ORDER — LIDOCAINE HCL (CARDIAC) PF 100 MG/5ML IV SOSY
PREFILLED_SYRINGE | INTRAVENOUS | Status: DC | PRN
  Administered 2018-01-18: 100 mg via INTRAVENOUS

## 2018-01-18 MED ORDER — HYDROCODONE-ACETAMINOPHEN 5-325 MG PO TABS
1.0000 | ORAL_TABLET | Freq: Four times a day (QID) | ORAL | Status: DC | PRN
  Administered 2018-01-18 – 2018-01-19 (×2): 2 via ORAL
  Filled 2018-01-18 (×2): qty 2

## 2018-01-18 MED ORDER — PROPOFOL 500 MG/50ML IV EMUL
INTRAVENOUS | Status: DC | PRN
  Administered 2018-01-18: 120 ug/kg/min via INTRAVENOUS

## 2018-01-18 MED ORDER — PHENYLEPHRINE HCL 10 MG/ML IJ SOLN
INTRAMUSCULAR | Status: DC | PRN
  Administered 2018-01-18: 80 ug via INTRAVENOUS

## 2018-01-18 MED ORDER — MORPHINE SULFATE (PF) 4 MG/ML IV SOLN
2.0000 mg | INTRAVENOUS | Status: DC | PRN
  Administered 2018-01-18 – 2018-01-19 (×4): 2 mg via INTRAVENOUS
  Filled 2018-01-18 (×4): qty 1

## 2018-01-18 MED ORDER — SODIUM CHLORIDE 0.9% FLUSH
3.0000 mL | Freq: Two times a day (BID) | INTRAVENOUS | Status: DC
  Administered 2018-01-18: 3 mL via INTRAVENOUS

## 2018-01-18 MED ORDER — SODIUM CHLORIDE 0.9 % IV SOLN: 250.0000 mL | INTRAVENOUS | Status: DC

## 2018-01-18 MED ORDER — ACETAMINOPHEN 325 MG PO TABS
650.0000 mg | ORAL_TABLET | Freq: Four times a day (QID) | ORAL | Status: DC | PRN
  Administered 2018-01-18 (×2): 650 mg via ORAL
  Filled 2018-01-18 (×2): qty 2

## 2018-01-18 MED ORDER — SODIUM CHLORIDE 0.9% FLUSH: 3.0000 mL | INTRAVENOUS | Status: DC | PRN

## 2018-01-18 MED ORDER — COLCHICINE 0.6 MG PO TABS
0.6000 mg | ORAL_TABLET | Freq: Two times a day (BID) | ORAL | Status: DC
  Administered 2018-01-18 – 2018-01-19 (×2): 0.6 mg via ORAL
  Filled 2018-01-18 (×2): qty 1

## 2018-01-18 MED ORDER — HYDROCODONE-ACETAMINOPHEN 5-325 MG PO TABS
1.0000 | ORAL_TABLET | Freq: Four times a day (QID) | ORAL | Status: DC | PRN
  Administered 2018-01-18: 2 via ORAL
  Filled 2018-01-18: qty 2

## 2018-01-18 MED ORDER — SODIUM CHLORIDE 0.9 % IV SOLN
INTRAVENOUS | Status: DC
  Administered 2018-01-18: 13:00:00 via INTRAVENOUS

## 2018-01-18 NOTE — Progress Notes (Signed)
Progress Note  Patient Name: Samuel Flynn Date of Encounter: 01/18/2018  Primary Cardiologist: New-Dr. Stanford Breed  Subjective   Fatigue and mild chest heaviness; mild dyspnea  Inpatient Medications    Scheduled Meds: . allopurinol  100 mg Oral Daily  . colchicine  0.6 mg Oral BID  . lisinopril  20 mg Oral Daily  . metoprolol tartrate  25 mg Oral Daily  . pantoprazole  40 mg Oral Daily  . rivaroxaban  20 mg Oral Q supper   Continuous Infusions: . diltiazem (CARDIZEM) infusion 5 mg/hr (01/18/18 1033)   PRN Meds: acetaminophen, hydrALAZINE, HYDROcodone-acetaminophen, morphine injection   Vital Signs    Vitals:   01/18/18 0400 01/18/18 0424 01/18/18 0800 01/18/18 1051  BP: (!) 131/103 (!) 136/91 (!) 167/119 (!) 188/116  Pulse:      Resp: (!) 24  (!) 21   Temp:   97.6 F (36.4 C)   TempSrc:   Oral   SpO2: 97% 98% 92%   Weight:      Height:        Intake/Output Summary (Last 24 hours) at 01/18/2018 1134 Last data filed at 01/18/2018 0126 Gross per 24 hour  Intake 0 ml  Output 300 ml  Net -300 ml   Filed Weights   01/15/18 1622 01/15/18 2038  Weight: (!) 310 lb (140.6 kg) 285 lb 0.9 oz (129.3 kg)    Physical Exam   General: WD/WN NAD Head: Normal Neck: Supple Lungs:CTA Cardiovascular: Irregular Abdomen: Soft, NT/ND Extremities: No edema Neuro: Grossly intact    Labs    Chemistry Recent Labs  Lab 01/15/18 1525 01/18/18 0337  NA 141 141  K 4.0 4.1  CL 105 107  CO2 24 21*  GLUCOSE 105* 110*  BUN 23* 15  CREATININE 1.40* 1.31*  CALCIUM 8.8* 8.5*  GFRNONAA >60 >60  GFRAA >60 >60  ANIONGAP 12 13     Hematology Recent Labs  Lab 01/15/18 1525 01/18/18 0337  WBC 6.5 6.6  RBC 5.14 5.47  HGB 14.6 15.4  HCT 42.1 45.3  MCV 81.9 82.8  MCH 28.4 28.2  MCHC 34.7 34.0  RDW 16.7* 16.8*  PLT 251 223    Cardiac Enzymes Recent Labs  Lab 01/15/18 1525 01/16/18 0916  TROPONINI <0.03 <0.03    BNP Recent Labs  Lab 01/15/18 1525    BNP 101.7*     DDimer  Recent Labs  Lab 01/15/18 1525  DDIMER <0.27     Telemetry    01/17/18 Atrial fibrillation with mildly elevated rate - Personally Reviewed   Cardiac Studies   ECHO: Echocardiogram 10/19/2015: Study Conclusions  - Left ventricle: The cavity size was normal. There was mild focal basal hypertrophy of the septum. Systolic function was normal. The estimated ejection fraction was in the range of 60% to 65%. Wall motion was normal; there were no regional wall motion abnormalities. - Mitral valve: Calcified annulus.  Echocardiogram 01/16/2018: Study Conclusions  - Left ventricle: The cavity size was normal. There was moderate   focal basal hypertrophy. Systolic function was normal. The   estimated ejection fraction was in the range of 60% to 65%. Wall   motion was normal; there were no regional wall motion   abnormalities. - Mitral valve: Calcified annulus. There was trivial regurgitation. - Tricuspid valve: There was trivial regurgitation.  Patient Profile     42 y.o. male with a hx of atrial fibrillation noncompliant with Xarelto, hypertension, obesity and OSA on CPAP who is being  seen for the evaluation of atrial fibrillation with RVR at the request of Dr. Algis Liming.  Assessment & Plan    1.  Atrial fibrillation with rapid ventricular response: Patient remains in atrial fibrillation today.  Continue Cardizem and metoprolol.  Continue Xarelto.  LV function is normal.  For TEE guided cardioversion today.  We will consider adding flecainide following procedure to help maintain sinus rhythm as he has had increased frequency of symptoms.  Previous nuclear study showed no ischemia.  2.  Chest pain: Mild chest tightness.  All enzymes negative.  No plans for further ischemia evaluation.  Previous nuclear study negative.  3.  Hypertension: Blood pressure is elevated but was controlled yesterday.  Continue present medications and follow.  4.   OSA: Continue CPAP.  5.  HLD: Follow-up with primary care after discharge.   Signed, Kirk Ruths MD [facility] 01/18/2018, 11:34 AM     For questions or updates, please contact   Please consult [email or web address] for contact info under Cardiology/STEMI.

## 2018-01-18 NOTE — Progress Notes (Signed)
Paged doctor, patient requesting pain medication. Awaiting response/new orders.

## 2018-01-18 NOTE — Anesthesia Postprocedure Evaluation (Signed)
Anesthesia Post Note  Patient: Hendrix Yurkovich [facility]  Procedure(s) Performed: TRANSESOPHAGEAL ECHOCARDIOGRAM (TEE) (N/A ) CARDIOVERSION (N/A )     Patient location during evaluation: PACU Anesthesia Type: MAC Level of consciousness: awake and alert Pain management: pain level controlled Vital Signs Assessment: post-procedure vital signs reviewed and stable Respiratory status: spontaneous breathing, nonlabored ventilation and respiratory function stable Cardiovascular status: stable and blood pressure returned to baseline Postop Assessment: no apparent nausea or vomiting Anesthetic complications: no    Last Vitals:  Vitals:   01/18/18 1340 01/18/18 1350  BP: (!) 154/78 (!) 143/79  Pulse: 62 60  Resp: 20 19  Temp:    SpO2: 99% 99%    Last Pain:  Vitals:   01/18/18 1350  TempSrc:   PainSc: 0-No pain                 Lynda Rainwater

## 2018-01-18 NOTE — Transfer of Care (Signed)
Immediate Anesthesia Transfer of Care Note  Patient: Samuel Flynn [facility]  Procedure(s) Performed: TRANSESOPHAGEAL ECHOCARDIOGRAM (TEE) (N/A ) CARDIOVERSION (N/A )  Patient Location: PACU  Anesthesia Type:MAC  Level of Consciousness: awake, alert , oriented and patient cooperative  Airway & Oxygen Therapy: Patient Spontanous Breathing and Patient connected to nasal cannula oxygen  Post-op Assessment: Report given to RN and Post -op Vital signs reviewed and stable  Post vital signs: Reviewed and stable  Last Vitals:  Vitals Value Taken Time  BP 101/45 01/18/2018  1:19 PM  Temp 36.6 C 01/18/2018  1:19 PM  Pulse 63 01/18/2018  1:21 PM  Resp 22 01/18/2018  1:21 PM  SpO2 97 % 01/18/2018  1:21 PM  Vitals shown include unvalidated device data.  Last Pain:  Vitals:   01/18/18 1319  TempSrc: Oral  PainSc: 0-No pain      Patients Stated Pain Goal: 3 (17/51/02 5852)  Complications: No apparent anesthesia complications

## 2018-01-18 NOTE — CV Procedure (Signed)
Procedure: TEE  Sedation: Per anesthesiology.  Indication: Atrial fibrillation.   Findings: Please see echo section for full report.  Normal LV size and systolic function, EF 13-08%.  Normal wall thickness and wall motion.   Normal RV size and systolic function.  Mild left atrial enlargement, no LA appendage thrombus.  Normal right atrium.  Trivial TR.  Trivial mitral regurgitation.  Mildly calcified aortic valve, trileaflet, with no significant regurgitation or stenosis.  No ASD/PFO by color doppler. Normal caliber thoracic aorta with no significant plaque.   Impression: No LA appendage thrombus.   Samuel Flynn 01/18/2018 1:16 PM

## 2018-01-18 NOTE — Procedures (Signed)
Electrical Cardioversion Procedure Note ARSLAN KIER [id number] 07-Dec-1975  Procedure: Electrical Cardioversion Indications:  Atrial Fibrillation  Procedure Details Consent: Risks of procedure as well as the alternatives and risks of each were explained to the (patient/caregiver).  Consent for procedure obtained. Time Out: Verified patient identification, verified procedure, site/side was marked, verified correct patient position, special equipment/implants available, medications/allergies/relevent history reviewed, required imaging and test results available.  Performed  Patient placed on cardiac monitor, pulse oximetry, supplemental oxygen as necessary.  Sedation given: Per anesthesiology Pacer pads placed anterior and posterior chest.  Cardioverted 1 time(s).  Cardioverted at [place].  Evaluation Findings: Post procedure EKG shows: NSR Complications: None Patient did tolerate procedure well.   Loralie Champagne 01/18/2018, 1:16 PM

## 2018-01-18 NOTE — Anesthesia Preprocedure Evaluation (Signed)
Anesthesia Evaluation  Patient identified by MRN, date of birth, ID band Patient awake    Reviewed: Allergy & Precautions, NPO status , Patient's Chart, lab work & pertinent test results  Airway Mallampati: III  TM Distance: >3 FB Neck ROM: Full    Dental no notable dental hx.    Pulmonary Current Smoker, former smoker,    Pulmonary exam normal breath sounds clear to auscultation       Cardiovascular hypertension, Normal cardiovascular exam+ dysrhythmias  Rhythm:Regular Rate:Normal     Neuro/Psych negative neurological ROS     GI/Hepatic Neg liver ROS, PUD,   Endo/Other  Morbid obesity  Renal/GU negative Renal ROS     Musculoskeletal   Abdominal   Peds  Hematology negative hematology ROS (+)   Anesthesia Other Findings   Reproductive/Obstetrics                             Anesthesia Physical  Anesthesia Plan  ASA: II  Anesthesia Plan: MAC   Post-op Pain Management:    Induction: Intravenous  PONV Risk Score and Plan: Treatment may vary due to age or medical condition  Airway Management Planned: Nasal Cannula  Additional Equipment:   Intra-op Plan:   Post-operative Plan:   Informed Consent: I have reviewed the patients History and Physical, chart, labs and discussed the procedure including the risks, benefits and alternatives for the proposed anesthesia with the patient or authorized representative who has indicated his/her understanding and acceptance.   Dental advisory given  Plan Discussed with: CRNA  Anesthesia Plan Comments:         Anesthesia Quick Evaluation

## 2018-01-18 NOTE — Progress Notes (Signed)
  Echocardiogram Echocardiogram Transesophageal has been performed.  Samuel Flynn G Samuel Flynn 01/18/2018, 2:46 PM

## 2018-01-18 NOTE — Progress Notes (Signed)
PROGRESS NOTE   Samuel Flynn  GYI:[id number]    DOB: 1976/03/15    DOA: 01/15/2018  PCP: Marcial Pacas, DO   I have briefly reviewed patients previous medical records in [facility]  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  He was noted to be in A. fib with RVR.  Admitted to stepdown.  Cardiology consulted and plan TEE and cardioversion on 01/18/2018.  Left wrist and great toe pain possibly from acute gout 01/18/2018.   Assessment & Plan:   Active Problems:   Atrial fibrillation, rapid ([place])   PAF with RVR: Reports compliance with metoprolol but not Xarelto.  Admitted to stepdown.  Started on IV Cardizem drip and rate currently controlled in the 80s-9s on Cardizem 5 mg/h.  Had a mild pause of 2.3 seconds on 5/15 at 4:38 PM and an 11 beat A. fib with aberrancy versus NSVT on 11/15 at 12:24 PM.  Continued home dose of metoprolol 25 mg daily.  Xarelto resumed and compliance extensively counseled to patient and spouse at bedside.  His symptoms on admission were likely due to RVR.  Cardiology input appreciated and plan TEE guided cardioversion on 01/18/2018.  TSH normal.  TTE 01/16/2018: LVEF 60-65%.  Patient remains symptomatic with palpitations and dyspnea despite rate control.  Hence cardiology plans cardioversion and will likely need an anti-arrhythmic agent thereafter to maintain him in sinus rhythm.  A. fib with RVR up to 110s this morning.  Awaiting TEE cardioversion at [facility].  Essential hypertension: Continue lisinopril, metoprolol and IV Cardizem drip.  Amlodipine currently on hold to avoid hypotension.  Periodically uncontrolled, now likely related to pain.  Try to control pain,  continue current regimen and monitor closely.  Added PRN IV hydralazine. If persistent elevation, may consider resuming home dose of amlodipine.  OSA: Continue nightly CPAP.  Stage III chronic kidney disease: Creatinine improved and stable at 1.3 today.  Atypical chest pain/noncardiac chest pain: Likely musculoskeletal in origin.  Nonexertional.  Previous stress test 11/11/2015 was negative.  Currently resolved/stable.  Cardiology seen and do not plan to pursue ischemia evaluation.  TTE without wall motion abnormalities.  Had atypical chest pain again this morning.  Right lower extremity swelling/tingling and numbness: Swelling apparently is chronic.  Right lower extremity venous Doppler negative for DVT.  No focal deficits.  Outpatient follow-up.  Tobacco abuse: Cessation counseled.  Patient reports that he smokes up to 10 cigars a day.  Obesity/Body mass index is 36.6 kg/m.   Acute gout (left wrist and great toe): Temporarily change colchicine to 0.6 mg twice daily and then can change back to as needed.  Continue prior home dose of allopurinol.    DVT prophylaxis: Xarelto resumed Code Status: Full Family Communication: None at bedside this morning. Disposition: Admitted to stepdown, continue.  DC home pending clinical improvement and further interventions by cardiology, possibly 01/19/2018.   Consultants:  Cardiology  Procedures:  CPAP.  Antimicrobials:  None   Subjective: Reports pain and mild swelling of left wrist and left big toe consistent with prior episodes of gout flare.  Also reports pain across lower anterior chest wall but unable to clearly qualify type of pain.  Reports feeling "worn out".  As  per RN, no acute issues reported.  ROS: As above.  Objective:  Vitals:   01/18/18 0354 01/18/18 0400 01/18/18 0424 01/18/18 0800  BP:  (!) 131/103 (!) 136/91 (!) 167/119  Pulse:      Resp:  (!) 24  (!) 21  Temp: 98.1 F (36.7 C)   97.6 F (36.4 C)  TempSrc: Oral    Oral  SpO2:  97% 98% 92%  Weight:      Height:        Examination:  General exam: Pleasant young male, moderately built and obese, lying comfortably propped up in bed. Respiratory system: Clear to auscultation. Respiratory effort normal.  Nonreproducible chest pain. Cardiovascular system: S1 & S2 heard, RRR. No JVD, murmurs, rubs, gallops or clicks. No pedal edema.  Telemetry personally reviewed: A. fib with RVR up to 110s. Gastrointestinal system: Abdomen is nondistended, soft and nontender. No organomegaly or masses felt. Normal bowel sounds heard.  Stable Central nervous system: Alert and oriented. No focal neurological deficits.  Stable. Extremities: Symmetric 5 x 5 power.  Left wrist and left great toe MTP joint with mild swelling, increased warmth, mild tenderness and painful range of movements. Skin: No rashes, lesions or ulcers Psychiatry: Judgement and insight appear normal. Mood & affect appropriate.     Data Reviewed: I have personally reviewed following labs and imaging studies  CBC: Recent Labs  Lab 01/15/18 1525 01/18/18 0337  WBC 6.5 6.6  NEUTROABS 4.4 4.6  HGB 14.6 15.4  HCT 42.1 45.3  MCV 81.9 82.8  PLT 251 161   Basic Metabolic Panel: Recent Labs  Lab 01/15/18 1525 01/18/18 0337  NA 141 141  K 4.0 4.1  CL 105 107  CO2 24 21*  GLUCOSE 105* 110*  BUN 23* 15  CREATININE 1.40* 1.31*  CALCIUM 8.8* 8.5*  MG 2.2 2.1   Cardiac Enzymes: Recent Labs  Lab 01/15/18 1525 01/16/18 0916  TROPONINI <0.03 <0.03     Recent Results (from the past 240 hour(s))  MRSA PCR Screening     Status: None   Collection Time: 01/15/18  8:43 PM  Result Value Ref Range Status   MRSA by PCR NEGATIVE NEGATIVE Final    Comment:        The GeneXpert MRSA Assay (FDA approved for NASAL specimens only), is one component of a comprehensive MRSA colonization surveillance program. It is not intended to diagnose MRSA infection nor to guide or monitor treatment for MRSA  infections. Performed at [facility], [place] [address], [place], [place] [address]          Radiology Studies: No results found.      Scheduled Meds: . allopurinol  100 mg Oral Daily  . colchicine  0.6 mg Oral BID  . lisinopril  20 mg Oral Daily  . metoprolol tartrate  25 mg Oral Daily  . pantoprazole  40 mg Oral Daily  . rivaroxaban  20 mg Oral Q supper   Continuous Infusions: . diltiazem (CARDIZEM) infusion 5 mg/hr (01/17/18 1539)     LOS: 3 days     Vernell Leep, MD, FACP, [facility]. [facility] Pager [phone]  If 7PM-7AM, please contact night-coverage [email or web address] Password [facility] 01/18/2018, 10:21 AM

## 2018-01-19 ENCOUNTER — Other Ambulatory Visit: Payer: Self-pay | Admitting: Physician Assistant

## 2018-01-19 ENCOUNTER — Other Ambulatory Visit: Payer: Self-pay

## 2018-01-19 DIAGNOSIS — R0789 Other chest pain: Principal | ICD-10-CM

## 2018-01-19 DIAGNOSIS — R079 Chest pain, unspecified: Secondary | ICD-10-CM

## 2018-01-19 MED ORDER — FLECAINIDE ACETATE 50 MG PO TABS
50.0000 mg | ORAL_TABLET | Freq: Two times a day (BID) | ORAL | Status: DC
  Administered 2018-01-19: 50 mg via ORAL
  Filled 2018-01-19: qty 1

## 2018-01-19 MED ORDER — FLECAINIDE ACETATE 50 MG PO TABS: 50.0000 mg | ORAL_TABLET | Freq: Two times a day (BID) | ORAL | 0 refills | Status: DC

## 2018-01-19 MED ORDER — RIVAROXABAN 20 MG PO TABS: 20.0000 mg | ORAL_TABLET | Freq: Every day | ORAL | 0 refills | Status: DC

## 2018-01-19 MED ORDER — METOPROLOL SUCCINATE ER 25 MG PO TB24
50.0000 mg | ORAL_TABLET | Freq: Every day | ORAL | Status: DC
  Administered 2018-01-19: 50 mg via ORAL
  Filled 2018-01-19: qty 2

## 2018-01-19 MED ORDER — METOPROLOL SUCCINATE ER 50 MG PO TB24: 50.0000 mg | ORAL_TABLET | Freq: Every day | ORAL | 0 refills | Status: DC

## 2018-01-19 NOTE — Progress Notes (Signed)
Progress Note  Patient Name: Samuel Flynn Date of Encounter: 01/19/2018  Primary Cardiologist: New-Dr. Stanford Breed  Subjective   No chest pain or dyspnea  Inpatient Medications    Scheduled Meds: . allopurinol  100 mg Oral Daily  . colchicine  0.6 mg Oral BID  . lisinopril  20 mg Oral Daily  . metoprolol tartrate  25 mg Oral Daily  . pantoprazole  40 mg Oral Daily  . rivaroxaban  20 mg Oral Q supper  . sodium chloride flush  3 mL Intravenous Q12H   Continuous Infusions: . sodium chloride    . diltiazem (CARDIZEM) infusion Stopped (01/18/18 1326)   PRN Meds: acetaminophen, hydrALAZINE, HYDROcodone-acetaminophen, morphine injection, sodium chloride flush   Vital Signs    Vitals:   01/18/18 2000 01/18/18 2100 01/19/18 0012 01/19/18 0400  BP:  110/82    Pulse:      Resp: 15 17    Temp:   98.7 F (37.1 C) 98.2 F (36.8 C)  TempSrc:   Axillary Oral  SpO2: 96% 96%    Weight:      Height:        Intake/Output Summary (Last 24 hours) at 01/19/2018 0808 Last data filed at 01/18/2018 2217 Gross per 24 hour  Intake 200 ml  Output 350 ml  Net -150 ml   Filed Weights   01/15/18 1622 01/15/18 2038  Weight: (!) 310 lb (140.6 kg) 285 lb 0.9 oz (129.3 kg)    Physical Exam   General: WD/WN NAD; sitting in bed Head: Norma; normal eyelidsl Neck: Supple; no JVD Lungs:CTA, no wheeze Cardiovascular: Irregular; no murmur Abdomen: Soft, NT/ND, no masses Extremities: No edema Neuro: No focal findings  Labs    Chemistry Recent Labs  Lab 01/15/18 1525 01/18/18 0337  NA 141 141  K 4.0 4.1  CL 105 107  CO2 24 21*  GLUCOSE 105* 110*  BUN 23* 15  CREATININE 1.40* 1.31*  CALCIUM 8.8* 8.5*  GFRNONAA >60 >60  GFRAA >60 >60  ANIONGAP 12 13     Hematology Recent Labs  Lab 01/15/18 1525 01/18/18 0337  WBC 6.5 6.6  RBC 5.14 5.47  HGB 14.6 15.4  HCT 42.1 45.3  MCV 81.9 82.8  MCH 28.4 28.2  MCHC 34.7 34.0  RDW 16.7* 16.8*  PLT 251 223    Cardiac  Enzymes Recent Labs  Lab 01/15/18 1525 01/16/18 0916  TROPONINI <0.03 <0.03    BNP Recent Labs  Lab 01/15/18 1525  BNP 101.7*     DDimer  Recent Labs  Lab 01/15/18 1525  DDIMER <0.27     Telemetry    sinus - Personally Reviewed   Cardiac Studies   ECHO: Echocardiogram 10/19/2015: Study Conclusions  - Left ventricle: The cavity size was normal. There was mild focal basal hypertrophy of the septum. Systolic function was normal. The estimated ejection fraction was in the range of 60% to 65%. Wall motion was normal; there were no regional wall motion abnormalities. - Mitral valve: Calcified annulus.  Echocardiogram 01/16/2018: Study Conclusions  - Left ventricle: The cavity size was normal. There was moderate   focal basal hypertrophy. Systolic function was normal. The   estimated ejection fraction was in the range of 60% to 65%. Wall   motion was normal; there were no regional wall motion   abnormalities. - Mitral valve: Calcified annulus. There was trivial regurgitation. - Tricuspid valve: There was trivial regurgitation.  Patient Profile     42 y.o. male  with a hx of atrial fibrillation noncompliant with Xarelto, hypertension, obesity and OSA on CPAP who is being seen for the evaluation of atrial fibrillation with RVR at the request of Dr. Algis Liming.  Assessment & Plan    1.  Atrial fibrillation with rapid ventricular response: Patient back in sinus rhythm following cardioversion.  I will discontinue IV Cardizem.  Change Lopressor to Toprol 50 mg daily.  Add flecainide 50 mg twice daily to help maintain sinus rhythm.  Continue Xarelto.  Patient will need exercise treadmill in 1 to 2 weeks to rule out exercise-induced ventricular arrhythmias.  Note previous nuclear study showed no ischemia and LV function is normal.   2.  Chest pain: No further chest pain.  Previous nuclear study negative.  No further ischemia evaluation.  3.  Hypertension: Blood  pressure medications as outlined above.  4.  OSA: Continue CPAP.  5.  HLD: Follow-up with primary care after discharge.  Patient can be discharged from a cardiac standpoint.  Transition of care appointment 1 week.  Exercise treadmill 1 to 2 weeks.  Follow-up Dr. Oval Linsey 8 to 12 weeks.  We will sign off.  Please call with questions.  Signed, Kirk Ruths MD [facility] 01/19/2018, 8:08 AM     For questions or updates, please contact   Please consult [email or web address] for contact info under Cardiology/STEMI.

## 2018-01-19 NOTE — Progress Notes (Signed)
Reviewed with patient his discharge instructions about medications and diet.  Printed the patient information on diet with gout.  Instructed patient where to pick up his medications and how important it was to take his medications.  Discharged instructions understood by patient and wife, and patient discharged to home with wife.  Capucine Tryon Roselie Awkward RN

## 2018-01-19 NOTE — Discharge Summary (Signed)
Physician Discharge Summary  Samuel Flynn ZCH:[id number] DOB: 04-May-1976  PCP: Marcial Pacas, DO  Admit date: 01/15/2018 Discharge date: 01/19/2018  Recommendations for Outpatient Follow-up:  1. Dr. Marcial Pacas, PCP in 1 week with repeat labs (CBC & BMP). 2. Dr. Skeet Latch, Cardiology: Office will arrange outpatient transition of care appointment in 1 week, exercise treadmill in 1-2 weeks and MD follow-up in 8 to 12 weeks.  Home Health: None Equipment/Devices: None  Discharge Condition: Improved and stable CODE STATUS: Full Diet recommendation: Heart healthy diet.  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  He was noted to be in A. fib with RVR.  Admitted to stepdown.  Cardiology consulted and s/p TEE and cardioversion on 01/18/2018.  Left wrist and great toe pain possibly from acute gout 01/18/2018, improved.   Assessment & Plan:   PAF with RVR: Reports compliance with metoprolol but not Xarelto.  Admitted to stepdown.  Started on IV Cardizem drip.  Had a mild pause of 2.3 seconds on 5/15 at 4:38 PM and an 11 beat A. fib with aberrancy versus NSVT on 11/15 at 12:24 PM.  Continued home dose of metoprolol 25 mg daily.  Xarelto resumed and compliance extensively counseled to patient and spouse at bedside.  His symptoms on admission were likely due to RVR.  Cardiology consulted and he underwent successful TEE guided cardioversion on 01/18/2018.  TSH normal.  TTE 01/16/2018: LVEF 60-65%.    Clinically improved.  Patient has been in sinus rhythm post cardioversion.  Cardiology has seen him today, discontinued  IV Cardizem, changed metoprolol to Toprol-XL 50 mg daily, added flecainide 50 mg twice daily to help maintain in sinus rhythm, continue Xarelto.  Cardiology plans to do exercise treadmill in 1 to 2 weeks to rule out exercise-induced ventricular arrhythmias.  Cardiology has cleared him for discharge and will arrange outpatient follow-up.  Essential hypertension:  Controlled on metoprolol and lisinopril alone and hence amlodipine discontinued.  Close outpatient follow-up.  OSA: Continue nightly CPAP.  Stage III chronic kidney disease:  Baseline creatinine is not exactly known.  PCP may have better idea from outpatient labs.  Last creatinine in [facility] is from August 2017: 1.23.  Presented with creatinine of 1.55 which is improved to 1.3 which may be his baseline.  Periodic follow-up of BMP as outpatient.  Avoid nephrotoxic's.  Patient counseled to minimize use of NSAIDs and he verbalized understanding.  Atypical chest pain/noncardiac chest pain: Likely musculoskeletal in origin.  Nonexertional.  Previous stress test 11/11/2015 was negative.  Currently resolved/stable.  Cardiology seen and do not plan to pursue ischemia evaluation.  TTE without wall motion abnormalities.    No further chest pain.  Cardiology plans treadmill in the next 1 to 2 weeks.  Right lower extremity swelling/tingling and numbness: Swelling apparently is chronic.  Right lower extremity venous Doppler negative for DVT.  No focal deficits.  Outpatient follow-up.  Tobacco abuse: Cessation counseled.  Patient reports that he smokes up to 10 cigars a day.  Obesity/Body mass index is 36.6 kg/m.   Weight loss recommended.  Acute gout (left wrist and great toe):  Improved with colchicine.  Continue  prior home dose of allopurinol and as needed colchicine.  Low purine diet was counseled.    Consultants:  Cardiology  Procedures:  CPAP. TEE and cardioversion 5/17.     Discharge Instructions  Discharge Instructions     Call MD for:   Complete by:  As directed    Heart racing or palpitations.   Call MD for:  difficulty breathing, headache or visual disturbances   Complete by:  As directed    Call MD for:  extreme fatigue   Complete by:  As directed    Call MD for:  persistant dizziness or light-headedness   Complete by:  As directed    Call MD for:  redness, tenderness, or signs of infection (pain, swelling, redness, odor or green/yellow discharge around incision site)   Complete by:  As directed    Call MD for:  severe uncontrolled pain   Complete by:  As directed    Call MD for:  temperature >100.4   Complete by:  As directed    Diet - low sodium heart healthy   Complete by:  As directed    Increase activity slowly   Complete by:  As directed        Medication List    STOP taking these medications   amLODipine 10 MG tablet Commonly known as:  NORVASC   metoprolol tartrate 25 MG tablet Commonly known as:  LOPRESSOR     TAKE these medications   allopurinol 100 MG tablet Commonly known as:  ZYLOPRIM Take 100 mg by mouth daily. For gout   colchicine 0.6 MG tablet Take 0.6 mg by mouth daily as needed (gout flare).   FISH OIL PO Take 1 tablet by mouth daily.   flecainide 50 MG tablet Commonly known as:  TAMBOCOR Take 1 tablet (50 mg total) by mouth 2 (two) times daily.   ibuprofen 200 MG tablet Commonly known as:  ADVIL,MOTRIN Take 800 mg by mouth every 6 (six) hours as needed for headache or moderate pain.   lisinopril 20 MG tablet Commonly known as:  PRINIVIL,ZESTRIL Take 20 mg by mouth daily. For blood pressure   metoprolol succinate 50 MG 24 hr tablet Commonly known as:  TOPROL-XL Take 1 tablet (50 mg total) by mouth daily. Start taking on:  01/20/2018   pantoprazole 40 MG tablet Commonly known as:  PROTONIX TAKE 1 TABLET (40 MG TOTAL) BY MOUTH DAILY. NEED OV.   rivaroxaban 20 MG Tabs tablet Commonly known as:  XARELTO Take 1 tablet (20 mg total) by mouth daily with  supper.      Follow-up Information    Skeet Latch, MD Follow up.   Specialty:  Cardiology Why:  office will call with time and date of appointment, if you did not heard by Tuesday afternoon please give [place] a call Contact information: [address] [place] [address] [place], [place], DO. Schedule an appointment as soon as possible for a visit in 1 week(s).   Specialty:  Family Medicine Why:  To be seen with repeat labs (CBC & BMP). Contact information: [place] [address] [phone]          No Known Allergies    Procedures/Studies: Dg Chest 2 View  Result Date: 01/15/2018 CLINICAL DATA:  Atrial fibrillation EXAM: CHEST - 2 VIEW COMPARISON:  April 10, 2016 FINDINGS: There is no edema or consolidation. The heart size and pulmonary vascularity are normal. No  adenopathy. No bone lesions. IMPRESSION: No edema or consolidation. Electronically Signed   By: Lowella Grip III M.D.   On: 01/15/2018 15:50      Subjective: Patient interviewed and examined along with spouse at bedside.  Denies complaints.  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  No further chest pain, palpitations, dyspnea or weakness.  No dizziness or lightheadedness reported.  Indicates that his left wrist and left great toe pain have significantly improved.  Denies any other complaints.  Subsequently per RN, threatened to leave AMA if he was not discharged soon enough although he had been informed that he would be discharged around this time.  Discharge Exam:  Vitals:   01/18/18 2100 01/19/18 0012 01/19/18 0400 01/19/18 0800  BP: 110/82   102/60  Pulse:      Resp: 17   18  Temp:  98.7 F (37.1 C) 98.2 F (36.8 C) 98.3 F (36.8 C)  TempSrc:  Axillary Oral Oral  SpO2: 96%   98%  Weight:      Height:        General exam: Pleasant young male, moderately built and obese, lying comfortably  propped up in bed. Respiratory system: Clear to auscultation. Respiratory effort normal. Cardiovascular system: S1 & S2 heard, RRR. No JVD, murmurs, rubs, gallops or clicks. No pedal edema.    Telemetry personally reviewed: Sinus rhythm. Gastrointestinal system: Abdomen is nondistended, soft and nontender. No organomegaly or masses felt. Normal bowel sounds heard.   Central nervous system: Alert and oriented. No focal neurological deficits.   Extremities: Symmetric 5 x 5 power.   Left wrist and left great toe MTP joint improved with minimal swelling, no significant increased warmth or tenderness or painful range of movements. Skin: No rashes, lesions or ulcers Psychiatry: Judgement and insight appear normal. Mood & affect appropriate.       The results of significant diagnostics from this hospitalization (including imaging, microbiology, ancillary and laboratory) are listed below for reference.     Microbiology: Recent Results (from the past 240 hour(s))  MRSA PCR Screening     Status: None   Collection Time: 01/15/18  8:43 PM  Result Value Ref Range Status   MRSA by PCR NEGATIVE NEGATIVE Final    Comment:        The GeneXpert MRSA Assay (FDA approved for NASAL specimens only), is one component of a comprehensive MRSA colonization surveillance program. It is not intended to diagnose MRSA infection nor to guide or monitor treatment for MRSA infections. Performed at [facility], [place] [address], [place], [place] [id number]      Labs: CBC: Recent Labs  Lab 01/15/18 1525 01/18/18 0337  WBC 6.5 6.6  NEUTROABS 4.4 4.6  HGB 14.6 15.4  HCT 42.1 45.3  MCV 81.9 82.8  PLT 251 623   Basic Metabolic Panel: Recent Labs  Lab 01/15/18 1525 01/18/18 0337  NA 141 141  K 4.0 4.1  CL 105 107  CO2 24 21*  GLUCOSE 105* 110*  BUN 23* 15  CREATININE 1.40* 1.31*  CALCIUM 8.8* 8.5*  MG 2.2 2.1   BNP (last 3 results) Recent Labs    01/15/18 1525  BNP 101.7*    Cardiac Enzymes: Recent Labs  Lab 01/15/18 1525 01/16/18 0916  TROPONINI <0.03 <0.03   Urinalysis    Component Value Date/Time   COLORURINE YELLOW 01/15/2018 1929   APPEARANCEUR CLEAR 01/15/2018 1929   LABSPEC 1.014  01/15/2018 [place] 6.0 01/15/2018 [place] NEGATIVE 01/15/2018 [place] NEGATIVE 01/15/2018 1929   HGBUR negative 08/26/2007 [place] 01/15/2018 1929   KETONESUR 5 (A) 01/15/2018 1929   PROTEINUR NEGATIVE 01/15/2018 1929   UROBILINOGEN 1.0 11/06/2014 1352   NITRITE NEGATIVE 01/15/2018 [place] NEGATIVE 01/15/2018 1929   Discussed in detail with patient's spouse at bedside.  Updated care and answered questions.  Advised both of them that patient has to be compliant with all aspects of his medical care including Xarelto and they verbalized understanding.  Also discussed regarding low purine diet.   Time coordinating discharge: 40 minutes  SIGNED:  Vernell Leep, MD, FACP, [facility]. [facility] Pager [phone]  If 7PM-7AM, please contact night-coverage [email or web address] Password [facility] 01/19/2018, 11:46 AM

## 2018-01-19 NOTE — Discharge Instructions (Signed)
Please get your medications reviewed and adjusted by your Primary MD. ° °Please request your Primary MD to go over all Hospital Tests and Procedure/Radiological results at the follow up, please get all Hospital records sent to your Prim MD by signing hospital release before you go home. ° °If you had Pneumonia of Lung problems at the Hospital: °Please get a 2 view Chest X ray done in 6-8 weeks after hospital discharge or sooner if instructed by your Primary MD. ° °If you have Congestive Heart Failure: °Please call your Cardiologist or Primary MD anytime you have any of the following symptoms:  °1) 3 pound weight gain in 24 hours or 5 pounds in 1 week  °2) shortness of breath, with or without a dry hacking cough  °3) swelling in the hands, feet or stomach  °4) if you have to sleep on extra pillows at night in order to breathe ° °Follow cardiac low salt diet and 1.5 lit/day fluid restriction. ° °If you have diabetes °Accuchecks 4 times/day, Once in AM empty stomach and then before each meal. °Log in all results and show them to your primary doctor at your next visit. °If any glucose reading is under 80 or above 300 call your primary MD immediately. ° °If you have Seizure/Convulsions/Epilepsy: °Please do not drive, operate heavy machinery, participate in activities at heights or participate in high speed sports until you have seen by Primary MD or a Neurologist and advised to do so again. ° °If you had Gastrointestinal Bleeding: °Please ask your Primary MD to check a complete blood count within one week of discharge or at your next visit. Your endoscopic/colonoscopic biopsies that are pending at the time of discharge, will also need to followed by your Primary MD. ° °Get Medicines reviewed and adjusted. °Please take all your medications with you for your next visit with your Primary MD ° °Please request your Primary MD to go over all hospital tests and procedure/radiological results at the follow up, please ask your  Primary MD to get all Hospital records sent to his/her office. ° °If you experience worsening of your admission symptoms, develop shortness of breath, life threatening emergency, suicidal or homicidal thoughts you must seek medical attention immediately by calling 911 or calling your MD immediately  if symptoms less severe. ° °You must read complete instructions/literature along with all the possible adverse reactions/side effects for all the Medicines you take and that have been prescribed to you. Take any new Medicines after you have completely understood and accpet all the possible adverse reactions/side effects.  ° °Do not drive or operate heavy machinery when taking Pain medications.  ° °Do not take more than prescribed Pain, Sleep and Anxiety Medications ° °Special Instructions: If you have smoked or chewed Tobacco  in the last 2 yrs please stop smoking, stop any regular Alcohol  and or any Recreational drug use. ° °Wear Seat belts while driving. ° °Please note °You were cared for by a hospitalist during your hospital stay. If you have any questions about your discharge medications or the care you received while you were in the hospital after you are discharged, you can call the unit and asked to speak with the hospitalist on call if the hospitalist that took care of you is not available. Once you are discharged, your primary care physician will handle any further medical issues. Please note that NO REFILLS for any discharge medications will be authorized once you are discharged, as it is imperative that you   return to your primary care physician (or establish a relationship with a primary care physician if you do not have one) for your aftercare needs so that they can reassess your need for medications and monitor your lab values.  You can reach the hospitalist office at phone [phone] or fax [phone]   If you do not have a primary care physician, you can call [phone] for a physician  referral.  Low-Purine Diet Purines are compounds that affect the level of uric acid in your body. A low-purine diet is a diet that is low in purines. Eating a low-purine diet can prevent the level of uric acid in your body from getting too high and causing gout or kidney stones or both. What do I need to know about this diet?  Choose low-purine foods. Examples of low-purine foods are listed in the next section.  Drink plenty of fluids, especially water. Fluids can help remove uric acid from your body. Try to drink 8-16 cups (1.9-3.8 L) a day.  Limit foods high in fat, especially saturated fat, as fat makes it harder for the body to get rid of uric acid. Foods high in saturated fat include pizza, cheese, ice cream, whole milk, fried foods, and gravies. Choose foods that are lower in fat and lean sources of protein. Use olive oil when cooking as it contains healthy fats that are not high in saturated fat.  Limit alcohol. Alcohol interferes with the elimination of uric acid from your body. If you are having a gout attack, avoid all alcohol.  Keep in mind that different peoples bodies react differently to different foods. You will probably learn over time which foods do or do not affect you. If you discover that a food tends to cause your gout to flare up, avoid eating that food. You can more freely enjoy foods that do not cause problems. If you have any questions about a food item, talk to your dietitian or health care provider. Which foods are low, moderate, and high in purines? The following is a list of foods that are low, moderate, and high in purines. You can eat any amount of the foods that are low in purines. You may be able to have small amounts of foods that are moderate in purines. Ask your health care provider how much of a food moderate in purines you can have. Avoid foods high in purines. Grains  Foods low in purines: Enriched white bread, pasta, rice, cake, cornbread, popcorn.  Foods  moderate in purines: Whole-grain breads and cereals, wheat germ, bran, oatmeal. Uncooked oatmeal. Dry wheat bran or wheat germ.  Foods high in purines: Pancakes, [place] toast, biscuits, muffins. Vegetables  Foods low in purines: All vegetables, except those that are moderate in purines.  Foods moderate in purines: Asparagus, cauliflower, spinach, mushrooms, green peas. Fruits  All fruits are low in purines. Meats and other Protein Foods  Foods low in purines: Eggs, nuts, peanut butter.  Foods moderate in purines: 80-90% lean beef, lamb, veal, pork, poultry, fish, eggs, peanut butter, nuts. Crab, lobster, oysters, and shrimp. Cooked dried beans, peas, and lentils.  Foods high in purines: Anchovies, sardines, herring, mussels, tuna, codfish, scallops, trout, and haddock. Berniece Salines. Organ meats (such as liver or kidney). Tripe. Game meat. Goose. Sweetbreads. Dairy  All dairy foods are low in purines. Low-fat and fat-free dairy products are best because they are low in saturated fat. Beverages  Drinks low in purines: Water, carbonated beverages, tea, coffee, cocoa.  Drinks moderate in  purines: Soft drinks and other drinks sweetened with high-fructose corn syrup. Juices. To find whether a food or drink is sweetened with high-fructose corn syrup, look at the ingredients list.  Drinks high in purines: Alcoholic beverages (such as beer). Condiments  Foods low in purines: Salt, herbs, olives, pickles, relishes, vinegar.  Foods moderate in purines: Butter, margarine, oils, mayonnaise. Fats and Oils  Foods low in purines: All types, except gravies and sauces made with meat.  Foods high in purines: Gravies and sauces made with meat. Other Foods  Foods low in purines: Sugars, sweets, gelatin. Cake. Soups made without meat.  Foods moderate in purines: Meat-based or fish-based soups, broths, or bouillons. Foods and drinks sweetened with high-fructose corn syrup.  Foods high in purines:  High-fat desserts (such as ice cream, cookies, cakes, pies, doughnuts, and chocolate). Contact your dietitian for more information on foods that are not listed here. This information is not intended to replace advice given to you by your health care provider. Make sure you discuss any questions you have with your health care provider. Document Released: 12/16/2010 Document Revised: 01/27/2016 Document Reviewed: 07/28/2013 Elsevier Interactive Patient Education  2017 [place].  Gout Gout is painful swelling that can occur in some of your joints. Gout is a type of arthritis. This condition is caused by having too much uric acid in your body. Uric acid is a chemical that forms when your body breaks down substances called purines. Purines are important for building body proteins. When your body has too much uric acid, sharp crystals can form and build up inside your joints. This causes pain and swelling. Gout attacks can happen quickly and be very painful (acute gout). Over time, the attacks can affect more joints and become more frequent (chronic gout). Gout can also cause uric acid to build up under your skin and inside your kidneys. What are the causes? This condition is caused by too much uric acid in your blood. This can occur because:  Your kidneys do not remove enough uric acid from your blood. This is the most common cause.  Your body makes too much uric acid. This can occur with some cancers and cancer treatments. It can also occur if your body is breaking down too many red blood cells (hemolytic anemia).  You eat too many foods that are high in purines. These foods include organ meats and some seafood. Alcohol, especially beer, is also high in purines.  A gout attack may be triggered by trauma or stress. What increases the risk? This condition is more likely to develop in people who:  Have a family history of gout.  Are male and middle-aged.  Are male and have gone through  menopause.  Are obese.  Frequently drink alcohol, especially beer.  Are dehydrated.  Lose weight too quickly.  Have an organ transplant.  Have lead poisoning.  Take certain medicines, including aspirin, cyclosporine, diuretics, levodopa, and niacin.  Have kidney disease or psoriasis.  What are the signs or symptoms? An attack of acute gout happens quickly. It usually occurs in just one joint. The most common place is the big toe. Attacks often start at night. Other joints that may be affected include joints of the feet, ankle, knee, fingers, wrist, or elbow. Symptoms may include:  Severe pain.  Warmth.  Swelling.  Stiffness.  Tenderness. The affected joint may be very painful to touch.  Shiny, red, or purple skin.  Chills and fever.  Chronic gout may cause symptoms more frequently.  More joints may be involved. You may also have white or yellow lumps (tophi) on your hands or feet or in other areas near your joints. How is this diagnosed? This condition is diagnosed based on your symptoms, medical history, and physical exam. You may have tests, such as:  Blood tests to measure uric acid levels.  Removal of joint fluid with a needle (aspiration) to look for uric acid crystals.  X-rays to look for joint damage.  How is this treated? Treatment for this condition has two phases: treating an acute attack and preventing future attacks. Acute gout treatment may include medicines to reduce pain and swelling, including:  NSAIDs.  Steroids. These are strong anti-inflammatory medicines that can be taken by mouth (orally) or injected into a joint.  Colchicine. This medicine relieves pain and swelling when it is taken soon after an attack. It can be given orally or through an IV tube.  Preventive treatment may include:  Daily use of smaller doses of NSAIDs or colchicine.  Use of a medicine that reduces uric acid levels in your blood.  Changes to your diet. You may need to  see a specialist about healthy eating (dietitian).  Follow these instructions at home: During a Gout Attack  If directed, apply ice to the affected area: ? Put ice in a plastic bag. ? Place a towel between your skin and the bag. ? Leave the ice on for 20 minutes, 2-3 times a day.  Rest the joint as much as possible. If the affected joint is in your leg, you may be given crutches to use.  Raise (elevate) the affected joint above the level of your heart as often as possible.  Drink enough fluids to keep your urine clear or pale yellow.  Take over-the-counter and prescription medicines only as told by your health care provider.  Do not drive or operate heavy machinery while taking prescription pain medicine.  Follow instructions from your health care provider about eating or drinking restrictions.  Return to your normal activities as told by your health care provider. Ask your health care provider what activities are safe for you. Avoiding Future Gout Attacks  Follow a low-purine diet as told by your dietitian or health care provider. Avoid foods and drinks that are high in purines, including liver, kidney, anchovies, asparagus, herring, mushrooms, mussels, and beer.  Limit alcohol intake to no more than 1 drink a day for nonpregnant women and 2 drinks a day for men. One drink equals 12 oz of beer, 5 oz of wine, or 1 oz of hard liquor.  Maintain a healthy weight or lose weight if you are overweight. If you want to lose weight, talk with your health care provider. It is important that you do not lose weight too quickly.  Start or maintain an exercise program as told by your health care provider.  Drink enough fluids to keep your urine clear or pale yellow.  Take over-the-counter and prescription medicines only as told by your health care provider.  Keep all follow-up visits as told by your health care provider. This is important. Contact a health care provider if:  You have  another gout attack.  You continue to have symptoms of a gout attack after10 days of treatment.  You have side effects from your medicines.  You have chills or a fever.  You have burning pain when you urinate.  You have pain in your lower back or belly. Get help right away if:  You have  severe or uncontrolled pain.  You cannot urinate. This information is not intended to replace advice given to you by your health care provider. Make sure you discuss any questions you have with your health care provider. Document Released: 08/18/2000 Document Revised: 01/27/2016 Document Reviewed: 06/03/2015 Elsevier Interactive Patient Education  2018 [employer]. Rivaroxaban oral tablets What is this medicine? RIVAROXABAN (ri va ROX a ban) is an anticoagulant (blood thinner). It is used to treat blood clots in the lungs or in the veins. It is also used after knee or hip surgeries to prevent blood clots. It is also used to lower the chance of stroke in people with a medical condition called atrial fibrillation. This medicine may be used for other purposes; ask your health care provider or pharmacist if you have questions. COMMON BRAND NAME(S): Xarelto, Xarelto Starter Pack What should I tell my health care provider before I take this medicine? They need to know if you have any of these conditions: -bleeding disorders -bleeding in the brain -blood in your stools (black or tarry stools) or if you have blood in your vomit -history of stomach bleeding -kidney disease -liver disease -low blood counts, like low white cell, platelet, or red cell counts -recent or planned spinal or epidural procedure -take medicines that treat or prevent blood clots -an unusual or allergic reaction to rivaroxaban, other medicines, foods, dyes, or preservatives -pregnant or trying to get pregnant -breast-feeding How should I use this medicine? Take this medicine by mouth with a glass of water. Follow the directions on  the prescription label. Take your medicine at regular intervals. Do not take it more often than directed. Do not stop taking except on your doctor's advice. Stopping this medicine may increase your risk of a blood clot. Be sure to refill your prescription before you run out of medicine. If you are taking this medicine after hip or knee replacement surgery, take it with or without food. If you are taking this medicine for atrial fibrillation, take it with your evening meal. If you are taking this medicine to treat blood clots, take it with food at the same time each day. If you are unable to swallow your tablet, you may crush the tablet and mix it in applesauce. Then, immediately eat the applesauce. You should eat more food right after you eat the applesauce containing the crushed tablet. Talk to your pediatrician regarding the use of this medicine in children. Special care may be needed. Overdosage: If you think you have taken too much of this medicine contact a poison control center or emergency room at once. NOTE: This medicine is only for you. Do not share this medicine with others. What if I miss a dose? If you take your medicine once a day and miss a dose, take the missed dose as soon as you remember. If you take your medicine twice a day and miss a dose, take the missed dose immediately. In this instance, 2 tablets may be taken at the same time. The next day you should take 1 tablet twice a day as directed. What may interact with this medicine? Do not take this medicine with any of the following medications: -defibrotide This medicine may also interact with the following medications: -aspirin and aspirin-like medicines -certain antibiotics like erythromycin, azithromycin, and clarithromycin -certain medicines for fungal infections like ketoconazole and itraconazole -certain medicines for irregular heart beat like amiodarone, quinidine, dronedarone -certain medicines for seizures like  carbamazepine, phenytoin -certain medicines that treat or prevent blood  clots like warfarin, enoxaparin, and dalteparin -conivaptan -diltiazem -felodipine -indinavir -lopinavir; ritonavir -NSAIDS, medicines for pain and inflammation, like ibuprofen or naproxen -ranolazine -rifampin -ritonavir -SNRIs, medicines for depression, like desvenlafaxine, duloxetine, levomilnacipran, venlafaxine -SSRIs, medicines for depression, like citalopram, escitalopram, fluoxetine, fluvoxamine, paroxetine, sertraline -St. John's wort -verapamil This list may not describe all possible interactions. Give your health care provider a list of all the medicines, herbs, non-prescription drugs, or dietary supplements you use. Also tell them if you smoke, drink alcohol, or use illegal drugs. Some items may interact with your medicine. What should I watch for while using this medicine? Visit your doctor or health care professional for regular checks on your progress. Notify your doctor or health care professional and seek emergency treatment if you develop breathing problems; changes in vision; chest pain; severe, sudden headache; pain, swelling, warmth in the leg; trouble speaking; sudden numbness or weakness of the face, arm or leg. These can be signs that your condition has gotten worse. If you are going to have surgery or other procedure, tell your doctor that you are taking this medicine. What side effects may I notice from receiving this medicine? Side effects that you should report to your doctor or health care professional as soon as possible: -allergic reactions like skin rash, itching or hives, swelling of the face, lips, or tongue -back pain -redness, blistering, peeling or loosening of the skin, including inside the mouth -signs and symptoms of bleeding such as bloody or black, tarry stools; red or dark-brown urine; spitting up blood or brown material that looks like coffee grounds; red spots on the skin;  unusual bruising or bleeding from the eye, gums, or nose Side effects that usually do not require medical attention (report to your doctor or health care professional if they continue or are bothersome): -dizziness -muscle pain This list may not describe all possible side effects. Call your doctor for medical advice about side effects. You may report side effects to FDA at 1-800-FDA-1088. Where should I keep my medicine? Keep out of the reach of children. Store at room temperature between 15 and 30 degrees C (59 and 86 degrees F). Throw away any unused medicine after the expiration date. NOTE: This sheet is a summary. It may not cover all possible information. If you have questions about this medicine, talk to your doctor, pharmacist, or health care provider.  2018 Elsevier/Gold Standard (2016-05-10 16:29:33) Atrial Fibrillation Atrial fibrillation is a type of irregular or rapid heartbeat (arrhythmia). In atrial fibrillation, the heart quivers continuously in a chaotic pattern. This occurs when parts of the heart receive disorganized signals that make the heart unable to pump blood normally. This can increase the risk for stroke, heart failure, and other heart-related conditions. There are different types of atrial fibrillation, including:  Paroxysmal atrial fibrillation. This type starts suddenly, and it usually stops on its own shortly after it starts.  Persistent atrial fibrillation. This type often lasts longer than a week. It may stop on its own or with treatment.  Long-lasting persistent atrial fibrillation. This type lasts longer than 12 months.  Permanent atrial fibrillation. This type does not go away.  Talk with your health care provider to learn about the type of atrial fibrillation that you have. What are the causes? This condition is caused by some heart-related conditions or procedures, including:  A heart attack.  Coronary artery disease.  Heart failure.  Heart valve  conditions.  High blood pressure.  Inflammation of the sac that surrounds the heart (  pericarditis).  Heart surgery.  Certain heart rhythm disorders, such as Wolf-Parkinson-White syndrome.  Other causes include:  Pneumonia.  Obstructive sleep apnea.  Blockage of an artery in the lungs (pulmonary embolism, or PE).  Lung cancer.  Chronic lung disease.  Thyroid problems, especially if the thyroid is overactive (hyperthyroidism).  Caffeine.  Excessive alcohol use or illegal drug use.  Use of some medicines, including certain decongestants and diet pills.  Sometimes, the cause cannot be found. What increases the risk? This condition is more likely to develop in:  People who are older in age.  People who smoke.  People who have diabetes mellitus.  People who are overweight (obese).  Athletes who exercise vigorously.  What are the signs or symptoms? Symptoms of this condition include:  A feeling that your heart is beating rapidly or irregularly.  A feeling of discomfort or pain in your chest.  Shortness of breath.  Sudden light-headedness or weakness.  Getting tired easily during exercise.  In some cases, there are no symptoms. How is this diagnosed? Your health care provider may be able to detect atrial fibrillation when taking your pulse. If detected, this condition may be diagnosed with:  An electrocardiogram (ECG).  A Holter monitor test that records your heartbeat patterns over a 24-hour period.  Transthoracic echocardiogram (TTE) to evaluate how blood flows through your heart.  Transesophageal echocardiogram (TEE) to view more detailed images of your heart.  A stress test.  Imaging tests, such as a CT scan or chest X-ray.  Blood tests.  How is this treated? The main goals of treatment are to prevent blood clots from forming and to keep your heart beating at a normal rate and rhythm. The type of treatment that you receive depends on many  factors, such as your underlying medical conditions and how you feel when you are experiencing atrial fibrillation. This condition may be treated with:  Medicine to slow down the heart rate, bring the hearts rhythm back to normal, or prevent clots from forming.  Electrical cardioversion. This is a procedure that resets your hearts rhythm by delivering a controlled, low-energy shock to the heart through your skin.  Different types of ablation, such as catheter ablation, catheter ablation with pacemaker, or surgical ablation. These procedures destroy the heart tissues that send abnormal signals. When the pacemaker is used, it is placed under your skin to help your heart beat in a regular rhythm.  Follow these instructions at home:  Take over-the counter and prescription medicines only as told by your health care provider.  If your health care provider prescribed a blood-thinning medicine (anticoagulant), take it exactly as told. Taking too much blood-thinning medicine can cause bleeding. If you do not take enough blood-thinning medicine, you will not have the protection that you need against stroke and other problems.  Do not use tobacco products, including cigarettes, chewing tobacco, and e-cigarettes. If you need help quitting, ask your health care provider.  If you have obstructive sleep apnea, manage your condition as told by your health care provider.  Do not drink alcohol.  Do not drink beverages that contain caffeine, such as coffee, soda, and tea.  Maintain a healthy weight. Do not use diet pills unless your health care provider approves. Diet pills may make heart problems worse.  Follow diet instructions as told by your health care provider.  Exercise regularly as told by your health care provider.  Keep all follow-up visits as told by your health care provider. This is important.  How is this prevented?  Avoid drinking beverages that contain caffeine or alcohol.  Avoid  certain medicines, especially medicines that are used for breathing problems.  Avoid certain herbs and herbal medicines, such as those that contain ephedra or ginseng.  Do not use illegal drugs, such as cocaine and amphetamines.  Do not smoke.  Manage your high blood pressure. Contact a health care provider if:  You notice a change in the rate, rhythm, or strength of your heartbeat.  You are taking an anticoagulant and you notice increased bruising.  You tire more easily when you exercise or exert yourself. Get help right away if:  You have chest pain, abdominal pain, sweating, or weakness.  You feel nauseous.  You notice blood in your vomit, bowel movement, or urine.  You have shortness of breath.  You suddenly have swollen feet and ankles.  You feel dizzy.  You have sudden weakness or numbness of the face, arm, or leg, especially on one side of the body.  You have trouble speaking, trouble understanding, or both (aphasia).  Your face or your eyelid droops on one side. These symptoms may represent a serious problem that is an emergency. Do not wait to see if the symptoms will go away. Get medical help right away. Call your local emergency services (911 in the [place] Do not drive yourself to the hospital. This information is not intended to replace advice given to you by your health care provider. Make sure you discuss any questions you have with your health care provider. Document Released: 08/21/2005 Document Revised: 12/29/2015 Document Reviewed: 12/16/2014 Elsevier Interactive Patient Education  [employer].

## 2018-01-20 ENCOUNTER — Encounter (HOSPITAL_COMMUNITY): Payer: Self-pay | Admitting: Cardiology

## 2018-01-23 ENCOUNTER — Telehealth: Payer: Self-pay | Admitting: Physician Assistant

## 2018-01-23 ENCOUNTER — Other Ambulatory Visit: Payer: Self-pay | Admitting: Internal Medicine

## 2018-01-23 NOTE — Telephone Encounter (Signed)
TOC Patient-- Please call Patient-- Pt has an appointment with Rosaria Ferries on 01-30-18.

## 2018-01-24 NOTE — Telephone Encounter (Signed)
Patient contacted regarding discharge from [facility] on 01/19/18  Patient understands to follow up with provider barrett on 01/30/18 at 2 pm at [facility] Patient understands discharge instructions? yes Patient understands medications and regiment? yes Patient understands to bring all medications to this visit? yes

## 2018-01-30 ENCOUNTER — Encounter: Payer: Self-pay | Admitting: Physician Assistant

## 2018-01-30 ENCOUNTER — Ambulatory Visit: Payer: PRIVATE HEALTH INSURANCE | Admitting: Physician Assistant

## 2018-01-30 VITALS — BP 120/52 | HR 56 | Ht 74.0 in | Wt 307.0 lb

## 2018-01-30 DIAGNOSIS — R0789 Other chest pain: Secondary | ICD-10-CM | POA: Diagnosis not present

## 2018-01-30 DIAGNOSIS — R079 Chest pain, unspecified: Secondary | ICD-10-CM

## 2018-01-30 DIAGNOSIS — I10 Essential (primary) hypertension: Secondary | ICD-10-CM

## 2018-01-30 DIAGNOSIS — I4891 Unspecified atrial fibrillation: Secondary | ICD-10-CM | POA: Diagnosis not present

## 2018-01-30 MED ORDER — METOPROLOL SUCCINATE ER 25 MG PO TB24: 25.0000 mg | ORAL_TABLET | Freq: Every day | ORAL | 3 refills | Status: DC

## 2018-01-30 NOTE — Progress Notes (Signed)
Cardiology Office Note   Date:  01/30/2018   ID:  Samuel Flynn, DOB 10-03-1975, MRN [id number]  PCP:  Helane Rima, MD  Cardiologist: Dr. Stanford Breed, 01/18/2018 in hospital Rosaria Ferries, PA-C   Chief Complaint  Patient presents with  . Follow-up  . Chest Pain  . Shortness of Breath  . Edema    Ankles and legs.    History of Present Illness: Samuel Flynn is a 42 y.o. male with a history of atrial fibrillationnoncompliant withXarelto, HTN, obesity and OSAon CPAP  Admitted 5/14-5/18/2019 with rapid atrial fibrillation associated with fleeting left chest pain, gout, s/p TEE/DCCV, on Toprol-XL 50 mg daily and flecainide 50 mg twice daily, ETT ordered  Samuel Flynn presents for cardiology follow up.  He has been told his HR is low, HR 49 documented in the hospital after DCCV.   He has noticed his HR to be low after discharge, it makes him SOB, tired, light-headed.   He has not felt like he was back in afib.  He is tolerating the Xarelto well, at first he was a little irritable after d/c, but that has improved.   Other than the fatigue, he feels pretty much back to normal.   He gets a cramping pain in his L chest at times. It happens while sitting still, no association with exertion.  It resolves spontaneously, he has not tried any medication for it.  It resolves fast enough that he has not really thought about trying anything.  The chest pain is not associated with shortness of breath, nausea, vomiting or diaphoresis.   Past Medical History:  Diagnosis Date  . Atrial fibrillation with RVR ([place])   . Cellulitis 04/2016   RT FOOT  . Gastric ulcer   . Gout   . Hypertension   . Paroxysmal A-fib ([place])   . Sleep apnea    USES CPAP  . Wears glasses     Past Surgical History:  Procedure Laterality Date  . CARDIOVERSION N/A 01/18/2018   Procedure: CARDIOVERSION;  Surgeon: Larey Dresser, MD;  Location: [facility] ENDOSCOPY;  Service: Cardiovascular;   Laterality: N/A;  . ELBOW LIGAMENT RECONSTRUCTION Right 09/29/2014   Procedure: Merian Capron FRACTURE FIXATION, POSSIBLE LIGAMENT REPAIR;  Surgeon: Nita Sells, MD;  Location: [facility];  Service: Orthopedics;  Laterality: Right;  Right elbow coranoid fracture fixation, possible ligament repair  . TEE WITHOUT CARDIOVERSION N/A 01/18/2018   Procedure: TRANSESOPHAGEAL ECHOCARDIOGRAM (TEE);  Surgeon: Larey Dresser, MD;  Location: [facility] ENDOSCOPY;  Service: Cardiovascular;  Laterality: N/A;  . WISDOM TOOTH EXTRACTION      Current Outpatient Medications  Medication Sig Dispense Refill  . allopurinol (ZYLOPRIM) 100 MG tablet Take 100 mg by mouth daily. For gout    . colchicine 0.6 MG tablet Take 0.6 mg by mouth daily as needed (gout flare).   1  . dicyclomine (BENTYL) 20 MG tablet Take 20 mg by mouth 4 (four) times daily -  before meals and at bedtime.    . flecainide (TAMBOCOR) 50 MG tablet Take 1 tablet (50 mg total) by mouth 2 (two) times daily. 60 tablet 0  . ibuprofen (ADVIL,MOTRIN) 200 MG tablet Take 800 mg by mouth every 6 (six) hours as needed for headache or moderate pain.    Marland Kitchen lisinopril (PRINIVIL,ZESTRIL) 20 MG tablet Take 20 mg by mouth daily. For blood pressure    . metoprolol succinate (TOPROL-XL) 50 MG 24 hr tablet Take 1 tablet (50 mg total) by  mouth daily. 30 tablet 0  . Omega-3 Fatty Acids (FISH OIL PO) Take 1 tablet by mouth daily.    . pantoprazole (PROTONIX) 40 MG tablet TAKE 1 TABLET (40 MG TOTAL) BY MOUTH DAILY. NEED OV. 15 tablet 0  . rivaroxaban (XARELTO) 20 MG TABS tablet Take 1 tablet (20 mg total) by mouth daily with supper. 30 tablet 0   No current facility-administered medications for this visit.     Allergies:   Patient has no known allergies.    Social History:  The patient  reports that he quit smoking about 1 years ago. His smoking use included cigars. He has never used smokeless tobacco. He reports that he drinks alcohol. He reports that  he does not use drugs.   Family History:  The patient's family history includes Hypertension in his maternal grandmother, paternal uncle, paternal uncle, paternal uncle, paternal uncle, and paternal uncle.    ROS:  Please see the history of present illness. All other systems are reviewed and negative.    PHYSICAL EXAM: VS:  BP (!) 120/52 (BP Location: Left Arm, Patient Position: Sitting, Cuff Size: Large)   Pulse (!) 56   Ht 6\' 2"  (1.88 m)   Wt (!) 307 lb (139.3 kg)   BMI 39.42 kg/m  , BMI Body mass index is 39.42 kg/m. GEN: Well nourished, well developed, male in no acute distress  HEENT: normal for age  Neck: no JVD, no carotid bruit, no masses Cardiac: Slow but RRR; no murmur, no rubs, or gallops Respiratory:  clear to auscultation bilaterally, normal work of breathing GI: soft, nontender, nondistended, + BS MS: no deformity or atrophy; no edema; distal pulses are 2+ in all 4 extremities   Skin: warm and dry, no rash Neuro:  Strength and sensation are intact Psych: euthymic mood, full affect   EKG:  EKG is ordered today. The ekg ordered today demonstrates sinus bradycardia, heart rate 56, no acute ischemic changes and normal intervals  Echocardiogram 01/16/2018: Study Conclusions - Left ventricle: The cavity size was normal. There was moderate focal basal hypertrophy. Systolic function was normal. The estimated ejection fraction was in the range of 60% to 65%. Wall motion was normal; there were no regional wall motion abnormalities. - Mitral valve: Calcified annulus. There was trivial regurgitation. - Tricuspid valve: There was trivial regurgitation.  Recent Labs: 01/15/2018: B Natriuretic Peptide 101.7 01/16/2018: TSH 1.575 01/18/2018: BUN 15; Creatinine, Ser 1.31; Hemoglobin 15.4; Magnesium 2.1; Platelets 223; Potassium 4.1; Sodium 141    Lipid Panel    Component Value Date/Time   CHOL 211 (H) 01/16/2018 0916   TRIG 323 (H) 01/16/2018 0916   HDL 24 (L)  01/16/2018 0916   CHOLHDL 8.8 01/16/2018 0916   VLDL 65 (H) 01/16/2018 0916   LDLCALC 122 (H) 01/16/2018 0916     Wt Readings from Last 3 Encounters:  01/30/18 (!) 307 lb (139.3 kg)  01/15/18 285 lb 0.9 oz (129.3 kg)  01/18/17 267 lb (121.1 kg)     Other studies Reviewed: Additional studies/ records that were reviewed today include: Hospital records, office notes and testing.  ASSESSMENT AND PLAN:  1.  Persistent atrial fibrillation with rapid ventricular response: He is maintaining sinus rhythm after cardioversion, on flecainide, metoprolol, and Xarelto. -He feels the bradycardia is causing symptoms, cut the metoprolol in half.  If his symptoms do not improve it may have to be discontinued.  - I explained the reasoning behind the need for a stress test while on the flecainide,  requesting he schedule it as he leaves today.  2.  Hypertension: His blood pressure is well controlled on current medication  3.  Chest pain: His symptoms are not typical of cardiac symptoms.  They are currently not lasting long enough to cause him significant concern and do not have any exertional component -No further evaluation is indicated at this time   Current medicines are reviewed at length with the patient today.  The patient has concerns regarding medicines.  Concerns were addressed  The following changes have been made: Decrease metoprolol  Labs/ tests ordered today include:  No orders of the defined types were placed in this encounter.    Disposition:   FU with Dr. Stanford Breed  Signed, Rosaria Ferries, PA-C  01/30/2018 1:47 PM    [place] Phone: [phone]; Fax: [phone]  This note was written with the assistance of speech recognition software. Please excuse any transcriptional errors.

## 2018-01-30 NOTE — Patient Instructions (Signed)
Medication Instructions:  DECREASE metoprolol succinate (Toprol XL) to 25 mg daily  Testing/Procedures: Your physician has requested that you have an exercise tolerance test (order active). For further information please visit [email or web address]. Please also follow instruction sheet, as given.  Follow-Up: 3 months with Dr. Stanford Breed  Any Other Special Instructions Will Be Listed Below (If Applicable).     If you need a refill on your cardiac medications before your next appointment, please call your pharmacy.

## 2018-02-05 ENCOUNTER — Telehealth (HOSPITAL_COMMUNITY): Payer: Self-pay | Admitting: *Deleted

## 2018-02-05 NOTE — Telephone Encounter (Signed)
Patient given detailed instructions per Exercise stress Information Sheet for the test on 02/07/18 at 1530. Patient notified to arrive 15 minutes early and that it is imperative to arrive on time for appointment to keep from having the test rescheduled.  If you need to cancel or reschedule your appointment, please call the office within 24 hours of your appointment. . Patient verbalized understanding.Jolanda Mccann, [place]anae [place]

## 2018-02-07 ENCOUNTER — Ambulatory Visit (HOSPITAL_COMMUNITY)
Admission: RE | Admit: 2018-02-07 | Discharge: 2018-02-07 | Disposition: A | Discharge status: Home / Self Care | Payer: PRIVATE HEALTH INSURANCE | Source: Ambulatory Visit | Attending: Cardiology | Admitting: Cardiology

## 2018-02-07 DIAGNOSIS — R0789 Other chest pain: Secondary | ICD-10-CM

## 2018-02-07 DIAGNOSIS — R079 Chest pain, unspecified: Secondary | ICD-10-CM

## 2018-02-07 LAB — EXERCISE TOLERANCE TEST
CHL RATE OF PERCEIVED EXERTION: 18
CSEPED: 7 min
CSEPEDS: 0 s
CSEPHR: 75 %
Estimated workload: 8.5 METS
MPHR: 177 {beats}/min
Peak HR: 134 {beats}/min
Rest HR: 61 {beats}/min

## 2018-02-08 NOTE — Progress Notes (Signed)
Pt has been made aware of normal result and verbalized understanding.  jw 02/08/18

## 2018-02-22 ENCOUNTER — Emergency Department (HOSPITAL_COMMUNITY): Payer: PRIVATE HEALTH INSURANCE

## 2018-02-22 ENCOUNTER — Other Ambulatory Visit: Payer: Self-pay

## 2018-02-22 ENCOUNTER — Emergency Department (HOSPITAL_COMMUNITY)
Admission: EM | Admit: 2018-02-22 | Discharge: 2018-02-22 | Disposition: A | Discharge status: Home / Self Care | Payer: PRIVATE HEALTH INSURANCE | Attending: Emergency Medicine | Admitting: Emergency Medicine

## 2018-02-22 ENCOUNTER — Telehealth: Payer: Self-pay | Admitting: Cardiovascular Disease

## 2018-02-22 DIAGNOSIS — Z79899 Other long term (current) drug therapy: Secondary | ICD-10-CM | POA: Diagnosis not present

## 2018-02-22 DIAGNOSIS — I480 Paroxysmal atrial fibrillation: Secondary | ICD-10-CM | POA: Diagnosis not present

## 2018-02-22 DIAGNOSIS — Z7901 Long term (current) use of anticoagulants: Secondary | ICD-10-CM | POA: Insufficient documentation

## 2018-02-22 DIAGNOSIS — I10 Essential (primary) hypertension: Secondary | ICD-10-CM | POA: Diagnosis not present

## 2018-02-22 DIAGNOSIS — R0602 Shortness of breath: Secondary | ICD-10-CM | POA: Insufficient documentation

## 2018-02-22 DIAGNOSIS — R002 Palpitations: Secondary | ICD-10-CM | POA: Diagnosis present

## 2018-02-22 DIAGNOSIS — Z87891 Personal history of nicotine dependence: Secondary | ICD-10-CM | POA: Diagnosis not present

## 2018-02-22 DIAGNOSIS — I4891 Unspecified atrial fibrillation: Secondary | ICD-10-CM

## 2018-02-22 DIAGNOSIS — R079 Chest pain, unspecified: Secondary | ICD-10-CM

## 2018-02-22 LAB — CBC WITH DIFFERENTIAL/PLATELET
Abs Immature Granulocytes: 0 10*3/uL (ref 0.0–0.1)
BASOS ABS: 0 10*3/uL (ref 0.0–0.1)
BASOS PCT: 1 %
EOS PCT: 2 %
Eosinophils Absolute: 0.1 10*3/uL (ref 0.0–0.7)
HCT: 47.7 % (ref 39.0–52.0)
HEMOGLOBIN: 15.7 g/dL (ref 13.0–17.0)
Immature Granulocytes: 1 %
LYMPHS PCT: 26 %
Lymphs Abs: 1.6 10*3/uL (ref 0.7–4.0)
MCH: 27.5 pg (ref 26.0–34.0)
MCHC: 32.9 g/dL (ref 30.0–36.0)
MCV: 83.7 fL (ref 78.0–100.0)
MONO ABS: 0.2 10*3/uL (ref 0.1–1.0)
MONOS PCT: 4 %
Neutro Abs: 4.1 10*3/uL (ref 1.7–7.7)
Neutrophils Relative %: 66 %
Platelets: 241 10*3/uL (ref 150–400)
RBC: 5.7 MIL/uL (ref 4.22–5.81)
RDW: 18.2 % — ABNORMAL HIGH (ref 11.5–15.5)
WBC: 6.1 10*3/uL (ref 4.0–10.5)

## 2018-02-22 LAB — BASIC METABOLIC PANEL
Anion gap: 11 (ref 5–15)
BUN: 19 mg/dL (ref 6–20)
CHLORIDE: 103 mmol/L (ref 101–111)
CO2: 21 mmol/L — ABNORMAL LOW (ref 22–32)
CREATININE: 2.05 mg/dL — AB (ref 0.61–1.24)
Calcium: 8.4 mg/dL — ABNORMAL LOW (ref 8.9–10.3)
GFR calc Af Amer: 44 mL/min — ABNORMAL LOW (ref >60)
GFR calc non Af Amer: 38 mL/min — ABNORMAL LOW (ref >60)
GLUCOSE: 181 mg/dL — AB (ref 65–99)
POTASSIUM: 4.5 mmol/L (ref 3.5–5.1)
SODIUM: 135 mmol/L (ref 135–145)

## 2018-02-22 LAB — I-STAT CHEM 8, ED
BUN: 21 mg/dL — ABNORMAL HIGH (ref 6–20)
CREATININE: 1.6 mg/dL — AB (ref 0.61–1.24)
Calcium, Ion: 1.05 mmol/L — ABNORMAL LOW (ref 1.15–1.40)
Chloride: 106 mmol/L (ref 101–111)
Glucose, Bld: 87 mg/dL (ref 65–99)
HEMATOCRIT: 43 % (ref 39.0–52.0)
HEMOGLOBIN: 14.6 g/dL (ref 13.0–17.0)
Potassium: 4.4 mmol/L (ref 3.5–5.1)
SODIUM: 139 mmol/L (ref 135–145)
TCO2: 22 mmol/L (ref 22–32)

## 2018-02-22 LAB — MAGNESIUM: MAGNESIUM: 2.3 mg/dL (ref 1.7–2.4)

## 2018-02-22 LAB — BRAIN NATRIURETIC PEPTIDE: B NATRIURETIC PEPTIDE 5: 237 pg/mL — AB (ref 0.0–100.0)

## 2018-02-22 MED ORDER — FLECAINIDE ACETATE 100 MG PO TABS
100.0000 mg | ORAL_TABLET | Freq: Once | ORAL | Status: AC
  Administered 2018-02-22: 100 mg via ORAL
  Filled 2018-02-22: qty 1

## 2018-02-22 MED ORDER — PANTOPRAZOLE SODIUM 40 MG PO TBEC: 40.0000 mg | DELAYED_RELEASE_TABLET | Freq: Every day | ORAL | 2 refills | Status: DC

## 2018-02-22 MED ORDER — RIVAROXABAN 20 MG PO TABS: 20.0000 mg | ORAL_TABLET | Freq: Every day | ORAL | 0 refills | Status: DC

## 2018-02-22 MED ORDER — FLECAINIDE ACETATE 50 MG PO TABS: 100.0000 mg | ORAL_TABLET | Freq: Two times a day (BID) | ORAL | 0 refills | Status: DC

## 2018-02-22 MED ORDER — LISINOPRIL 20 MG PO TABS: 20.0000 mg | ORAL_TABLET | Freq: Every day | ORAL | 2 refills | Status: DC

## 2018-02-22 MED ORDER — METOPROLOL SUCCINATE ER 25 MG PO TB24: 25.0000 mg | ORAL_TABLET | Freq: Every day | ORAL | 2 refills | Status: DC

## 2018-02-22 MED ORDER — SODIUM CHLORIDE 0.9 % IV BOLUS
1000.0000 mL | Freq: Once | INTRAVENOUS | Status: AC
  Administered 2018-02-22: 1000 mL via INTRAVENOUS

## 2018-02-22 MED ORDER — SODIUM CHLORIDE 0.9 % IV BOLUS
500.0000 mL | Freq: Once | INTRAVENOUS | Status: AC
  Administered 2018-02-22: 500 mL via INTRAVENOUS

## 2018-02-22 MED ORDER — ETOMIDATE 2 MG/ML IV SOLN
INTRAVENOUS | Status: AC
  Filled 2018-02-22: qty 10

## 2018-02-22 NOTE — ED Provider Notes (Addendum)
[facility] EMERGENCY DEPARTMENT Provider Note   CSN: [id number] Arrival date & time: 02/22/18  0455     History   Chief Complaint Chief Complaint  Patient presents with  . Atrial Fibrillation    Symptomatic    HPI Samuel Flynn is a 42 y.o. male.  HPI   42 year old male with history of recent admission and DC cardioversion for A. fib RVR here with recurrent A. fib RVR.  Patient states that over the last several hours, he has had persistently worsening palpitations and severe shortness of breath.  He states he has been having intermittent episodes of A. fib since his recent admission.  He recently had his metoprolol dose cut in half and has been on flecainide.  On arrival, patient diaphoretic, confused, and hypotensive.  He appears unwell.  He endorses a dull, aching, substernal chest pressure.  Denies any missed doses of his Xarelto.  Is been taking his other meds as prescribed.  No recent fevers or chills.  No other complaints.  Level 5 caveat invoked as remainder of history, ROS, and physical exam limited due to patient's acuity.   Past Medical History:  Diagnosis Date  . Atrial fibrillation with RVR ([place])   . Cellulitis 04/2016   RT FOOT  . Gastric ulcer   . Gout   . Hypertension   . Paroxysmal A-fib ([place])   . Sleep apnea    USES CPAP  . Wears glasses     Patient Active Problem List   Diagnosis Date Noted  . Atrial fibrillation, rapid ([place]) 01/15/2018  . Failure to attend appointment 04/25/2016  . Cellulitis of right foot 04/13/2016  . Atrial fibrillation with RVR ([place]) 04/10/2016  . Cellulitis of foot, right 04/10/2016  . Elevated troponin I level 04/10/2016  . Atrial fibrillation with rapid ventricular response ([place]) 10/18/2015  . Fracture of coronoid process of ulna, right, closed 09/21/2014  . Essential hypertension, benign 02/19/2014  . DUODENAL ULCER 09/18/2007  . RENAL CYST, LEFT 09/06/2007  . FLANK PAIN, RIGHT 08/26/2007  .  PAIN IN THORACIC SPINE 02/18/2007  . BACK PAIN, LUMBAR 01/09/2007  . SYNCOPE 01/03/2007  . Gout 12/27/2006  . MORBID OBESITY 12/27/2006  . CARDIAC ARRHYTHMIA 12/27/2006  . SYMPTOM, DISTURBANCE OF SKIN SENSATION 12/27/2006    Past Surgical History:  Procedure Laterality Date  . CARDIOVERSION N/A 01/18/2018   Procedure: CARDIOVERSION;  Surgeon: Larey Dresser, MD;  Location: [facility] ENDOSCOPY;  Service: Cardiovascular;  Laterality: N/A;  . ELBOW LIGAMENT RECONSTRUCTION Right 09/29/2014   Procedure: Merian Capron FRACTURE FIXATION, POSSIBLE LIGAMENT REPAIR;  Surgeon: Nita Sells, MD;  Location: [facility];  Service: Orthopedics;  Laterality: Right;  Right elbow coranoid fracture fixation, possible ligament repair  . TEE WITHOUT CARDIOVERSION N/A 01/18/2018   Procedure: TRANSESOPHAGEAL ECHOCARDIOGRAM (TEE);  Surgeon: Larey Dresser, MD;  Location: [facility] ENDOSCOPY;  Service: Cardiovascular;  Laterality: N/A;  . WISDOM TOOTH EXTRACTION          Home Medications    Prior to Admission medications   Medication Sig Start Date End Date Taking? Authorizing Provider  allopurinol (ZYLOPRIM) 100 MG tablet Take 100 mg by mouth daily. For gout    [provider]  colchicine 0.6 MG tablet Take 0.6 mg by mouth daily as needed (gout flare).  08/20/15   [provider]  dicyclomine (BENTYL) 20 MG tablet Take 20 mg by mouth 4 (four) times daily -  before meals and at bedtime.    [provider]  flecainide (TAMBOCOR) 50 MG tablet Take 2 tablets (100 mg total) by mouth 2 (two) times daily. 02/22/18 03/24/18  Duffy Bruce, MD  ibuprofen (ADVIL,MOTRIN) 200 MG tablet Take 800 mg by mouth every 6 (six) hours as needed for headache or moderate pain.    [provider]  lisinopril (PRINIVIL,ZESTRIL) 20 MG tablet Take 20 mg by mouth daily. For blood pressure 10/07/15   [provider]  metoprolol succinate (TOPROL-XL) 25 MG 24 hr tablet Take 1 tablet (25  mg total) by mouth daily. 01/30/18   Barrett, Evelene Croon, PA-C  Omega-3 Fatty Acids (FISH OIL PO) Take 1 tablet by mouth daily.    [provider]  pantoprazole (PROTONIX) 40 MG tablet TAKE 1 TABLET (40 MG TOTAL) BY MOUTH DAILY. NEED OV. 01/07/18   Pixie Casino, MD  rivaroxaban (XARELTO) 20 MG TABS tablet Take 1 tablet (20 mg total) by mouth daily with supper. 01/19/18   Hongalgi, Lenis Dickinson, MD    Family History Family History  Problem Relation Age of Onset  . Hypertension Paternal Uncle   . Hypertension Maternal Grandmother   . Hypertension Paternal Uncle   . Hypertension Paternal Uncle   . Hypertension Paternal Uncle   . Hypertension Paternal Uncle     Social History Social History   Tobacco Use  . Smoking status: Former Smoker    Types: Cigars    Last attempt to quit: 02/03/2016    Years since quitting: 2.0  . Smokeless tobacco: Never Used  Substance Use Topics  . Alcohol use: Yes    Alcohol/week: 0.0 oz    Comment: occ  . Drug use: No     Allergies   Patient has no known allergies.   Review of Systems Review of Systems  Constitutional: Positive for diaphoresis and fatigue. Negative for chills and fever.  HENT: Negative for congestion and rhinorrhea.   Eyes: Negative for visual disturbance.  Respiratory: Positive for chest tightness and shortness of breath. Negative for cough and wheezing.   Cardiovascular: Positive for palpitations. Negative for chest pain and leg swelling.  Gastrointestinal: Negative for abdominal pain, diarrhea, nausea and vomiting.  Genitourinary: Negative for dysuria and flank pain.  Musculoskeletal: Negative for neck pain and neck stiffness.  Skin: Negative for rash and wound.  Allergic/Immunologic: Negative for immunocompromised state.  Neurological: Positive for weakness and light-headedness. Negative for syncope and headaches.  All other systems reviewed and are negative.    Physical Exam Updated Vital Signs BP 110/62   Pulse  (!) 50   Resp (!) 22   Ht 6\' 2"  (1.88 m)   Wt 136.1 kg (300 lb)   SpO2 100%   BMI 38.52 kg/m   Physical Exam  Constitutional: He is oriented to person, place, and time. He appears well-developed and well-nourished. He appears distressed.  HENT:  Head: Normocephalic and atraumatic.  Eyes: Conjunctivae are normal.  Neck: Neck supple.  Cardiovascular: Normal heart sounds. An irregularly irregular rhythm present. Tachycardia present. Exam reveals no friction rub.  No murmur heard. Pulmonary/Chest: Effort normal and breath sounds normal. Tachypnea noted. No respiratory distress. He has no wheezes. He has no rales.  Abdominal: He exhibits no distension.  Musculoskeletal: He exhibits no edema.  Neurological: He is alert and oriented to person, place, and time. He exhibits normal muscle tone.  Skin: Skin is warm. Capillary refill takes less than 2 seconds. He is diaphoretic.  Psychiatric: He has a normal mood and affect.  Nursing note and  vitals reviewed.    ED Treatments / Results  Labs (all labs ordered are listed, but only abnormal results are displayed) Labs Reviewed  CBC WITH DIFFERENTIAL/PLATELET - Abnormal; Notable for the following components:      Result Value   RDW 18.2 (*)    All other components within normal limits  BASIC METABOLIC PANEL - Abnormal; Notable for the following components:   CO2 21 (*)    Glucose, Bld 181 (*)    Creatinine, Ser 2.05 (*)    Calcium 8.4 (*)    GFR calc non Af Amer 38 (*)    GFR calc Af Amer 44 (*)    All other components within normal limits  BRAIN NATRIURETIC PEPTIDE - Abnormal; Notable for the following components:   B Natriuretic Peptide 237.0 (*)    All other components within normal limits  I-STAT CHEM 8, ED - Abnormal; Notable for the following components:   BUN 21 (*)    Creatinine, Ser 1.60 (*)    Calcium, Ion 1.05 (*)    All other components within normal limits  MAGNESIUM    EKG EKG Interpretation  Date/Time:  Friday  February 22 2018 06:27:37 EDT Ventricular Rate:  63 PR Interval:    QRS Duration: 91 QT Interval:  399 QTC Calculation: 409 R Axis:   55 Text Interpretation:  Sinus rhythm Borderline T abnormalities, lateral leads Since last EKG, bigeminy has resolved Confirmed by Duffy Bruce [phone]) on 02/22/2018 8:04:26 AM   Radiology Dg Chest Port 1 View  Result Date: 02/22/2018 CLINICAL DATA:  Chest pain.  Atrial fibrillation. EXAM: PORTABLE CHEST 1 VIEW COMPARISON:  01/15/2018 FINDINGS: The heart size and mediastinal contours are within normal limits. Both lungs are clear. The visualized skeletal structures are unremarkable. IMPRESSION: No active disease. Electronically Signed   By: Lucienne Capers M.D.   On: 02/22/2018 06:13    Procedures .Critical Care Performed by: Duffy Bruce, MD Authorized by: Duffy Bruce, MD   Critical care provider statement:    Critical care time (minutes):  45   Critical care time was exclusive of:  Separately billable procedures and treating other patients and teaching time   Critical care was necessary to treat or prevent imminent or life-threatening deterioration of the following conditions:  Cardiac failure and circulatory failure   Critical care was time spent personally by me on the following activities:  Development of treatment plan with patient or surrogate, discussions with consultants, evaluation of patient's response to treatment, examination of patient, obtaining history from patient or surrogate, ordering and performing treatments and interventions, ordering and review of laboratory studies, ordering and review of radiographic studies, pulse oximetry, re-evaluation of patient's condition and review of old charts   I assumed direction of critical care for this patient from another provider in my specialty: no   .Cardioversion Date/Time: 02/22/2018 8:12 AM Performed by: Duffy Bruce, MD Authorized by: Duffy Bruce, MD   Consent:    Consent  obtained:  Verbal and emergent situation   Consent given by:  Patient   Risks discussed:  Cutaneous burn, death, pain and induced arrhythmia   Alternatives discussed:  Alternative treatment and rate-control medication Pre-procedure details:    Cardioversion basis:  Emergent   Rhythm:  Atrial fibrillation   Electrode placement:  Anterior-posterior Patient sedated: Yes. Refer to sedation procedure documentation for details of sedation.  Attempt one:    Cardioversion mode:  Synchronous   Waveform:  Biphasic   Shock (Joules):  120   Shock  outcome:  Conversion to normal sinus rhythm Post-procedure details:    Patient status:  Awake   Patient tolerance of procedure:  Tolerated well, no immediate complications .Sedation Date/Time: 02/22/2018 8:12 AM Performed by: Duffy Bruce, MD Authorized by: Duffy Bruce, MD   Consent:    Consent obtained:  Verbal   Consent given by:  Patient   Risks discussed:  Allergic reaction, dysrhythmia, inadequate sedation, nausea, prolonged hypoxia resulting in organ damage, prolonged sedation necessitating reversal, respiratory compromise necessitating ventilatory assistance and intubation and vomiting   Alternatives discussed:  Analgesia without sedation, anxiolysis and regional anesthesia Universal protocol:    Procedure explained and questions answered to patient or proxy's satisfaction: yes     Relevant documents present and verified: yes     Test results available and properly labeled: yes     Imaging studies available: yes     Required blood products, implants, devices, and special equipment available: yes     Site/side marked: yes     Immediately prior to procedure a time out was called: yes     Patient identity confirmation method:  Verbally with patient Indications:    Procedure necessitating sedation performed by:  Physician performing sedation   Intended level of sedation:  Deep and moderate (conscious sedation) Pre-sedation assessment:     Time since last food or drink:  4   ASA classification: class 1 - normal, healthy patient     Neck mobility: normal     Mouth opening:  3 or more finger widths   Thyromental distance:  4 finger widths   Mallampati score:  I - soft palate, uvula, fauces, pillars visible   Pre-sedation assessments completed and reviewed: airway patency, cardiovascular function, hydration status, mental status, nausea/vomiting, pain level, respiratory function and temperature   Immediate pre-procedure details:    Reassessment: Patient reassessed immediately prior to procedure     Reviewed: vital signs, relevant labs/tests and NPO status     Verified: bag valve mask available, emergency equipment available, intubation equipment available, IV patency confirmed, oxygen available and suction available   Procedure details (see MAR for exact dosages):    Preoxygenation:  Nasal cannula   Sedation:  Etomidate   Intra-procedure monitoring:  Blood pressure monitoring, cardiac monitor, continuous pulse oximetry, frequent LOC assessments, frequent vital sign checks and continuous capnometry   Intra-procedure events: none     Total Provider sedation time (minutes):  15 Post-procedure details:    Attendance: Constant attendance by certified staff until patient recovered     Recovery: Patient returned to pre-procedure baseline     Post-sedation assessments completed and reviewed: airway patency, cardiovascular function, hydration status, mental status, nausea/vomiting, pain level, respiratory function and temperature     Patient is stable for discharge or admission: yes     Patient tolerance:  Tolerated well, no immediate complications   (including critical care time)  Medications Ordered in ED Medications  etomidate (AMIDATE) 2 MG/ML injection (has no administration in time range)  sodium chloride 0.9 % bolus 500 mL (0 mLs Intravenous Stopped 02/22/18 0627)  sodium chloride 0.9 % bolus 1,000 mL (0 mLs Intravenous  Stopped 02/22/18 0727)  flecainide (TAMBOCOR) tablet 100 mg (100 mg Oral Given 02/22/18 0810)     Initial Impression / Assessment and Plan / ED Course  I have reviewed the triage vital signs and the nursing notes.  Pertinent labs & imaging results that were available during my care of the patient were reviewed by me and considered in my  medical decision making (see chart for details).     42 year old male here with A. fib RVR.  On arrival, patient hypotensive and tachycardic.  I was able to break him into a flutter versus A. fib with variable response with vagal maneuvers, but he remained tachycardic and symptomatic.  Patient subsequently sedated and cardioverted.  He remains in normal sinus rhythm and sinus bradycardia after cardioversion.  Lab work shows mild AKI which is resolved with fluids I suspect this is due to hypoperfusion in the setting of his A. fib.  Discussed the case with Dr. Ellyn Hack of cardiology.  Will increase his flecainide dose.  Otherwise, he is asymptomatic now very well-appearing.  Will discharge with outpatient cardiology follow-up.  He is been adherent with his Xarelto.  CHA2Ds2-VASc Score for Atrial Fibrillation    Patient Score  Age <65 = 0 65-74 = 1 > 75 = 2 0  Sex Male = 0 Male = 1 0  CHF History No = 0  Yes = 1 0  HTN History No = 0  Yes = 1 1  Stroke/TIA/TE History No = 0  Yes = 1 0  Vascular Disease History No = 0  Yes = 1 1  Diabetes History No = 0  Yes = 1 0  Total:  2   2.2 % stroke rate/year from a score of 2   Final Clinical Impressions(s) / ED Diagnoses   Final diagnoses:  Atrial fibrillation with RVR [facility])    ED Discharge Orders        Ordered    flecainide (TAMBOCOR) 50 MG tablet  2 times daily     02/22/18 0756       Duffy Bruce, MD 02/22/18 8413    Duffy Bruce, MD 02/22/18 [id number]

## 2018-02-22 NOTE — Telephone Encounter (Signed)
Pt calling    *STAT* If patient is at the pharmacy, call can be transferred to refill team.   1. Which medications need to be refilled? (please list name of each medication and dose if known)  Xarelto 20mg    2. Which pharmacy/location (including street and city if local pharmacy) is medication to be sent to? [facility]   3. Do they need a 30 day or 90 day supply? 24  Pt stated the pharmacy has been faxing over request but no answer from [place]. Pt stated it was 2 more medications but he didn't know the name,

## 2018-02-22 NOTE — Sedation Documentation (Signed)
Patient in NS

## 2018-02-22 NOTE — Sedation Documentation (Signed)
Patient shocked 

## 2018-02-22 NOTE — Sedation Documentation (Signed)
20mg  atomadate given.

## 2018-02-22 NOTE — ED Triage Notes (Signed)
Patient states that he has been in Afib all day; diaphoretic, cool and clammy.

## 2018-02-22 NOTE — Discharge Instructions (Addendum)
We are INCREASING your flecainide dose to help with your atrial fibrillation.  Instead of 50 mg (one tablet) twice a day, start taking 100 mg (two tablets) twice a day.  I provided a new prescription so that you have enough pills - make sure that you do not accidentally take both the prescriptions  If you start to feel palpitations, you can take an extra dose of flecainide 100 mg once, and call your Cardiologist

## 2018-03-11 ENCOUNTER — Ambulatory Visit: Payer: PRIVATE HEALTH INSURANCE | Admitting: Physician Assistant

## 2018-03-22 ENCOUNTER — Other Ambulatory Visit: Payer: Self-pay | Admitting: Cardiovascular Disease

## 2018-03-24 ENCOUNTER — Inpatient Hospital Stay (HOSPITAL_COMMUNITY)
Admission: EM | Admit: 2018-03-24 | Discharge: 2018-03-26 | DRG: 310 | Disposition: A | Discharge status: Home / Self Care | Payer: PRIVATE HEALTH INSURANCE | Attending: Internal Medicine | Admitting: Internal Medicine

## 2018-03-24 ENCOUNTER — Encounter (HOSPITAL_COMMUNITY): Payer: Self-pay | Admitting: Emergency Medicine

## 2018-03-24 ENCOUNTER — Emergency Department (HOSPITAL_COMMUNITY): Payer: PRIVATE HEALTH INSURANCE

## 2018-03-24 ENCOUNTER — Other Ambulatory Visit: Payer: Self-pay

## 2018-03-24 DIAGNOSIS — Z6839 Body mass index (BMI) 39.0-39.9, adult: Secondary | ICD-10-CM

## 2018-03-24 DIAGNOSIS — I471 Supraventricular tachycardia: Secondary | ICD-10-CM

## 2018-03-24 DIAGNOSIS — R079 Chest pain, unspecified: Secondary | ICD-10-CM

## 2018-03-24 DIAGNOSIS — I481 Persistent atrial fibrillation: Secondary | ICD-10-CM | POA: Diagnosis present

## 2018-03-24 DIAGNOSIS — N189 Chronic kidney disease, unspecified: Secondary | ICD-10-CM | POA: Diagnosis present

## 2018-03-24 DIAGNOSIS — G4733 Obstructive sleep apnea (adult) (pediatric): Secondary | ICD-10-CM | POA: Diagnosis present

## 2018-03-24 DIAGNOSIS — Z7901 Long term (current) use of anticoagulants: Secondary | ICD-10-CM | POA: Diagnosis not present

## 2018-03-24 DIAGNOSIS — I480 Paroxysmal atrial fibrillation: Principal | ICD-10-CM | POA: Diagnosis present

## 2018-03-24 DIAGNOSIS — Z8249 Family history of ischemic heart disease and other diseases of the circulatory system: Secondary | ICD-10-CM

## 2018-03-24 DIAGNOSIS — I10 Essential (primary) hypertension: Secondary | ICD-10-CM | POA: Diagnosis not present

## 2018-03-24 DIAGNOSIS — I4892 Unspecified atrial flutter: Secondary | ICD-10-CM | POA: Diagnosis present

## 2018-03-24 DIAGNOSIS — I483 Typical atrial flutter: Secondary | ICD-10-CM | POA: Diagnosis not present

## 2018-03-24 DIAGNOSIS — Z9114 Patient's other noncompliance with medication regimen: Secondary | ICD-10-CM

## 2018-03-24 DIAGNOSIS — Z87891 Personal history of nicotine dependence: Secondary | ICD-10-CM

## 2018-03-24 DIAGNOSIS — J810 Acute pulmonary edema: Secondary | ICD-10-CM

## 2018-03-24 DIAGNOSIS — I1310 Hypertensive heart and chronic kidney disease without heart failure, with stage 1 through stage 4 chronic kidney disease, or unspecified chronic kidney disease: Secondary | ICD-10-CM | POA: Diagnosis present

## 2018-03-24 LAB — CBC WITH DIFFERENTIAL/PLATELET
Abs Immature Granulocytes: 0.1 10*3/uL (ref 0.0–0.1)
BASOS PCT: 1 %
Basophils Absolute: 0.1 10*3/uL (ref 0.0–0.1)
EOS ABS: 0.2 10*3/uL (ref 0.0–0.7)
Eosinophils Relative: 2 %
HCT: 48.7 % (ref 39.0–52.0)
Hemoglobin: 15.7 g/dL (ref 13.0–17.0)
Immature Granulocytes: 1 %
LYMPHS ABS: 2.4 10*3/uL (ref 0.7–4.0)
LYMPHS PCT: 24 %
MCH: 27.8 pg (ref 26.0–34.0)
MCHC: 32.2 g/dL (ref 30.0–36.0)
MCV: 86.3 fL (ref 78.0–100.0)
MONO ABS: 0.5 10*3/uL (ref 0.1–1.0)
MONOS PCT: 5 %
Neutro Abs: 6.8 10*3/uL (ref 1.7–7.7)
Neutrophils Relative %: 67 %
PLATELETS: 290 10*3/uL (ref 150–400)
RBC: 5.64 MIL/uL (ref 4.22–5.81)
RDW: 19 % — ABNORMAL HIGH (ref 11.5–15.5)
WBC: 10 10*3/uL (ref 4.0–10.5)

## 2018-03-24 LAB — BASIC METABOLIC PANEL
ANION GAP: 9 (ref 5–15)
ANION GAP: 9 (ref 5–15)
BUN: 13 mg/dL (ref 6–20)
BUN: 14 mg/dL (ref 6–20)
CHLORIDE: 107 mmol/L (ref 98–111)
CHLORIDE: 108 mmol/L (ref 98–111)
CO2: 24 mmol/L (ref 22–32)
CO2: 25 mmol/L (ref 22–32)
CREATININE: 1.6 mg/dL — AB (ref 0.61–1.24)
Calcium: 8.6 mg/dL — ABNORMAL LOW (ref 8.9–10.3)
Calcium: 8.8 mg/dL — ABNORMAL LOW (ref 8.9–10.3)
Creatinine, Ser: 1.62 mg/dL — ABNORMAL HIGH (ref 0.61–1.24)
GFR calc non Af Amer: 51 mL/min — ABNORMAL LOW (ref >60)
GFR calc non Af Amer: 50 mL/min — ABNORMAL LOW (ref >60)
GFR, EST AFRICAN AMERICAN: 59 mL/min — AB (ref >60)
GFR, EST AFRICAN AMERICAN: 59 mL/min — AB (ref >60)
GLUCOSE: 142 mg/dL — AB (ref 70–99)
Glucose, Bld: 171 mg/dL — ABNORMAL HIGH (ref 70–99)
POTASSIUM: 4.7 mmol/L (ref 3.5–5.1)
Potassium: 4.6 mmol/L (ref 3.5–5.1)
SODIUM: 141 mmol/L (ref 135–145)
Sodium: 141 mmol/L (ref 135–145)

## 2018-03-24 LAB — HEMOGLOBIN A1C
HEMOGLOBIN A1C: 5.7 % — AB (ref 4.8–5.6)
MEAN PLASMA GLUCOSE: 116.89 mg/dL

## 2018-03-24 LAB — I-STAT CHEM 8, ED
BUN: 17 mg/dL (ref 6–20)
CALCIUM ION: 1.06 mmol/L — AB (ref 1.15–1.40)
CHLORIDE: 106 mmol/L (ref 98–111)
Creatinine, Ser: 1.6 mg/dL — ABNORMAL HIGH (ref 0.61–1.24)
GLUCOSE: 140 mg/dL — AB (ref 70–99)
HCT: 50 % (ref 39.0–52.0)
Hemoglobin: 17 g/dL (ref 13.0–17.0)
Potassium: 4.6 mmol/L (ref 3.5–5.1)
Sodium: 141 mmol/L (ref 135–145)
TCO2: 24 mmol/L (ref 22–32)

## 2018-03-24 LAB — BRAIN NATRIURETIC PEPTIDE: B Natriuretic Peptide: 290.1 pg/mL — ABNORMAL HIGH (ref 0.0–100.0)

## 2018-03-24 LAB — PROTIME-INR
INR: 1.17
PROTHROMBIN TIME: 14.8 s (ref 11.4–15.2)

## 2018-03-24 LAB — I-STAT TROPONIN, ED: TROPONIN I, POC: 0.11 ng/mL — AB (ref 0.00–0.08)

## 2018-03-24 LAB — MRSA PCR SCREENING: MRSA by PCR: NEGATIVE

## 2018-03-24 LAB — TSH: TSH: 3.097 u[IU]/mL (ref 0.350–4.500)

## 2018-03-24 MED ORDER — IBUPROFEN 100 MG PO CHEW
200.0000 mg | CHEWABLE_TABLET | Freq: Three times a day (TID) | ORAL | Status: DC | PRN
  Administered 2018-03-24: 200 mg via ORAL
  Filled 2018-03-24 (×2): qty 2

## 2018-03-24 MED ORDER — ALLOPURINOL 100 MG PO TABS
100.0000 mg | ORAL_TABLET | Freq: Every day | ORAL | Status: DC
  Administered 2018-03-24 – 2018-03-26 (×3): 100 mg via ORAL
  Filled 2018-03-24 (×3): qty 1

## 2018-03-24 MED ORDER — PANTOPRAZOLE SODIUM 40 MG PO TBEC
40.0000 mg | DELAYED_RELEASE_TABLET | Freq: Every day | ORAL | Status: DC
  Administered 2018-03-24 – 2018-03-26 (×3): 40 mg via ORAL
  Filled 2018-03-24 (×3): qty 1

## 2018-03-24 MED ORDER — AMIODARONE HCL IN DEXTROSE 360-4.14 MG/200ML-% IV SOLN
INTRAVENOUS | Status: AC
  Filled 2018-03-24: qty 200

## 2018-03-24 MED ORDER — AMIODARONE HCL IN DEXTROSE 360-4.14 MG/200ML-% IV SOLN
30.0000 mg/h | INTRAVENOUS | Status: DC
  Administered 2018-03-24: 30 mg/h via INTRAVENOUS

## 2018-03-24 MED ORDER — AMIODARONE HCL IN DEXTROSE 360-4.14 MG/200ML-% IV SOLN
60.0000 mg/h | Freq: Once | INTRAVENOUS | Status: AC
  Administered 2018-03-24: 60 mg/h via INTRAVENOUS
  Administered 2018-03-24: 01:00:00 via INTRAVENOUS

## 2018-03-24 MED ORDER — AMIODARONE HCL IN DEXTROSE 360-4.14 MG/200ML-% IV SOLN: 30.0000 mg/h | INTRAVENOUS | Status: DC

## 2018-03-24 MED ORDER — METOPROLOL SUCCINATE ER 25 MG PO TB24
25.0000 mg | ORAL_TABLET | Freq: Every day | ORAL | Status: DC
  Administered 2018-03-24 – 2018-03-26 (×3): 25 mg via ORAL
  Filled 2018-03-24 (×3): qty 1

## 2018-03-24 MED ORDER — ADENOSINE 6 MG/2ML IV SOLN
INTRAVENOUS | Status: AC
  Administered 2018-03-24: 6 mg
  Filled 2018-03-24: qty 4

## 2018-03-24 MED ORDER — ADENOSINE 6 MG/2ML IV SOLN
INTRAVENOUS | Status: AC
  Administered 2018-03-24: 12 mg
  Filled 2018-03-24: qty 4

## 2018-03-24 MED ORDER — AMIODARONE HCL 200 MG PO TABS
200.0000 mg | ORAL_TABLET | Freq: Two times a day (BID) | ORAL | Status: DC
  Administered 2018-03-24 – 2018-03-26 (×4): 200 mg via ORAL
  Filled 2018-03-24 (×4): qty 1

## 2018-03-24 MED ORDER — DICYCLOMINE HCL 20 MG PO TABS: 20.0000 mg | ORAL_TABLET | Freq: Three times a day (TID) | ORAL | Status: DC | PRN

## 2018-03-24 MED ORDER — LISINOPRIL 20 MG PO TABS
20.0000 mg | ORAL_TABLET | Freq: Every day | ORAL | Status: DC
  Administered 2018-03-24 – 2018-03-26 (×3): 20 mg via ORAL
  Filled 2018-03-24 (×3): qty 1

## 2018-03-24 MED ORDER — ADENOSINE 6 MG/2ML IV SOLN
INTRAVENOUS | Status: AC
  Administered 2018-03-24: 12 mg
  Filled 2018-03-24: qty 2

## 2018-03-24 MED ORDER — ACETAMINOPHEN 325 MG PO TABS
650.0000 mg | ORAL_TABLET | ORAL | Status: DC | PRN
  Administered 2018-03-24: 650 mg via ORAL
  Filled 2018-03-24: qty 2

## 2018-03-24 MED ORDER — AMIODARONE HCL IN DEXTROSE 360-4.14 MG/200ML-% IV SOLN
INTRAVENOUS | Status: AC
  Administered 2018-03-24: 60 mg/h via INTRAVENOUS
  Filled 2018-03-24: qty 200

## 2018-03-24 MED ORDER — ONDANSETRON HCL 4 MG/2ML IJ SOLN: 4.0000 mg | Freq: Four times a day (QID) | INTRAMUSCULAR | Status: DC | PRN

## 2018-03-24 MED ORDER — NITROGLYCERIN 2 % TD OINT
1.0000 [in_us] | TOPICAL_OINTMENT | Freq: Once | TRANSDERMAL | Status: AC
  Administered 2018-03-24: 1 [in_us] via TOPICAL
  Filled 2018-03-24: qty 1

## 2018-03-24 MED ORDER — AMIODARONE LOAD VIA INFUSION
150.0000 mg | Freq: Once | INTRAVENOUS | Status: DC
  Filled 2018-03-24: qty 83.34

## 2018-03-24 MED ORDER — FUROSEMIDE 10 MG/ML IJ SOLN
40.0000 mg | Freq: Once | INTRAMUSCULAR | Status: AC
  Administered 2018-03-24: 40 mg via INTRAVENOUS
  Filled 2018-03-24: qty 4

## 2018-03-24 MED ORDER — RIVAROXABAN 20 MG PO TABS
20.0000 mg | ORAL_TABLET | Freq: Every day | ORAL | Status: DC
  Administered 2018-03-24 – 2018-03-25 (×2): 20 mg via ORAL
  Filled 2018-03-24 (×2): qty 1

## 2018-03-24 NOTE — ED Provider Notes (Addendum)
[facility] EMERGENCY DEPARTMENT Provider Note   CSN: [id number] Arrival date & time: 03/24/18  0044     History   Chief Complaint Chief Complaint  Patient presents with  . Atrial Fibrillation    HPI Samuel Flynn is a 42 y.o. male.  The history is provided by the spouse. The history is limited by the condition of the patient.  Palpitations   This is a recurrent problem. The current episode started 3 to 5 hours ago. The problem occurs constantly. The problem has not changed since onset.The problem is associated with an unknown factor. On average, each episode lasts 4 hours. Associated symptoms include diaphoresis, chest pressure, orthopnea and shortness of breath. Pertinent negatives include no fever, no numbness, no syncope, no nausea, no vomiting, no back pain, no leg pain and no sputum production. He has tried nothing for the symptoms. The treatment provided no relief. Risk factors include being male. His past medical history does not include anemia or valve disorder.  Patient with h/o afib presents with multiple episodes of sudden onset palpitation (6 times a week) since last cardioversion.  Made worse on Flecainide.  No f/c/r.  Tonight's episode is the worst with worst SOB.  No n/v/.    Past Medical History:  Diagnosis Date  . Atrial fibrillation with RVR ([place])   . Cellulitis 04/2016   RT FOOT  . Gastric ulcer   . Gout   . Hypertension   . Paroxysmal A-fib ([place])   . Sleep apnea    USES CPAP  . Wears glasses     Patient Active Problem List   Diagnosis Date Noted  . Atrial fibrillation, rapid ([place]) 01/15/2018  . Failure to attend appointment 04/25/2016  . Cellulitis of right foot 04/13/2016  . Atrial fibrillation with RVR ([place]) 04/10/2016  . Cellulitis of foot, right 04/10/2016  . Elevated troponin I level 04/10/2016  . Atrial fibrillation with rapid ventricular response ([place]) 10/18/2015  . Fracture of coronoid process of ulna, right, closed  09/21/2014  . Essential hypertension, benign 02/19/2014  . DUODENAL ULCER 09/18/2007  . RENAL CYST, LEFT 09/06/2007  . FLANK PAIN, RIGHT 08/26/2007  . PAIN IN THORACIC SPINE 02/18/2007  . BACK PAIN, LUMBAR 01/09/2007  . SYNCOPE 01/03/2007  . Gout 12/27/2006  . MORBID OBESITY 12/27/2006  . CARDIAC ARRHYTHMIA 12/27/2006  . SYMPTOM, DISTURBANCE OF SKIN SENSATION 12/27/2006    Past Surgical History:  Procedure Laterality Date  . CARDIOVERSION N/A 01/18/2018   Procedure: CARDIOVERSION;  Surgeon: Larey Dresser, MD;  Location: [facility] ENDOSCOPY;  Service: Cardiovascular;  Laterality: N/A;  . ELBOW LIGAMENT RECONSTRUCTION Right 09/29/2014   Procedure: Merian Capron FRACTURE FIXATION, POSSIBLE LIGAMENT REPAIR;  Surgeon: Nita Sells, MD;  Location: [facility];  Service: Orthopedics;  Laterality: Right;  Right elbow coranoid fracture fixation, possible ligament repair  . TEE WITHOUT CARDIOVERSION N/A 01/18/2018   Procedure: TRANSESOPHAGEAL ECHOCARDIOGRAM (TEE);  Surgeon: Larey Dresser, MD;  Location: [facility] ENDOSCOPY;  Service: Cardiovascular;  Laterality: N/A;  . WISDOM TOOTH EXTRACTION          Home Medications    Prior to Admission medications   Medication Sig Start Date End Date Taking? Authorizing Provider  allopurinol (ZYLOPRIM) 100 MG tablet Take 100 mg by mouth daily. For gout   Yes [provider]  colchicine 0.6 MG tablet Take 0.6 mg by mouth daily as needed (gout flare).  08/20/15  Yes [provider]  dicyclomine (BENTYL) 20 MG tablet Take  20 mg by mouth 3 (three) times daily as needed for spasms.    Yes [provider]  flecainide (TAMBOCOR) 50 MG tablet Take 2 tablets (100 mg total) by mouth 2 (two) times daily. 02/22/18 03/24/18 Yes Barrett, Evelene Croon, PA-C  ibuprofen (ADVIL,MOTRIN) 200 MG tablet Take 800 mg by mouth every 6 (six) hours as needed for headache or moderate pain.   Yes [provider]  lisinopril  (PRINIVIL,ZESTRIL) 20 MG tablet Take 1 tablet (20 mg total) by mouth daily. For blood pressure 02/22/18  Yes Barrett, Evelene Croon, PA-C  metoprolol succinate (TOPROL-XL) 25 MG 24 hr tablet Take 1 tablet (25 mg total) by mouth daily. 02/22/18  Yes Barrett, Evelene Croon, PA-C  Omega-3 Fatty Acids (FISH OIL PO) Take 1 tablet by mouth daily.   Yes [provider]  pantoprazole (PROTONIX) 40 MG tablet Take 1 tablet (40 mg total) by mouth daily. NEED OV. 02/22/18  Yes Barrett, Rhonda G, PA-C  XARELTO 20 MG TABS tablet TAKE 1 TABLET (20 MG TOTAL) BY MOUTH DAILY WITH SUPPER. 03/22/18  Yes Crenshaw, Denice Bors, MD    Family History Family History  Problem Relation Age of Onset  . Hypertension Paternal Uncle   . Hypertension Maternal Grandmother   . Hypertension Paternal Uncle   . Hypertension Paternal Uncle   . Hypertension Paternal Uncle   . Hypertension Paternal Uncle     Social History Social History   Tobacco Use  . Smoking status: Former Smoker    Types: Cigars    Last attempt to quit: 02/03/2016    Years since quitting: 2.1  . Smokeless tobacco: Never Used  Substance Use Topics  . Alcohol use: Yes    Alcohol/week: 0.0 oz    Comment: occ  . Drug use: No     Allergies   Patient has no known allergies.   Review of Systems Review of Systems  Unable to perform ROS: Acuity of condition  Constitutional: Positive for diaphoresis. Negative for fever.  Respiratory: Positive for chest tightness and shortness of breath. Negative for sputum production.   Cardiovascular: Positive for palpitations and orthopnea. Negative for leg swelling and syncope.  Gastrointestinal: Negative for nausea and vomiting.  Musculoskeletal: Negative for back pain.  Neurological: Negative for numbness.     Physical Exam Updated Vital Signs BP (!) 149/97   Pulse (!) 112   Resp (!) 26   Ht 6\' 2"  (1.88 m)   Wt 136.1 kg (300 lb)   SpO2 100%   BMI 38.52 kg/m   Physical Exam  Constitutional: He appears  well-developed and well-nourished. He appears distressed.  HENT:  Head: Normocephalic and atraumatic.  Mouth/Throat: No oropharyngeal exudate.  Eyes: Pupils are equal, round, and reactive to light. Conjunctivae are normal.  Neck: No tracheal deviation present.  Cardiovascular: Regular rhythm, normal heart sounds and intact distal pulses. Tachycardia present.  Pulmonary/Chest: Bradypnea noted. He is in respiratory distress. He has wheezes.  Abdominal: Soft. Bowel sounds are normal. There is no tenderness.  Musculoskeletal: Normal range of motion. He exhibits no edema.  Neurological: He displays normal reflexes.  Skin: Capillary refill takes less than 2 seconds. He is diaphoretic.     ED Treatments / Results  Labs (all labs ordered are listed, but only abnormal results are displayed) Results for orders placed or performed during the hospital encounter of 18/84/16  Basic metabolic panel  Result Value Ref Range   Sodium 141 135 - 145 mmol/L   Potassium 4.6 3.5 -  5.1 mmol/L   Chloride 108 98 - 111 mmol/L   CO2 24 22 - 32 mmol/L   Glucose, Bld 171 (H) 70 - 99 mg/dL   BUN 13 6 - 20 mg/dL   Creatinine, Ser 1.60 (H) 0.61 - 1.24 mg/dL   Calcium 8.6 (L) 8.9 - 10.3 mg/dL   GFR calc non Af Amer 51 (L) >60 mL/min   GFR calc Af Amer 59 (L) >60 mL/min   Anion gap 9 5 - 15  CBC with Differential/Platelet  Result Value Ref Range   WBC 10.0 4.0 - 10.5 K/uL   RBC 5.64 4.22 - 5.81 MIL/uL   Hemoglobin 15.7 13.0 - 17.0 g/dL   HCT 48.7 39.0 - 52.0 %   MCV 86.3 78.0 - 100.0 fL   MCH 27.8 26.0 - 34.0 pg   MCHC 32.2 30.0 - 36.0 g/dL   RDW 19.0 (H) 11.5 - 15.5 %   Platelets 290 150 - 400 K/uL   Neutrophils Relative % 67 %   Neutro Abs 6.8 1.7 - 7.7 K/uL   Lymphocytes Relative 24 %   Lymphs Abs 2.4 0.7 - 4.0 K/uL   Monocytes Relative 5 %   Monocytes Absolute 0.5 0.1 - 1.0 K/uL   Eosinophils Relative 2 %   Eosinophils Absolute 0.2 0.0 - 0.7 K/uL   Basophils Relative 1 %   Basophils Absolute 0.1  0.0 - 0.1 K/uL   Immature Granulocytes 1 %   Abs Immature Granulocytes 0.1 0.0 - 0.1 K/uL  Brain natriuretic peptide  Result Value Ref Range   B Natriuretic Peptide 290.1 (H) 0.0 - 100.0 pg/mL  Protime-INR  Result Value Ref Range   Prothrombin Time 14.8 11.4 - 15.2 seconds   INR 1.17   I-Stat Chem 8, ED  Result Value Ref Range   Sodium 141 135 - 145 mmol/L   Potassium 4.6 3.5 - 5.1 mmol/L   Chloride 106 98 - 111 mmol/L   BUN 17 6 - 20 mg/dL   Creatinine, Ser 1.60 (H) 0.61 - 1.24 mg/dL   Glucose, Bld 140 (H) 70 - 99 mg/dL   Calcium, Ion 1.06 (L) 1.15 - 1.40 mmol/L   TCO2 24 22 - 32 mmol/L   Hemoglobin 17.0 13.0 - 17.0 g/dL   HCT 50.0 39.0 - 52.0 %  I-stat troponin, ED  Result Value Ref Range   Troponin i, poc 0.11 (HH) 0.00 - 0.08 ng/mL   Comment NOTIFIED PHYSICIAN    Comment 3           Dg Chest Port 1 View  Result Date: 03/24/2018 CLINICAL DATA:  Atrial fibrillation all week with chest pain. Dyspnea. EXAM: PORTABLE CHEST 1 VIEW COMPARISON:  Priors dating back to 04/10/2016 FINDINGS: Borderline cardiomegaly with aortic atherosclerosis. Mild diffuse interstitial edema. No pulmonary consolidation. External defibrillator paddles project over the left hemithorax. No acute osseous abnormality. IMPRESSION: Stable mild cardiomegaly with aortic atherosclerosis. Mild increase in interstitial edema since priors. Electronically Signed   By: Ashley Royalty M.D.   On: 03/24/2018 01:46   Dg Chest Port 1 View  Result Date: 02/22/2018 CLINICAL DATA:  Chest pain.  Atrial fibrillation. EXAM: PORTABLE CHEST 1 VIEW COMPARISON:  01/15/2018 FINDINGS: The heart size and mediastinal contours are within normal limits. Both lungs are clear. The visualized skeletal structures are unremarkable. IMPRESSION: No active disease. Electronically Signed   By: Lucienne Capers M.D.   On: 02/22/2018 06:13    EKG EKG Interpretation  Date/Time:  Sunday March 24 2018  01:37:17 EDT Ventricular Rate:  112 PR Interval:      QRS Duration: 95 QT Interval:  328 QTC Calculation: 448 R Axis:   73 Text Interpretation:  Atrial flutter with predominant 2:1 AV block Ventricular bigeminy Repol abnrm, severe global ischemia (LM/MVD) Confirmed by Randal Buba, Catera Hankins ([id number]) on 03/24/2018 1:51:51 AM   EKG Interpretation  Date/Time:  Sunday March 24 2018 02:57:37 EDT Ventricular Rate:  72 PR Interval:    QRS Duration: 91 QT Interval:  367 QTC Calculation: 402 R Axis:   59 Text Interpretation:  Sinus rhythm Consider left atrial enlargement Confirmed by Dory Horn) on 03/24/2018 3:00:27 AM        Radiology Dg Chest Port 1 View  Result Date: 03/24/2018 CLINICAL DATA:  Atrial fibrillation all week with chest pain. Dyspnea. EXAM: PORTABLE CHEST 1 VIEW COMPARISON:  Priors dating back to 04/10/2016 FINDINGS: Borderline cardiomegaly with aortic atherosclerosis. Mild diffuse interstitial edema. No pulmonary consolidation. External defibrillator paddles project over the left hemithorax. No acute osseous abnormality. IMPRESSION: Stable mild cardiomegaly with aortic atherosclerosis. Mild increase in interstitial edema since priors. Electronically Signed   By: Ashley Royalty M.D.   On: 03/24/2018 01:46    Procedures Procedures (including critical care time)  Medications Ordered in ED Medications  adenosine (ADENOCARD) 6 MG/2ML injection (6 mg  Given 03/24/18 0105)  adenosine (ADENOCARD) 6 MG/2ML injection (12 mg  Given 03/24/18 0107)  adenosine (ADENOCARD) 6 MG/2ML injection (12 mg  Given 03/24/18 0112)  furosemide (LASIX) injection 40 mg (40 mg Intravenous Given 03/24/18 0135)  nitroGLYCERIN (NITROGLYN) 2 % ointment 1 inch (1 inch Topical Given 03/24/18 0136)  amiodarone (NEXTERONE PREMIX) 360-4.14 MG/200ML-% (1.8 mg/mL) IV infusion (60 mg/hr Intravenous Rate/Dose Change 03/24/18 0139)    BIPAP initiated by me    MDM Reviewed: previous chart, nursing note and vitals Reviewed previous: labs and ECG Interpretation: labs,  ECG and x-ray (elevated BNP and troponin, multiple EKGs reviewed.  ) Total time providing critical care: > 105 minutes (bipap and adenosine and amiodarone). This excludes time spent performing separately reportable procedures and services. Consults: cardiology  CRITICAL CARE Performed by: Carlisle Beers Total critical care time: 120 minutes Critical care time was exclusive of separately billable procedures and treating other patients. Critical care was necessary to treat or prevent imminent or life-threatening deterioration. Critical care was time spent personally by me on the following activities: development of treatment plan with patient and/or surrogate as well as nursing, discussions with consultants, evaluation of patient's response to treatment, examination of patient, obtaining history from patient or surrogate, ordering and performing treatments and interventions, ordering and review of laboratory studies, ordering and review of radiographic studies, pulse oximetry and re-evaluation of patient's condition. Final Clinical Impressions(s) / ED Diagnoses   Final diagnoses:  Acute pulmonary edema (HCC)  SVT (supraventricular tachycardia) (HCC)    Admit to stepdown cardiology, Diuresed 900 on lasix, BP and O2 saturation markedly improved post medications.     Mylo Choi, MD 03/24/18 Bliss, Ismaeel Arvelo, MD 03/24/18 [id number]

## 2018-03-24 NOTE — Progress Notes (Signed)
Noted Sinus Loletha Grayer, Amiodarone gtt stopped. EKG ordered. Will Continue to monitor

## 2018-03-24 NOTE — Progress Notes (Signed)
Pt placed on BIPAP at this time per order. Pt tolerating current settings. RT will continue to monitor.

## 2018-03-24 NOTE — ED Notes (Signed)
Pt off bipap by RT, pt placed on [place], pt reports feeling much better at this time

## 2018-03-24 NOTE — H&P (Addendum)
History & Physical    Patient ID: Samuel Flynn MRN: [id number], DOB/AGE: 1976-07-29   Admit date: 03/24/2018   Primary Physician: Helane Rima, MD Primary Cardiologist: Skeet Latch, MD  Patient Profile    Samuel Flynn  Past Medical History    Past Medical History:  Diagnosis Date  . Atrial fibrillation with RVR ([place])   . Cellulitis 04/2016   RT FOOT  . Gastric ulcer   . Gout   . Hypertension   . Paroxysmal A-fib ([place])   . Sleep apnea    USES CPAP  . Wears glasses     Past Surgical History:  Procedure Laterality Date  . CARDIOVERSION N/A 01/18/2018   Procedure: CARDIOVERSION;  Surgeon: Larey Dresser, MD;  Location: [facility] ENDOSCOPY;  Service: Cardiovascular;  Laterality: N/A;  . ELBOW LIGAMENT RECONSTRUCTION Right 09/29/2014   Procedure: Merian Capron FRACTURE FIXATION, POSSIBLE LIGAMENT REPAIR;  Surgeon: Nita Sells, MD;  Location: [facility];  Service: Orthopedics;  Laterality: Right;  Right elbow coranoid fracture fixation, possible ligament repair  . TEE WITHOUT CARDIOVERSION N/A 01/18/2018   Procedure: TRANSESOPHAGEAL ECHOCARDIOGRAM (TEE);  Surgeon: Larey Dresser, MD;  Location: [facility] ENDOSCOPY;  Service: Cardiovascular;  Laterality: N/A;  . 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 He presents the third time in 2 months for Fib/Samuel Flynn to the ED. He reports to have felt SOB today, noted to be tachycardic. He usually is not that SOB with his tachycardia episodes. Also he usally is not that fast. Today he was found to be 220, with a  Regular rate on presentation in the ED. He has previously been DCCV and has been on metop and flecainide. He reports compliance with riva and his bipap at home. No new meds or stressors recently.   In the ED  he was given adenosine without change in rhythm. Started on Amio (bolus once). He converted into a slower Samuel Flynn rate (~110). CXR with pulm edema. Started on Bipap and lasix.  Home Medications    Prior to Admission medications   Medication Sig Start Date End Date Taking? Authorizing Provider  allopurinol (ZYLOPRIM) 100 MG tablet Take 100 mg by mouth daily. For gout   Yes [provider]  colchicine 0.6 MG tablet Take 0.6 mg by mouth daily as needed (gout flare).  08/20/15  Yes [provider]  dicyclomine (BENTYL) 20 MG tablet Take 20 mg by mouth 3 (three) times daily as needed for spasms.    Yes [provider]  flecainide (TAMBOCOR) 50 MG tablet Take 2 tablets (100 mg total) by mouth 2 (two) times daily. 02/22/18 03/24/18 Yes Barrett, Evelene Croon, PA-C  ibuprofen (ADVIL,MOTRIN) 200 MG tablet Take 800 mg by mouth every 6 (six) hours as needed for headache or moderate pain.   Yes [provider]  lisinopril (PRINIVIL,ZESTRIL) 20 MG tablet Take 1 tablet (20 mg total) by mouth daily. For blood pressure 02/22/18  Yes Barrett, Evelene Croon, PA-C  metoprolol succinate (TOPROL-XL) 25 MG 24 hr tablet Take 1 tablet (25 mg total) by mouth daily. 02/22/18  Yes Barrett, Evelene Croon, PA-C  Omega-3 Fatty Acids (FISH OIL PO) Take 1 tablet by mouth daily.   Yes [provider]  pantoprazole (PROTONIX) 40 MG tablet Take 1 tablet (40 mg  total) by mouth daily. NEED OV. 02/22/18  Yes Barrett, Rhonda G, PA-C  XARELTO 20 MG TABS tablet TAKE 1 TABLET (20 MG TOTAL) BY MOUTH DAILY WITH SUPPER. 03/22/18  Yes Crenshaw, Denice Bors, MD    Family History    Family History  Problem Relation Age of Onset  . Hypertension Paternal Uncle   . Hypertension Maternal Grandmother   . Hypertension Paternal Uncle   . Hypertension Paternal Uncle   . Hypertension Paternal Uncle   . Hypertension Paternal Uncle    indicated that the status of his maternal grandmother is unknown.   Social History      Social History   Socioeconomic History  . Marital status: Married    Spouse name: Not on file  . Number of children: Not on file  . Years of education: Not on file  . Highest education level: Not on file  Occupational History  . Occupation: Truck Education administrator: [employer]  . Financial resource strain: Not on file  . Food insecurity:    Worry: Not on file    Inability: Not on file  . Transportation needs:    Medical: Not on file    Non-medical: Not on file  Tobacco Use  . Smoking status: Former Smoker    Types: Cigars    Last attempt to quit: 02/03/2016    Years since quitting: 2.1  . Smokeless tobacco: Never Used  Substance and Sexual Activity  . Alcohol use: Yes    Alcohol/week: 0.0 oz    Comment: occ  . Drug use: No  . Sexual activity: Yes    Comment: 5 a day  Lifestyle  . Physical activity:    Days per week: Not on file    Minutes per session: Not on file  . Stress: Not on file  Relationships  . Social connections:    Talks on phone: Not on file    Gets together: Not on file    Attends religious service: Not on file    Active member of club or organization: Not on file    Attends meetings of clubs or organizations: Not on file    Relationship status: Not on file  . Intimate partner violence:    Fear of current or ex partner: Not on file    Emotionally abused: Not on file    Physically abused: Not on file    Forced sexual activity: Not on file  Other Topics Concern  . Not on file  Social History Narrative  . Not on file     Review of Systems    General:  No chills, fever, night sweats or weight changes.  Cardiovascular:  No chest pain, dyspnea on exertion, edema, orthopnea, palpitations, paroxysmal nocturnal dyspnea. Dermatological: No rash, lesions/masses Respiratory: No cough, dyspnea Urologic: No hematuria, dysuria Abdominal:   No nausea, vomiting, diarrhea, bright red blood per rectum, melena, or hematemesis Neurologic:   No visual changes, wkns, changes in mental status. All other systems reviewed and are otherwise negative except as noted above.  Physical Exam    Blood pressure (!) 153/89, pulse (!) 110, resp. rate (!) 27, height 6\' 2"  (1.88 m), weight 136.1 kg (300 lb), SpO2 97 %.  General: in  Mild distress Psych: Normal affect. Neuro: Alert and oriented X 3. Moves all extremities spontaneously. HEENT: Normal  Neck: Supple without bruits or JVD. Lungs:  Resp regular and unlabored, CTA. Heart: RRR no s3, s4, or murmurs. Abdomen:  Soft, non-tender, non-distended, BS + x 4.  Extremities: No clubbing, cyanosis or edema. DP/PT/Radials 2+ and equal bilaterally.  Labs    Troponin [facility] of Care Test) Recent Labs    03/24/18 0113  TROPIPOC 0.11*   No results for input(s): CKTOTAL, CKMB, TROPONINI in the last 72 hours. Lab Results  Component Value Date   WBC 10.0 03/24/2018   HGB 17.0 03/24/2018   HCT 50.0 03/24/2018   MCV 86.3 03/24/2018   PLT 290 03/24/2018    Recent Labs  Lab 03/24/18 0058 03/24/18 0115  NA 141 141  K 4.6 4.6  CL 108 106  CO2 24  --   BUN 13 17  CREATININE 1.60* 1.60*  CALCIUM 8.6*  --   GLUCOSE 171* 140*   Lab Results  Component Value Date   CHOL 211 (H) 01/16/2018   HDL 24 (L) 01/16/2018   LDLCALC 122 (H) 01/16/2018   TRIG 323 (H) 01/16/2018   Lab Results  Component Value Date   DDIMER <0.27 01/15/2018     Radiology Studies    Dg Chest Port 1 View  Result Date: 03/24/2018 CLINICAL DATA:  Atrial fibrillation all week with chest pain. Dyspnea. EXAM: PORTABLE CHEST 1 VIEW COMPARISON:  Priors dating back to 04/10/2016 FINDINGS: Borderline cardiomegaly with aortic atherosclerosis. Mild diffuse interstitial edema. No pulmonary consolidation. External defibrillator paddles project over the left hemithorax. No acute osseous abnormality. IMPRESSION: Stable mild cardiomegaly with aortic atherosclerosis. Mild increase in interstitial edema since priors. Electronically  Signed   By: Ashley Royalty M.D.   On: 03/24/2018 01:46   Dg Chest Port 1 View  Result Date: 02/22/2018 CLINICAL DATA:  Chest pain.  Atrial fibrillation. EXAM: PORTABLE CHEST 1 VIEW COMPARISON:  01/15/2018 FINDINGS: The heart size and mediastinal contours are within normal limits. Both lungs are clear. The visualized skeletal structures are unremarkable. IMPRESSION: No active disease. Electronically Signed   By: Lucienne Capers M.D.   On: 02/22/2018 06:13    ECG & Cardiac Imaging    Samuel Flynn 2:1 rate 110 (variable block)  Assessment & Plan    Samuel Flynn: Known history of Fib. On metop and flecanaide. Seems to have failed that strategy. Multiple comorbid factors that likely maintain in fib Samuel Flynn (OSA, Obesity). Possibly also hypertensive. Trop elevated. Very likely type 2. Never been cathed. TTE with preserved LVEF. Trop was elevated repeatedly during his presentations.   Plan: - Admit to cards - diuresis/bipap for pulm edema - Continue on Amio load - Consider EP ablation - Stressed the need to control risk factors such as obesity and OSA - might need a LHC  Signed, Cristina Gong, MD 03/24/2018, 1:55 AM

## 2018-03-24 NOTE — Progress Notes (Signed)
Subjective:  Did not sleep well because he did not have his CPAP last night.  Remains in atrial fibrillation and flutter this morning but somewhat slower rate but still somewhat fast.  Objective:  Vital Signs in the last 24 hours: BP 95/65 (BP Location: Left Arm)   Pulse 61   Temp 97.7 F (36.5 C) (Oral)   Resp 20   Ht 6\' 2"  (1.88 m)   Wt (!) 139.8 kg (308 lb 3.3 oz)   SpO2 98%   BMI 39.57 kg/m   Physical Exam: Severely obese male currently in no acute distress Lungs:  Clear Cardiac: Irregular rhythm, normal S1 and S2, no S3 Abdomen:  Soft, nontender, no masses Extremities:  No edema present  Intake/Output from previous day: 07/20 0701 - 07/21 0700 In: 219.4 [P.O.:120; I.V.:99.4] Out: 1700 [Urine:1700]  Weight Filed Weights   03/24/18 0054 03/24/18 0405  Weight: 136.1 kg (300 lb) (!) 139.8 kg (308 lb 3.3 oz)    Lab Results: Basic Metabolic Panel: Recent Labs    03/24/18 0058 03/24/18 0115 03/24/18 0500  NA 141 141 141  K 4.6 4.6 4.7  CL 108 106 107  CO2 24  --  25  GLUCOSE 171* 140* 142*  BUN 13 17 14   CREATININE 1.60* 1.60* 1.62*   CBC: Recent Labs    03/24/18 0112 03/24/18 0115  WBC 10.0  --   NEUTROABS 6.8  --   HGB 15.7 17.0  HCT 48.7 50.0  MCV 86.3  --   PLT 290  --    Cardiac Enzymes: Troponin (Point of Care Test) Recent Labs    03/24/18 0113  TROPIPOC 0.11*    Telemetry: Atrial fibrillation and flutter rate around 110  Assessment/Plan:  1.  Recurrent atrial fibrillation and flutter-he is clearly had atrial fibrillation previously and has already failed one antiarrhythmic drug 2.  Sleep apnea 3.  Morbid obesity 4.  Hypertensive heart disease  Recommendations:  He is feeling better and is currently on amiodarone intravenously and diltiazem.  Continue this overnight and then convert to oral therapy in the morning.  Agree that an EP consult to consider an atrial fibrillation and flutter ablation at his age and with failure on medical  therapy would be a good thing.  Arrange for him to have CPAP tonight.  Discussed with Dr. Lovena Le who will get Dr. Curt Bears to evaluate the patient in the morning.    Kerry Hough  MD [facility]  03/24/2018, 8:48 AM

## 2018-03-24 NOTE — Progress Notes (Signed)
MD Fudim called to clarify order for 0300 bolus to be given. Ordered not to give. Bolus already given prior to prevous start of amiodarone drip. Also asked MD about pain meds for patient d/t 6/10 chest pain. No pain meds ordered at this time. Will contiue to observe. 1 in nitro paste to chest noted. VSS.

## 2018-03-24 NOTE — Progress Notes (Signed)
Pt taken off BIPAP and placed on 3L [place]. Pt appears much more comfortable, clear bbs noted, no obvious respiratory distress at this time. RT will continue to monitor.

## 2018-03-24 NOTE — ED Triage Notes (Signed)
Reports afib coming and going all week with chest pain.  Hx of same.  Reports pain as pressure that radiates into back and neck.  Also endorses sob.

## 2018-03-24 NOTE — Progress Notes (Signed)
Patient here via stretcher from ED. On 02 at 2L via nasal cannula. Ambulated to bed in room after being weighed on standing scale. CHG bath completed. Karlene RN assist with body audit. No skin issues noted. Nitro paste noted to chest. Amiodarone gtt infusing at 33.55ml/hr. VSS. Patient alert and able to make needs known. Oriented to staff and room. Call light within reach. Siderails up x 3. Urinal in reach. Non slip socks applied.

## 2018-03-24 NOTE — Progress Notes (Signed)
  Amiodarone Drug - Drug Interaction Consult Note  Recommendations: No changes to regimen needed.  Monitor for bradycardia with BBlocker on board.  Amiodarone is metabolized by the cytochrome P450 system and therefore has the potential to cause many drug interactions. Amiodarone has an average plasma half-life of 50 days (range 20 to 100 days).   There is potential for drug interactions to occur several weeks or months after stopping treatment and the onset of drug interactions may be slow after initiating amiodarone.   []  Statins: Increased risk of myopathy. Simvastatin- restrict dose to 20mg  daily. Other statins: counsel patients to report any muscle pain or weakness immediately.  []  Anticoagulants: Amiodarone can increase anticoagulant effect. Consider warfarin dose reduction. Patients should be monitored closely and the dose of anticoagulant altered accordingly, remembering that amiodarone levels take several weeks to stabilize.  []  Antiepileptics: Amiodarone can increase plasma concentration of phenytoin, the dose should be reduced. Note that small changes in phenytoin dose can result in large changes in levels. Monitor patient and counsel on signs of toxicity.  [x]  Beta blockers: increased risk of bradycardia, AV block and myocardial depression. Sotalol - avoid concomitant use.  []   Calcium channel blockers (diltiazem and verapamil): increased risk of bradycardia, AV block and myocardial depression.  []   Cyclosporine: Amiodarone increases levels of cyclosporine. Reduced dose of cyclosporine is recommended.  []  Digoxin dose should be halved when amiodarone is started.  []  Diuretics: increased risk of cardiotoxicity if hypokalemia occurs.  []  Oral hypoglycemic agents (glyburide, glipizide, glimepiride): increased risk of hypoglycemia. Patient's glucose levels should be monitored closely when initiating amiodarone therapy.   []  Drugs that prolong the QT interval:  Torsades de pointes  risk may be increased with concurrent use - avoid if possible.  Monitor QTc, also keep magnesium/potassium WNL if concurrent therapy can't be avoided. Marland Kitchen Antibiotics: e.g. fluoroquinolones, erythromycin. . Antiarrhythmics: e.g. quinidine, procainamide, disopyramide, sotalol. . Antipsychotics: e.g. phenothiazines, haloperidol.  . Lithium, tricyclic antidepressants, and methadone.   Rober Minion, PharmD., MS Clinical Pharmacist Pager:  [phone] Thank you for allowing pharmacy to be part of this patients care team.

## 2018-03-25 DIAGNOSIS — G4733 Obstructive sleep apnea (adult) (pediatric): Secondary | ICD-10-CM

## 2018-03-25 DIAGNOSIS — I10 Essential (primary) hypertension: Secondary | ICD-10-CM

## 2018-03-25 DIAGNOSIS — I483 Typical atrial flutter: Secondary | ICD-10-CM

## 2018-03-25 NOTE — Consult Note (Addendum)
Cardiology Consultation:   Patient ID: METRO EDENFIELD; [id number]; Mar 02, 1976   Admit date: 03/24/2018 Date of Consult: 03/25/2018  Primary Care Provider: Helane Rima, MD Primary Cardiologist: Skeet Latch, MD  Primary Electrophysiologist:  new   Patient Profile:   Samuel Flynn is a 42 y.o. male with a hx of HTN, obsesity, OSA w/CPAP, CKD, and PAFib 1st diagnosed in 2017 who is being seen today for the evaluation of AFib at the request of Dr. Claiborne Billings.  History of Present Illness:   Mr. Huguley saught medical attention for palpitations/SOB.  He denies any clear trigger to his AF.  This seemed to be the worst of them all.  He denies CP or SOB outside of his AF.  He arrived with HR >200, initially  Treated with adenosine without change, amio bolus slowed some to show AFlutter, started on gtt and transitioned to PO after conversion to SR yesterday  He is using his CPAP every night without fail, does no smoke or use drugs, he tells me he has cut back his drinking to a few drinks on the weekends only.  LABS K+ 4.7 BUN/Creat 14/1.62 (Creat waxes/wanes 1.2-1.6) WBC 10.0 H/H 17/50 Plts 290 TSH 3.097  AFib hx Diagnosed Feb 2017, started on Metoprolol and xarelto 11/2015 Low risk lexiscan stress test  Aug 2017recurrent visit with AFib/RVR (in environment of foot cellulitis) May 2019 recurrent AFib/RVR, missed xarelto doses, TEE/DCCV, flecainide added to metoprolol June 2019 non-diagnositic ETT This admission, flecainide stopped, amio gtt >> PO  Past Medical History:  Diagnosis Date  . Atrial fibrillation with RVR ([place])   . Cellulitis 04/2016   RT FOOT  . Gastric ulcer   . Gout   . Hypertension   . Paroxysmal A-fib ([place])   . Sleep apnea    USES CPAP  . Wears glasses     Past Surgical History:  Procedure Laterality Date  . CARDIOVERSION N/A 01/18/2018   Procedure: CARDIOVERSION;  Surgeon: Larey Dresser, MD;  Location: [facility] ENDOSCOPY;  Service: Cardiovascular;   Laterality: N/A;  . ELBOW LIGAMENT RECONSTRUCTION Right 09/29/2014   Procedure: Merian Capron FRACTURE FIXATION, POSSIBLE LIGAMENT REPAIR;  Surgeon: Nita Sells, MD;  Location: [place];  Service: Orthopedics;  Laterality: Right;  Right elbow coranoid fracture fixation, possible ligament repair  . TEE WITHOUT CARDIOVERSION N/A 01/18/2018   Procedure: TRANSESOPHAGEAL ECHOCARDIOGRAM (TEE);  Surgeon: Larey Dresser, MD;  Location: [facility] ENDOSCOPY;  Service: Cardiovascular;  Laterality: N/A;  . WISDOM TOOTH EXTRACTION        Inpatient Medications: Scheduled Meds: . allopurinol  100 mg Oral Daily  . amiodarone  200 mg Oral BID  . lisinopril  20 mg Oral Daily  . metoprolol succinate  25 mg Oral Daily  . pantoprazole  40 mg Oral Daily  . rivaroxaban  20 mg Oral QAC supper   Continuous Infusions:  PRN Meds: acetaminophen, dicyclomine, ibuprofen, ondansetron (ZOFRAN) IV  Allergies:   No Known Allergies  Social History:   Social History   Socioeconomic History  . Marital status: Married    Spouse name: Not on file  . Number of children: Not on file  . Years of education: Not on file  . Highest education level: Not on file  Occupational History  . Occupation: Truck Education administrator: [employer]  . Financial resource strain: Not on file  . Food insecurity:    Worry: Not on file    Inability: Not on file  .  Transportation needs:    Medical: Not on file    Non-medical: Not on file  Tobacco Use  . Smoking status: Former Smoker    Types: Cigars    Last attempt to quit: 02/03/2016    Years since quitting: 2.1  . Smokeless tobacco: Never Used  Substance and Sexual Activity  . Alcohol use: Yes    Alcohol/week: 0.0 oz    Comment: occ  . Drug use: No  . Sexual activity: Yes    Comment: 5 a day  Lifestyle  . Physical activity:    Days per week: Not on file    Minutes per session: Not on file  . Stress: Not on file  Relationships  .  Social connections:    Talks on phone: Not on file    Gets together: Not on file    Attends religious service: Not on file    Active member of club or organization: Not on file    Attends meetings of clubs or organizations: Not on file    Relationship status: Not on file  . Intimate partner violence:    Fear of current or ex partner: Not on file    Emotionally abused: Not on file    Physically abused: Not on file    Forced sexual activity: Not on file  Other Topics Concern  . Not on file  Social History Narrative  . Not on file    Family History:   Family History  Problem Relation Age of Onset  . Hypertension Paternal Uncle   . Hypertension Maternal Grandmother   . Hypertension Paternal Uncle   . Hypertension Paternal Uncle   . Hypertension Paternal Uncle   . Hypertension Paternal Uncle      ROS:  Please see the history of present illness.  All other ROS reviewed and negative.     Physical Exam/Data:   Vitals:   03/25/18 0618 03/25/18 0800 03/25/18 0815 03/25/18 1009  BP: 112/73 (!) 91/46 (!) 91/46 119/74  Pulse: (!) 47 (!) 45 (!) 47 (!) 54  Resp: 18 16 16    Temp: (!) 97 F (36.1 C)     TempSrc: Axillary     SpO2: 97% 98% 96%   Weight:      Height:        Intake/Output Summary (Last 24 hours) at 03/25/2018 1046 Last data filed at 03/24/2018 2100 Gross per 24 hour  Intake 112.07 ml  Output 950 ml  Net -837.93 ml   Filed Weights   03/24/18 0054 03/24/18 0405  Weight: 300 lb (136.1 kg) (!) 308 lb 3.3 oz (139.8 kg)   Body mass index is 39.57 kg/m.  General:  Well nourished, well developed, in no acute distress HEENT: normal Lymph: no adenopathy Neck: no JVD Endocrine:  No thryomegaly Vascular: No carotid bruits Cardiac:  RRR; no murmurs, gallops or rubs Lungs:  CTA b/l, no wheezing, rhonchi or rales  Abd: soft, nontender,obese  Ext: no edema Musculoskeletal:  No deformities Skin: warm and dry  Neuro:  no gross focal abnormalities noted Psych:  Normal  affect   EKG:  The EKG was personally reviewed and demonstrates:   03/24/18: @0048 ,  suspect 1:1 AFlutter, 215bpm, diffuse ST changes 03/24/18 @0117 , AFlutter/typical, 111bpm, resolved ST changes 03/24/18 @ 0238 AFlutter/atypical 143bpm vs caurse AFib 03/24/18 @0257  SR, 72bpm  01/15/18 AFib 143bpm  Telemetry:  Telemetry was personally reviewed and demonstrates:   AFib/flutter w/RVR, yesterday  >> SR with some short bursts of AF, less this  AM so far  Relevant CV Studies:  02/07/18; ETT  Blood pressure demonstrated a normal response to exercise.  Clinically negative. The patient experienced no chest pain. Stopped due to cramping in legs  Electrically nondiagnostic as patient did not achieve target heart rate or adequate pressure rate product.  01/18/18: TEE Study Conclusions - Left ventricle: The cavity size was normal. Wall thickness was   normal. Systolic function was normal. The estimated ejection   fraction was in the range of 55% to 60%. Wall motion was normal;   there were no regional wall motion abnormalities. No evidence of   thrombus. - Aortic valve: There was no stenosis. - Aorta: Normal caliber thoracic aorta with no significant plaque. - Mitral valve: There was trivial regurgitation. - Left atrium: The atrium was mildly dilated. No evidence of   thrombus in the atrial cavity or appendage. No evidence of   thrombus in the atrial cavity or appendage. - Right ventricle: The cavity size was normal. Systolic function   was normal. - Right atrium: No evidence of thrombus in the atrial cavity or   appendage. - Atrial septum: No ASD/PFO by color doppler. Impressions: - Successful cardioversion. No cardiac source of emboli was   indentified.  11/11/15 Lexiscan stress myoview  The left ventricular ejection fraction is mildly decreased (45-54%).  Nuclear stress EF: 52%.  There was no ST segment deviation noted during stress.  No T wave inversion was noted during  stress.  The study is normal.  This is a low risk study.      Laboratory Data:  Chemistry Recent Labs  Lab 03/24/18 0058 03/24/18 0115 03/24/18 0500  NA 141 141 141  K 4.6 4.6 4.7  CL 108 106 107  CO2 24  --  25  GLUCOSE 171* 140* 142*  BUN 13 17 14   CREATININE 1.60* 1.60* 1.62*  CALCIUM 8.6*  --  8.8*  GFRNONAA 51*  --  50*  GFRAA 59*  --  59*  ANIONGAP 9  --  9    No results for input(s): PROT, ALBUMIN, AST, ALT, ALKPHOS, BILITOT in the last 168 hours. Hematology Recent Labs  Lab 03/24/18 0112 03/24/18 0115  WBC 10.0  --   RBC 5.64  --   HGB 15.7 17.0  HCT 48.7 50.0  MCV 86.3  --   MCH 27.8  --   MCHC 32.2  --   RDW 19.0*  --   PLT 290  --    Cardiac EnzymesNo results for input(s): TROPONINI in the last 168 hours.  Recent Labs  Lab 03/24/18 0113  TROPIPOC 0.11*    BNP Recent Labs  Lab 03/24/18 0112  BNP 290.1*    DDimer No results for input(s): DDIMER in the last 168 hours.  Radiology/Studies:   Dg Chest Port 1 View Result Date: 03/24/2018 CLINICAL DATA:  Atrial fibrillation all week with chest pain. Dyspnea. EXAM: PORTABLE CHEST 1 VIEW COMPARISON:  Priors dating back to 04/10/2016 FINDINGS: Borderline cardiomegaly with aortic atherosclerosis. Mild diffuse interstitial edema. No pulmonary consolidation. External defibrillator paddles project over the left hemithorax. No acute osseous abnormality. IMPRESSION: Stable mild cardiomegaly with aortic atherosclerosis. Mild increase in interstitial edema since priors. Electronically Signed   By: Ashley Royalty M.D.   On: 03/24/2018 01:46    Assessment and Plan:   1. Persistent AFib/flutter     Seems he has all 3, atypical and typical AFlutter, AFib     CHA2DS2Vasc is one, on Xarelto  The patient  reports compliance with all of his medicines, no missed doses. Specifically his xarelto also asked if he may have taken flecainide without his metoprolol, though he says no, that he has been taking both.  I  wonder if the flecainide organized his AFib to 1:1 flutter, though less likely given his BB. He has a number of atrial arrhythmias otherwise. Given young age would avoid amiodarone long-term  [facility] Nearhood review with Dr. Caryl Comes, given the number of atrial arrhythmias seen, not sure if ablation is the right strategy.  Perhaps Tikosyn allowing time off amio.  Given his weight, Calc CrCl is still acceptable.   For questions or updates, please contact [place] Please consult [email or web address] for contact info under Cardiology/STEMI.   Signed, Baldwin Jamaica, PA-C  03/25/2018 10:46 AM  I have seen and examined this patient with Tommye Standard.  Agree with above, note added to reflect my findings.  On exam, RRR, no murmurs, lungs clear.  Patient initially presented to the hospital with atrial flutter conducting at 1:1.  He had previously been on flecainide and had been apparently compliant with his beta-blocker.  His symptoms were shortness of breath, chest pain, and palpitations.  He was put on amiodarone ultimately and is since converted to sinus rhythm.  At this point, he would likely benefit from rhythm control, though I feel that flecainide is not the correct medication.  He is also bradycardic at baseline and thus would be difficult to adjust his rate control.  I discussed with him the possibility of dofetilide loading versus ablation.  At this point, he would prefer to avoid further medications and would prefer ablation.  We Mehtaab Mayeda set this up as an outpatient.  Agree with discharge today on continued amiodarone.  Huntington Leverich M. Sharlynn Seckinger MD 03/26/2018 7:31 AM

## 2018-03-25 NOTE — Progress Notes (Signed)
  Spoke to Dr. Carlyn Reichert and the patient.  Given the atrial fibrillation and the atrial flutter and his young age, and particularly in the context of having a left atrial dimension which is pretty small, think that ablation as primary therapy would be appropriate to consider.  Attenuating concerns are his weight.  Dr. Carlyn Reichert will see the patient in the morning

## 2018-03-25 NOTE — Progress Notes (Signed)
Progress Note  Patient Name: Samuel Flynn Date of Encounter: 03/25/2018  Primary Cardiologist: Skeet Latch, MD   Subjective   Patient admitted on 03/24/2018 with A. fib with RVR.  He was treated with adenosine in the emergency room and converted to a flutter.  He is on Xarelto.  He was placed on amiodarone and converted to sinus rhythm/sinus bradycardia last night and feels clinically improved.  Inpatient Medications    Scheduled Meds: . allopurinol  100 mg Oral Daily  . amiodarone  200 mg Oral BID  . lisinopril  20 mg Oral Daily  . metoprolol succinate  25 mg Oral Daily  . pantoprazole  40 mg Oral Daily  . rivaroxaban  20 mg Oral QAC supper   Continuous Infusions:  PRN Meds: acetaminophen, dicyclomine, ibuprofen, ondansetron (ZOFRAN) IV   Vital Signs    Vitals:   03/25/18 0042 03/25/18 0618 03/25/18 0800 03/25/18 0815  BP: 104/67 112/73 (!) 91/46 (!) 91/46  Pulse: 61 (!) 47 (!) 45 (!) 47  Resp:  18 16 16   Temp: 97.8 F (36.6 C) (!) 97 F (36.1 C)    TempSrc: Axillary Axillary    SpO2: 95% 97% 98% 96%  Weight:      Height:        Intake/Output Summary (Last 24 hours) at 03/25/2018 1006 Last data filed at 03/24/2018 2100 Gross per 24 hour  Intake 112.07 ml  Output 950 ml  Net -837.93 ml   Filed Weights   03/24/18 0054 03/24/18 0405  Weight: 300 lb (136.1 kg) (!) 308 lb 3.3 oz (139.8 kg)    Telemetry    Sinus rhythm/sinus bradycardia with PACs.- Personally Reviewed  ECG    Not performed today- Personally Reviewed  Physical Exam   GEN: No acute distress.   Neck: No JVD Cardiac: RRR, no murmurs, rubs, or gallops.  Respiratory: Clear to auscultation bilaterally. GI: Soft, nontender, non-distended  MS: No edema; No deformity. Neuro:  Nonfocal  Psych: Normal affect   Labs    Chemistry Recent Labs  Lab 03/24/18 0058 03/24/18 0115 03/24/18 0500  NA 141 141 141  K 4.6 4.6 4.7  CL 108 106 107  CO2 24  --  25  GLUCOSE 171* 140* 142*    BUN 13 17 14   CREATININE 1.60* 1.60* 1.62*  CALCIUM 8.6*  --  8.8*  GFRNONAA 51*  --  50*  GFRAA 59*  --  59*  ANIONGAP 9  --  9     Hematology Recent Labs  Lab 03/24/18 0112 03/24/18 0115  WBC 10.0  --   RBC 5.64  --   HGB 15.7 17.0  HCT 48.7 50.0  MCV 86.3  --   MCH 27.8  --   MCHC 32.2  --   RDW 19.0*  --   PLT 290  --     Cardiac EnzymesNo results for input(s): TROPONINI in the last 168 hours.  Recent Labs  Lab 03/24/18 0113  TROPIPOC 0.11*     BNP Recent Labs  Lab 03/24/18 0112  BNP 290.1*     DDimer No results for input(s): DDIMER in the last 168 hours.   Radiology    Dg Chest Port 1 View  Result Date: 03/24/2018 CLINICAL DATA:  Atrial fibrillation all week with chest pain. Dyspnea. EXAM: PORTABLE CHEST 1 VIEW COMPARISON:  Priors dating back to 04/10/2016 FINDINGS: Borderline cardiomegaly with aortic atherosclerosis. Mild diffuse interstitial edema. No pulmonary consolidation. External defibrillator paddles project over the  left hemithorax. No acute osseous abnormality. IMPRESSION: Stable mild cardiomegaly with aortic atherosclerosis. Mild increase in interstitial edema since priors. Electronically Signed   By: Ashley Royalty M.D.   On: 03/24/2018 01:46    Cardiac Studies   None  Patient Profile     42 y.o. male moderately obese with a history of obstructive sleep apnea, hypertension and PAF.  He underwent cardioversion by Dr. Aundra Dubin on 01/18/2018 successfully.  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  He has been on Xarelto.  He was admitted with A. fib with RVR and converted to a flutter and ultimately to sinus rhythm/sinus bradycardia on IV amiodarone which has been transitioned to p.o.  Assessment & Plan    1: PAF- converted to sinus rhythm on amiodarone, currently transition from IV to p.o.  He was on flecainide in the past.  He has been on Xarelto.  We will get the EP service to consult regarding potential A.  fib ablation.  2: Essential hypertension- controlled on beta-blocker and lisinopril  3: Obstructive sleep apnea- on CPAP  For questions or updates, please contact [place] Please consult [email or web address] for contact info under Cardiology/STEMI.      Signed, Quay Burow, MD  03/25/2018, 10:06 AM

## 2018-03-25 NOTE — Discharge Instructions (Addendum)
PLEASE REMEMBER TO BRING ALL OF YOUR MEDICATIONS TO EACH OF YOUR FOLLOW-UP OFFICE VISITS.  PLEASE ATTEND ALL SCHEDULED FOLLOW-UP APPOINTMENTS.    Atrial Flutter Atrial flutter is a type of abnormal heart rhythm (arrhythmia). In atrial flutter, the heartbeat is fast but regular. There are two types of atrial flutter:  Paroxysmal atrial flutter. This type starts suddenly. It usually stops on its own soon after it starts.  Permanent atrial flutter. This type does not go away.  What are the causes? This condition may be caused by:  A heart condition or problem, such as: ? A heart attack. ? Heart failure. ? A heart valve problem.  A lung problem, such as: ? A blood clot in the lungs (pulmonary embolism, or PE). ? Chronic obstructive pulmonary disease.  Poorly controlled high blood pressure (hypertension).  Hyperthyroidism.  Caffeine.  Some decongestant cold medicines.  Low levels of minerals called electrolytes in the blood.  Cocaine.  What increases the risk? This condition is more likely to develop in:  Elderly adults.  Men.  What are the signs or symptoms? Symptoms of this condition include:  A feeling that your heart is pounding or racing (palpitations).  Shortness of breath.  Chest pain.  Feeling light-headed.  Dizziness.  Fainting.  How is this diagnosed? This condition may be diagnosed with tests, including:  An electrocardiogram (ECG). This is a painless test that records electrical signals in the heart.  Holter monitoring. For this test, you wear a device that records your heartbeat for 1-2 days.  Cardiac event monitoring. For this test, you wear a device that records your heartbeat for up to 30 days.  An echocardiogram. This is a painless test that uses sound waves to make a picture of your heart.  Stress test. This test records your heartbeat while you exercise.  Blood tests.  How is this treated? This condition may be treated  with:  Treatment of any underlying conditions.  Medicine to make your heart beat more slowly.  Medicine to keep the condition from coming back.  A procedure to keep the condition under control. Some procedures to do this include: ? Cardioversion. During this procedure, medicines or an electrical shock are given to make the heart beat normally. ? Ablation. During this procedure, the heart tissue that is causing the problem is destroyed. This procedure may be done if atrial flutter lasts a long time or happens often.  Follow these instructions at home:  Take over-the-counter and prescription medicines only as told by your health care provider.  Do not take any new medicines without talking to your health care provider.  Do not use tobacco products, including cigarettes, chewing tobacco, or e-cigarettes. If you need help quitting, ask your health care provider.  Limit alcohol intake to no more than 1 drink per day for nonpregnant women and 2 drinks per day for men. One drink equals 12 oz of beer, 5 oz of wine, or 1 oz of hard liquor.  Try to reduce any stress. Stress can make your symptoms worse. Contact a health care provider if:  Your symptoms get worse. Get help right away if:  You are dizzy.  You feel like fainting or you faint.  You have shortness of breath.  You feel pain or pressure in your chest.  You suddenly feel nauseous or you suddenly vomit.  There is a sudden change in your ability to speak, eat, or move.  You are sweating a lot for no reason. This information is  not intended to replace advice given to you by your health care provider. Make sure you discuss any questions you have with your health care provider. Document Released: 01/07/2009 Document Revised: 12/29/2015 Document Reviewed: 03/05/2015 Elsevier Interactive Patient Education  2018 [place] on my medicine - XARELTO (Rivaroxaban)  Why was Xarelto prescribed for  you? Xarelto was prescribed for you to reduce the risk of a blood clot forming that can cause a stroke if you have a medical condition called atrial fibrillation (a type of irregular heartbeat).  What do you need to know about xarelto ? Take your Xarelto ONCE DAILY at the same time every day with your evening meal. If you have difficulty swallowing the tablet whole, you may crush it and mix in applesauce just prior to taking your dose.  Take Xarelto exactly as prescribed by your doctor and DO NOT stop taking Xarelto without talking to the doctor who prescribed the medication.  Stopping without other stroke prevention medication to take the place of Xarelto may increase your risk of developing a clot that causes a stroke.  Refill your prescription before you run out.  After discharge, you should have regular check-up appointments with your healthcare provider that is prescribing your Xarelto.  In the future your dose may need to be changed if your kidney function or weight changes by a significant amount.  What do you do if you miss a dose? If you are taking Xarelto ONCE DAILY and you miss a dose, take it as soon as you remember on the same day then continue your regularly scheduled once daily regimen the next day. Do not take two doses of Xarelto at the same time or on the same day.   Important Safety Information A possible side effect of Xarelto is bleeding. You should call your healthcare provider right away if you experience any of the following: ? Bleeding from an injury or your nose that does not stop. ? Unusual colored urine (red or dark brown) or unusual colored stools (red or black). ? Unusual bruising for unknown reasons. ? A serious fall or if you hit your head (even if there is no bleeding).  Some medicines may interact with Xarelto and might increase your risk of bleeding while on Xarelto. To help avoid this, consult your healthcare provider or pharmacist prior to using  any new prescription or non-prescription medications, including herbals, vitamins, non-steroidal anti-inflammatory drugs (NSAIDs) and supplements.  This website has more information on Xarelto: [email or web address]-[email or web address].[email or web address]

## 2018-03-26 ENCOUNTER — Other Ambulatory Visit: Payer: Self-pay | Admitting: Cardiovascular Disease

## 2018-03-26 MED ORDER — AMIODARONE HCL 200 MG PO TABS: 200.0000 mg | ORAL_TABLET | Freq: Two times a day (BID) | ORAL | 1 refills | Status: DC

## 2018-03-26 NOTE — Discharge Summary (Addendum)
Discharge Summary    Patient ID: Samuel Flynn,  MRN: [id number], DOB/AGE: Aug 09, 1976 42 y.o.  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 He presents the third time in 2 months for Fib/flutter to the ED. He reports to have felt SOB on the day of admission and noted to be tachycardic. He usually is not that SOB with his tachycardia episodes. Also he usally is not that fast. On 03/24/18 he was found to have a HR of 220, with a  Regular rate on presentation in the ED. He has previously been DCCV and has been on metop and flecainide. He reports compliance with rivaroxiban and his bipap at home. No new meds or stressors recently.   In the ED he was given adenosine without change in rhythm. Started on Amio (bolus once). He converted into a slower flutter rate (~110). CXR with pulm edema. Started on Bipap and lasix.     Hospital Course     Consultants: EP   1. Atrial fibrillation/ Flutter: patient presented to the ED with SOB and tachycardia. He was found to be in atrial flutter and was started on amiodarone IV. He converted to NSR/sinus bradycardia and was transitioned to po amiodarone. EP evaluated patient and recommended continuing amiodarone and possible outpatient evaluation for ablation.  - Continue amiodarone 200mg  BID - Continue xarelto  - EP to contact patient for ablation in the next 2 weeks.  2. HTN: BP stable throughout admission on home metoprolol and lisinopril - Continue home metoprolol and lisinopril  3. OSA: on CPAP and reports  compliance - Continue home CPAP qHS   _____________  Discharge Vitals Blood pressure 128/76, pulse (!) 45, temperature 97.9 F (36.6 C), temperature source Oral, resp. rate 18, height 6\' 2"  (1.88 m), weight (!) 308 lb 3.3 oz (139.8 kg), SpO2 97 %.  Filed Weights   03/24/18 0054 03/24/18 0405  Weight: 300 lb (136.1 kg) (!) 308 lb 3.3 oz (139.8 kg)    Labs & Radiologic Studies    CBC Recent Labs    03/24/18 0112 03/24/18 0115  WBC 10.0  --   NEUTROABS 6.8  --   HGB 15.7 17.0  HCT 48.7 50.0  MCV 86.3  --   PLT 290  --    Basic Metabolic Panel Recent Labs    03/24/18 0058 03/24/18 0115 03/24/18 0500  NA 141 141 141  K 4.6 4.6 4.7  CL 108 106 107  CO2 24  --  25  GLUCOSE 171* 140* 142*  BUN 13 17 14   CREATININE 1.60* 1.60* 1.62*  CALCIUM 8.6*  --  8.8*   Liver Function Tests No results for input(s): AST, ALT, ALKPHOS, BILITOT, PROT, ALBUMIN in the last 72 hours. No results for input(s): LIPASE, AMYLASE in the last 72 hours. Cardiac Enzymes No results for input(s): CKTOTAL, CKMB, CKMBINDEX, TROPONINI in the last 72 hours. BNP Invalid input(s): POCBNP D-Dimer No results for input(s): DDIMER in the last 72 hours. Hemoglobin A1C Recent Labs    03/24/18 0722  HGBA1C 5.7*   Fasting Lipid Panel No results for  input(s): CHOL, HDL, LDLCALC, TRIG, CHOLHDL, LDLDIRECT in the last 72 hours. Thyroid Function Tests Recent Labs    03/24/18 0253  TSH 3.097   _____________  Dg Chest Port 1 View  Result Date: 03/24/2018 CLINICAL DATA:  Atrial fibrillation all week with chest pain. Dyspnea. EXAM: PORTABLE CHEST 1 VIEW COMPARISON:  [facility] dating back to 04/10/2016 FINDINGS: Borderline cardiomegaly with aortic atherosclerosis. Mild diffuse interstitial edema. No pulmonary consolidation. External defibrillator paddles project over the left hemithorax. No acute osseous abnormality. IMPRESSION: Stable mild cardiomegaly with aortic atherosclerosis. Mild increase in interstitial  edema since priors. Electronically Signed   By: Ashley Royalty M.D.   On: 03/24/2018 01:46   Disposition   Patient was seen and examined by Dr. Gwenlyn Found who deemed patient as stable for discharge. Follow-up has been arranged. Discharge medications as listed below.   Follow-up Plans & Appointments    Follow-up Information    Constance Haw, MD Follow up.   Specialty:  Cardiology Why:  The office will contact you directly with an appointment date/time for possible ablation.  Contact information: [address] [place] [address] [phone]        Lonn Georgia, PA-C Follow up on 04/01/2018.   Specialties:  Cardiology, Radiology Why:  Please arrive 15 minutes early for your 1:30pm appointment Contact information: [address] [place] [address] [phone]        Lelon Perla, MD Follow up on 05/02/2018.   Specialty:  Cardiology Why:  Please arrive 15 minutes early for your 3:00pm appointment Contact information: [place] [address] [phone]            Discharge Medications   Allergies as of 03/26/2018   No Known Allergies     Medication List    STOP taking these medications   flecainide 50 MG tablet Commonly known as:  TAMBOCOR     TAKE these medications   allopurinol 100 MG tablet Commonly known as:  ZYLOPRIM Take 100 mg by mouth daily. For gout   amiodarone 200 MG tablet Commonly known as:  PACERONE Take 1 tablet (200 mg total) by mouth 2 (two) times daily.   colchicine 0.6 MG tablet Take 0.6 mg by mouth daily as needed (gout flare).   dicyclomine 20 MG tablet Commonly known as:  BENTYL Take 20 mg by mouth 3 (three) times daily as needed for spasms.   FISH OIL PO Take 1 tablet by mouth daily.   ibuprofen 200 MG tablet Commonly known as:  ADVIL,MOTRIN Take 800 mg by mouth every 6 (six) hours as needed for headache or moderate pain.   lisinopril 20 MG tablet Commonly  known as:  PRINIVIL,ZESTRIL Take 1 tablet (20 mg total) by mouth daily. For blood pressure   metoprolol succinate 25 MG 24 hr tablet Commonly known as:  TOPROL-XL Take 1 tablet (25 mg total) by mouth daily.   pantoprazole 40 MG tablet Commonly known as:  PROTONIX Take 1 tablet (40 mg total) by mouth daily. NEED OV.   XARELTO 20 MG Tabs tablet Generic drug:  rivaroxaban TAKE 1 TABLET (20 MG TOTAL) BY MOUTH DAILY WITH SUPPER.       Outstanding Labs/Studies   None  Duration of Discharge Encounter   Greater than 30 minutes including physician time.  Signed, Abigail Butts PA-C 03/26/2018, 12:30 PM  Agree with assessment and plan by Roby Lofts PA-C  Stable for discharge.  Patient will follow-up  with Dr. Curt Bears as an outpatient for consideration of A. fib ablation.  He will be sent home on amiodarone.  Lorretta Harp, M.D., [place], [facility], Laverta [place] [address]. [place], [place]  [address]  [phone] 03/27/2018 12:53 PM

## 2018-03-26 NOTE — Progress Notes (Signed)
Progress Note  Patient Name: Samuel Flynn Date of Encounter: 03/26/2018  Primary Cardiologist: Skeet Latch, MD  Electrophysiology cardiologist- Dr. Allegra Lai   Subjective   Patient admitted on 03/24/2018 with A. fib with RVR.  He was treated with adenosine in the emergency room and converted to a flutter.  He is on Xarelto.  He was placed on amiodarone and converted to sinus rhythm/sinus bradycardia and feels clinically improved.  Inpatient Medications    Scheduled Meds: . allopurinol  100 mg Oral Daily  . amiodarone  200 mg Oral BID  . lisinopril  20 mg Oral Daily  . metoprolol succinate  25 mg Oral Daily  . pantoprazole  40 mg Oral Daily  . rivaroxaban  20 mg Oral QAC supper   Continuous Infusions:  PRN Meds: acetaminophen, dicyclomine, ibuprofen, ondansetron (ZOFRAN) IV   Vital Signs    Vitals:   03/25/18 2335 03/25/18 2340 03/26/18 0349 03/26/18 0738  BP:  (!) 117/58 112/69 127/71  Pulse: (!) 56 (!) 51 (!) 50 (!) 48  Resp:  (!) 24 20 20   Temp:  98.2 F (36.8 C) 97.7 F (36.5 C) 97.9 F (36.6 C)  TempSrc:  Oral Oral Oral  SpO2: 94% 95% 95% 98%  Weight:      Height:        Intake/Output Summary (Last 24 hours) at 03/26/2018 0820 Last data filed at 03/26/2018 0815 Gross per 24 hour  Intake 790 ml  Output 250 ml  Net 540 ml   Filed Weights   03/24/18 0054 03/24/18 0405  Weight: 300 lb (136.1 kg) (!) 308 lb 3.3 oz (139.8 kg)    Telemetry    Sinus rhythm/sinus bradycardia with PACs.- Personally Reviewed  ECG    Not performed today- Personally Reviewed  Physical Exam   GEN: No acute distress.   Neck: No JVD Cardiac: RRR, no murmurs, rubs, or gallops.  Respiratory: Clear to auscultation bilaterally. GI: Soft, nontender, non-distended  MS: No edema; No deformity. Neuro:  Nonfocal  Psych: Normal affect   Labs    Chemistry Recent Labs  Lab 03/24/18 0058 03/24/18 0115 03/24/18 0500  NA 141 141 141  K 4.6 4.6 4.7  CL 108 106  107  CO2 24  --  25  GLUCOSE 171* 140* 142*  BUN 13 17 14   CREATININE 1.60* 1.60* 1.62*  CALCIUM 8.6*  --  8.8*  GFRNONAA 51*  --  50*  GFRAA 59*  --  59*  ANIONGAP 9  --  9     Hematology Recent Labs  Lab 03/24/18 0112 03/24/18 0115  WBC 10.0  --   RBC 5.64  --   HGB 15.7 17.0  HCT 48.7 50.0  MCV 86.3  --   MCH 27.8  --   MCHC 32.2  --   RDW 19.0*  --   PLT 290  --     Cardiac EnzymesNo results for input(s): TROPONINI in the last 168 hours.  Recent Labs  Lab 03/24/18 0113  TROPIPOC 0.11*     BNP Recent Labs  Lab 03/24/18 0112  BNP 290.1*     DDimer No results for input(s): DDIMER in the last 168 hours.   Radiology    No results found.  Cardiac Studies   None  Patient Profile     42 y.o. male moderately obese with a history of obstructive sleep apnea, hypertension and PAF.  He underwent cardioversion by Dr. Aundra Dubin on 01/18/2018 successfully.  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  He has been on Xarelto.  He was admitted with A. fib with RVR and converted to a flutter and ultimately to sinus rhythm/sinus bradycardia on IV amiodarone which has been transitioned to p.o.  Assessment & Plan    1: PAF- converted to sinus rhythm on amiodarone.  He was on flecainide in the past.  He has been on Xarelto.  He was seen by Dr. Curt Bears regarding the possibility of A. fib ablation.  The conversation was had regarding antiarrhythmic therapy versus ablation and the decision was to pursue ablation.  Patient remains in sinus rhythm/sinus bradycardia.  We will continue an amiodarone load.  He stable for discharge home today.  2: Essential hypertension- controlled on beta-blocker and lisinopril  3: Obstructive sleep apnea- on CPAP  For questions or updates, please contact [place] Please consult [email or web address] for contact info under Cardiology/STEMI.      Signed, Quay Burow, MD  03/26/2018, 8:20 AM

## 2018-03-26 NOTE — Progress Notes (Signed)
Patient currently on CPAP, via FFM with 2 lpm O2 bleed in. Previous home settings of 15.0 cm H20

## 2018-03-26 NOTE — Progress Notes (Signed)
Pt being discharged home via wheelchair with family. Pt alert and oriented x4. VSS. Pt c/o no pain at this time. No signs of respiratory distress. Education complete and care plans resolved. IV removed with catheter intact and pt tolerated well. No further issues at this time. Pt to follow up with PCP. Blinda Turek R, RN 

## 2018-03-28 ENCOUNTER — Telehealth: Payer: Self-pay | Admitting: *Deleted

## 2018-03-28 DIAGNOSIS — I4819 Other persistent atrial fibrillation: Secondary | ICD-10-CM

## 2018-03-28 NOTE — Telephone Encounter (Signed)
Pt would like to go on 8/23. Pt aware I will schedule and call him next week to review instructions, etc. Pt is agreeable to plan.

## 2018-03-28 NOTE — Telephone Encounter (Signed)
-----   Message from Will Meredith Leeds, MD sent at 03/26/2018  7:34 AM EDT ----- Can we set this guy up for AF ablation? Thanks!

## 2018-04-01 ENCOUNTER — Encounter: Payer: Self-pay | Admitting: Physician Assistant

## 2018-04-01 ENCOUNTER — Ambulatory Visit: Payer: PRIVATE HEALTH INSURANCE | Admitting: Physician Assistant

## 2018-04-01 VITALS — BP 130/73 | HR 53 | Ht 74.0 in | Wt 309.6 lb

## 2018-04-01 DIAGNOSIS — N183 Chronic kidney disease, stage 3 unspecified: Secondary | ICD-10-CM

## 2018-04-01 DIAGNOSIS — R739 Hyperglycemia, unspecified: Secondary | ICD-10-CM

## 2018-04-01 DIAGNOSIS — I10 Essential (primary) hypertension: Secondary | ICD-10-CM

## 2018-04-01 DIAGNOSIS — I483 Typical atrial flutter: Secondary | ICD-10-CM | POA: Diagnosis not present

## 2018-04-01 DIAGNOSIS — R001 Bradycardia, unspecified: Secondary | ICD-10-CM

## 2018-04-01 MED ORDER — METOPROLOL SUCCINATE ER 25 MG PO TB24: 12.5000 mg | ORAL_TABLET | Freq: Every day | ORAL | 2 refills | Status: DC

## 2018-04-01 MED ORDER — SPIRONOLACTONE 25 MG PO TABS: 12.5000 mg | ORAL_TABLET | Freq: Every day | ORAL | 3 refills | Status: DC

## 2018-04-01 NOTE — Patient Instructions (Addendum)
Medication Instructions:  1. DECREASE Metoprolol to 12.5mg  (cut 25mg  tablet in half; take half tablet daily) 2. START Spironolactone 12.5mg  (cut 25mg  tablet in half; take half tablet daily)  Labwork: Your physician recommends that you return for lab work in: [facility]  Testing/Procedures: None  Follow-Up: Keep As Scheduled  Any Other Special Instructions Will Be Listed Below (If Applicable). 1. FOLLOW UP WITH YOUR NUTRITONIST AT WORK 2. CONTINUE TO CHECK YOUR WEIGHTS DAILY 3. EAT A LOW SODIUM DIET, NO MORE THAN 500MG  OF SODIUM PER MEAL  If you need a refill on your cardiac medications before your next appointment, please call your pharmacy.

## 2018-04-01 NOTE — Progress Notes (Signed)
Cardiology Office Note   Date:  04/01/2018   ID:  Samuel Flynn, DOB Jan 14, 1976, MRN [id number]  PCP:  Helane Rima, MD  Cardiologist:  Dr Stanford Breed, 01/18/2018 in hospital Rosaria Ferries, PA-C 01/30/2018  Chief Complaint  Patient presents with  . Atrial Flutter     History of Present Illness: Samuel Flynn is a 42 y.o. male with a history of atrial fibrillationnoncompliant withXarelto, HTN, obesity, tob use and OSAon CPAP  Admitted 7/21-7/23/2019 for rapid atrial flutter, was started on amiodarone, required BiPAP and Lasix for pulmonary edema, spontaneously converted to sinus rhythm/sinus bradycardia, DC flecainide, follow-up outpatient with Dr. Curt Bears to consider ablation, tentatively scheduled for 8/23  Samuel Flynn presents for cardiology follow up.  No doses of Xarelto or amiodarone missed.  Has had a few flutters, felt a little light-headed with them.   He wants to go ahead and get the ablation.  He has been to the hospital 3 times and is missing work from this and is tired of it.  No bleeding issues on the Xarelto.   He feels tired and sluggish. Not aware of low HR.   A little light-headed at times when his heart is fluttering, but not otherwise.  In general, denies presyncope or syncope..   Some DOE. Does not have scales at home, has been weighing at work. He does this first thing. Is frustrated that he is not losing weight.  He is eating salads and feel that he is generally eating much healthier than he was, but still is not losing weight.  Has a dietitian he can see every 3 months through work for free.  Wonders if gastric bypass or some other procedure is a good idea for him, to help him lose weight  He is not having problems waking with lower extremity edema.  Partly because of the CPAP, is not having any orthopnea or PND.  No tobacco, compliant w/ CPAP   Past Medical History:  Diagnosis Date  . Atrial fibrillation with RVR ([place])   .  Cellulitis 04/2016   RT FOOT  . Gastric ulcer   . Gout   . Hypertension   . Paroxysmal A-fib ([place])   . Sleep apnea    USES CPAP  . Wears glasses     Past Surgical History:  Procedure Laterality Date  . CARDIOVERSION N/A 01/18/2018   Procedure: CARDIOVERSION;  Surgeon: Larey Dresser, MD;  Location: [facility] ENDOSCOPY;  Service: Cardiovascular;  Laterality: N/A;  . ELBOW LIGAMENT RECONSTRUCTION Right 09/29/2014   Procedure: Merian Capron FRACTURE FIXATION, POSSIBLE LIGAMENT REPAIR;  Surgeon: Nita Sells, MD;  Location: [facility];  Service: Orthopedics;  Laterality: Right;  Right elbow coranoid fracture fixation, possible ligament repair  . TEE WITHOUT CARDIOVERSION N/A 01/18/2018   Procedure: TRANSESOPHAGEAL ECHOCARDIOGRAM (TEE);  Surgeon: Larey Dresser, MD;  Location: [facility] ENDOSCOPY;  Service: Cardiovascular;  Laterality: N/A;  . WISDOM TOOTH EXTRACTION      Current Outpatient Medications  Medication Sig Dispense Refill  . allopurinol (ZYLOPRIM) 100 MG tablet Take 100 mg by mouth daily. For gout    . amiodarone (PACERONE) 200 MG tablet Take 1 tablet (200 mg total) by mouth 2 (two) times daily. 60 tablet 1  . colchicine 0.6 MG tablet Take 0.6 mg by mouth daily as needed (gout flare).   1  . dicyclomine (BENTYL) 20 MG tablet Take 20 mg by mouth 3 (three) times daily as needed for spasms.     Marland Kitchen  ibuprofen (ADVIL,MOTRIN) 200 MG tablet Take 800 mg by mouth every 6 (six) hours as needed for headache or moderate pain.    Marland Kitchen lisinopril (PRINIVIL,ZESTRIL) 20 MG tablet Take 1 tablet (20 mg total) by mouth daily. For blood pressure 30 tablet 2  . metoprolol succinate (TOPROL-XL) 25 MG 24 hr tablet Take 1 tablet (25 mg total) by mouth daily. 30 tablet 2  . Omega-3 Fatty Acids (FISH OIL PO) Take 1 tablet by mouth daily.    . pantoprazole (PROTONIX) 40 MG tablet Take 1 tablet (40 mg total) by mouth daily. NEED OV. 30 tablet 2  . XARELTO 20 MG TABS tablet TAKE 1 TABLET (20 MG TOTAL)  BY MOUTH DAILY WITH SUPPER. 30 tablet 0   No current facility-administered medications for this visit.     Allergies:   Patient has no known allergies.    Social History:  The patient  reports that he quit smoking about 2 years ago. His smoking use included cigars. He has never used smokeless tobacco. He reports that he drinks alcohol. He reports that he does not use drugs.   Family History:  The patient's family history includes Hypertension in his maternal grandmother, paternal uncle, paternal uncle, paternal uncle, paternal uncle, and paternal uncle; Stroke in his father.  The patient indicated that his mother is alive. He indicated that his father is alive. He indicated that his maternal grandmother is deceased. He indicated that all of his five paternal uncles are alive.    ROS:  Please see the history of present illness. All other systems are reviewed and negative.    PHYSICAL EXAM: VS:  BP 130/73   Pulse (!) 53   Ht 6\' 2"  (1.88 m)   Wt (!) 309 lb 9.6 oz (140.4 kg)   BMI 39.75 kg/m  , BMI Body mass index is 39.75 kg/m. GEN: Well nourished, well developed, male in no acute distress  HEENT: normal for age  Neck: no JVD, no carotid bruit, no masses Cardiac: RRR; no murmur, no rubs, or gallops Respiratory:  clear to auscultation bilaterally, normal work of breathing GI: soft, nontender, nondistended, + BS MS: no deformity or atrophy; no edema; distal pulses are 2+ in all 4 extremities   Skin: warm and dry, no rash Neuro:  Strength and sensation are intact Psych: euthymic mood, full affect   EKG:  EKG is ordered today. The ekg ordered today demonstrates sinus bradycardia, heart rate 53, diffuse T wave flattening, no significant change from 03/24/2018  ECHO: 01/18/2018 - Left ventricle: The cavity size was normal. Wall thickness was   normal. Systolic function was normal. The estimated ejection   fraction was in the range of 55% to 60%. Wall motion was normal;   there were  no regional wall motion abnormalities. No evidence of   thrombus. - Aortic valve: There was no stenosis. - Aorta: Normal caliber thoracic aorta with no significant plaque. - Mitral valve: There was trivial regurgitation. - Left atrium: The atrium was mildly dilated. No evidence of   thrombus in the atrial cavity or appendage. No evidence of   thrombus in the atrial cavity or appendage. - Right ventricle: The cavity size was normal. Systolic function   was normal. - Right atrium: No evidence of thrombus in the atrial cavity or   appendage. - Atrial septum: No ASD/PFO by color doppler. Impressions: - Successful cardioversion. No cardiac source of emboli was   indentified.   Recent Labs: 02/22/2018: Magnesium 2.3 03/24/2018: B Natriuretic  Peptide 290.1; BUN 14; Creatinine, Ser 1.62; Hemoglobin 17.0; Platelets 290; Potassium 4.7; Sodium 141; TSH 3.097    Lipid Panel    Component Value Date/Time   CHOL 211 (H) 01/16/2018 0916   TRIG 323 (H) 01/16/2018 0916   HDL 24 (L) 01/16/2018 0916   CHOLHDL 8.8 01/16/2018 0916   VLDL 65 (H) 01/16/2018 0916   LDLCALC 122 (H) 01/16/2018 0916     Wt Readings from Last 3 Encounters:  04/01/18 (!) 309 lb 9.6 oz (140.4 kg)  03/24/18 (!) 308 lb 3.3 oz (139.8 kg)  02/22/18 300 lb (136.1 kg)     Other studies Reviewed: Additional studies/ records that were reviewed today include: Office notes, hospital records and testing.  ASSESSMENT AND PLAN:  1.  Atrial flutter, rapid ventricular response: Mr. Rehberg is tired of dealing with this.  He is tired of it.  He wishes to go ahead and get the ablation.  Dr. Curt Bears had previously spoken to him about the ablation and it is scheduled for August 23.  2.  Bradycardia: He is bradycardic at baseline.  He is having symptoms of fatigue and feeling sluggish all the time.  He was previously on Toprol-XL 50 mg a day and that was decreased to 25 mg a day.  We will cut it back further to 12.5 mg a day, hopefully  he can tolerate that.  3.  Hypertension: With the decrease in his metoprolol, his blood pressure may run a little high.  Additionally, he had some problems with volume overload and was in pulmonary edema when he first went to the hospital.  As Spironolactone 12.5 mg daily  4.  Chronic kidney disease: His creatinine was 1.5 a year ago and was about the same when discharge from the hospital.  He will get labs done prior to his ablation  5.  Hyperglycemia: Blood sugars have been consistently elevated. However, HgA1c was high normal at 5.7. Continue healthier eating habits.   Current medicines are reviewed at length with the patient today.  The patient does not have concerns regarding medicines.  The following changes have been made: Add Spironolactone  Labs/ tests ordered today include:   Orders Placed This Encounter  Procedures  . Hemoglobin A1c  . EKG 12-Lead     Disposition:   FU with Dr. Curt Bears and Dr Stanford Breed.  He currently has an appointment in August with Dr. Stanford Breed, originally listed as a 44-month follow-up.  This may be needed for post hospital appointment, keep it on the books for now.  Jonetta Speak, PA-C  04/01/2018 4:23 PM    [place] Phone: [phone]; Fax: [phone]  This note was written with the assistance of speech recognition software. Please excuse any transcriptional errors.

## 2018-04-05 NOTE — Telephone Encounter (Signed)
Informed I would call him Monday to review instructions. Pt agreeable to plan.

## 2018-04-09 NOTE — Telephone Encounter (Signed)
Scheduled pt to see Dr. Curt Bears next Monday. Will go over procedure instructions at that appt. Patient verbalized understanding and agreeable to plan.

## 2018-04-11 NOTE — Telephone Encounter (Signed)
Offered CT appt for 8/19 or 8/20 -- pt agreeable to CT on 8/19. He understands we will discuss instructions at [place] Monday.

## 2018-04-12 ENCOUNTER — Encounter: Payer: Self-pay | Admitting: Cardiology

## 2018-04-15 ENCOUNTER — Encounter: Payer: Self-pay | Admitting: Cardiology

## 2018-04-15 ENCOUNTER — Ambulatory Visit: Payer: PRIVATE HEALTH INSURANCE | Admitting: Cardiology

## 2018-04-15 VITALS — BP 112/68 | HR 50 | Ht 72.0 in | Wt 302.6 lb

## 2018-04-15 DIAGNOSIS — G4733 Obstructive sleep apnea (adult) (pediatric): Secondary | ICD-10-CM

## 2018-04-15 DIAGNOSIS — I481 Persistent atrial fibrillation: Secondary | ICD-10-CM | POA: Diagnosis not present

## 2018-04-15 DIAGNOSIS — I4819 Other persistent atrial fibrillation: Secondary | ICD-10-CM

## 2018-04-15 DIAGNOSIS — Z01812 Encounter for preprocedural laboratory examination: Secondary | ICD-10-CM | POA: Diagnosis not present

## 2018-04-15 NOTE — Patient Instructions (Addendum)
Medication Instructions:  Your physician recommends that you continue on your current medications as directed. Please refer to the Current Medication list given to you today.  * If you need a refill on your cardiac medications before your next appointment, please call your pharmacy.   Labwork: Pre procedure lab work today: BMET & CBC *We will only notify you of abnormal results, otherwise continue current treatment plan.  Testing/Procedures: Your physician has recommended that you have an ablation. Catheter ablation is a medical procedure used to treat some cardiac arrhythmias (irregular heartbeats). During catheter ablation, a long, thin, flexible tube is put into a blood vessel in your groin (upper thigh), or neck. This tube is called an ablation catheter. It is then guided to your heart through the blood vessel. Radio frequency waves destroy small areas of heart tissue where abnormal heartbeats may cause an arrhythmia to start. Please see the instruction sheet given to you today. Instructions for your ablation: 1. Please arrive at the [facility], Main Entrance "A", of [facility] at 9:30 a.m. on 04/26/2018. 2. Do not eat or drink after midnight the night prior to the procedure. 3. Do not miss any doses of XARELTO prior to the morning of the procedure.  4. Do not take any medications the morning of the procedure. 5. Both of your groins will need to be shaved for this procedure (if needed). We ask that you do this yourself at home 1-2 days prior to the procedure.  If you are unable/uncomfortable to do yourself, the hospital staff will shave you the day of your procedure (if needed). 6. Plan for an overnight stay in the hospital. 7. You will need someone to drive you home at discharge.   Follow-Up: Your physician recommends that you schedule a follow-up appointment in: 4 weeks, after your procedure on 04/26/18, with Roderic Palau in the AFib clinic.  Your physician recommends that you  schedule a follow-up appointment in: 3 months, after your procedure on 04/26/18, with Dr. Curt Bears.  *Please note that any paperwork needing to be filled out by the provider will need to be addressed at the front desk prior to seeing the provider. Please note that any FMLA, disability or other documents regarding health condition is subject to a $25.00 charge that must be received prior to completion of paperwork in the form of a money order or check.  Thank you for choosing [facility]!!   Trinidad Curet, RN [phone]  Any Other Special Instructions Will Be Listed Below (If Applicable).  Please arrive at the [facility] main entrance of [facility] at xx:xx AM (30-45 minutes prior to test start time)  [facility] [place], [place] [address] [phone]  Proceed to the [facility] Radiology Department (First Floor).  Please follow these instructions carefully (unless otherwise directed):  Hold all erectile dysfunction medications at least 48 hours prior to test.  On the Night Before the Test: . Drink plenty of water. . Do not consume any caffeinated/decaffeinated beverages or chocolate 12 hours prior to your test. . Do not take any antihistamines 12 hours prior to your test.  On the Day of the Test: . Drink plenty of water. Do not drink any water within one hour of the test. . Do not eat any food 4 hours prior to the test. . You may take your regular medications prior to the test. . MAKE SURE TO TAKE YOUR TOPROL MEDICATION THE MORNING OF THIS TEST .  HOLD Furosemide morning of the test.  After the Test: . Drink plenty of water. . After receiving IV contrast, you may experience a mild flushed feeling. This is normal. . On occasion, you may experience a mild rash up to 24 hours after the test. This is not dangerous. If this occurs, you can take Benadryl 25 mg and increase your fluid intake. . If you experience trouble breathing, this can  be serious. If it is severe call 911 IMMEDIATELY. If it is mild, please call our office.     Cardiac Ablation Cardiac ablation is a procedure to disable (ablate) a small amount of heart tissue in very specific places. The heart has many electrical connections. Sometimes these connections are abnormal and can cause the heart to beat very fast or irregularly. Ablating some of the problem areas can improve the heart rhythm or return it to normal. Ablation may be done for people who:  Have Wolff-Parkinson-White syndrome.  Have fast heart rhythms (tachycardia).  Have taken medicines for an abnormal heart rhythm (arrhythmia) that were not effective or caused side effects.  Have a high-risk heartbeat that may be life-threatening.  During the procedure, a small incision is made in the neck or the groin, and a long, thin, flexible tube (catheter) is inserted into the incision and moved to the heart. Small devices (electrodes) on the tip of the catheter will send out electrical currents. A type of X-ray (fluoroscopy) will be used to help guide the catheter and to provide images of the heart. Tell a health care provider about:  Any allergies you have.  All medicines you are taking, including vitamins, herbs, eye drops, creams, and over-the-counter medicines.  Any problems you or family members have had with anesthetic medicines.  Any blood disorders you have.  Any surgeries you have had.  Any medical conditions you have, such as kidney failure.  Whether you are pregnant or may be pregnant. What are the risks? Generally, this is a safe procedure. However, problems may occur, including:  Infection.  Bruising and bleeding at the catheter insertion site.  Bleeding into the chest, especially into the sac that surrounds the heart. This is a serious complication.  Stroke or blood clots.  Damage to other structures or organs.  Allergic reaction to medicines or dyes.  Need for a permanent  pacemaker if the normal electrical system is damaged. A pacemaker is a small computer that sends electrical signals to the heart and helps your heart beat normally.  The procedure not being fully effective. This may not be recognized until months later. Repeat ablation procedures are sometimes required.  What happens before the procedure?  Follow instructions from your health care provider about eating or drinking restrictions.  Ask your health care provider about: ? Changing or stopping your regular medicines. This is especially important if you are taking diabetes medicines or blood thinners. ? Taking medicines such as aspirin and ibuprofen. These medicines can thin your blood. Do not take these medicines before your procedure if your health care provider instructs you not to.  Plan to have someone take you home from the hospital or clinic.  If you will be going home right after the procedure, plan to have someone with you for 24 hours. What happens during the procedure?  To lower your risk of infection: ? Your health care team will wash or sanitize their hands. ? Your skin will be washed with soap. ? Hair may be removed from the incision area.  An  IV tube will be inserted into one of your veins.  You will be given a medicine to help you relax (sedative).  The skin on your neck or groin will be numbed.  An incision will be made in your neck or your groin.  A needle will be inserted through the incision and into a large vein in your neck or groin.  A catheter will be inserted into the needle and moved to your heart.  Dye may be injected through the catheter to help your surgeon see the area of the heart that needs treatment.  Electrical currents will be sent from the catheter to ablate heart tissue in desired areas. There are three types of energy that may be used to ablate heart tissue: ? Heat (radiofrequency energy). ? Laser energy. ? Extreme cold (cryoablation).  When the  necessary tissue has been ablated, the catheter will be removed.  Pressure will be held on the catheter insertion area to prevent excessive bleeding.  A bandage (dressing) will be placed over the catheter insertion area. The procedure may vary among health care providers and hospitals. What happens after the procedure?  Your blood pressure, heart rate, breathing rate, and blood oxygen level will be monitored until the medicines you were given have worn off.  Your catheter insertion area will be monitored for bleeding. You will need to lie still for a few hours to ensure that you do not bleed from the catheter insertion area.  Do not drive for 24 hours or as long as directed by your health care provider. Summary  Cardiac ablation is a procedure to disable (ablate) a small amount of heart tissue in very specific places. Ablating some of the problem areas can improve the heart rhythm or return it to normal.  During the procedure, electrical currents will be sent from the catheter to ablate heart tissue in desired areas. This information is not intended to replace advice given to you by your health care provider. Make sure you discuss any questions you have with your health care provider. Document Released: 01/07/2009 Document Revised: 07/10/2016 Document Reviewed: 07/10/2016 Elsevier Interactive Patient Education  [employer].

## 2018-04-15 NOTE — Progress Notes (Signed)
Electrophysiology Office Note   Date:  04/15/2018   ID:  Samuel Flynn, DOB [birth year], MRN [id number]  PCP:  Helane Rima, MD  Cardiologist:  Oval Linsey Primary Electrophysiologist:  Isidro Monks Meredith Leeds, MD    No chief complaint on file.    History of Present Illness: Samuel Flynn is a 42 y.o. male who is being seen today for the evaluation of atrial fibrillation at the request of Skeet Latch. Presenting today for electrophysiology evaluation.  A history of hypertension, obesity, OSA on CPAP, CKD, paroxysmal atrial fibrillation diagnosed in 2017.  He presented to the hospital with palpitations and was found to be in atrial fibrillation.  His heart rate was greater than 200.  He was given adenosine without change.  Amiodarone was given which converted him to atrial flutter and he subsequently had a cardioversion.  He is planned for atrial fibrillation ablation 04/26/2018.   Today, he denies symptoms of palpitations, chest pain, shortness of breath, orthopnea, PND, lower extremity edema, claudication, dizziness, presyncope, syncope, bleeding, or neurologic sequela. The patient is tolerating medications without difficulties.    Past Medical History:  Diagnosis Date  . Atrial fibrillation with RVR ([place])   . Cellulitis 04/2016   RT FOOT  . Gastric ulcer   . Gout   . Hypertension   . Paroxysmal A-fib ([place])   . Sleep apnea    USES CPAP  . Wears glasses    Past Surgical History:  Procedure Laterality Date  . CARDIOVERSION N/A 01/18/2018   Procedure: CARDIOVERSION;  Surgeon: Larey Dresser, MD;  Location: [facility] ENDOSCOPY;  Service: Cardiovascular;  Laterality: N/A;  . ELBOW LIGAMENT RECONSTRUCTION Right 09/29/2014   Procedure: Merian Capron FRACTURE FIXATION, POSSIBLE LIGAMENT REPAIR;  Surgeon: Nita Sells, MD;  Location: [place];  Service: Orthopedics;  Laterality: Right;  Right elbow coranoid fracture fixation, possible ligament repair  . TEE  WITHOUT CARDIOVERSION N/A 01/18/2018   Procedure: TRANSESOPHAGEAL ECHOCARDIOGRAM (TEE);  Surgeon: Larey Dresser, MD;  Location: [facility] ENDOSCOPY;  Service: Cardiovascular;  Laterality: N/A;  . WISDOM TOOTH EXTRACTION       Current Outpatient Medications  Medication Sig Dispense Refill  . allopurinol (ZYLOPRIM) 100 MG tablet Take 100 mg by mouth daily. For gout    . amiodarone (PACERONE) 200 MG tablet Take 1 tablet (200 mg total) by mouth 2 (two) times daily. 60 tablet 1  . colchicine 0.6 MG tablet Take 0.6 mg by mouth daily as needed (gout flare).   1  . dicyclomine (BENTYL) 20 MG tablet Take 20 mg by mouth 3 (three) times daily as needed for spasms.     Marland Kitchen ibuprofen (ADVIL,MOTRIN) 200 MG tablet Take 800 mg by mouth every 6 (six) hours as needed for headache or moderate pain.    Marland Kitchen lisinopril (PRINIVIL,ZESTRIL) 20 MG tablet Take 1 tablet (20 mg total) by mouth daily. For blood pressure 30 tablet 2  . metoprolol succinate (TOPROL-XL) 25 MG 24 hr tablet Take 0.5 tablets (12.5 mg total) by mouth daily. 30 tablet 2  . Omega-3 Fatty Acids (FISH OIL PO) Take 1 tablet by mouth daily.    . pantoprazole (PROTONIX) 40 MG tablet Take 1 tablet (40 mg total) by mouth daily. NEED OV. 30 tablet 2  . spironolactone (ALDACTONE) 25 MG tablet Take 0.5 tablets (12.5 mg total) by mouth daily. 30 tablet 3  . XARELTO 20 MG TABS tablet TAKE 1 TABLET (20 MG TOTAL) BY MOUTH DAILY WITH SUPPER. 30 tablet 0  No current facility-administered medications for this visit.     Allergies:   Patient has no known allergies.   Social History:  The patient  reports that he quit smoking about 2 years ago. His smoking use included cigars. He has never used smokeless tobacco. He reports that he drinks alcohol. He reports that he does not use drugs.   Family History:  The patient's family history includes Hypertension in his maternal grandmother, paternal uncle, paternal uncle, paternal uncle, paternal uncle, and paternal uncle;  Stroke in his father.    ROS:  Please see the history of present illness.   Otherwise, review of systems is positive for her, palpitations, shortness of breath, snoring.   All other systems are reviewed and negative.    PHYSICAL EXAM: VS:  BP 112/68   Pulse (!) 50   Ht 6' (1.829 m)   Wt (!) 302 lb 9.6 oz (137.3 kg)   SpO2 95%   BMI 41.04 kg/m  , BMI Body mass index is 41.04 kg/m. GEN: Well nourished, well developed, in no acute distress  HEENT: normal  Neck: no JVD, carotid bruits, or masses Cardiac: RRR; no murmurs, rubs, or gallops,no edema  Respiratory:  clear to auscultation bilaterally, normal work of breathing GI: soft, nontender, nondistended, + BS MS: no deformity or atrophy  Skin: warm and dry Neuro:  Strength and sensation are intact Psych: euthymic mood, full affect  EKG:  EKG is not ordered today. Personal review of the ekg ordered 04/01/18 shows sinus rhythm, rate 53  Recent Labs: 02/22/2018: Magnesium 2.3 03/24/2018: B Natriuretic Peptide 290.1; BUN 14; Creatinine, Ser 1.62; Hemoglobin 17.0; Platelets 290; Potassium 4.7; Sodium 141; TSH 3.097    Lipid Panel     Component Value Date/Time   CHOL 211 (H) 01/16/2018 0916   TRIG 323 (H) 01/16/2018 0916   HDL 24 (L) 01/16/2018 0916   CHOLHDL 8.8 01/16/2018 0916   VLDL 65 (H) 01/16/2018 0916   LDLCALC 122 (H) 01/16/2018 0916     Wt Readings from Last 3 Encounters:  04/15/18 (!) 302 lb 9.6 oz (137.3 kg)  04/01/18 (!) 309 lb 9.6 oz (140.4 kg)  03/24/18 (!) 308 lb 3.3 oz (139.8 kg)      Other studies Reviewed: Additional studies/ records that were reviewed today include: TEE 01/30/18  Review of the above records today demonstrates:  - Left ventricle: The cavity size was normal. Wall thickness was   normal. Systolic function was normal. The estimated ejection   fraction was in the range of 55% to 60%. Wall motion was normal;   there were no regional wall motion abnormalities. No evidence of   thrombus. -  Aortic valve: There was no stenosis. - Aorta: Normal caliber thoracic aorta with no significant plaque. - Mitral valve: There was trivial regurgitation. - Left atrium: The atrium was mildly dilated. No evidence of   thrombus in the atrial cavity or appendage. No evidence of   thrombus in the atrial cavity or appendage. - Right ventricle: The cavity size was normal. Systolic function   was normal. - Right atrium: No evidence of thrombus in the atrial cavity or   appendage. - Atrial septum: No ASD/PFO by color doppler.   ASSESSMENT AND PLAN:  1.  Persistent atrial fibrillation/flutter: Currently on Xarelto and amiodarone.  Plan for ablation 04/26/2018.  Risks and benefits were discussed and include bleeding, tamponade, heart block, stroke, damage to surrounding organs.  The patient understands these risks and is agreed to the  procedure.  2.  Obstructive sleep apnea: Encouraged CPAP compliance    Current medicines are reviewed at length with the patient today.   The patient does not have concerns regarding his medicines.  The following changes were made today:  none  Labs/ tests ordered today include:  Orders Placed This Encounter  Procedures  . Basic Metabolic Panel (BMET)  . CBC     Disposition:   FU with Samvel Zinn 3 months  Signed, Kaushik Maul Meredith Leeds, MD  04/15/2018 11:02 AM     [facility] [address] [place] [address] [phone] (office) [phone] (fax)

## 2018-04-16 LAB — CBC
HEMATOCRIT: 43.2 % (ref 37.5–51.0)
Hemoglobin: 14.1 g/dL (ref 13.0–17.7)
MCH: 27.3 pg (ref 26.6–33.0)
MCHC: 32.6 g/dL (ref 31.5–35.7)
MCV: 84 fL (ref 79–97)
PLATELETS: 264 10*3/uL (ref 150–450)
RBC: 5.16 x10E6/uL (ref 4.14–5.80)
RDW: 18.2 % — ABNORMAL HIGH (ref 12.3–15.4)
WBC: 7.5 10*3/uL (ref 3.4–10.8)

## 2018-04-16 LAB — BASIC METABOLIC PANEL
BUN/Creatinine Ratio: 16 (ref 9–20)
BUN: 22 mg/dL (ref 6–24)
CO2: 22 mmol/L (ref 20–29)
CREATININE: 1.34 mg/dL — AB (ref 0.76–1.27)
Calcium: 9 mg/dL (ref 8.7–10.2)
Chloride: 103 mmol/L (ref 96–106)
GFR calc Af Amer: 74 mL/min/{1.73_m2} (ref >59)
GFR calc non Af Amer: 64 mL/min/{1.73_m2} (ref >59)
Glucose: 100 mg/dL — ABNORMAL HIGH (ref 65–99)
Potassium: 4.6 mmol/L (ref 3.5–5.2)
Sodium: 141 mmol/L (ref 134–144)

## 2018-04-20 ENCOUNTER — Other Ambulatory Visit: Payer: Self-pay | Admitting: Cardiology

## 2018-04-22 ENCOUNTER — Ambulatory Visit (HOSPITAL_COMMUNITY)
Admission: RE | Admit: 2018-04-22 | Discharge: 2018-04-22 | Disposition: A | Discharge status: Home / Self Care | Payer: PRIVATE HEALTH INSURANCE | Source: Ambulatory Visit | Attending: Cardiology | Admitting: Cardiology

## 2018-04-22 ENCOUNTER — Ambulatory Visit (HOSPITAL_COMMUNITY): Payer: PRIVATE HEALTH INSURANCE

## 2018-04-22 DIAGNOSIS — I4892 Unspecified atrial flutter: Secondary | ICD-10-CM

## 2018-04-22 DIAGNOSIS — I481 Persistent atrial fibrillation: Secondary | ICD-10-CM | POA: Diagnosis not present

## 2018-04-22 DIAGNOSIS — I4819 Other persistent atrial fibrillation: Secondary | ICD-10-CM

## 2018-04-22 MED ORDER — IOPAMIDOL (ISOVUE-370) INJECTION 76%
100.0000 mL | Freq: Once | INTRAVENOUS | Status: AC | PRN
  Administered 2018-04-22: 100 mL via INTRAVENOUS

## 2018-04-26 ENCOUNTER — Ambulatory Visit (HOSPITAL_COMMUNITY): Payer: PRIVATE HEALTH INSURANCE | Admitting: Certified Registered Nurse Anesthetist

## 2018-04-26 ENCOUNTER — Other Ambulatory Visit: Payer: Self-pay

## 2018-04-26 ENCOUNTER — Ambulatory Visit (HOSPITAL_COMMUNITY)
Admission: RE | Admit: 2018-04-26 | Discharge: 2018-04-27 | Disposition: A | Discharge status: Home / Self Care | Payer: PRIVATE HEALTH INSURANCE | Source: Ambulatory Visit | Attending: Cardiology | Admitting: Cardiology

## 2018-04-26 ENCOUNTER — Encounter (HOSPITAL_COMMUNITY)
Admission: RE | Disposition: A | Discharge status: Home / Self Care | Payer: Self-pay | Source: Ambulatory Visit | Attending: Cardiology

## 2018-04-26 ENCOUNTER — Encounter (HOSPITAL_COMMUNITY): Payer: Self-pay | Admitting: Certified Registered Nurse Anesthetist

## 2018-04-26 DIAGNOSIS — Z8249 Family history of ischemic heart disease and other diseases of the circulatory system: Secondary | ICD-10-CM | POA: Diagnosis not present

## 2018-04-26 DIAGNOSIS — I129 Hypertensive chronic kidney disease with stage 1 through stage 4 chronic kidney disease, or unspecified chronic kidney disease: Secondary | ICD-10-CM | POA: Diagnosis not present

## 2018-04-26 DIAGNOSIS — Z6841 Body Mass Index (BMI) 40.0 and over, adult: Secondary | ICD-10-CM | POA: Insufficient documentation

## 2018-04-26 DIAGNOSIS — I480 Paroxysmal atrial fibrillation: Secondary | ICD-10-CM

## 2018-04-26 DIAGNOSIS — Z87891 Personal history of nicotine dependence: Secondary | ICD-10-CM | POA: Insufficient documentation

## 2018-04-26 DIAGNOSIS — Z79899 Other long term (current) drug therapy: Secondary | ICD-10-CM | POA: Insufficient documentation

## 2018-04-26 DIAGNOSIS — Z9889 Other specified postprocedural states: Secondary | ICD-10-CM

## 2018-04-26 DIAGNOSIS — Z8679 Personal history of other diseases of the circulatory system: Secondary | ICD-10-CM

## 2018-04-26 DIAGNOSIS — Z7901 Long term (current) use of anticoagulants: Secondary | ICD-10-CM | POA: Diagnosis not present

## 2018-04-26 DIAGNOSIS — N189 Chronic kidney disease, unspecified: Secondary | ICD-10-CM | POA: Insufficient documentation

## 2018-04-26 DIAGNOSIS — Z823 Family history of stroke: Secondary | ICD-10-CM | POA: Diagnosis not present

## 2018-04-26 DIAGNOSIS — G4733 Obstructive sleep apnea (adult) (pediatric): Secondary | ICD-10-CM | POA: Diagnosis not present

## 2018-04-26 DIAGNOSIS — E669 Obesity, unspecified: Secondary | ICD-10-CM | POA: Diagnosis not present

## 2018-04-26 DIAGNOSIS — Z8711 Personal history of peptic ulcer disease: Secondary | ICD-10-CM | POA: Diagnosis not present

## 2018-04-26 HISTORY — PX: ATRIAL FIBRILLATION ABLATION: EP1191

## 2018-04-26 LAB — GLUCOSE, CAPILLARY
Glucose-Capillary: 93 mg/dL (ref 70–99)
Glucose-Capillary: 92 mg/dL (ref 70–99)

## 2018-04-26 LAB — POCT ACTIVATED CLOTTING TIME: ACTIVATED CLOTTING TIME: 164 s

## 2018-04-26 SURGERY — ATRIAL FIBRILLATION ABLATION
Anesthesia: General

## 2018-04-26 MED ORDER — PANTOPRAZOLE SODIUM 40 MG PO TBEC
40.0000 mg | DELAYED_RELEASE_TABLET | Freq: Every day | ORAL | Status: DC
  Administered 2018-04-27: 40 mg via ORAL
  Filled 2018-04-26: qty 1

## 2018-04-26 MED ORDER — HEPARIN SODIUM (PORCINE) 1000 UNIT/ML IJ SOLN
INTRAMUSCULAR | Status: DC | PRN
  Administered 2018-04-26: 1000 [IU] via INTRAVENOUS

## 2018-04-26 MED ORDER — SODIUM CHLORIDE 0.9 % IV SOLN: 250.0000 mL | INTRAVENOUS | Status: DC | PRN

## 2018-04-26 MED ORDER — SUGAMMADEX SODIUM 500 MG/5ML IV SOLN
INTRAVENOUS | Status: DC | PRN
  Administered 2018-04-26: 300 mg via INTRAVENOUS

## 2018-04-26 MED ORDER — DEXMEDETOMIDINE HCL IN NACL 200 MCG/50ML IV SOLN
INTRAVENOUS | Status: DC | PRN
  Administered 2018-04-26 (×4): 20 ug via INTRAVENOUS

## 2018-04-26 MED ORDER — HEPARIN (PORCINE) IN NACL 1000-0.9 UT/500ML-% IV SOLN
INTRAVENOUS | Status: AC
  Filled 2018-04-26: qty 500

## 2018-04-26 MED ORDER — DOBUTAMINE IN D5W 4-5 MG/ML-% IV SOLN
INTRAVENOUS | Status: DC | PRN
  Administered 2018-04-26: 20 ug/kg/min via INTRAVENOUS

## 2018-04-26 MED ORDER — RIVAROXABAN 20 MG PO TABS
20.0000 mg | ORAL_TABLET | Freq: Every day | ORAL | Status: DC
  Administered 2018-04-26 – 2018-04-27 (×2): 20 mg via ORAL
  Filled 2018-04-26 (×2): qty 1

## 2018-04-26 MED ORDER — MIDAZOLAM HCL 5 MG/5ML IJ SOLN
INTRAMUSCULAR | Status: DC | PRN
  Administered 2018-04-26: 2 mg via INTRAVENOUS

## 2018-04-26 MED ORDER — ONDANSETRON HCL 4 MG/2ML IJ SOLN
INTRAMUSCULAR | Status: DC | PRN
  Administered 2018-04-26: 4 mg via INTRAVENOUS

## 2018-04-26 MED ORDER — SPIRONOLACTONE 12.5 MG HALF TABLET
12.5000 mg | ORAL_TABLET | Freq: Every day | ORAL | Status: DC
  Administered 2018-04-27: 12.5 mg via ORAL
  Filled 2018-04-26: qty 1

## 2018-04-26 MED ORDER — ROCURONIUM BROMIDE 100 MG/10ML IV SOLN
INTRAVENOUS | Status: DC | PRN
  Administered 2018-04-26: 60 mg via INTRAVENOUS
  Administered 2018-04-26: 25 mg via INTRAVENOUS

## 2018-04-26 MED ORDER — COLCHICINE 0.6 MG PO TABS: 0.6000 mg | ORAL_TABLET | Freq: Every day | ORAL | Status: DC | PRN

## 2018-04-26 MED ORDER — HEPARIN SODIUM (PORCINE) 1000 UNIT/ML IJ SOLN
INTRAMUSCULAR | Status: AC
  Filled 2018-04-26: qty 1

## 2018-04-26 MED ORDER — SUCCINYLCHOLINE CHLORIDE 20 MG/ML IJ SOLN
INTRAMUSCULAR | Status: DC | PRN
  Administered 2018-04-26: 120 mg via INTRAVENOUS

## 2018-04-26 MED ORDER — FENTANYL CITRATE (PF) 100 MCG/2ML IJ SOLN
INTRAMUSCULAR | Status: DC | PRN
  Administered 2018-04-26: 100 ug via INTRAVENOUS
  Administered 2018-04-26 (×2): 50 ug via INTRAVENOUS
  Administered 2018-04-26: 100 ug via INTRAVENOUS

## 2018-04-26 MED ORDER — SODIUM CHLORIDE 0.9 % IV SOLN
INTRAVENOUS | Status: DC
  Administered 2018-04-26 (×2): via INTRAVENOUS

## 2018-04-26 MED ORDER — HEPARIN SODIUM (PORCINE) 1000 UNIT/ML IJ SOLN
INTRAMUSCULAR | Status: DC | PRN
  Administered 2018-04-26: 6000 [IU] via INTRAVENOUS
  Administered 2018-04-26: 3000 [IU] via INTRAVENOUS
  Administered 2018-04-26: 16000 [IU] via INTRAVENOUS
  Administered 2018-04-26: 4000 [IU] via INTRAVENOUS
  Administered 2018-04-26: 8000 [IU] via INTRAVENOUS

## 2018-04-26 MED ORDER — ALLOPURINOL 100 MG PO TABS
100.0000 mg | ORAL_TABLET | Freq: Every day | ORAL | Status: DC
  Administered 2018-04-27: 100 mg via ORAL
  Filled 2018-04-26 (×2): qty 1

## 2018-04-26 MED ORDER — IBUPROFEN 200 MG PO TABS: 800.0000 mg | ORAL_TABLET | Freq: Four times a day (QID) | ORAL | Status: DC | PRN

## 2018-04-26 MED ORDER — BUPIVACAINE HCL (PF) 0.25 % IJ SOLN
INTRAMUSCULAR | Status: AC
  Filled 2018-04-26: qty 30

## 2018-04-26 MED ORDER — OFF THE BEAT BOOK
Freq: Once | Status: AC
  Administered 2018-04-26: 23:00:00
  Filled 2018-04-26: qty 1

## 2018-04-26 MED ORDER — DOBUTAMINE IN D5W 4-5 MG/ML-% IV SOLN
INTRAVENOUS | Status: AC
  Filled 2018-04-26: qty 250

## 2018-04-26 MED ORDER — ACETAMINOPHEN 325 MG PO TABS
650.0000 mg | ORAL_TABLET | ORAL | Status: DC | PRN
  Administered 2018-04-26: 19:00:00 650 mg via ORAL
  Filled 2018-04-26: qty 2

## 2018-04-26 MED ORDER — SODIUM CHLORIDE 0.9% FLUSH
3.0000 mL | Freq: Two times a day (BID) | INTRAVENOUS | Status: DC
  Administered 2018-04-26: 23:00:00 3 mL via INTRAVENOUS

## 2018-04-26 MED ORDER — LIDOCAINE HCL (CARDIAC) PF 100 MG/5ML IV SOSY
PREFILLED_SYRINGE | INTRAVENOUS | Status: DC | PRN
  Administered 2018-04-26: 100 mg via INTRAVENOUS

## 2018-04-26 MED ORDER — HEPARIN (PORCINE) IN NACL 1000-0.9 UT/500ML-% IV SOLN
INTRAVENOUS | Status: DC | PRN
  Administered 2018-04-26 (×5): 500 mL

## 2018-04-26 MED ORDER — DICYCLOMINE HCL 20 MG PO TABS: 20.0000 mg | ORAL_TABLET | Freq: Three times a day (TID) | ORAL | Status: DC | PRN

## 2018-04-26 MED ORDER — BUPIVACAINE HCL (PF) 0.25 % IJ SOLN
INTRAMUSCULAR | Status: DC | PRN
  Administered 2018-04-26: 30 mL

## 2018-04-26 MED ORDER — SODIUM CHLORIDE 0.9% FLUSH: 3.0000 mL | INTRAVENOUS | Status: DC | PRN

## 2018-04-26 MED ORDER — METOPROLOL SUCCINATE ER 25 MG PO TB24
12.5000 mg | ORAL_TABLET | Freq: Every day | ORAL | Status: DC
  Administered 2018-04-26 – 2018-04-27 (×2): 12.5 mg via ORAL
  Filled 2018-04-26 (×2): qty 1

## 2018-04-26 MED ORDER — LISINOPRIL 10 MG PO TABS
20.0000 mg | ORAL_TABLET | Freq: Every day | ORAL | Status: DC
  Administered 2018-04-27: 10:00:00 20 mg via ORAL
  Filled 2018-04-26 (×2): qty 2

## 2018-04-26 MED ORDER — PROTAMINE SULFATE 10 MG/ML IV SOLN
INTRAVENOUS | Status: DC | PRN
  Administered 2018-04-26: 50 mg via INTRAVENOUS

## 2018-04-26 MED ORDER — PROPOFOL 10 MG/ML IV BOLUS
INTRAVENOUS | Status: DC | PRN
  Administered 2018-04-26: 50 mg via INTRAVENOUS
  Administered 2018-04-26: 200 mg via INTRAVENOUS

## 2018-04-26 MED ORDER — AMIODARONE HCL 200 MG PO TABS
200.0000 mg | ORAL_TABLET | Freq: Every day | ORAL | Status: DC
  Administered 2018-04-26 – 2018-04-27 (×2): 200 mg via ORAL
  Filled 2018-04-26 (×2): qty 1

## 2018-04-26 MED ORDER — ONDANSETRON HCL 4 MG/2ML IJ SOLN: 4.0000 mg | Freq: Four times a day (QID) | INTRAMUSCULAR | Status: DC | PRN

## 2018-04-26 SURGICAL SUPPLY — 20 items
BAG SNAP BAND KOVER 36X36 (MISCELLANEOUS) ×2 IMPLANT
BLANKET WARM UNDERBOD FULL ACC (MISCELLANEOUS) ×2 IMPLANT
CATH MAPPNG PENTARAY F 2-6-2MM (CATHETERS) IMPLANT
CATH SMTCH THERMOCOOL SF DF (CATHETERS) ×2 IMPLANT
CATH SOUNDSTAR 3D IMAGING (CATHETERS) ×2 IMPLANT
CATH WEBSTER BI DIR CS D-F CRV (CATHETERS) ×2 IMPLANT
COVER SWIFTLINK CONNECTOR (BAG) ×2 IMPLANT
PACK EP LATEX FREE (CUSTOM PROCEDURE TRAY) ×3
PACK EP LF (CUSTOM PROCEDURE TRAY) ×1 IMPLANT
PAD PRO RADIOLUCENT 2001M-C (PAD) ×3 IMPLANT
PATCH CARTO3 (PAD) ×2 IMPLANT
PENTARAY F 2-6-2MM (CATHETERS) ×3
SHEATH AVANTI 11F 11CM (SHEATH) ×2 IMPLANT
SHEATH BAYLIS SUREFLEX  M 8.5 (SHEATH) ×4
SHEATH BAYLIS SUREFLEX M 8.5 (SHEATH) IMPLANT
SHEATH BAYLIS TRANSSEPTAL 98CM (NEEDLE) ×2 IMPLANT
SHEATH PINNACLE 7F 10CM (SHEATH) ×2 IMPLANT
SHEATH PINNACLE 8F 10CM (SHEATH) ×4 IMPLANT
SHEATH PINNACLE 9F 10CM (SHEATH) ×4 IMPLANT
TUBING SMART ABLATE COOLFLOW (TUBING) ×2 IMPLANT

## 2018-04-26 NOTE — H&P (Signed)
Samuel Flynn has presented today for surgery, with the diagnosis of atrial fibrillation.  The various methods of treatment have been discussed with the patient and family. After consideration of risks, benefits and other options for treatment, the patient has consented to  Procedure(s): Catheter ablation as a surgical intervention .  Risks include but not limited to bleeding, tamponade, heart block, stroke, damage to surrounding organs, among others. The patient's history has been reviewed, patient examined, no change in status, stable for surgery.  I have reviewed the patient's chart and labs.  Questions were answered to the patient's satisfaction.    Trang Bouse Curt Bears, MD 04/26/2018 10:20 AM

## 2018-04-26 NOTE — Anesthesia Preprocedure Evaluation (Signed)
Anesthesia Evaluation  Patient identified by MRN, date of birth, ID band Patient awake    Reviewed: Allergy & Precautions, NPO status , Patient's Chart, lab work & pertinent test results  Airway Mallampati: III  TM Distance: >3 FB Neck ROM: Full    Dental no notable dental hx.    Pulmonary Current Smoker, former smoker,    Pulmonary exam normal breath sounds clear to auscultation       Cardiovascular hypertension, Normal cardiovascular exam+ dysrhythmias  Rhythm:Regular Rate:Normal     Neuro/Psych negative neurological ROS     GI/Hepatic Neg liver ROS, PUD,   Endo/Other  Morbid obesity  Renal/GU negative Renal ROS     Musculoskeletal   Abdominal   Peds  Hematology negative hematology ROS (+)   Anesthesia Other Findings   Reproductive/Obstetrics                             Anesthesia Physical  Anesthesia Plan  ASA: III  Anesthesia Plan: General   Post-op Pain Management:    Induction: Intravenous  PONV Risk Score and Plan: 2 and Treatment may vary due to age or medical condition, Ondansetron and Midazolam  Airway Management Planned: Oral ETT  Additional Equipment:   Intra-op Plan:   Post-operative Plan: Extubation in OR  Informed Consent: I have reviewed the patients History and Physical, chart, labs and discussed the procedure including the risks, benefits and alternatives for the proposed anesthesia with the patient or authorized representative who has indicated his/her understanding and acceptance.   Dental advisory given  Plan Discussed with: CRNA  Anesthesia Plan Comments:         Anesthesia Quick Evaluation

## 2018-04-26 NOTE — Progress Notes (Addendum)
Site area: Left  Groin a 7, 11 french venous sheath was removed  Site Prior to Removal:  Level 0  Pressure Applied For 33 MINUTES    Bedrest Beginning at 1645p Manual:  yes Patient Status During Pull:  stable  Post Pull Groin Site:  Level 0  Post Pull Instructions Given:  Yes.    Post Pull Pulses Present:  Yes.    Dressing Applied:  Yes.    Comments:  VS remain stable

## 2018-04-26 NOTE — Discharge Instructions (Signed)
Post procedure care instructions No driving for 4 days. No lifting over 5 lbs for 1 week. No vigorous or sexual activity for 1 week. You may return to work on 05/03/18. Keep procedure site clean & dry. If you notice increased pain, swelling, bleeding or pus, call/return!  You may shower, but no soaking baths/hot tubs/pools for 1 week.    You have an appointment set up with the [facility].  Multiple studies have shown that being followed by a dedicated atrial fibrillation clinic in addition to the standard care you receive from your other physicians improves health. We believe that enrollment in the atrial fibrillation clinic will allow [place] to better care for you.   The phone number to the [facility] is [phone]. The clinic is staffed Monday through Friday from 8:30am to 5pm.  Parking Directions: The clinic is located in the Heart and Vascular Building connected to [facility] 1)From [address] turn on to [address] and go to the 3rd entrance  (Heart and Vascular entrance) on the right. 2)Look to the right for Heart &Vascular Parking Garage. 3)A code for the entrance is required, the code for September is 1600.   4)Take the elevators to the 1st floor. Registration is in the room with the glass walls at the end of the hallway.  If you have any trouble parking or locating the clinic, please dont hesitate to call [phone].

## 2018-04-26 NOTE — Progress Notes (Addendum)
Site area: Right groin a 9 french venous sheaths X2 was removed groin  Site Prior to Removal:  Level 0  Pressure Applied For 40 MINUTES    Bedrest Beginning at 1645p  Manual:   Yes.    Patient Status During Pull:  stable  Post Pull Groin Site:  Level 0  Post Pull Instructions Given:  Yes.    Post Pull Pulses Present:  Yes.    Dressing Applied:  Yes.    Comments:  VS remain stable

## 2018-04-26 NOTE — Anesthesia Procedure Notes (Signed)
Procedure Name: Intubation Date/Time: 04/26/2018 11:22 AM Performed by: Shirlyn Goltz, CRNA Pre-anesthesia Checklist: Patient identified, Emergency Drugs available, Suction available and Patient being monitored Patient Re-evaluated:Patient Re-evaluated prior to induction Oxygen Delivery Method: Circle system utilized Preoxygenation: Pre-oxygenation with 100% oxygen Induction Type: IV induction and Rapid sequence Ventilation: Mask ventilation without difficulty, Oral airway inserted - appropriate to patient size and Two handed mask ventilation required Laryngoscope Size: Mac and 4 Grade View: Grade III Tube type: Oral Tube size: 7.5 mm Number of attempts: 1 Airway Equipment and Method: Stylet Placement Confirmation: ETT inserted through vocal cords under direct vision,  positive ETCO2 and breath sounds checked- equal and bilateral Secured at: 23 cm Tube secured with: Tape Dental Injury: Teeth and Oropharynx as per pre-operative assessment

## 2018-04-26 NOTE — Transfer of Care (Signed)
Immediate Anesthesia Transfer of Care Note  Patient: Samuel Flynn  Procedure(s) Performed: ATRIAL FIBRILLATION ABLATION (N/A )  Patient Location: PACU  Anesthesia Type:General  Level of Consciousness: awake and drowsy  Airway & Oxygen Therapy: Patient Spontanous Breathing and Patient connected to nasal cannula oxygen  Post-op Assessment: Report given to RN and Post -op Vital signs reviewed and stable  Post vital signs: Reviewed and stable  Last Vitals:  Vitals Value Taken Time  BP 129/67 04/26/2018  3:02 PM  Temp    Pulse 56 04/26/2018  3:04 PM  Resp 14 04/26/2018  3:04 PM  SpO2 100 % 04/26/2018  3:04 PM  Vitals shown include unvalidated device data.  Last Pain:  Vitals:   04/26/18 0914  TempSrc: Oral         Complications: No apparent anesthesia complications

## 2018-04-27 DIAGNOSIS — Z8679 Personal history of other diseases of the circulatory system: Secondary | ICD-10-CM

## 2018-04-27 DIAGNOSIS — I480 Paroxysmal atrial fibrillation: Secondary | ICD-10-CM | POA: Diagnosis not present

## 2018-04-27 DIAGNOSIS — Z9889 Other specified postprocedural states: Secondary | ICD-10-CM

## 2018-04-27 NOTE — Progress Notes (Signed)
   Progress Note  Patient Name: Samuel Flynn Date of Encounter: 04/27/2018  Primary Cardiologist: Kirk Ruths, MD   Subjective   S/p EP study and catheter ablation of atrial fib.  Inpatient Medications    Scheduled Meds: . allopurinol  100 mg Oral Daily  . amiodarone  200 mg Oral Daily  . lisinopril  20 mg Oral Daily  . metoprolol succinate  12.5 mg Oral Daily  . pantoprazole  40 mg Oral Daily  . rivaroxaban  20 mg Oral Q supper  . sodium chloride flush  3 mL Intravenous Q12H  . spironolactone  12.5 mg Oral Daily   Continuous Infusions: . sodium chloride     PRN Meds: sodium chloride, acetaminophen, colchicine, ibuprofen, ondansetron (ZOFRAN) IV, sodium chloride flush   Vital Signs    Vitals:   04/26/18 2220 04/27/18 0600 04/27/18 0645 04/27/18 0742  BP:   127/71   Pulse: (!) 53 (!) 49 (!) 47   Resp: 16 14 14    Temp:  98.4 F (36.9 C)  98.1 F (36.7 C)  TempSrc:  Axillary  Axillary  SpO2: 98% 98% 99%   Weight:      Height:        Intake/Output Summary (Last 24 hours) at 04/27/2018 0801 Last data filed at 04/26/2018 2029 Gross per 24 hour  Intake 1000 ml  Output 785 ml  Net 215 ml   Filed Weights   04/26/18 0914  Weight: 136.1 kg    Telemetry    nsr - Personally Reviewed  ECG    nsr - Personally Reviewed  Physical Exam   GEN: No acute distress.   Neck: No JVD Cardiac: RRR, no murmurs, rubs, or gallops.  Respiratory: Clear to auscultation bilaterally. GI: Soft, nontender, non-distended  MS: No edema; No deformity. Groins without hematoma or ecchymosis Neuro:  Nonfocal  Psych: Normal affect   Labs    ChemistryNo results for input(s): NA, K, CL, CO2, GLUCOSE, BUN, CREATININE, CALCIUM, PROT, ALBUMIN, AST, ALT, ALKPHOS, BILITOT, GFRNONAA, GFRAA, ANIONGAP in the last 168 hours.   HematologyNo results for input(s): WBC, RBC, HGB, HCT, MCV, MCH, MCHC, RDW, PLT in the last 168 hours.  Cardiac EnzymesNo results for input(s): TROPONINI in  the last 168 hours. No results for input(s): TROPIPOC in the last 168 hours.   BNPNo results for input(s): BNP, PROBNP in the last 168 hours.   DDimer No results for input(s): DDIMER in the last 168 hours.   Radiology    No results found.  Cardiac Studies   none  Patient Profile     42 y.o. male admitted for EPS/RFA of atrial fib  Assessment & Plan    1. Atrial fib - he is s/p EPS/RFA of Atrial fib with minimal residual chest pain. He is stable for DC home. Continue all home meds. May take tylenol or motrin for pain over the next few days as needed.  For questions or updates, please contact [place] Please consult [email or web address] for contact info under Cardiology/STEMI.      Signed, Cristopher Peru, MD  04/27/2018, 8:01 AM  Patient ID: Samuel Flynn, male   DOB: 06/16/1976, 42 y.o.   MRN: [id number]

## 2018-04-27 NOTE — Discharge Summary (Addendum)
Discharge Summary    Patient ID: Samuel Flynn,  MRN: [id number], DOB/AGE: 44-Oct-1977 44 y.o.  Admit date: 04/26/2018 Discharge date: 04/27/2018  Primary Care Provider: Helane Rima Primary Cardiologist: Kirk Ruths, MD  EP: Dr. Curt Bears  Discharge Diagnoses    Active Problems:   Paroxysmal atrial fibrillation [facility])   S/P ablation of atrial fibrillation 04/26/18   Allergies No Known Allergies  Diagnostic Studies/Procedures    Procedures   ATRIAL FIBRILLATION ABLATION 04/26/18 (abbreviated report, see full report note under CV procedures tab)  Conclusion   SURGEON:  Will Camnitz, MD  PREPROCEDURE DIAGNOSES: 1. Paroxysmal atrial fibrillation.  POSTPROCEDURE DIAGNOSES: 1. Paroxysmal  atrial fibrillation.  PROCEDURES: 1. Comprehensive electrophysiologic study. 2. Coronary sinus pacing and recording. 3. Three-dimensional mapping of atrial fibrillation with additional mapping and ablation of a second discrete focus 4. Ablation of atrial fibrillation with additional mapping and ablation of a second discrete focus 5. Intracardiac echocardiography. 6. Transseptal puncture of an intact septum. 7. Arrhythmia induction with pacing with dobutamine infusion   CONCLUSIONS: 1. Sinus rhythm upon presentation.   2. Successful electrical isolation and anatomical encircling of all four pulmonary veins with radiofrequency current. 3. No inducible arrhythmias following ablation both on and off of Isuprel 4. No early apparent complications.   History of Present Illness     Pt is as 42 y/o male with h/o hypertension, obesity, OSA on CPAP, CKD, paroxysmal atrial fibrillation diagnosed in 2017.  Previous to this admission, he was seen in the hospital with palpitations and was found to be in atrial fibrillation.  His heart rate was greater than 200.  He was given adenosine without change.  Amiodarone was given which converted him to atrial flutter and he subsequently had a  cardioversion. He has been on anticoagulation therapy w/ Xarelto. Given his difficult to control atrial fib, he was referred to EP for consideration for ablation. He was seen by Dr. Curt Bears and felt to be a suitable candidate.   Hospital Course     Pt presented to [facility] on [facility]/26/18 for the planned procedure. Procedure performed by Dr. Curt Bears. He underwent successful EPS/RFA of atrial fib. See procedure report for details. Pt was monitored overnight and had no post procedure complications.   On 04/27/18, he was seen and examined by Dr. Lovena Le and felt to be stable for discharge home. Per Dr. Lovena Le, he is to continue all home meds. He may take tylenol or motrin for pain over the next few days as needed.  He has f/u with his primary cardiologist, Dr. Stanford Breed, on 05/02/18. He will have f/u in the Afib clinic on 9/16 and 3 month f/u with Dr. Curt Bears 11/26.    Consultants: none    Discharge Vitals Blood pressure 127/71, pulse (!) 47, temperature 98.1 F (36.7 C), temperature source Axillary, resp. rate 14, height 6\' 2"  (1.88 m), weight 136.1 kg, SpO2 99 %.  Filed Weights   04/26/18 0914  Weight: 136.1 kg    Labs & Radiologic Studies    CBC No results for input(s): WBC, NEUTROABS, HGB, HCT, MCV, PLT in the last 72 hours. Basic Metabolic Panel No results for input(s): NA, K, CL, CO2, GLUCOSE, BUN, CREATININE, CALCIUM, MG, PHOS in the last 72 hours. Liver Function Tests No results for input(s): AST, ALT, ALKPHOS, BILITOT, PROT, ALBUMIN in the last 72 hours. No results for input(s): LIPASE, AMYLASE in the last 72 hours. Cardiac Enzymes No results for input(s): CKTOTAL, CKMB, CKMBINDEX, TROPONINI in the last 72  hours. BNP Invalid input(s): POCBNP D-Dimer No results for input(s): DDIMER in the last 72 hours. Hemoglobin A1C No results for input(s): HGBA1C in the last 72 hours. Fasting Lipid Panel No results for input(s): CHOL, HDL, LDLCALC, TRIG, CHOLHDL, LDLDIRECT in the last 72  hours. Thyroid Function Tests No results for input(s): TSH, T4TOTAL, T3FREE, THYROIDAB in the last 72 hours.  Invalid input(s): FREET3 _____________  Ct Cardiac Morph/pulm Vein W/cm&w/o Ca Score  Addendum Date: 04/22/2018   ADDENDUM REPORT: 04/22/2018 14:49 CLINICAL DATA:  42 year old male with atrial flutter scheduled for an ablation. EXAM: Cardiac CT/CTA TECHNIQUE: The patient was scanned on a Siemens Somatom scanner. FINDINGS: A 120 kV prospective scan was triggered in the descending thoracic aorta at 111 HU's. Gantry rotation speed was 280 msecs and collimation was .9 mm. No beta blockade and no NTG was given. The 3D data set was reconstructed in 5% intervals of the 60-80 % of the R-R cycle. Diastolic phases were analyzed on a dedicated work station using MPR, MIP and VRT modes. The patient received 80 cc of contrast. There is normal pulmonary vein drainage into the left atrium (3 on the right and 2 on the left) with ostial measurements as follows: RUPV: 21.6 x 16.3 mm RMPV: 13.3 x 10.5 mm RLPV: 23.3 x 22.4 mm LUPV: 22.4 x 15.7 mm LLPV: 22.7 x 13.9 The left atrial appendage is medium size with chicken wing morphology and no evidence for a thrombus. The esophagus runs in the left atrial midline and is not in the proximity to any of the pulmonary veins. Aorta:  Normal caliber.  No dissection or calcifications. Aortic Valve:  Trileaflet.  No calcifications. Coronary Arteries: Normal coronary origin. Right dominance. The study was performed without use of NTG and insufficient for plaque evaluation. IMPRESSION: 1. There is normal pulmonary vein drainage into the left atrium. 2. The left atrial appendage is medium size with chicken wing morphology and no evidence for a thrombus. 3. The esophagus runs in the left atrial midline and is not in the proximity to any of the pulmonary veins. 4. Coronary Arteries: Normal coronary origin. Right dominance. The study was performed without use of NTG and insufficient for  plaque evaluation. Calcium score is 0, there is no evidence for CAD. Electronically Signed   By: Ena Dawley   On: 04/22/2018 14:49   Result Date: 04/22/2018 EXAM: OVER-READ INTERPRETATION  CT CHEST The following report is an over-read performed by radiologist Dr. Rolm Baptise of [facility], [place] on 04/22/2018. This over-read does not include interpretation of cardiac or coronary anatomy or pathology. The coronary CTA interpretation by the cardiologist is attached. COMPARISON:  07/11/2008 FINDINGS: Vascular: Heart is upper limits normal in size. Visualized aorta is normal caliber. Mediastinum/Nodes: No adenopathy in the lower mediastinum or hila. Lungs/Pleura: Visualized lungs clear. Upper Abdomen: Imaging into the upper abdomen shows no acute findings. Musculoskeletal: Chest wall soft tissues are unremarkable. No acute bony abnormality. IMPRESSION: No acute or significant extracardiac abnormality. Electronically Signed: By: Rolm Baptise M.D. On: 04/22/2018 12:45   Disposition   Pt is being discharged home today in good condition.  Follow-up Plans & Appointments    Follow-up Information    [facility] Follow up on 05/23/2018.   Specialty:  Cardiology Why:  3:30PM Contact information: [address] [id number] mc [address] [place] [address] [phone]       Constance Haw, MD Follow up on 07/30/2018.   Specialty:  Cardiology Why:  10:30AM  Contact information: [address] [place] [address] [phone]        Lelon Perla, MD Follow up on 05/02/2018.   Specialty:  Cardiology Why:  3:00PM Contact information: [place] [address] [phone]          Discharge Instructions    Diet - low sodium heart healthy   Complete by:  As directed    Increase activity slowly   Complete by:  As directed       Discharge Medications   Allergies as of 04/27/2018   No Known  Allergies     Medication List    TAKE these medications   allopurinol 100 MG tablet Commonly known as:  ZYLOPRIM Take 100 mg by mouth daily. For gout   amiodarone 200 MG tablet Commonly known as:  PACERONE Take 1 tablet (200 mg total) by mouth 2 (two) times daily.   colchicine 0.6 MG tablet Take 0.6 mg by mouth daily as needed (gout flare).   dicyclomine 20 MG tablet Commonly known as:  BENTYL Take 20 mg by mouth 3 (three) times daily as needed for spasms.   FISH OIL PO Take 1 tablet by mouth daily.   ibuprofen 200 MG tablet Commonly known as:  ADVIL,MOTRIN Take 800 mg by mouth every 6 (six) hours as needed for headache or moderate pain.   lisinopril 20 MG tablet Commonly known as:  PRINIVIL,ZESTRIL Take 1 tablet (20 mg total) by mouth daily. For blood pressure   metoprolol succinate 25 MG 24 hr tablet Commonly known as:  TOPROL-XL Take 0.5 tablets (12.5 mg total) by mouth daily.   pantoprazole 40 MG tablet Commonly known as:  PROTONIX Take 1 tablet (40 mg total) by mouth daily. NEED OV.   spironolactone 25 MG tablet Commonly known as:  ALDACTONE Take 0.5 tablets (12.5 mg total) by mouth daily.   XARELTO 20 MG Tabs tablet Generic drug:  rivaroxaban TAKE 1 TABLET (20 MG TOTAL) BY MOUTH DAILY WITH SUPPER.        Acute coronary syndrome (MI, NSTEMI, STEMI, etc) this admission?: No.    Outstanding Labs/Studies   None   Duration of Discharge Encounter   Greater than 30 minutes including physician time.  Signed, Lyda Jester, PA-C 04/27/2018, 8:31 AM  EP Attending  Patient seen and examined. Agree with above. The patient is doing well after Atrial fib ablation. See my note as well. Usual followup.  Mikle Bosworth.D.

## 2018-04-29 ENCOUNTER — Encounter (HOSPITAL_COMMUNITY): Payer: Self-pay | Admitting: Cardiology

## 2018-04-29 LAB — POCT ACTIVATED CLOTTING TIME
ACTIVATED CLOTTING TIME: 290 s
ACTIVATED CLOTTING TIME: 318 s
Activated Clotting Time: 230 seconds
Activated Clotting Time: 285 seconds
Activated Clotting Time: 312 seconds

## 2018-04-29 NOTE — Anesthesia Postprocedure Evaluation (Signed)
Anesthesia Post Note  Patient: Samuel Flynn  Procedure(s) Performed: ATRIAL FIBRILLATION ABLATION (N/A )     Patient location during evaluation: PACU Anesthesia Type: General Level of consciousness: awake and alert Pain management: pain level controlled Vital Signs Assessment: post-procedure vital signs reviewed and stable Respiratory status: spontaneous breathing, nonlabored ventilation and respiratory function stable Cardiovascular status: blood pressure returned to baseline and stable Postop Assessment: no apparent nausea or vomiting Anesthetic complications: no    Last Vitals:  Vitals:   04/27/18 0645 04/27/18 0742  BP: 127/71   Pulse: (!) 47   Resp: 14   Temp:  36.7 C  SpO2: 99%     Last Pain:  Vitals:   04/27/18 0815  TempSrc:   PainSc: 0-No pain                 Lynda Rainwater

## 2018-04-30 NOTE — Progress Notes (Deleted)
HPI: Follow-up atrial fibrillation.  Patient had a nuclear study March 2017 that showed ejection fraction 52% and normal perfusion.  Echo May 2019 showed normal LV function.  Patient underwent successful TEE guided cardioversion.  ETT June 2019 was negative but patient did not achieve target heart rate.  Recurrent atrial flutter July 2019.  Cardiac CTA August 2019 showed calcium score of 0 and no coronary disease.  Underwent atrial fibrillation ablation August 2019.  Since last seen  Current Outpatient Medications  Medication Sig Dispense Refill  . allopurinol (ZYLOPRIM) 100 MG tablet Take 100 mg by mouth daily. For gout    . amiodarone (PACERONE) 200 MG tablet Take 1 tablet (200 mg total) by mouth 2 (two) times daily. 60 tablet 1  . colchicine 0.6 MG tablet Take 0.6 mg by mouth daily as needed (gout flare).   1  . dicyclomine (BENTYL) 20 MG tablet Take 20 mg by mouth 3 (three) times daily as needed for spasms.     Marland Kitchen ibuprofen (ADVIL,MOTRIN) 200 MG tablet Take 800 mg by mouth every 6 (six) hours as needed for headache or moderate pain.    Marland Kitchen lisinopril (PRINIVIL,ZESTRIL) 20 MG tablet Take 1 tablet (20 mg total) by mouth daily. For blood pressure 30 tablet 2  . metoprolol succinate (TOPROL-XL) 25 MG 24 hr tablet Take 0.5 tablets (12.5 mg total) by mouth daily. 30 tablet 2  . Omega-3 Fatty Acids (FISH OIL PO) Take 1 tablet by mouth daily.    . pantoprazole (PROTONIX) 40 MG tablet Take 1 tablet (40 mg total) by mouth daily. NEED OV. 30 tablet 2  . spironolactone (ALDACTONE) 25 MG tablet Take 0.5 tablets (12.5 mg total) by mouth daily. 30 tablet 3  . XARELTO 20 MG TABS tablet TAKE 1 TABLET (20 MG TOTAL) BY MOUTH DAILY WITH SUPPER. 30 tablet 0   No current facility-administered medications for this visit.      Past Medical History:  Diagnosis Date  . Atrial fibrillation with RVR ([place])   . Cellulitis 04/2016   RT FOOT  . Gastric ulcer   . Gout   . Hypertension   . Paroxysmal A-fib  ([place])   . Sleep apnea    USES CPAP  . Wears glasses     Past Surgical History:  Procedure Laterality Date  . ATRIAL FIBRILLATION ABLATION N/A 04/26/2018   Procedure: ATRIAL FIBRILLATION ABLATION;  Surgeon: Constance Haw, MD;  Location: [facility];  Service: Cardiovascular;  Laterality: N/A;  . CARDIOVERSION N/A 01/18/2018   Procedure: CARDIOVERSION;  Surgeon: Larey Dresser, MD;  Location: [facility];  Service: Cardiovascular;  Laterality: N/A;  . ELBOW LIGAMENT RECONSTRUCTION Right 09/29/2014   Procedure: Merian Capron FRACTURE FIXATION, POSSIBLE LIGAMENT REPAIR;  Surgeon: Nita Sells, MD;  Location: [facility];  Service: Orthopedics;  Laterality: Right;  Right elbow coranoid fracture fixation, possible ligament repair  . TEE WITHOUT CARDIOVERSION N/A 01/18/2018   Procedure: TRANSESOPHAGEAL ECHOCARDIOGRAM (TEE);  Surgeon: Larey Dresser, MD;  Location: [facility];  Service: Cardiovascular;  Laterality: N/A;  . WISDOM TOOTH EXTRACTION      Social History   Socioeconomic History  . Marital status: Married    Spouse name: Not on file  . Number of children: Not on file  . Years of education: Not on file  . Highest education level: Not on file  Occupational History  . Occupation: Truck Education administrator: [employer]  .  Financial resource strain: Not on file  . Food insecurity:    Worry: Not on file    Inability: Not on file  . Transportation needs:    Medical: Not on file    Non-medical: Not on file  Tobacco Use  . Smoking status: Former Smoker    Types: Cigars    Last attempt to quit: 02/03/2016    Years since quitting: 2.2  . Smokeless tobacco: Never Used  Substance and Sexual Activity  . Alcohol use: Yes    Alcohol/week: 0.0 standard drinks    Comment: occ  . Drug use: No  . Sexual activity: Yes    Comment: 5 a day  Lifestyle  . Physical activity:    Days per week: Not on file    Minutes per session: Not  on file  . Stress: Not on file  Relationships  . Social connections:    Talks on phone: Not on file    Gets together: Not on file    Attends religious service: Not on file    Active member of club or organization: Not on file    Attends meetings of clubs or organizations: Not on file    Relationship status: Not on file  . Intimate partner violence:    Fear of current or ex partner: Not on file    Emotionally abused: Not on file    Physically abused: Not on file    Forced sexual activity: Not on file  Other Topics Concern  . Not on file  Social History Narrative  . Not on file    Family History  Problem Relation Age of Onset  . Hypertension Paternal Uncle   . Hypertension Maternal Grandmother   . Hypertension Paternal Uncle   . Hypertension Paternal Uncle   . Hypertension Paternal Uncle   . Hypertension Paternal Uncle   . Stroke Father     ROS: no fevers or chills, productive cough, hemoptysis, dysphasia, odynophagia, melena, hematochezia, dysuria, hematuria, rash, seizure activity, orthopnea, PND, pedal edema, claudication. Remaining systems are negative.  Physical Exam: Well-developed well-nourished in no acute distress.  Skin is warm and dry.  HEENT is normal.  Neck is supple.  Chest is clear to auscultation with normal expansion.  Cardiovascular exam is regular rate and rhythm.  Abdominal exam nontender or distended. No masses palpated. Extremities show no edema. neuro grossly intact  ECG- personally reviewed  A/P  1  Kirk Ruths, MD

## 2018-05-02 ENCOUNTER — Ambulatory Visit: Payer: PRIVATE HEALTH INSURANCE | Admitting: Cardiology

## 2018-05-03 ENCOUNTER — Encounter (HOSPITAL_COMMUNITY): Payer: Self-pay | Admitting: Cardiology

## 2018-05-20 ENCOUNTER — Ambulatory Visit (HOSPITAL_COMMUNITY): Payer: PRIVATE HEALTH INSURANCE | Admitting: Nurse Practitioner

## 2018-05-23 ENCOUNTER — Other Ambulatory Visit: Payer: Self-pay | Admitting: Physician Assistant

## 2018-05-23 NOTE — Telephone Encounter (Signed)
This is Dr. [place]'s pt.  °

## 2018-05-24 ENCOUNTER — Other Ambulatory Visit: Payer: Self-pay | Admitting: Cardiovascular Disease

## 2018-06-04 ENCOUNTER — Telehealth: Payer: Self-pay | Admitting: Cardiovascular Disease

## 2018-06-04 NOTE — Telephone Encounter (Signed)
Spoke with Judson Roch from [employer] who state she faxed over an assessment form that needs to be filled out to see if pt qualifies for case management which is covered under his insurance, and wanted to inquire if we've received it. Informed Sarah that I would check with Dr. Blenda Mounts nurse and have her call her back. Also provided fax number to resend form.

## 2018-06-04 NOTE — Telephone Encounter (Signed)
New message:      Judson Roch from [facility] is calling to follow up on an assessment that was sent over for the pt.

## 2018-07-02 NOTE — Telephone Encounter (Signed)
Spoke with Judson Roch, forms not received. Will fax last office note to [phone] as requested

## 2018-07-02 NOTE — Telephone Encounter (Signed)
Faxed to [phone], confirmation received

## 2018-07-02 NOTE — Telephone Encounter (Signed)
Follow Up:     Samuel Flynn is calling to check on the status of the fax that was sent over on 06-04-18.  If not, please send most recent office note.

## 2018-07-10 ENCOUNTER — Encounter (HOSPITAL_COMMUNITY): Payer: Self-pay | Admitting: *Deleted

## 2018-07-10 ENCOUNTER — Emergency Department (HOSPITAL_COMMUNITY): Payer: PRIVATE HEALTH INSURANCE

## 2018-07-10 ENCOUNTER — Emergency Department (HOSPITAL_COMMUNITY)
Admission: EM | Admit: 2018-07-10 | Discharge: 2018-07-10 | Disposition: A | Discharge status: Home / Self Care | Payer: PRIVATE HEALTH INSURANCE | Attending: Emergency Medicine | Admitting: Emergency Medicine

## 2018-07-10 DIAGNOSIS — J4521 Mild intermittent asthma with (acute) exacerbation: Secondary | ICD-10-CM | POA: Diagnosis not present

## 2018-07-10 DIAGNOSIS — R0989 Other specified symptoms and signs involving the circulatory and respiratory systems: Secondary | ICD-10-CM

## 2018-07-10 DIAGNOSIS — J811 Chronic pulmonary edema: Secondary | ICD-10-CM | POA: Diagnosis not present

## 2018-07-10 DIAGNOSIS — I10 Essential (primary) hypertension: Secondary | ICD-10-CM | POA: Diagnosis not present

## 2018-07-10 DIAGNOSIS — Z87891 Personal history of nicotine dependence: Secondary | ICD-10-CM | POA: Insufficient documentation

## 2018-07-10 DIAGNOSIS — Z79899 Other long term (current) drug therapy: Secondary | ICD-10-CM | POA: Insufficient documentation

## 2018-07-10 DIAGNOSIS — R0602 Shortness of breath: Secondary | ICD-10-CM | POA: Diagnosis present

## 2018-07-10 LAB — CBC WITH DIFFERENTIAL/PLATELET
Abs Immature Granulocytes: 0.06 10*3/uL (ref 0.00–0.07)
BASOS ABS: 0 10*3/uL (ref 0.0–0.1)
BASOS PCT: 1 %
EOS PCT: 4 %
Eosinophils Absolute: 0.2 10*3/uL (ref 0.0–0.5)
HCT: 42.2 % (ref 39.0–52.0)
Hemoglobin: 13.5 g/dL (ref 13.0–17.0)
IMMATURE GRANULOCYTES: 1 %
LYMPHS ABS: 1.4 10*3/uL (ref 0.7–4.0)
LYMPHS PCT: 27 %
MCH: 27.3 pg (ref 26.0–34.0)
MCHC: 32 g/dL (ref 30.0–36.0)
MCV: 85.3 fL (ref 80.0–100.0)
MONO ABS: 0.4 10*3/uL (ref 0.1–1.0)
Monocytes Relative: 7 %
NEUTROS ABS: 3.1 10*3/uL (ref 1.7–7.7)
Neutrophils Relative %: 60 %
PLATELETS: 216 10*3/uL (ref 150–400)
RBC: 4.95 MIL/uL (ref 4.22–5.81)
RDW: 17.4 % — AB (ref 11.5–15.5)
WBC: 5.2 10*3/uL (ref 4.0–10.5)
nRBC: 0 % (ref 0.0–0.2)

## 2018-07-10 LAB — I-STAT CG4 LACTIC ACID, ED: Lactic Acid, Venous: 1.99 mmol/L — ABNORMAL HIGH (ref 0.5–1.9)

## 2018-07-10 LAB — BASIC METABOLIC PANEL
Anion gap: 10 (ref 5–15)
BUN: 19 mg/dL (ref 6–20)
CHLORIDE: 104 mmol/L (ref 98–111)
CO2: 23 mmol/L (ref 22–32)
CREATININE: 1.68 mg/dL — AB (ref 0.61–1.24)
Calcium: 8.7 mg/dL — ABNORMAL LOW (ref 8.9–10.3)
GFR calc Af Amer: 56 mL/min — ABNORMAL LOW (ref >60)
GFR calc non Af Amer: 48 mL/min — ABNORMAL LOW (ref >60)
Glucose, Bld: 103 mg/dL — ABNORMAL HIGH (ref 70–99)
POTASSIUM: 4.8 mmol/L (ref 3.5–5.1)
Sodium: 137 mmol/L (ref 135–145)

## 2018-07-10 LAB — I-STAT TROPONIN, ED: Troponin i, poc: 0 ng/mL (ref 0.00–0.08)

## 2018-07-10 LAB — BRAIN NATRIURETIC PEPTIDE: B NATRIURETIC PEPTIDE 5: 10.3 pg/mL (ref 0.0–100.0)

## 2018-07-10 MED ORDER — OPTICHAMBER DIAMOND MISC
1.0000 | Freq: Once | Status: DC
  Filled 2018-07-10: qty 1

## 2018-07-10 MED ORDER — IPRATROPIUM-ALBUTEROL 0.5-2.5 (3) MG/3ML IN SOLN
3.0000 mL | Freq: Once | RESPIRATORY_TRACT | Status: AC
  Administered 2018-07-10: 3 mL via RESPIRATORY_TRACT
  Filled 2018-07-10: qty 3

## 2018-07-10 MED ORDER — IOPAMIDOL (ISOVUE-370) INJECTION 76%
100.0000 mL | Freq: Once | INTRAVENOUS | Status: AC | PRN
  Administered 2018-07-10: 100 mL via INTRAVENOUS

## 2018-07-10 MED ORDER — ALBUTEROL SULFATE HFA 108 (90 BASE) MCG/ACT IN AERS: 1.0000 | INHALATION_SPRAY | RESPIRATORY_TRACT | Status: DC | PRN

## 2018-07-10 MED ORDER — HYDROCHLOROTHIAZIDE 25 MG PO TABS: 25.0000 mg | ORAL_TABLET | Freq: Every day | ORAL | 0 refills | Status: DC

## 2018-07-10 MED ORDER — PREDNISONE 10 MG (21) PO TBPK: ORAL_TABLET | Freq: Every day | ORAL | 0 refills | Status: DC

## 2018-07-10 MED ORDER — METHYLPREDNISOLONE SODIUM SUCC 125 MG IJ SOLR
125.0000 mg | Freq: Once | INTRAMUSCULAR | Status: AC
  Administered 2018-07-10: 125 mg via INTRAVENOUS
  Filled 2018-07-10: qty 2

## 2018-07-10 MED ORDER — AEROCHAMBER PLUS FLO-VU MEDIUM MISC
1.0000 | Freq: Once | Status: DC
  Filled 2018-07-10: qty 1

## 2018-07-10 MED ORDER — IOPAMIDOL (ISOVUE-370) INJECTION 76%
INTRAVENOUS | Status: AC
  Filled 2018-07-10: qty 100

## 2018-07-10 NOTE — ED Provider Notes (Signed)
[facility] EMERGENCY DEPARTMENT Provider Note   CSN: [id number] Arrival date & time: 07/10/18  1448     History   Chief Complaint Chief Complaint  Patient presents with  . Shortness of Breath    HPI Samuel Flynn is a 42 y.o. male.  Pt presents to the ED today with sob.  He has been sob for the past month.  He has seen his pcp several times and has been treated for pna with several abx (?doxy, amox).  He said he went back to his doctor yesterday, and CXR was worse, so he was told to come here.  The pt denies any fevers.  He did have a cardiac ablation in August for afib and feels like he's remained in nsr.  He is still taking Xarelto.     Past Medical History:  Diagnosis Date  . Atrial fibrillation with RVR ([place])   . Cellulitis 04/2016   RT FOOT  . Gastric ulcer   . Gout   . Hypertension   . Paroxysmal A-fib ([place])   . Sleep apnea    USES CPAP  . Wears glasses     Patient Active Problem List   Diagnosis Date Noted  . S/P ablation of atrial fibrillation 04/26/18 04/27/2018  . Paroxysmal atrial fibrillation ([place]) 04/26/2018  . Atrial flutter ([place]) 03/24/2018  . Atrial fibrillation, rapid ([place]) 01/15/2018  . Failure to attend appointment 04/25/2016  . Cellulitis of right foot 04/13/2016  . Atrial fibrillation with RVR ([place]) 04/10/2016  . Cellulitis of foot, right 04/10/2016  . Elevated troponin I level 04/10/2016  . Atrial fibrillation with rapid ventricular response ([place]) 10/18/2015  . Fracture of coronoid process of ulna, right, closed 09/21/2014  . Essential hypertension, benign 02/19/2014  . DUODENAL ULCER 09/18/2007  . RENAL CYST, LEFT 09/06/2007  . FLANK PAIN, RIGHT 08/26/2007  . PAIN IN THORACIC SPINE 02/18/2007  . BACK PAIN, LUMBAR 01/09/2007  . SYNCOPE 01/03/2007  . Gout 12/27/2006  . MORBID OBESITY 12/27/2006  . CARDIAC ARRHYTHMIA 12/27/2006  . SYMPTOM, DISTURBANCE OF SKIN SENSATION 12/27/2006    Past Surgical History:    Procedure Laterality Date  . ATRIAL FIBRILLATION ABLATION N/A 04/26/2018   Procedure: ATRIAL FIBRILLATION ABLATION;  Surgeon: Constance Haw, MD;  Location: [facility] CV LAB;  Service: Cardiovascular;  Laterality: N/A;  . CARDIOVERSION N/A 01/18/2018   Procedure: CARDIOVERSION;  Surgeon: Larey Dresser, MD;  Location: [facility] ENDOSCOPY;  Service: Cardiovascular;  Laterality: N/A;  . ELBOW LIGAMENT RECONSTRUCTION Right 09/29/2014   Procedure: Merian Capron FRACTURE FIXATION, POSSIBLE LIGAMENT REPAIR;  Surgeon: Nita Sells, MD;  Location: [facility];  Service: Orthopedics;  Laterality: Right;  Right elbow coranoid fracture fixation, possible ligament repair  . TEE WITHOUT CARDIOVERSION N/A 01/18/2018   Procedure: TRANSESOPHAGEAL ECHOCARDIOGRAM (TEE);  Surgeon: Larey Dresser, MD;  Location[facility] ENDOSCOPY;  Service: Cardiovascular;  Laterality: N/A;  . WISDOM TOOTH EXTRACTION          Home Medications    Prior to Admission medications   Medication Sig Start Date End Date Taking? Authorizing Provider  allopurinol (ZYLOPRIM) 100 MG tablet Take 100 mg by mouth daily. For gout   Yes [provider]  colchicine 0.6 MG tablet Take 0.6 mg by mouth daily as needed (gout flare).  08/20/15  Yes [provider]  ibuprofen (ADVIL,MOTRIN) 200 MG tablet Take 800 mg by mouth every 6 (six) hours as needed for headache or moderate pain.   Yes [provider]  lisinopril (PRINIVIL,ZESTRIL) 20 MG tablet Take 1 tablet (20 mg total) by mouth daily. For blood pressure 02/22/18  Yes Barrett, Evelene Croon, PA-C  metoprolol succinate (TOPROL-XL) 25 MG 24 hr tablet Take 0.5 tablets (12.5 mg total) by mouth daily. 04/01/18  Yes Barrett, Evelene Croon, PA-C  Omega-3 Fatty Acids (FISH OIL PO) Take 1 tablet by mouth daily.   Yes [provider]  pantoprazole (PROTONIX) 40 MG tablet TAKE 1 TABLET (40 MG TOTAL) BY MOUTH DAILY. **PT NEEDS OFFICE VISIT. Patient taking  differently: Take 40 mg by mouth daily.  05/23/18  Yes Skeet Latch, MD  spironolactone (ALDACTONE) 25 MG tablet Take 0.5 tablets (12.5 mg total) by mouth daily. 04/01/18  Yes Barrett, Rhonda G, PA-C  XARELTO 20 MG TABS tablet TAKE 1 TABLET (20 MG TOTAL) BY MOUTH DAILY WITH SUPPER. Patient taking differently: Take 20 mg by mouth daily.  05/24/18  Yes Skeet Latch, MD  amiodarone (PACERONE) 200 MG tablet Take 1 tablet (200 mg total) by mouth 2 (two) times daily. Patient not taking: Reported on 07/10/2018 03/26/18   Abigail Butts., PA-C  hydrochlorothiazide (HYDRODIURIL) 25 MG tablet Take 1 tablet (25 mg total) by mouth daily. 07/10/18   Isla Pence, MD  predniSONE (STERAPRED UNI-PAK 21 TAB) 10 MG (21) TBPK tablet Take by mouth daily. Take 6 for 2 days, then 5 for 2 days, then 4 for 2 days, then 3 for 2 days, 2 tabs for 2 days, then 1 tab for 2 days 07/10/18   Isla Pence, MD    Family History Family History  Problem Relation Age of Onset  . Hypertension Paternal Uncle   . Hypertension Maternal Grandmother   . Hypertension Paternal Uncle   . Hypertension Paternal Uncle   . Hypertension Paternal Uncle   . Hypertension Paternal Uncle   . Stroke Father     Social History Social History   Tobacco Use  . Smoking status: Former Smoker    Types: Cigars    Last attempt to quit: 02/03/2016    Years since quitting: 2.4  . Smokeless tobacco: Never Used  Substance Use Topics  . Alcohol use: Yes    Alcohol/week: 0.0 standard drinks    Comment: occ  . Drug use: No     Allergies   Patient has no known allergies.   Review of Systems Review of Systems  Respiratory: Positive for cough and shortness of breath.   All other systems reviewed and are negative.    Physical Exam Updated Vital Signs BP 131/78   Pulse 65   Resp 18   Ht 6\' 2"  (1.88 m)   Wt (!) 138.3 kg   SpO2 97%   BMI 39.16 kg/m   Physical Exam  Constitutional: He is oriented to person, place, and time.  He appears well-developed and well-nourished.  HENT:  Head: Normocephalic and atraumatic.  Right Ear: External ear normal.  Left Ear: External ear normal.  Nose: Nose normal.  Mouth/Throat: Oropharynx is clear and moist.  Eyes: Pupils are equal, round, and reactive to light. Conjunctivae and EOM are normal.  Neck: Normal range of motion. Neck supple.  Cardiovascular: Normal rate, regular rhythm, normal heart sounds and intact distal pulses.  Pulmonary/Chest: Tachypnea noted. He has rhonchi. He has rales.  Abdominal: Soft. Bowel sounds are normal.  Musculoskeletal: Normal range of motion.  Neurological: He is alert and oriented to person, place, and time.  Skin: Skin is warm. Capillary refill takes less than 2 seconds.  Psychiatric: He has a normal mood and affect. His behavior is normal. Judgment and thought content normal.  Nursing note and vitals reviewed.    ED Treatments / Results  Labs (all labs ordered are listed, but only abnormal results are displayed) Labs Reviewed  BASIC METABOLIC PANEL - Abnormal; Notable for the following components:      Result Value   Glucose, Bld 103 (*)    Creatinine, Ser 1.68 (*)    Calcium 8.7 (*)    GFR calc non Af Amer 48 (*)    GFR calc Af Amer 56 (*)    All other components within normal limits  CBC WITH DIFFERENTIAL/PLATELET - Abnormal; Notable for the following components:   RDW 17.4 (*)    All other components within normal limits  I-STAT CG4 LACTIC ACID, ED - Abnormal; Notable for the following components:   Lactic Acid, Venous 1.99 (*)    All other components within normal limits  CULTURE, BLOOD (ROUTINE X 2)  CULTURE, BLOOD (ROUTINE X 2)  BRAIN NATRIURETIC PEPTIDE  I-STAT TROPONIN, ED    EKG EKG Interpretation  Date/Time:  Wednesday July 10 2018 15:06:31 EST Ventricular Rate:  62 PR Interval:    QRS Duration: 91 QT Interval:  394 QTC Calculation: 401 R Axis:   49 Text Interpretation:  Sinus rhythm No significant  change since last tracing Confirmed by Isla Pence [id number]) on 07/10/2018 3:11:31 PM   Radiology Dg Chest 2 View  Result Date: 07/10/2018 CLINICAL DATA:  Shortness of breath.  Cough, congestion, cold sweats EXAM: CHEST - 2 VIEW COMPARISON:  CT chest 04/22/2018 FINDINGS: The heart size and mediastinal contours are within normal limits. Both lungs are clear. The visualized skeletal structures are unremarkable. IMPRESSION: No active cardiopulmonary disease. Electronically Signed   By: Kathreen Devoid   On: 07/10/2018 16:11   Ct Angio Chest Pe W And/or Wo Contrast  Result Date: 07/10/2018 CLINICAL DATA:  Increasing shortness of breath for a month, intermittent chest pain. EXAM: CT ANGIOGRAPHY CHEST WITH CONTRAST TECHNIQUE: Multidetector CT imaging of the chest was performed using the standard protocol during bolus administration of intravenous contrast. Multiplanar CT image reconstructions and MIPs were obtained to evaluate the vascular anatomy. CONTRAST:  149mL ISOVUE-370 IOPAMIDOL (ISOVUE-370) INJECTION 76% COMPARISON:  Coronary CT April 22, 2018 April 22, 2018 and chest radiograph July 10, 2018 FINDINGS: Mild respiratory motion degraded examination. CARDIOVASCULAR: Adequate contrast opacification of the pulmonary artery's. Main pulmonary artery is not enlarged. No pulmonary arterial filling defects to the level of the segmental branches. Heart size is mildly enlarged. No pericardial effusion. Thoracic aorta is normal course and caliber, unremarkable. Pulmonary venous congestion. MEDIASTINUM/NODES: No lymphadenopathy by CT size criteria. LUNGS/PLEURA: Low lung volumes. Tracheobronchial tree is patent, no pneumothorax. Mild bronchial wall thickening. No pleural effusions, focal consolidations, pulmonary nodules or masses. Hazy ground-glass opacities. UPPER ABDOMEN: Non-acute. MUSCULOSKELETAL: Non-acute. Review of the MIP images confirms the above findings. IMPRESSION: 1. No acute pulmonary embolism. 2.  Mild cardiomegaly and vascular congestion. 3. Mild bronchial wall thickening seen with pulmonary edema, bronchitis or reactive airway disease. 4. Hazy ground-glass opacities seen with atelectasis given low lung volumes, pulmonary edema, possible small airway disease. Electronically Signed   By: Elon Alas M.D.   On: 07/10/2018 18:47    Procedures Procedures (including critical care time)  Medications Ordered in ED Medications  iopamidol (ISOVUE-370) 76 % injection (has no administration in time range)  albuterol (PROVENTIL HFA;VENTOLIN HFA) 108 (90 Base) MCG/ACT inhaler 1-2 puff (has  no administration in time range)  AEROCHAMBER PLUS FLO-VU MEDIUM MISC 1 each (has no administration in time range)  methylPREDNISolone sodium succinate (SOLU-MEDROL) 125 mg/2 mL injection 125 mg (125 mg Intravenous Given 07/10/18 1653)  ipratropium-albuterol (DUONEB) 0.5-2.5 (3) MG/3ML nebulizer solution 3 mL (3 mLs Nebulization Given 07/10/18 1653)  iopamidol (ISOVUE-370) 76 % injection 100 mL (100 mLs Intravenous Contrast Given 07/10/18 1814)     Initial Impression / Assessment and Plan / ED Course  I have reviewed the triage vital signs and the nursing notes.  Pertinent labs & imaging results that were available during my care of the patient were reviewed by me and considered in my medical decision making (see chart for details).  Pt is feeling much better after neb.  He does have some mild vascular congestion, so I will put him on hctz for 2 weeks.  He is given an albuterol inhaler plus spacer.  He is instructed to return if worse.  Final Clinical Impressions(s) / ED Diagnoses   Final diagnoses:  Pulmonary vascular congestion  Mild intermittent reactive airway disease with acute exacerbation    ED Discharge Orders         Ordered    hydrochlorothiazide (HYDRODIURIL) 25 MG tablet  Daily     07/10/18 1904    predniSONE (STERAPRED UNI-PAK 21 TAB) 10 MG (21) TBPK tablet  Daily     07/10/18 1904            Isla Pence, MD 07/10/18 1905

## 2018-07-10 NOTE — ED Triage Notes (Signed)
Pt is here for increasing sob which began about a month ago.  Pt reports that he feels like he "is having a panic attack" when it comes on. Pt reports "a little bit of Chest pain" with this.

## 2018-07-10 NOTE — ED Notes (Signed)
Patient verbalizes understanding of discharge instructions. Opportunity for questioning and answers were provided. Armband removed by staff, pt discharged from ED home via POV.  

## 2018-07-15 LAB — CULTURE, BLOOD (ROUTINE X 2)
Culture: NO GROWTH
Culture: NO GROWTH
SPECIAL REQUESTS: ADEQUATE

## 2018-07-28 IMAGING — DX DG CHEST 1V PORT
2 series · 2 of 2 positions shown · non-contrast
Comparison: Priors dating back to 04/10/2016

CLINICAL DATA: Atrial fibrillation all week with chest pain.
Dyspnea.

EXAM:
PORTABLE CHEST 1 VIEW

[chest ap (1 of 2)]
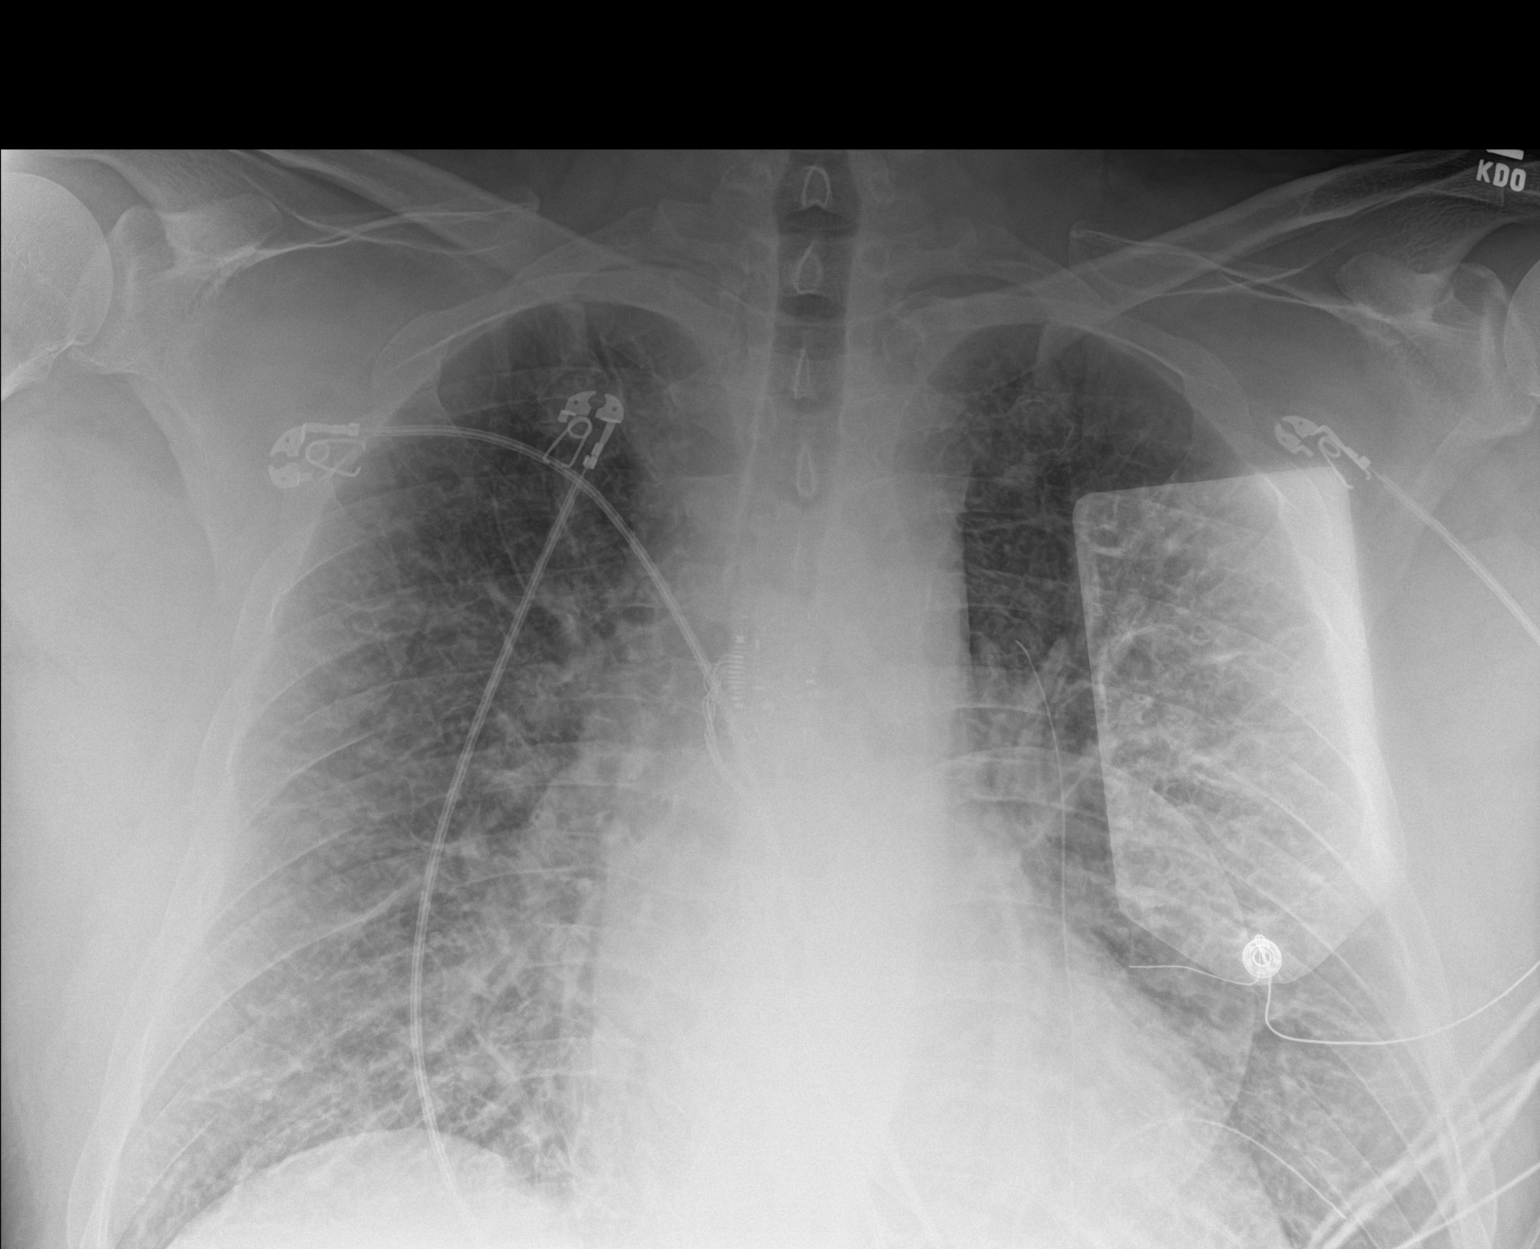

[chest ap (2 of 2)]
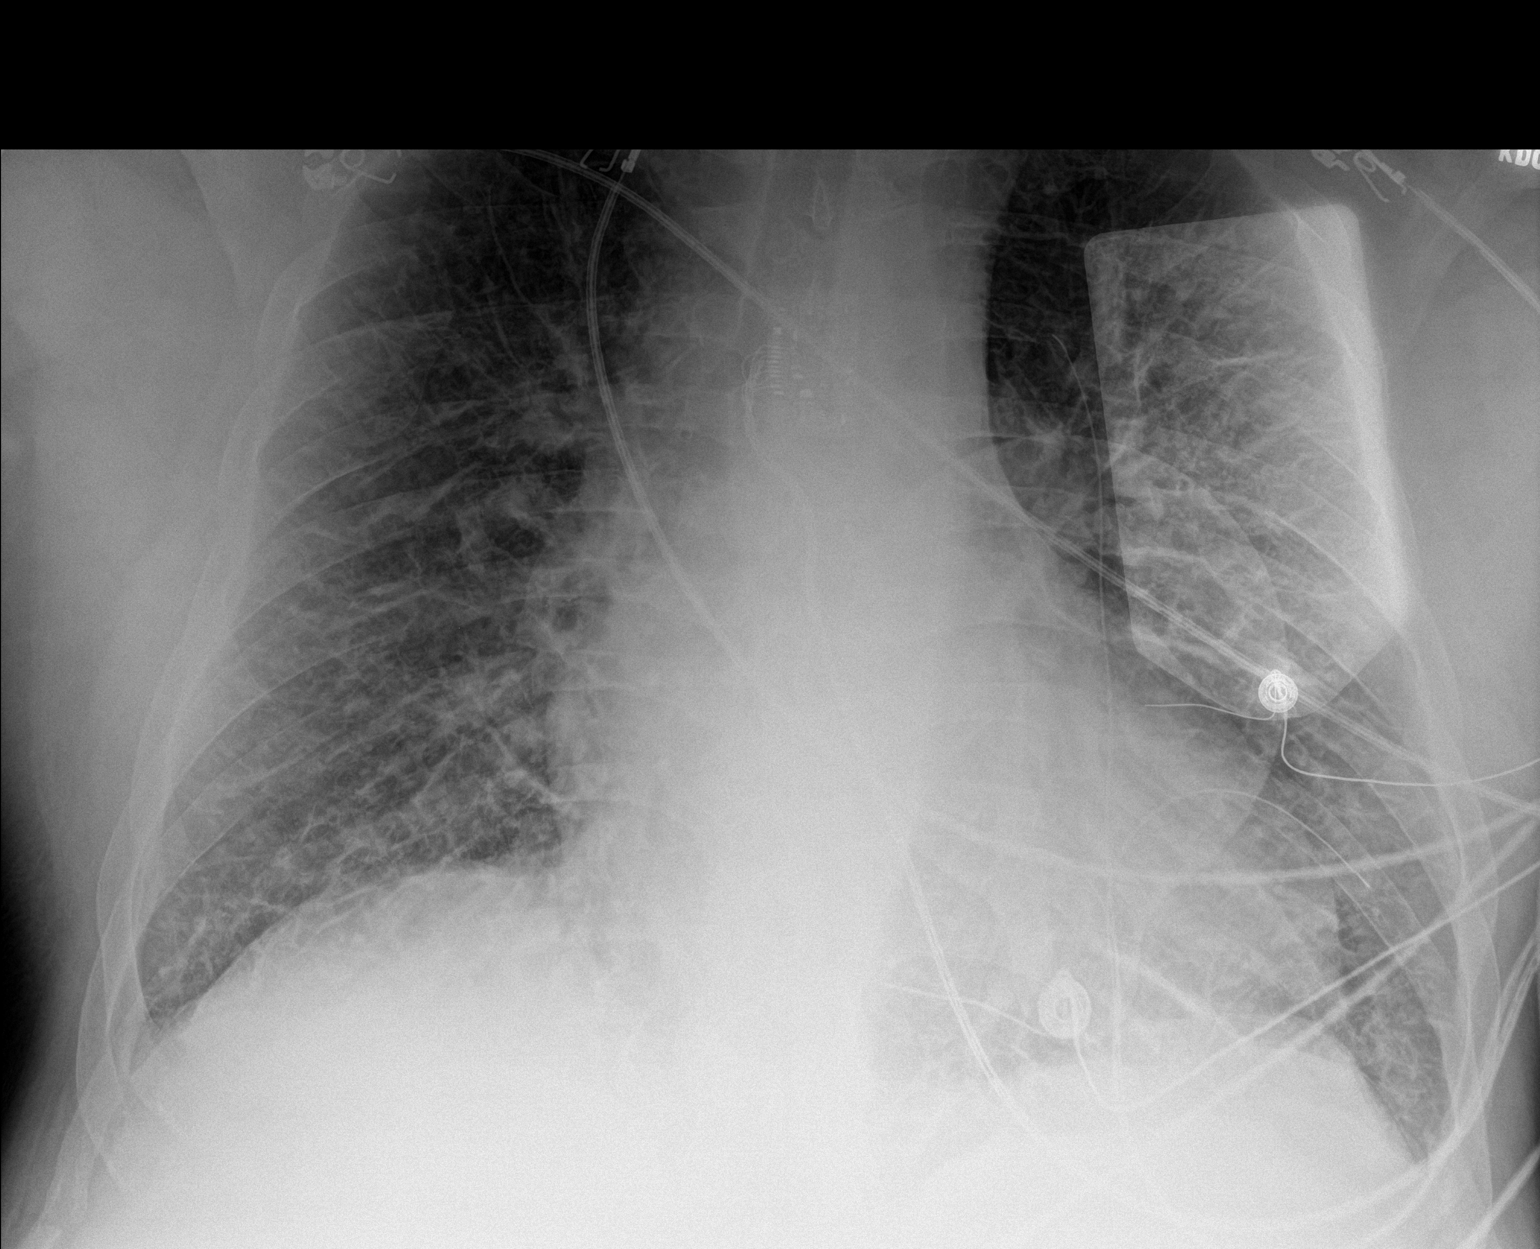

[2 of 2 positions shown; findings below may reference images not displayed]

FINDINGS: Borderline cardiomegaly with aortic atherosclerosis. Mild diffuse
interstitial edema. No pulmonary consolidation. External
defibrillator paddles project over the left hemithorax. No acute
osseous abnormality.
IMPRESSION: Stable mild cardiomegaly with aortic atherosclerosis. Mild increase
in interstitial edema since priors.

## 2018-07-30 ENCOUNTER — Encounter: Payer: Self-pay | Admitting: Cardiology

## 2018-07-30 ENCOUNTER — Ambulatory Visit: Payer: PRIVATE HEALTH INSURANCE | Admitting: Cardiology

## 2018-07-30 VITALS — BP 128/72 | HR 79 | Ht 74.0 in | Wt 315.0 lb

## 2018-07-30 DIAGNOSIS — Z79899 Other long term (current) drug therapy: Secondary | ICD-10-CM | POA: Diagnosis not present

## 2018-07-30 DIAGNOSIS — I4819 Other persistent atrial fibrillation: Secondary | ICD-10-CM | POA: Diagnosis not present

## 2018-07-30 DIAGNOSIS — R0602 Shortness of breath: Secondary | ICD-10-CM

## 2018-07-30 MED ORDER — FUROSEMIDE 20 MG PO TABS: 20.0000 mg | ORAL_TABLET | Freq: Every day | ORAL | 3 refills | Status: DC

## 2018-07-30 NOTE — Progress Notes (Signed)
Electrophysiology Office Note   Date:  07/30/2018   ID:  Samuel Flynn, DOB [birth year], MRN [id number]  PCP:  Helane Rima, MD  Cardiologist:  Oval Linsey Primary Electrophysiologist:  Constance Haw, MD    CC: Follow up for atrial fibrillation post ablation.   History of Present Illness: Samuel Flynn is a 42 y.o. male who is being seen today for the evaluation of atrial fibrillation at the request of Samuel Flynn. Presenting today for electrophysiology evaluation.  A history of hypertension, obesity, OSA on CPAP, CKD, paroxysmal atrial fibrillation diagnosed in 2017.  S/p ablation on 04/26/18. He has done well from a cardiac standpoint since that time. He reports infrequent palpitations which terminate quickly. Patient stopped amiodarone on his own a couple weeks ago. He has not missed a dose of his Xarelto.   However, has has been treated by his PCP for possible pneumonia over the last month. He has also been seen in the ER about three weeks ago for SOB. CXR showed no active disease and BNP was 10. He continues to have issues with SOB.  Today, denies symptoms of palpitations, chest pain, orthopnea, PND, claudication, dizziness, presyncope, syncope, bleeding, or neurologic sequela. The patient is tolerating medications without difficulties.   Past Medical History:  Diagnosis Date  . Atrial fibrillation with RVR ([place])   . Cellulitis 04/2016   RT FOOT  . Gastric ulcer   . Gout   . Hypertension   . Paroxysmal A-fib ([place])   . Sleep apnea    USES CPAP  . Wears glasses    Past Surgical History:  Procedure Laterality Date  . ATRIAL FIBRILLATION ABLATION N/A 04/26/2018   Procedure: ATRIAL FIBRILLATION ABLATION;  Surgeon: Constance Haw, MD;  Location: [facility] CV LAB;  Service: Cardiovascular;  Laterality: N/A;  . CARDIOVERSION N/A 01/18/2018   Procedure: CARDIOVERSION;  Surgeon: Larey Dresser, MD;  Location: [facility] ENDOSCOPY;  Service: Cardiovascular;   Laterality: N/A;  . ELBOW LIGAMENT RECONSTRUCTION Right 09/29/2014   Procedure: Merian Capron FRACTURE FIXATION, POSSIBLE LIGAMENT REPAIR;  Surgeon: Nita Sells, MD;  Location: [facility];  Service: Orthopedics;  Laterality: Right;  Right elbow coranoid fracture fixation, possible ligament repair  . TEE WITHOUT CARDIOVERSION N/A 01/18/2018   Procedure: TRANSESOPHAGEAL ECHOCARDIOGRAM (TEE);  Surgeon: Larey Dresser, MD;  Location[facility] ENDOSCOPY;  Service: Cardiovascular;  Laterality: N/A;  . WISDOM TOOTH EXTRACTION       Current Outpatient Medications  Medication Sig Dispense Refill  . allopurinol (ZYLOPRIM) 100 MG tablet Take 100 mg by mouth daily. For gout    . colchicine 0.6 MG tablet Take 0.6 mg by mouth daily as needed (gout flare).   1  . hydrochlorothiazide (HYDRODIURIL) 25 MG tablet Take 1 tablet (25 mg total) by mouth daily. 14 tablet 0  . ibuprofen (ADVIL,MOTRIN) 200 MG tablet Take 800 mg by mouth every 6 (six) hours as needed for headache or moderate pain.    Marland Kitchen lisinopril (PRINIVIL,ZESTRIL) 20 MG tablet Take 1 tablet (20 mg total) by mouth daily. For blood pressure 30 tablet 2  . metoprolol succinate (TOPROL-XL) 25 MG 24 hr tablet Take 0.5 tablets (12.5 mg total) by mouth daily. 30 tablet 2  . Omega-3 Fatty Acids (FISH OIL PO) Take 1 tablet by mouth daily.    . pantoprazole (PROTONIX) 40 MG tablet TAKE 1 TABLET (40 MG TOTAL) BY MOUTH DAILY. **PT NEEDS OFFICE VISIT. (Patient taking differently: Take 40 mg by mouth daily. ) 30 tablet  2  . spironolactone (ALDACTONE) 25 MG tablet Take 0.5 tablets (12.5 mg total) by mouth daily. 30 tablet 3  . furosemide (LASIX) 20 MG tablet Take 1 tablet (20 mg total) by mouth daily. 30 tablet 3   No current facility-administered medications for this visit.     Allergies:   Patient has no known allergies.   Social History:  The patient  reports that he quit smoking about 2 years ago. His smoking use included cigars. He has never  used smokeless tobacco. He reports that he drinks alcohol. He reports that he does not use drugs.   Family History:  The patient's family history includes Hypertension in his maternal grandmother, paternal uncle, paternal uncle, paternal uncle, paternal uncle, and paternal uncle; Stroke in his father.   ROS:  Please see the history of present illness.   Otherwise, review of systems is positive for SOB, cough, wheezing.   All other systems are reviewed and negative.   PHYSICAL EXAM: VS:  BP 128/72   Pulse 79   Ht 6\' 2"  (1.88 m)   Wt (!) 315 lb (142.9 kg)   BMI 40.44 kg/m  , BMI Body mass index is 40.44 kg/m. GEN: Well nourished, well developed, in no acute distress  HEENT: normal  Neck: no JVD, carotid bruits, or masses Cardiac: RRR; no murmurs, rubs, or gallops,no edema  Respiratory:  Coarse breath sounds with rales bilaterally R>L MS: no deformity or atrophy  Skin: warm and dry Neuro:  Strength and sensation are intact Psych: euthymic mood, full affect  EKG:  EKG is ordered today. Personal review of the ekg ordered shows sinus rhythm HR 79.  Recent Labs: 02/22/2018: Magnesium 2.3 03/24/2018: TSH 3.097 07/10/2018: B Natriuretic Peptide 10.3; BUN 19; Creatinine, Ser 1.68; Hemoglobin 13.5; Platelets 216; Potassium 4.8; Sodium 137    Lipid Panel     Component Value Date/Time   CHOL 211 (H) 01/16/2018 0916   TRIG 323 (H) 01/16/2018 0916   HDL 24 (L) 01/16/2018 0916   CHOLHDL 8.8 01/16/2018 0916   VLDL 65 (H) 01/16/2018 0916   LDLCALC 122 (H) 01/16/2018 0916     Wt Readings from Last 3 Encounters:  07/30/18 (!) 315 lb (142.9 kg)  07/10/18 (!) 305 lb (138.3 kg)  04/26/18 300 lb (136.1 kg)      Other studies Reviewed: Additional studies/ records that were reviewed today include: ED notes, CXR, Labs  ASSESSMENT AND PLAN:  1.  Persistent atrial fibrillation/flutter:  Status post AF ablation 04/26/2018. Patient stopped his amiodarone a couple weeks ago on his own. Will  not resume at this point given sinus rhythm. Will stop Xarelto as he is three months out from ablation and his CHADS2VASC score is 1.   This patients CHA2DS2-VASc Score and unadjusted Ischemic Stroke Rate (% per year) is equal to 0.6 % stroke rate/year from a score of 1  Above score calculated as 1 point each if present [CHF, HTN, DM, Vascular=MI/PAD/Aortic Plaque, Age if 65-74, or Male] Above score calculated as 2 points each if present [Age > 75, or Stroke/TIA/TE]    2.  Obstructive sleep apnea:  CPAP compliance encouraged  3. Shortness of breath: Patient continues to have shortness of breath despite being on antibiotics and prednisone.  Suspect some component of fluid overload as well. Will order CXR and Bmet Start Lasix 20mg  daily Will refer him to new PCP (his last PCP moved).  Current medicines are reviewed at length with the patient today.   The  patient does not have concerns regarding his medicines.  The following changes were made today:  Stop amiodarone, stop Xarelto, start Lasix  Labs/ tests ordered today include:  CXR BMet   Disposition:   FU with Afib clinic in 3 months  Signed, Will Meredith Leeds, MD  07/30/2018 11:42 AM     [facility] [address] [place] [address] [phone] (office) [phone] (fax)  I have seen and examined this patient with Adline Peals.  Agree with above, note added to reflect my findings.  On exam, RRR, no murmurs, course breath sounds with crackles.  He is remained in sinus rhythm since his ablation with only short episodes of atrial fibrillation.  He has had multiple ER visits with respiratory issues.  He has been compliant with his BiPAP and yet continues to have significant shortness of breath.  He has been on antibiotics in the past.  He continues to have crackles.  We will get a chest x-ray and check his kidney function.  We will start him on 20 mg of Lasix a day and refer him to primary  care.  Will M. Camnitz MD 07/30/2018 11:42 AM

## 2018-07-30 NOTE — Patient Instructions (Addendum)
Medication Instructions:  Your physician has recommended you make the following change in your medication:  1. STOP Xarelto 2. STOP Amiodarone 3. START  Lasix (Furosemide) 20 mg daily  * If you need a refill on your cardiac medications before your next appointment, please call your pharmacy.   Labwork: BMET today *We will only notify you of abnormal results, otherwise continue current treatment plan.  Testing/Procedures: A chest x-ray takes a picture of the organs and structures inside the chest, including the heart, lungs, and blood vessels. This test can show several things, including, whether the heart is enlarges; whether fluid is building up in the lungs; and whether pacemaker / defibrillator leads are still in place.  Follow-Up: Your physician recommends that you schedule a follow-up appointment in: 3 months with Clint Fenton (Ricky) in the AFib clinic.  Your physician wants you to follow-up in: 6 months with Dr. Curt Bears.  You will receive a reminder letter in the mail two months in advance. If you don't receive a letter, please call our office to schedule the follow-up appointment.   Thank you for choosing [facility]!!   Trinidad Curet, RN [phone]  Any Other Special Instructions Will Be Listed Below (If Applicable). Please find a primary care provider.  You may call [phone] for a list of provider in your area.

## 2018-07-31 LAB — BASIC METABOLIC PANEL
BUN / CREAT RATIO: 17 (ref 9–20)
BUN: 20 mg/dL (ref 6–24)
CO2: 21 mmol/L (ref 20–29)
CREATININE: 1.2 mg/dL (ref 0.76–1.27)
Calcium: 8.8 mg/dL (ref 8.7–10.2)
Chloride: 104 mmol/L (ref 96–106)
GFR calc Af Amer: 85 mL/min/{1.73_m2} (ref >59)
GFR calc non Af Amer: 73 mL/min/{1.73_m2} (ref >59)
Glucose: 94 mg/dL (ref 65–99)
POTASSIUM: 4.7 mmol/L (ref 3.5–5.2)
SODIUM: 141 mmol/L (ref 134–144)

## 2018-08-07 ENCOUNTER — Ambulatory Visit (INDEPENDENT_AMBULATORY_CARE_PROVIDER_SITE_OTHER): Payer: PRIVATE HEALTH INSURANCE | Admitting: Family Medicine

## 2018-08-07 ENCOUNTER — Encounter: Payer: Self-pay | Admitting: Family Medicine

## 2018-08-07 VITALS — BP 126/83 | HR 73 | Resp 17 | Ht 74.0 in | Wt 309.4 lb

## 2018-08-07 DIAGNOSIS — F411 Generalized anxiety disorder: Secondary | ICD-10-CM

## 2018-08-07 DIAGNOSIS — Z7689 Persons encountering health services in other specified circumstances: Secondary | ICD-10-CM

## 2018-08-07 DIAGNOSIS — I10 Essential (primary) hypertension: Secondary | ICD-10-CM | POA: Diagnosis not present

## 2018-08-07 DIAGNOSIS — R0602 Shortness of breath: Secondary | ICD-10-CM

## 2018-08-07 DIAGNOSIS — J810 Acute pulmonary edema: Secondary | ICD-10-CM

## 2018-08-07 DIAGNOSIS — G4733 Obstructive sleep apnea (adult) (pediatric): Secondary | ICD-10-CM

## 2018-08-07 DIAGNOSIS — F410 Panic disorder [episodic paroxysmal anxiety] without agoraphobia: Secondary | ICD-10-CM

## 2018-08-07 DIAGNOSIS — R0989 Other specified symptoms and signs involving the circulatory and respiratory systems: Secondary | ICD-10-CM

## 2018-08-07 MED ORDER — HYDROXYZINE HCL 25 MG PO TABS: 12.5000 mg | ORAL_TABLET | Freq: Three times a day (TID) | ORAL | 1 refills | Status: DC | PRN

## 2018-08-07 NOTE — Patient Instructions (Addendum)
Thank you for choosing Primary Care at [facility] to be your medical home!    Samuel Flynn was seen by Molli Barrows, FNP today.   Isa Rankin Biegel's primary care provider is Scot Jun, FNP.   For the best care possible, you should try to see Molli Barrows, FNP-C whenever you come to the clinic.   We look forward to seeing you again soon!  If you have any questions about your visit today, please call us at [phone] or feel free to reach your primary care provider via [place].      I have placed an order for your chest x-ray to be completed on 08/29/2018 at [facility] will go to the main entrance and asked the receptionist to directly to the radiology department to have your x-ray completed.  I will follow-up with you by phone once the results are received.  I have discontinued her hydrochlorothiazide.  Continue Lasix as prescribed.  For anxiety I have prescribed you hydroxyzine 25 to 50 mg at bedtime as needed.  You will return for follow-up in 6 weeks.  You will be notified once your test results are received.   Pulmonary Edema Pulmonary edema is abnormal fluid buildup in the lungs that can make it hard to breathe. Follow these instructions at home:  Talk to your doctor about an exercise program.  Eat a healthy diet: ? Eat fresh fruits, vegetables, and lean meats. ? Limit high fat and salty foods. ? Avoid processed, canned, or fried foods. ? Avoid fast food.  Follow your doctor's advice about taking medicine and recording the medicine you take.  Follow your doctor's advice about keeping a record of your weight.  Talk to your doctor about keeping track of your blood pressure.  Do not smoke.  Do not use nicotine patches or nicotine gum.  Make a follow-up appointment with your doctor.  Ask your doctor for a copy of your latest heart tracing (ECG) and keep a copy with you at all times. Get help right away if:  You have chest  pain. THIS IS AN EMERGENCY. Do not wait to see if the pain will go away. Call for local emergency medical help. Do not drive yourself to the hospital.  You have sweating, feel sick to your stomach (nauseous), or are experiencing shortness of breath.  Your weight increases more than your doctor tells you it should.  You start to have shortness of breath.  You notice more swelling in your hands, feet, ankles, or belly.  You have dizziness, blurred vision, headache, or unsteadiness that does not go away.  You cough up bloody spit.  You have a cough that does not go away.  You are unable to sleep because it is hard to breathe.  You begin to feel a "jumping" or "fluttering" sensation (palpitations) in the chest that is unusual for you. This information is not intended to replace advice given to you by your health care provider. Make sure you discuss any questions you have with your health care provider. Document Released: 08/09/2009 Document Revised: 01/27/2016 Document Reviewed: 04/28/2013 Elsevier Interactive Patient Education  [employer].

## 2018-08-07 NOTE — Progress Notes (Signed)
Samuel Flynn, is a 42 y.o. male  QMG:[id number]  TOI:[id number]  DOB - 16-Feb-1976  CC:  Chief Complaint  Patient presents with  . Establish Care    says that cardiologist wants him to be tested for TB since he's taking so long to recover from pneumonia  . Hypertension       HPI: Samuel Flynn is a 42 y.o. male is here today to establish care.   Samuel Flynn has Gout; MORBID OBESITY; CARDIAC ARRHYTHMIA; DUODENAL ULCER; RENAL CYST, LEFT; Essential hypertension, benign; OSA W CPAP  Atrial fibrillation with rapid ventricular response ([place]); Atrial fibrillation with RVR ([place]); Atrial fibrillation, rapid ([place]); Atrial flutter ([place]); Paroxysmal atrial fibrillation ([place]); and S/P ablation of atrial fibrillation 04/26/18 listed as significant medical history.  Today's visit:  [name] is here today to establish care and ER follow-up. Patient developed acute onset atrial fibrillation back in 2017. He was placed on anticoagulants and rate control medication and subsequently had multiple reoccurrences of atrial fibrillation. He underwent an ablation which corrected atrial fibrillation, however feels that his anxiety has been elevated since this procedure. He recently presented to the ER 07/10/2018 with a complaint of shortness of breath after a diagnosis of pneumonia by his PCP and was treated with doxycycline and amoxicillin.  He reported that his shortness of breath and cough were worse.  He subsequently followed up with his PCP and had a subsequent chest x-ray completed which showed worsening symptoms and was sent directly to the ER for further evaluation.  At the ER they did a CT angiogram to rule out PE as the cause as anticoagulation was stopped after he had a successful ablation and converted back to normal sinus rhythm back in August.  CT angiogram was significant for the following:  IMPRESSION: 1. No acute pulmonary embolism. 2. Mild cardiomegaly and vascular congestion. 3. Mild  bronchial wall thickening seen with pulmonary edema, bronchitis or reactive airway disease. 4. Hazy ground-glass opacities seen with atelectasis given low lung volumes, pulmonary edema, possible small airway disease.  Electronically Signed   By: Samuel Flynn M.D.   On: 07/10/2018 18:47  He reports that he was told to follow-up to be evaluated with a QuantiFERON gold test to rule out TB as the cause for his shortness of breath given that his chest x-ray showed some hazy groundglass opacities and he failed treatment with previously prescribed antibiotic therapies.  He reports today some intermittent shortness of breath and continues to have a mild cough.  Although notes moderate improvement in symptoms since his ER visit.  He was placed on extended course of prednisone following his ER visit which she reports completing all of the medication.  Also the ER did place him on HCTZ which was subsequently changed by his cardiologist Lasix 20 mg once daily, prescribed for pulmonary edema noted on his chest x-ray.  Patient denies new headaches, chest pain, abdominal pain, nausea, new weakness, numbness or tingling.   Current medications: Current Outpatient Medications:  .  allopurinol (ZYLOPRIM) 100 MG tablet, Take 100 mg by mouth daily. For gout, Disp: , Rfl:  .  colchicine 0.6 MG tablet, Take 0.6 mg by mouth daily as needed (gout flare). , Disp: , Rfl: 1 .  furosemide (LASIX) 20 MG tablet, Take 1 tablet (20 mg total) by mouth daily., Disp: 30 tablet, Rfl: 3 .  hydrochlorothiazide (HYDRODIURIL) 25 MG tablet, Take 1 tablet (25 mg total) by mouth daily., Disp: 14 tablet, Rfl: 0 .  ibuprofen (ADVIL,MOTRIN)  200 MG tablet, Take 800 mg by mouth every 6 (six) hours as needed for headache or moderate pain., Disp: , Rfl:  .  lisinopril (PRINIVIL,ZESTRIL) 20 MG tablet, Take 1 tablet (20 mg total) by mouth daily. For blood pressure, Disp: 30 tablet, Rfl: 2 .  metoprolol succinate (TOPROL-XL) 25 MG 24 hr  tablet, Take 0.5 tablets (12.5 mg total) by mouth daily., Disp: 30 tablet, Rfl: 2 .  Omega-3 Fatty Acids (FISH OIL PO), Take 1 tablet by mouth daily., Disp: , Rfl:  .  pantoprazole (PROTONIX) 40 MG tablet, TAKE 1 TABLET (40 MG TOTAL) BY MOUTH DAILY. **PT NEEDS OFFICE VISIT. (Patient taking differently: Take 40 mg by mouth daily. ), Disp: 30 tablet, Rfl: 2 .  spironolactone (ALDACTONE) 25 MG tablet, Take 0.5 tablets (12.5 mg total) by mouth daily., Disp: 30 tablet, Rfl: 3   Pertinent family medical history: family history includes Heart attack in his father; Heart disease in his father; Hypertension in his father, maternal grandmother, mother, paternal uncle, paternal uncle, paternal uncle, paternal uncle, and paternal uncle; Stroke in his father.   No Known Allergies  Social History   Socioeconomic History  . Marital status: Married    Spouse name: Not on file  . Number of children: 4  . Years of education: 10  . Highest education level: Not on file  Occupational History  . Occupation: Truck Education administrator: [employer]  . Financial resource strain: Not on file  . Food insecurity:    Worry: Not on file    Inability: Not on file  . Transportation needs:    Medical: Not on file    Non-medical: Not on file  Tobacco Use  . Smoking status: Former Smoker    Types: Cigars    Last attempt to quit: 02/03/2016    Years since quitting: 2.5  . Smokeless tobacco: Never Used  Substance and Sexual Activity  . Alcohol use: Yes    Alcohol/week: 0.0 standard drinks    Comment: occ  . Drug use: No  . Sexual activity: Yes    Comment: 5 a day  Lifestyle  . Physical activity:    Days per week: Not on file    Minutes per session: Not on file  . Stress: Not on file  Relationships  . Social connections:    Talks on phone: Not on file    Gets together: Not on file    Attends religious service: Not on file    Active member of club or organization: Not on file    Attends  meetings of clubs or organizations: Not on file    Relationship status: Not on file  . Intimate partner violence:    Fear of current or ex partner: Not on file    Emotionally abused: Not on file    Physically abused: Not on file    Forced sexual activity: Not on file  Other Topics Concern  . Not on file  Social History Narrative  . Not on file    Review of Systems: Pertinent negatives listed in HPI Objective:   Vitals:   08/07/18 1521  BP: 126/83  Pulse: 73  Resp: 17  SpO2: 95%    BP Readings from Last 3 Encounters:  08/07/18 126/83  07/30/18 128/72  07/10/18 131/78    Filed Weights   08/07/18 1521  Weight: (!) 309 lb 6.4 oz (140.3 kg)      Physical Exam: Constitutional: Patient appears  unhealthy and morbidly obese. HENT: Normocephalic, atraumatic, External right and left ear normal. Oropharynx is clear and moist.  Eyes: Conjunctivae and EOM are normal. PERRLA, no scleral icterus. Neck: Normal ROM. Neck supple. No JVD. No tracheal deviation. No thyromegaly. CVS: RRR, S1/S2 +, no murmurs, no gallops, no carotid bruit.  Pulmonary: Diminished breath sounds throughout lung fields and increased respiratory effort with ambulation. Abdominal: Soft. BS +, no distension, tenderness, rebound or guarding.  Musculoskeletal: Normal range of motion. No edema and no tenderness.  Neuro: Alert. Normal muscle tone coordination. Normal gait.  Skin: Skin is warm and dry. No rash noted. Not diaphoretic. No erythema. No pallor. Psychiatric: Normal mood and affect. Behavior, judgment, thought content normal.  Lab Results (prior encounters)  Lab Results  Component Value Date   WBC 5.2 07/10/2018   HGB 13.5 07/10/2018   HCT 42.2 07/10/2018   MCV 85.3 07/10/2018   PLT 216 07/10/2018   Lab Results  Component Value Date   CREATININE 1.20 07/30/2018   BUN 20 07/30/2018   NA 141 07/30/2018   K 4.7 07/30/2018   CL 104 07/30/2018   CO2 21 07/30/2018    Lab Results  Component  Value Date   HGBA1C 5.7 (H) 03/24/2018       Component Value Date/Time   CHOL 211 (H) 01/16/2018 0916   TRIG 323 (H) 01/16/2018 0916   HDL 24 (L) 01/16/2018 0916   CHOLHDL 8.8 01/16/2018 0916   VLDL 65 (H) 01/16/2018 0916   LDLCALC 122 (H) 01/16/2018 0916        Assessment and plan:  1. Encounter to establish care 2. Essential hypertension, benign Well controlled today. Encouraged DASH diet. Encouraged routine physical exercise as tolerated. Continue to current regimen  3. Pulmonary vascular congestion, likely secondary to chronic lung disease  Checking  QuantiFERON-TB Gold Plus - DG Chest 2 View; Future, repeat chest x-ray 08/29/2018 to evaluate for improvement of pulmonary edema and pneumonia. If image remains abnormal, will refer to pulmonology.   4. Shortness of breath, screen for TB. Low risk, no known high risk factors. - QuantiFERON-TB Gold Plus  5. Anxiety attack Will trial hydroxyzine. If no improvement, will titrate dose.   Meds ordered this encounter  Medications  . hydrOXYzine (ATARAX/VISTARIL) 25 MG tablet    Sig: Take 0.5-1 tablets (12.5-25 mg total) by mouth every 8 (eight) hours as needed for itching.    Dispense:  30 tablet    Refill:  1    A total of 40 minutes spent, greater than 50 % of this time was spent reviewing prior records and diagnostic test results, counseling and coordination of care.     Return in about 6 weeks (around 09/18/2018) for shortness of breath .   The patient was given clear instructions to go to ER or return to medical center if symptoms don't improve, worsen or new problems develop. The patient verbalized understanding. The patient was advised  to call and obtain lab results if they haven't heard anything from out office within 7-10 business days.  Molli Barrows, FNP Primary Care at [facility] [address] [place] [address] [phone]: [phone]    This note has been created with Dragon  speech recognition software and Engineer, materials. Any transcriptional errors are unintentional.

## 2018-08-13 DIAGNOSIS — J810 Acute pulmonary edema: Secondary | ICD-10-CM | POA: Insufficient documentation

## 2018-08-13 DIAGNOSIS — F411 Generalized anxiety disorder: Secondary | ICD-10-CM | POA: Insufficient documentation

## 2018-08-13 DIAGNOSIS — G4733 Obstructive sleep apnea (adult) (pediatric): Secondary | ICD-10-CM | POA: Insufficient documentation

## 2018-08-14 LAB — QUANTIFERON-TB GOLD PLUS
QuantiFERON Nil Value: 0.02 IU/mL
QuantiFERON TB1 Ag Value: 0.02 IU/mL
QuantiFERON TB2 Ag Value: 0.02 IU/mL
QuantiFERON-TB Gold Plus: NEGATIVE

## 2018-08-23 ENCOUNTER — Other Ambulatory Visit: Payer: Self-pay | Admitting: Cardiovascular Disease

## 2018-09-18 ENCOUNTER — Encounter: Payer: Self-pay | Admitting: Family Medicine

## 2018-09-18 ENCOUNTER — Ambulatory Visit (INDEPENDENT_AMBULATORY_CARE_PROVIDER_SITE_OTHER): Payer: PRIVATE HEALTH INSURANCE | Admitting: Family Medicine

## 2018-09-18 VITALS — BP 116/66 | HR 72 | Resp 17 | Ht 74.0 in | Wt 311.0 lb

## 2018-09-18 DIAGNOSIS — Z6839 Body mass index (BMI) 39.0-39.9, adult: Secondary | ICD-10-CM

## 2018-09-18 DIAGNOSIS — G4733 Obstructive sleep apnea (adult) (pediatric): Secondary | ICD-10-CM | POA: Diagnosis not present

## 2018-09-18 DIAGNOSIS — R202 Paresthesia of skin: Secondary | ICD-10-CM | POA: Diagnosis not present

## 2018-09-18 DIAGNOSIS — Z8679 Personal history of other diseases of the circulatory system: Secondary | ICD-10-CM

## 2018-09-18 DIAGNOSIS — R109 Unspecified abdominal pain: Secondary | ICD-10-CM

## 2018-09-18 DIAGNOSIS — R0602 Shortness of breath: Secondary | ICD-10-CM | POA: Diagnosis not present

## 2018-09-18 DIAGNOSIS — Z9889 Other specified postprocedural states: Secondary | ICD-10-CM

## 2018-09-18 DIAGNOSIS — F411 Generalized anxiety disorder: Secondary | ICD-10-CM

## 2018-09-18 DIAGNOSIS — E669 Obesity, unspecified: Secondary | ICD-10-CM

## 2018-09-18 MED ORDER — BUPROPION HCL ER (XL) 150 MG PO TB24: 150.0000 mg | ORAL_TABLET | Freq: Every day | ORAL | 3 refills | Status: DC

## 2018-09-18 MED ORDER — PANTOPRAZOLE SODIUM 40 MG PO TBEC: DELAYED_RELEASE_TABLET | ORAL | 0 refills | Status: DC

## 2018-09-18 MED ORDER — FUROSEMIDE 20 MG PO TABS: 40.0000 mg | ORAL_TABLET | Freq: Every day | ORAL | 3 refills | Status: DC

## 2018-09-18 NOTE — Progress Notes (Signed)
Established Patient Office Visit  Subjective:  Patient ID: Samuel Flynn, male    DOB: [birth year]  Age: 43 y.o. MRN: [id number]  CC:  Chief Complaint  Patient presents with  . Shortness of Breath    mostly resolved    HPI Samuel Flynn presents for for follow-up of shortness of breath.  Patient reports that shortness of breath has mostly resolved.  He remains compliant with medication however was concerned that he had as he has not lost any weight although notes he has not had a great appetite.  He does not perform routine daily weights and is uncertain if his weight is fluctuating. He is compliant with medication and reports frequent urination with diuretic therapy.  He endorses low-sodium diet.  He reports about a week or so ago he did have an episode of chest discomfort with a sensation of fluttering of his heart however he did not follow-up at the ER nor did he notify office.  He has a history of atrial fibrillation.  He complains generalized pain of stomach and back and rapidly jumping from subject to subject during visit today which increases difficulty in ascertaining the source of his symptoms.  He reports compliance with Protonix therapy.  Unable to identify whether or not abdominal pain is related to food intake or certain types of food.  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  He has not attempted relief with any medication.  His current Body mass index is 39.93 kg/m.  He remains an active a physical activity.  He endorses compliance with CPAP machine.  He endorses anxiousness.  He has a history of generalized anxiety disorder. He was prescribed hydroxyzine however reports he was unable to take it due to sedative effects.  He may be interested in starting an SSRI today. Past Medical History:  Diagnosis Date  . Atrial fibrillation with RVR ([place])   . Gastric ulcer   . Gout   . Hypertension   .  Paroxysmal A-fib ([place])   . Sleep apnea    USES CPAP  . Wears glasses     Past Surgical History:  Procedure Laterality Date  . ATRIAL FIBRILLATION ABLATION N/A 04/26/2018   Procedure: ATRIAL FIBRILLATION ABLATION;  Surgeon: Constance Haw, MD;  Location: [facility];  Service: Cardiovascular;  Laterality: N/A;  . CARDIOVERSION N/A 01/18/2018   Procedure: CARDIOVERSION;  Surgeon: Larey Dresser, MD;  Location: [facility] ENDOSCOPY;  Service: Cardiovascular;  Laterality: N/A;  . ELBOW LIGAMENT RECONSTRUCTION Right 09/29/2014   Procedure: Merian Capron FRACTURE FIXATION, POSSIBLE LIGAMENT REPAIR;  Surgeon: Nita Sells, MD;  Location: [facility];  Service: Orthopedics;  Laterality: Right;  Right elbow coranoid fracture fixation, possible ligament repair  . TEE WITHOUT CARDIOVERSION N/A 01/18/2018   Procedure: TRANSESOPHAGEAL ECHOCARDIOGRAM (TEE);  Surgeon: Larey Dresser, MD;  [facility];  Service: Cardiovascular;  Laterality: N/A;  . WISDOM TOOTH EXTRACTION      Family History  Problem Relation Age of Onset  . Hypertension Paternal Uncle   . Hypertension Maternal Grandmother   . Hypertension Paternal Uncle   . Hypertension Paternal Uncle   . Hypertension Paternal Uncle   . Hypertension Paternal Uncle   . Hypertension Mother   . Stroke Father   . Heart disease Father   . Heart attack Father   . Hypertension Father   . Colon cancer Neg Hx  Social History   Socioeconomic History  . Marital status: Married    Spouse name: Not on file  . Number of children: 4  . Years of education: 10  . Highest education level: Not on file  Occupational History  . Occupation: Truck Education administrator: [employer]  . Financial resource strain: Not on file  . Food insecurity:    Worry: Not on file    Inability: Not on file  . Transportation needs:    Medical: Not on file    Non-medical: Not on file  Tobacco Use  . Smoking  status: Former Smoker    Types: Cigars    Last attempt to quit: 02/03/2016    Years since quitting: 2.6  . Smokeless tobacco: Never Used  Substance and Sexual Activity  . Alcohol use: Yes    Alcohol/week: 0.0 standard drinks    Comment: occ  . Drug use: No  . Sexual activity: Yes    Comment: 5 a day  Lifestyle  . Physical activity:    Days per week: Not on file    Minutes per session: Not on file  . Stress: Not on file  Relationships  . Social connections:    Talks on phone: Not on file    Gets together: Not on file    Attends religious service: Not on file    Active member of club or organization: Not on file    Attends meetings of clubs or organizations: Not on file    Relationship status: Not on file  . Intimate partner violence:    Fear of current or ex partner: Not on file    Emotionally abused: Not on file    Physically abused: Not on file    Forced sexual activity: Not on file  Other Topics Concern  . Not on file  Social History Narrative  . Not on file    Outpatient Medications Prior to Visit  Medication Sig Dispense Refill  . allopurinol (ZYLOPRIM) 100 MG tablet Take 100 mg by mouth daily. For gout    . colchicine 0.6 MG tablet Take 0.6 mg by mouth daily as needed (gout flare).   1  . furosemide (LASIX) 20 MG tablet Take 1 tablet (20 mg total) by mouth daily. 30 tablet 3  . hydrOXYzine (ATARAX/VISTARIL) 25 MG tablet Take 0.5-1 tablets (12.5-25 mg total) by mouth every 8 (eight) hours as needed for itching. 30 tablet 1  . ibuprofen (ADVIL,MOTRIN) 200 MG tablet Take 800 mg by mouth every 6 (six) hours as needed for headache or moderate pain.    Marland Kitchen lisinopril (PRINIVIL,ZESTRIL) 20 MG tablet Take 1 tablet (20 mg total) by mouth daily. For blood pressure 30 tablet 2  . metoprolol succinate (TOPROL-XL) 25 MG 24 hr tablet Take 0.5 tablets (12.5 mg total) by mouth daily. 30 tablet 2  . Omega-3 Fatty Acids (FISH OIL PO) Take 1 tablet by mouth daily.    . pantoprazole  (PROTONIX) 40 MG tablet TAKE 1 TABLET (40 MG TOTAL) BY MOUTH DAILY. **PT NEEDS OFFICE VISIT. 30 tablet 0  . spironolactone (ALDACTONE) 25 MG tablet Take 0.5 tablets (12.5 mg total) by mouth daily. 30 tablet 3   No facility-administered medications prior to visit.     No Known Allergies  ROS Review of Systems Pertinent negatives listed in HPI  Objective:    Physical Exam BP 116/66   Pulse 72   Resp 17   Ht 6\' 2"  (1.88  m)   Wt (!) 311 lb (141.1 kg)   SpO2 95%   BMI 39.93 kg/m   Constitutional: Patient appears well-developed and well-nourished. No distress. HENT: Normocephalic, atraumatic, External right and left ear normal.  Eyes: Conjunctivae and EOM are normal. PERRLA, no scleral icterus. Neck: Normal ROM. Neck supple. No JVD. No tracheal deviation. No thyromegaly. CVS: RRR, S1/S2 +, no murmurs, no gallops, no carotid bruit.  Pulmonary: Effort and breath sounds normal, no stridor, rhonchi, wheezes, rales.  Abdominal: Soft. BS +, no distension, tenderness, rebound or guarding.  Musculoskeletal: Normal range of motion. No edema and no tenderness.  Neuro: Alert. Normal reflexes, muscle tone coordination. No cranial nerve deficit. Skin: Skin is warm and dry. No rash noted. Not diaphoretic. No erythema. No pallor. Psychiatric: Normal mood and affect. Behavior, judgment, thought content normal. Wt Readings from Last 3 Encounters:  09/18/18 (!) 311 lb (141.1 kg)  08/07/18 (!) 309 lb 6.4 oz (140.3 kg)  07/30/18 (!) 315 lb (142.9 kg)    Lab Results  Component Value Date   TSH 3.097 03/24/2018   Lab Results  Component Value Date   WBC 5.2 07/10/2018   HGB 13.5 07/10/2018   HCT 42.2 07/10/2018   MCV 85.3 07/10/2018   PLT 216 07/10/2018   Lab Results  Component Value Date   NA 141 07/30/2018   K 4.7 07/30/2018   CO2 21 07/30/2018   GLUCOSE 94 07/30/2018   BUN 20 07/30/2018   CREATININE 1.20 07/30/2018   BILITOT 1.5 (H) 01/18/2017   ALKPHOS 55 01/18/2017   AST 17  01/18/2017   ALT 16 (L) 01/18/2017   PROT 7.2 01/18/2017   ALBUMIN 4.1 01/18/2017   CALCIUM 8.8 07/30/2018   ANIONGAP 10 07/10/2018   Lab Results  Component Value Date   CHOL 211 (H) 01/16/2018   Lab Results  Component Value Date   HDL 24 (L) 01/16/2018   Lab Results  Component Value Date   LDLCALC 122 (H) 01/16/2018   Lab Results  Component Value Date   TRIG 323 (H) 01/16/2018   Lab Results  Component Value Date   CHOLHDL 8.8 01/16/2018   Lab Results  Component Value Date   HGBA1C 5.7 (H) 03/24/2018      Assessment & Plan:  1. Shortness of breath, resolved  Continue CPAP and diuretic therapy to reduce the risk of fluid retention.  2. GAD (generalized anxiety disorder) Will trial Wellbutrin 150 mg once daily  3. OSA (obstructive sleep apnea) Continue CPAP therapy  4. Tingling in extremities Check potassium Potassium  5. Abdominal pain, unspecified abdominal location Increase Protonix to 40 mg twice daily Will refer to GI if symptoms worsen or do not improve  6. S/P ablation of atrial fibrillation 04/26/18 Advised patient to contact cardiology to schedule earlier appointment.  If fluttering sensation occurs again go immediately to the ER for further evaluation and work-up.  Auscultation of the heart regular rhythm rate is also normal     Follow-up: Follow-up in 6 weeks  Molli Barrows, FNP

## 2018-09-19 LAB — POTASSIUM: POTASSIUM: 4.3 mmol/L (ref 3.5–5.2)

## 2018-09-21 ENCOUNTER — Other Ambulatory Visit: Payer: Self-pay | Admitting: Physician Assistant

## 2018-09-21 ENCOUNTER — Other Ambulatory Visit: Payer: Self-pay | Admitting: Cardiovascular Disease

## 2018-10-02 ENCOUNTER — Ambulatory Visit: Payer: PRIVATE HEALTH INSURANCE

## 2018-10-29 ENCOUNTER — Ambulatory Visit (HOSPITAL_COMMUNITY): Payer: PRIVATE HEALTH INSURANCE | Admitting: Physician Assistant

## 2018-11-13 ENCOUNTER — Other Ambulatory Visit: Payer: Self-pay | Admitting: *Deleted

## 2018-12-24 ENCOUNTER — Other Ambulatory Visit: Payer: Self-pay | Admitting: Physician Assistant

## 2018-12-25 ENCOUNTER — Other Ambulatory Visit: Payer: Self-pay | Admitting: Cardiovascular Disease

## 2019-01-17 ENCOUNTER — Other Ambulatory Visit: Payer: Self-pay | Admitting: Cardiology

## 2019-01-17 NOTE — Telephone Encounter (Signed)
[facility] is requesting a refill on Furosemide 2- mg tablet. Would Dr. Curt Bears like to refill this medication? Please address

## 2019-01-20 MED ORDER — FUROSEMIDE 20 MG PO TABS: 40.0000 mg | ORAL_TABLET | Freq: Every day | ORAL | 5 refills | Status: DC

## 2019-01-20 NOTE — Telephone Encounter (Signed)
Pt's medication was sent to pt's pharmacy as requested. Confirmation received.  °

## 2019-01-20 NOTE — Telephone Encounter (Signed)
OK to refill

## 2019-01-21 ENCOUNTER — Other Ambulatory Visit: Payer: Self-pay | Admitting: Family Medicine

## 2019-01-22 ENCOUNTER — Other Ambulatory Visit: Payer: Self-pay

## 2019-01-22 NOTE — Telephone Encounter (Signed)
Refill

## 2019-01-29 ENCOUNTER — Other Ambulatory Visit: Payer: Self-pay

## 2019-01-29 NOTE — Telephone Encounter (Signed)
Refill

## 2019-02-28 ENCOUNTER — Other Ambulatory Visit: Payer: Self-pay | Admitting: Family Medicine

## 2019-02-28 NOTE — Telephone Encounter (Signed)
1) Medication(s) Requested (by name): lisinopril (PRINIVIL,ZESTRIL) 20 MG tablet [[id number]]   2) Pharmacy of Choice: [id number] [place] [address]  3) Special Requests:   Approved medications will be sent to the pharmacy, we will reach out if there is an issue.  Requests made after 3pm may not be addressed until the following business day!  If a patient is unsure of the name of the medication(s) please note and ask patient to call back when they are able to provide all info, do not send to responsible party until all information is available!

## 2019-03-04 MED ORDER — LISINOPRIL 20 MG PO TABS: 20.0000 mg | ORAL_TABLET | Freq: Every day | ORAL | 0 refills | Status: DC

## 2019-03-10 ENCOUNTER — Other Ambulatory Visit: Payer: Self-pay

## 2019-03-10 MED ORDER — METOPROLOL SUCCINATE ER 25 MG PO TB24: 12.5000 mg | ORAL_TABLET | Freq: Every day | ORAL | 2 refills | Status: DC

## 2019-03-20 ENCOUNTER — Other Ambulatory Visit: Payer: Self-pay | Admitting: Family Medicine

## 2019-03-20 NOTE — Telephone Encounter (Signed)
Forwarding medication to PCP for review.

## 2019-03-22 ENCOUNTER — Other Ambulatory Visit: Payer: Self-pay | Admitting: Physician Assistant

## 2019-03-22 ENCOUNTER — Other Ambulatory Visit: Payer: Self-pay | Admitting: Family Medicine

## 2019-03-25 NOTE — Telephone Encounter (Signed)
Pt due for yearly f/u please contact pt last seen 03/2018. Pt needing refills.

## 2019-03-25 NOTE — Telephone Encounter (Signed)
This is Dr. Linwood's pt.  °

## 2019-03-28 ENCOUNTER — Other Ambulatory Visit: Payer: Self-pay | Admitting: Family Medicine

## 2019-03-28 NOTE — Telephone Encounter (Signed)
Forwarding medication refill to PCP for review. 

## 2019-04-11 ENCOUNTER — Ambulatory Visit: Payer: PRIVATE HEALTH INSURANCE | Admitting: Cardiology

## 2019-04-11 DIAGNOSIS — IMO0001 Reserved for inherently not codable concepts without codable children: Secondary | ICD-10-CM | POA: Insufficient documentation

## 2019-04-14 ENCOUNTER — Telehealth: Payer: Self-pay | Admitting: Cardiology

## 2019-04-14 NOTE — Telephone Encounter (Signed)
Called the patient, but, could not leave a VM as it was not set up yet.  Needs followup for med refills.

## 2019-04-25 ENCOUNTER — Other Ambulatory Visit: Payer: Self-pay | Admitting: Family Medicine

## 2019-05-21 ENCOUNTER — Other Ambulatory Visit: Payer: Self-pay | Admitting: Family Medicine

## 2019-07-11 ENCOUNTER — Other Ambulatory Visit: Payer: Self-pay | Admitting: Family Medicine

## 2019-07-13 ENCOUNTER — Emergency Department (HOSPITAL_COMMUNITY): Payer: PRIVATE HEALTH INSURANCE

## 2019-07-13 ENCOUNTER — Other Ambulatory Visit: Payer: Self-pay

## 2019-07-13 ENCOUNTER — Inpatient Hospital Stay (HOSPITAL_COMMUNITY)
Admission: EM | Admit: 2019-07-13 | Discharge: 2019-07-15 | DRG: 308 | Disposition: A | Discharge status: Home / Self Care | Payer: PRIVATE HEALTH INSURANCE | Attending: Cardiovascular Disease | Admitting: Cardiovascular Disease

## 2019-07-13 ENCOUNTER — Encounter (HOSPITAL_COMMUNITY): Payer: Self-pay | Admitting: *Deleted

## 2019-07-13 DIAGNOSIS — I4892 Unspecified atrial flutter: Secondary | ICD-10-CM | POA: Diagnosis present

## 2019-07-13 DIAGNOSIS — I110 Hypertensive heart disease with heart failure: Secondary | ICD-10-CM | POA: Diagnosis present

## 2019-07-13 DIAGNOSIS — Z23 Encounter for immunization: Secondary | ICD-10-CM | POA: Diagnosis not present

## 2019-07-13 DIAGNOSIS — Z87891 Personal history of nicotine dependence: Secondary | ICD-10-CM | POA: Diagnosis not present

## 2019-07-13 DIAGNOSIS — I509 Heart failure, unspecified: Secondary | ICD-10-CM

## 2019-07-13 DIAGNOSIS — Z8679 Personal history of other diseases of the circulatory system: Secondary | ICD-10-CM

## 2019-07-13 DIAGNOSIS — I483 Typical atrial flutter: Secondary | ICD-10-CM | POA: Diagnosis not present

## 2019-07-13 DIAGNOSIS — Z823 Family history of stroke: Secondary | ICD-10-CM | POA: Diagnosis not present

## 2019-07-13 DIAGNOSIS — M109 Gout, unspecified: Secondary | ICD-10-CM | POA: Diagnosis present

## 2019-07-13 DIAGNOSIS — Z7901 Long term (current) use of anticoagulants: Secondary | ICD-10-CM

## 2019-07-13 DIAGNOSIS — I5033 Acute on chronic diastolic (congestive) heart failure: Secondary | ICD-10-CM | POA: Diagnosis present

## 2019-07-13 DIAGNOSIS — I484 Atypical atrial flutter: Secondary | ICD-10-CM

## 2019-07-13 DIAGNOSIS — G4733 Obstructive sleep apnea (adult) (pediatric): Secondary | ICD-10-CM | POA: Diagnosis present

## 2019-07-13 DIAGNOSIS — I480 Paroxysmal atrial fibrillation: Principal | ICD-10-CM | POA: Diagnosis present

## 2019-07-13 DIAGNOSIS — Z20828 Contact with and (suspected) exposure to other viral communicable diseases: Secondary | ICD-10-CM | POA: Diagnosis present

## 2019-07-13 DIAGNOSIS — Z8249 Family history of ischemic heart disease and other diseases of the circulatory system: Secondary | ICD-10-CM | POA: Diagnosis not present

## 2019-07-13 DIAGNOSIS — I10 Essential (primary) hypertension: Secondary | ICD-10-CM | POA: Diagnosis not present

## 2019-07-13 DIAGNOSIS — I340 Nonrheumatic mitral (valve) insufficiency: Secondary | ICD-10-CM | POA: Diagnosis not present

## 2019-07-13 DIAGNOSIS — Z9889 Other specified postprocedural states: Secondary | ICD-10-CM

## 2019-07-13 DIAGNOSIS — I4891 Unspecified atrial fibrillation: Secondary | ICD-10-CM | POA: Diagnosis not present

## 2019-07-13 LAB — SARS CORONAVIRUS 2 (TAT 6-24 HRS): SARS Coronavirus 2: NEGATIVE

## 2019-07-13 LAB — CBC
HCT: 41.3 % (ref 39.0–52.0)
Hemoglobin: 13.7 g/dL (ref 13.0–17.0)
MCH: 27.8 pg (ref 26.0–34.0)
MCHC: 33.2 g/dL (ref 30.0–36.0)
MCV: 83.8 fL (ref 80.0–100.0)
Platelets: 262 10*3/uL (ref 150–400)
RBC: 4.93 MIL/uL (ref 4.22–5.81)
RDW: 17.2 % — ABNORMAL HIGH (ref 11.5–15.5)
WBC: 7.5 10*3/uL (ref 4.0–10.5)
nRBC: 0 % (ref 0.0–0.2)

## 2019-07-13 LAB — BASIC METABOLIC PANEL
Anion gap: 8 (ref 5–15)
BUN: 14 mg/dL (ref 6–20)
CO2: 25 mmol/L (ref 22–32)
Calcium: 8.8 mg/dL — ABNORMAL LOW (ref 8.9–10.3)
Chloride: 106 mmol/L (ref 98–111)
Creatinine, Ser: 1.33 mg/dL — ABNORMAL HIGH (ref 0.61–1.24)
GFR calc Af Amer: >60 mL/min (ref >60)
GFR calc non Af Amer: >60 mL/min (ref >60)
Glucose, Bld: 123 mg/dL — ABNORMAL HIGH (ref 70–99)
Potassium: 4.2 mmol/L (ref 3.5–5.1)
Sodium: 139 mmol/L (ref 135–145)

## 2019-07-13 LAB — TROPONIN I (HIGH SENSITIVITY)
Troponin I (High Sensitivity): 5 ng/L (ref <18)
Troponin I (High Sensitivity): 6 ng/L (ref <18)

## 2019-07-13 LAB — MAGNESIUM: Magnesium: 1.9 mg/dL (ref 1.7–2.4)

## 2019-07-13 LAB — BRAIN NATRIURETIC PEPTIDE: B Natriuretic Peptide: 234.6 pg/mL — ABNORMAL HIGH (ref 0.0–100.0)

## 2019-07-13 MED ORDER — RIVAROXABAN 20 MG PO TABS
20.0000 mg | ORAL_TABLET | Freq: Every day | ORAL | Status: DC
  Administered 2019-07-14: 20 mg via ORAL
  Filled 2019-07-13: qty 1

## 2019-07-13 MED ORDER — AMLODIPINE BESYLATE 10 MG PO TABS
10.0000 mg | ORAL_TABLET | Freq: Every day | ORAL | Status: DC
  Administered 2019-07-14 – 2019-07-15 (×2): 10 mg via ORAL
  Filled 2019-07-13 (×2): qty 1

## 2019-07-13 MED ORDER — FUROSEMIDE 10 MG/ML IJ SOLN
40.0000 mg | Freq: Once | INTRAMUSCULAR | Status: AC
  Administered 2019-07-13: 40 mg via INTRAVENOUS
  Filled 2019-07-13: qty 4

## 2019-07-13 MED ORDER — ALLOPURINOL 100 MG PO TABS
100.0000 mg | ORAL_TABLET | Freq: Every day | ORAL | Status: DC
  Administered 2019-07-14 – 2019-07-15 (×2): 100 mg via ORAL
  Filled 2019-07-13 (×2): qty 1

## 2019-07-13 MED ORDER — DILTIAZEM HCL-DEXTROSE 125-5 MG/125ML-% IV SOLN (PREMIX)
5.0000 mg/h | INTRAVENOUS | Status: DC
  Administered 2019-07-13: 5 mg/h via INTRAVENOUS
  Administered 2019-07-14: 12.5 mg/h via INTRAVENOUS
  Filled 2019-07-13 (×4): qty 125

## 2019-07-13 MED ORDER — BUPROPION HCL ER (XL) 150 MG PO TB24
150.0000 mg | ORAL_TABLET | Freq: Every day | ORAL | Status: DC
  Administered 2019-07-14 – 2019-07-15 (×2): 150 mg via ORAL
  Filled 2019-07-13 (×2): qty 1

## 2019-07-13 MED ORDER — ACETAMINOPHEN 325 MG PO TABS
650.0000 mg | ORAL_TABLET | ORAL | Status: DC | PRN
  Administered 2019-07-13 – 2019-07-14 (×2): 650 mg via ORAL
  Filled 2019-07-13 (×2): qty 2

## 2019-07-13 MED ORDER — LISINOPRIL 20 MG PO TABS
20.0000 mg | ORAL_TABLET | Freq: Every day | ORAL | Status: DC
  Administered 2019-07-14: 20 mg via ORAL
  Filled 2019-07-13: qty 1

## 2019-07-13 MED ORDER — PANTOPRAZOLE SODIUM 40 MG PO TBEC
40.0000 mg | DELAYED_RELEASE_TABLET | Freq: Every day | ORAL | Status: DC
  Administered 2019-07-14 – 2019-07-15 (×2): 40 mg via ORAL
  Filled 2019-07-13 (×2): qty 1

## 2019-07-13 MED ORDER — SODIUM CHLORIDE 0.9% FLUSH
3.0000 mL | Freq: Once | INTRAVENOUS | Status: AC
  Administered 2019-07-13: 3 mL via INTRAVENOUS

## 2019-07-13 MED ORDER — DILTIAZEM LOAD VIA INFUSION
20.0000 mg | Freq: Once | INTRAVENOUS | Status: AC
  Administered 2019-07-13: 20 mg via INTRAVENOUS
  Filled 2019-07-13: qty 20

## 2019-07-13 MED ORDER — ONDANSETRON HCL 4 MG/2ML IJ SOLN: 4.0000 mg | Freq: Four times a day (QID) | INTRAMUSCULAR | Status: DC | PRN

## 2019-07-13 NOTE — ED Triage Notes (Signed)
Pt says that he feels like he has fluid on his lungs because when he lays down he feels like he is choking and can't breathe. Pt says that he feels like his HR is fast, hx of afib, he is on Xarelto and Metoprolol.

## 2019-07-13 NOTE — ED Provider Notes (Addendum)
[facility] EMERGENCY DEPARTMENT Provider Note   CSN: [id number] Arrival date & time: 07/13/19  1455     History   Chief Complaint Chief Complaint  Patient presents with  . Atrial Fibrillation    HPI Samuel Flynn is a 43 y.o. male.     HPI Patient presents with shortness of breath and fast heart rate.  Has been feeling worse over the last month.  History of atrial fibrillation.  Had previous ablation and as of a year ago appears to been out of A. fib, however he states that around February he began to have episodes of it again.  States he started back on his Xarelto at that time.  States over the last month though he is felt to going more and going faster.  Also feels more short of breath now.  States he is on Lasix.  Also on metoprolol.  States he has difficulty laying flat because of trouble breathing increases.  At times has dull left-sided chest pain.  No fevers.  Rare cough. Past Medical History:  Diagnosis Date  . Atrial fibrillation with RVR ([place])   . Gastric ulcer   . Gout   . Hypertension   . Paroxysmal A-fib ([place])   . Sleep apnea    USES CPAP  . Wears glasses     Patient Active Problem List   Diagnosis Date Noted  . Normal coronary arteries 04/11/2019  . OSA (obstructive sleep apnea) 08/13/2018  . GAD (generalized anxiety disorder) 08/13/2018  . Acute pulmonary edema ([place]) 08/13/2018  . S/P ablation of atrial fibrillation 04/26/18 04/27/2018  . Paroxysmal atrial fibrillation ([place]) 04/26/2018  . Essential hypertension, benign 02/19/2014  . Gout 12/27/2006  . Morbid obesity ([place]) 12/27/2006    Past Surgical History:  Procedure Laterality Date  . ATRIAL FIBRILLATION ABLATION N/A 04/26/2018   Procedure: ATRIAL FIBRILLATION ABLATION;  Surgeon: Constance Haw, MD;  Location: [place] [facility];  Service: Cardiovascular;  Laterality: N/A;  . CARDIOVERSION N/A 01/18/2018   Procedure: CARDIOVERSION;  Surgeon: Larey Dresser, MD;   Location: [facility] ENDOSCOPY;  Service: Cardiovascular;  Laterality: N/A;  . ELBOW LIGAMENT RECONSTRUCTION Right 09/29/2014   Procedure: Merian Capron FRACTURE FIXATION, POSSIBLE LIGAMENT REPAIR;  Surgeon: Nita Sells, MD;  Location: [place];  Service: Orthopedics;  Laterality: Right;  Right elbow coranoid fracture fixation, possible ligament repair  . TEE WITHOUT CARDIOVERSION N/A 01/18/2018   Procedure: TRANSESOPHAGEAL ECHOCARDIOGRAM (TEE);  Surgeon: Larey Dresser, MD;  Location: [facility] ENDOSCOPY;  Service: Cardiovascular;  Laterality: N/A;  . WISDOM TOOTH EXTRACTION          Home Medications    Prior to Admission medications   Medication Sig Start Date End Date Taking? Authorizing Provider  allopurinol (ZYLOPRIM) 100 MG tablet Take 100 mg by mouth daily. For gout   Yes [provider]  buPROPion (WELLBUTRIN XL) 150 MG 24 hr tablet TAKE 1 TABLET BY MOUTH EVERY DAY Patient taking differently: Take 150 mg by mouth daily.  03/20/19  Yes Scot Jun, FNP  colchicine 0.6 MG tablet Take 0.6 mg by mouth daily as needed (gout flare).  08/20/15  Yes [provider]  furosemide (LASIX) 20 MG tablet Take 2 tablets (40 mg total) by mouth daily. 01/20/19  Yes Camnitz, Ocie Doyne, MD  ibuprofen (ADVIL,MOTRIN) 200 MG tablet Take 800 mg by mouth every 6 (six) hours as needed for headache or moderate pain.   Yes [provider]  lisinopril (ZESTRIL)  20 MG tablet TAKE 1 TABLET (20 MG TOTAL) BY MOUTH DAILY. FOR BLOOD PRESSURE 04/25/19  Yes Charlott Rakes, MD  metoprolol succinate (TOPROL-XL) 25 MG 24 hr tablet Take 0.5 tablets (12.5 mg total) by mouth daily. 03/10/19  Yes Barrett, Evelene Croon, PA-C  pantoprazole (PROTONIX) 40 MG tablet TAKE 1 TABLET (40 MG TOTAL) BY MOUTH DAILY. **PT NEEDS OFFICE VISIT. Patient taking differently: Take 40 mg by mouth daily.  12/25/18  Yes Scot Jun, FNP  XARELTO 20 MG TABS tablet Take 20 mg by mouth daily. 02/23/19  Yes  [provider]  amLODipine (NORVASC) 10 MG tablet Take 10 mg by mouth daily. 02/08/18   [provider]  hydrOXYzine (ATARAX/VISTARIL) 25 MG tablet Take 0.5-1 tablets (12.5-25 mg total) by mouth every 8 (eight) hours as needed for itching. Patient not taking: Reported on 07/13/2019 08/07/18   Scot Jun, FNP  Omega-3 Fatty Acids (FISH OIL PO) Take 1 tablet by mouth daily.    [provider]  spironolactone (ALDACTONE) 25 MG tablet Take 0.5 tablets (12.5 mg total) by mouth daily. OFFICE VISIT NEEDED Patient not taking: Reported on 07/13/2019 03/27/19   Barrett, Evelene Croon, PA-C    Family History Family History  Problem Relation Age of Onset  . Hypertension Paternal Uncle   . Hypertension Maternal Grandmother   . Hypertension Paternal Uncle   . Hypertension Paternal Uncle   . Hypertension Paternal Uncle   . Hypertension Paternal Uncle   . Hypertension Mother   . Stroke Father   . Heart disease Father   . Heart attack Father   . Hypertension Father   . Colon cancer Neg Hx     Social History Social History   Tobacco Use  . Smoking status: Former Smoker    Types: Cigars    Quit date: 02/03/2016    Years since quitting: 3.4  . Smokeless tobacco: Never Used  Substance Use Topics  . Alcohol use: Yes    Alcohol/week: 0.0 standard drinks    Comment: occ  . Drug use: No     Allergies   Patient has no known allergies.   Review of Systems Review of Systems  Constitutional: Negative for appetite change.  HENT: Negative for congestion.   Respiratory: Positive for shortness of breath.   Cardiovascular: Positive for chest pain, palpitations and leg swelling.  Gastrointestinal: Negative for abdominal pain.  Genitourinary: Negative for flank pain.  Musculoskeletal: Negative for back pain.  Skin: Negative for rash.  Neurological: Negative for weakness.  Psychiatric/Behavioral: Negative for confusion.     Physical Exam Updated Vital Signs BP (!)  134/92   Pulse (!) 161   Temp 98.2 F (36.8 C) (Oral)   Resp (!) 30   SpO2 98%   Physical Exam Vitals signs and nursing note reviewed.  HENT:     Head: Normocephalic.  Eyes:     Pupils: Pupils are equal, round, and reactive to light.  Neck:     Musculoskeletal: Neck supple.  Cardiovascular:     Rate and Rhythm: Tachycardia present. Rhythm irregular.  Pulmonary:     Comments: Few rales and few wheezes. Abdominal:     Tenderness: There is no abdominal tenderness.  Musculoskeletal:     Right lower leg: Edema present.     Left lower leg: Edema present.  Skin:    General: Skin is warm.     Capillary Refill: Capillary refill takes less than 2 seconds.  Neurological:     Mental Status:  He is alert and oriented to person, place, and time.      ED Treatments / Results  Labs (all labs ordered are listed, but only abnormal results are displayed) Labs Reviewed  BASIC METABOLIC PANEL - Abnormal; Notable for the following components:      Result Value   Glucose, Bld 123 (*)    Creatinine, Ser 1.33 (*)    Calcium 8.8 (*)    All other components within normal limits  CBC - Abnormal; Notable for the following components:   RDW 17.2 (*)    All other components within normal limits  BRAIN NATRIURETIC PEPTIDE - Abnormal; Notable for the following components:   B Natriuretic Peptide 234.6 (*)    All other components within normal limits  MAGNESIUM  TROPONIN I (HIGH SENSITIVITY)  TROPONIN I (HIGH SENSITIVITY)    EKG EKG Interpretation  Date/Time:  Sunday July 13 2019 15:32:02 EST Ventricular Rate:  161 PR Interval:    QRS Duration: 85 QT Interval:  280 QTC Calculation: 459 R Axis:   90 Text Interpretation: Atrial flutter with varied AV block, Borderline right axis deviation Nonspecific T abnormalities, lateral leads Confirmed by Davonna Belling [phone]) on 07/13/2019 3:39:21 PM   Radiology Dg Chest Portable 1 View  Result Date: 07/13/2019 CLINICAL DATA:  Shortness  of breath EXAM: PORTABLE CHEST 1 VIEW COMPARISON:  07/10/2018 FINDINGS: Cardiomegaly. Mild, diffuse interstitial pulmonary opacity. The visualized skeletal structures are unremarkable. IMPRESSION: Cardiomegaly with mild, diffuse interstitial pulmonary opacity, likely mild edema. No focal airspace opacity. Electronically Signed   By: Eddie Candle M.D.   On: 07/13/2019 16:10    Procedures Procedures (including critical care time)  Medications Ordered in ED Medications  diltiazem (CARDIZEM) 1 mg/mL load via infusion 20 mg (has no administration in time range)    And  diltiazem (CARDIZEM) 125 mg in dextrose 5% 125 mL (1 mg/mL) infusion (has no administration in time range)  sodium chloride flush (NS) 0.9 % injection 3 mL (3 mLs Intravenous Given 07/13/19 1549)  furosemide (LASIX) injection 40 mg (40 mg Intravenous Given 07/13/19 1703)     Initial Impression / Assessment and Plan / ED Course  I have reviewed the triage vital signs and the nursing notes.  Pertinent labs & imaging results that were available during my care of the patient were reviewed by me and considered in my medical decision making (see chart for details).        Patient with shortness of breath.  Appears to be in CHF, potentially from his atrial fibrillation with RVR.  Unknown how long with been going fast.  States has been taking Xarelto at home although his electrophysiologist had stopped it.  However, not sure with the timing of the A. fib sounds like is been at least a month or 2 since is been going in unsure with the anticoagulation so I do not feel as if he would be a good candidate for ED cardioversion.  Will admit to cardiology.  Started on Cardizem drip and given IV Lasix.  CRITICAL CARE Performed by: Davonna Belling Total critical care time: 30 minutes Critical care time was exclusive of separately billable procedures and treating other patients. Critical care was necessary to treat or prevent imminent or  life-threatening deterioration. Critical care was time spent personally by me on the following activities: development of treatment plan with patient and/or surrogate as well as nursing, discussions with consultants, evaluation of patient's response to treatment, examination of patient, obtaining history from patient or  surrogate, ordering and performing treatments and interventions, ordering and review of laboratory studies, ordering and review of radiographic studies, pulse oximetry and re-evaluation of patient's condition.   Final Clinical Impressions(s) / ED Diagnoses   Final diagnoses:  Atrial fibrillation with RVR (HCC)  Congestive heart failure, unspecified HF chronicity, unspecified heart failure type [facility])    ED Discharge Orders    None       Davonna Belling, MD 07/13/19 1733    Davonna Belling, MD 07/13/19 1734    Davonna Belling, MD 07/13/19 1734

## 2019-07-13 NOTE — H&P (Signed)
Cardiology History & Physical    Patient ID: PARV RASHID MRN: [id number]; DOB: 17-Dec-1975   Admission date: 07/13/2019  Primary Care Provider: Scot Jun, FNP Primary Cardiologist: Skeet Latch, MD  Primary Electrophysiologist:  Will Meredith Leeds, MD   Chief Complaint:  paplitations  Patient Profile:   Samuel Flynn is a 43 y.o. male with a history of AF s/p PVI in 04/2018 who presents for palpitations.  History of Present Illness:   Samuel Flynn reports palpitations for about the past month.  He thinks he has been in A. fib since then.  He also has had worsening dyspnea and chest discomfort.  Today, his symptoms worsened and so he came to the ED.  In the ED, he was found to be an atypical flutter with a heart rate in the 160s.  He was started on an IV diltiazem infusion with good response.  Labs and vitals otherwise unremarkable.  He received 40 mg IV Lasix for mild volume overload.  The patient reports restarting rivaroxaban in February.  He has not missed any doses in the past month.  Heart Pathway Score:      Past Medical History:  Diagnosis Date  . Atrial fibrillation with RVR ([place])   . Gastric ulcer   . Gout   . Hypertension   . Paroxysmal A-fib ([place])   . Sleep apnea    USES CPAP  . Wears glasses     Past Surgical History:  Procedure Laterality Date  . ATRIAL FIBRILLATION ABLATION N/A 04/26/2018   Procedure: ATRIAL FIBRILLATION ABLATION;  Surgeon: Constance Haw, MD;  Location: [place] [facility] LAB;  Service: Cardiovascular;  Laterality: N/A;  . CARDIOVERSION N/A 01/18/2018   Procedure: CARDIOVERSION;  Surgeon: Larey Dresser, MD;  Location[facility] ENDOSCOPY;  Service: Cardiovascular;  Laterality: N/A;  . ELBOW LIGAMENT RECONSTRUCTION Right 09/29/2014   Procedure: Merian Capron FRACTURE FIXATION, POSSIBLE LIGAMENT REPAIR;  Surgeon: Nita Sells, MD;  Location: [place];  Service: Orthopedics;  Laterality: Right;   Right elbow coranoid fracture fixation, possible ligament repair  . TEE WITHOUT CARDIOVERSION N/A 01/18/2018   Procedure: TRANSESOPHAGEAL ECHOCARDIOGRAM (TEE);  Surgeon: Larey Dresser, MD;  Location: [facility] ENDOSCOPY;  Service: Cardiovascular;  Laterality: N/A;  . WISDOM TOOTH EXTRACTION       Medications Prior to Admission: Prior to Admission medications   Medication Sig Start Date End Date Taking? Authorizing Provider  allopurinol (ZYLOPRIM) 100 MG tablet Take 100 mg by mouth daily. For gout   Yes [provider]  buPROPion (WELLBUTRIN XL) 150 MG 24 hr tablet TAKE 1 TABLET BY MOUTH EVERY DAY Patient taking differently: Take 150 mg by mouth daily.  03/20/19  Yes Scot Jun, FNP  colchicine 0.6 MG tablet Take 0.6 mg by mouth daily as needed (gout flare).  08/20/15  Yes [provider]  furosemide (LASIX) 20 MG tablet Take 2 tablets (40 mg total) by mouth daily. 01/20/19  Yes Camnitz, Ocie Doyne, MD  ibuprofen (ADVIL,MOTRIN) 200 MG tablet Take 800 mg by mouth every 6 (six) hours as needed for headache or moderate pain.   Yes [provider]  lisinopril (ZESTRIL) 20 MG tablet TAKE 1 TABLET (20 MG TOTAL) BY MOUTH DAILY. FOR BLOOD PRESSURE 04/25/19  Yes Charlott Rakes, MD  metoprolol succinate (TOPROL-XL) 25 MG 24 hr tablet Take 0.5 tablets (12.5 mg total) by mouth daily. 03/10/19  Yes Barrett, Evelene Croon, PA-C  pantoprazole (PROTONIX) 40 MG tablet TAKE 1 TABLET (  40 MG TOTAL) BY MOUTH DAILY. **PT NEEDS OFFICE VISIT. Patient taking differently: Take 40 mg by mouth daily.  12/25/18  Yes Scot Jun, FNP  XARELTO 20 MG TABS tablet Take 20 mg by mouth daily. 02/23/19  Yes [provider]  amLODipine (NORVASC) 10 MG tablet Take 10 mg by mouth daily. 02/08/18   [provider]  hydrOXYzine (ATARAX/VISTARIL) 25 MG tablet Take 0.5-1 tablets (12.5-25 mg total) by mouth every 8 (eight) hours as needed for itching. Patient not taking: Reported on 07/13/2019  08/07/18   Scot Jun, FNP  Omega-3 Fatty Acids (FISH OIL PO) Take 1 tablet by mouth daily.    [provider]  spironolactone (ALDACTONE) 25 MG tablet Take 0.5 tablets (12.5 mg total) by mouth daily. OFFICE VISIT NEEDED Patient not taking: Reported on 07/13/2019 03/27/19   Barrett, Evelene Croon, PA-C     Allergies:   No Known Allergies  Social History:   Social History   Socioeconomic History  . Marital status: Married    Spouse name: Not on file  . Number of children: 4  . Years of education: 10  . Highest education level: Not on file  Occupational History  . Occupation: Truck Education administrator: [employer]  . Financial resource strain: Not on file  . Food insecurity    Worry: Not on file    Inability: Not on file  . Transportation needs    Medical: Not on file    Non-medical: Not on file  Tobacco Use  . Smoking status: Former Smoker    Types: Cigars    Quit date: 02/03/2016    Years since quitting: 3.4  . Smokeless tobacco: Never Used  Substance and Sexual Activity  . Alcohol use: Yes    Alcohol/week: 0.0 standard drinks    Comment: occ  . Drug use: No  . Sexual activity: Yes    Comment: 5 a day  Lifestyle  . Physical activity    Days per week: Not on file    Minutes per session: Not on file  . Stress: Not on file  Relationships  . Social Herbalist on phone: Not on file    Gets together: Not on file    Attends religious service: Not on file    Active member of club or organization: Not on file    Attends meetings of clubs or organizations: Not on file    Relationship status: Not on file  . Intimate partner violence    Fear of current or ex partner: Not on file    Emotionally abused: Not on file    Physically abused: Not on file    Forced sexual activity: Not on file  Other Topics Concern  . Not on file  Social History Narrative  . Not on file     Family History:   The patient's family history includes Heart  attack in his father; Heart disease in his father; Hypertension in his father, maternal grandmother, mother, paternal uncle, paternal uncle, paternal uncle, paternal uncle, and paternal uncle; Stroke in his father. There is no history of Colon cancer.    ROS:  Please see the history of present illness.  All other ROS reviewed and negative.     Physical Exam/Data:   Vitals:   07/13/19 1930 07/13/19 1945 07/13/19 2000 07/13/19 2015  BP: 115/79 (!) 131/95  107/71  Pulse: 88 83 (!) 103 95  Resp: (!) 25 (!)  24 (!) 22 (!) 25  Temp:      TempSrc:      SpO2: 97% 96% 96% 97%    Intake/Output Summary (Last 24 hours) at 07/13/2019 2036 Last data filed at 07/13/2019 2029 Gross per 24 hour  Intake -  Output 2500 ml  Net -2500 ml   Last 3 Weights 09/18/2018 08/07/2018 07/30/2018  Weight (lbs) 311 lb 309 lb 6.4 oz 315 lb  Weight (kg) 141.069 kg 140.343 kg 142.883 kg     There is no height or weight on file to calculate BMI.  Wt Readings from Last 3 Encounters:  09/18/18 (!) 141.1 kg  08/07/18 (!) 140.3 kg  07/30/18 (!) 142.9 kg    Physical Exam: General: Well developed, well nourished, in no acute distress. Head: Normocephalic, atraumatic, sclera non-icteric, no xanthomas, nares are without discharge.  Neck: Negative for carotid bruits. JVD not elevated. Lungs: Clear bilaterally to auscultation without wheezes, rales, or rhonchi. Breathing is unlabored. Heart: RRR with S1 S2. No murmurs, rubs, or gallops appreciated. Abdomen: Soft, non-tender, non-distended with normoactive bowel sounds. No hepatomegaly. No rebound/guarding. No obvious abdominal masses. Msk:  Strength and tone appear normal for age. Extremities: No clubbing or cyanosis. No edema.  Distal pedal pulses are 2+ and equal bilaterally. Neuro: Alert and oriented X 3. No focal deficit. No facial asymmetry. Moves all extremities spontaneously. Psych:  Responds to questions appropriately with a normal affect.    EKG:  The ECG  that was done atypical AFL  Relevant CV Studies: TTE 01/2018 Study Conclusions  - Left ventricle: The cavity size was normal. There was moderate   focal basal hypertrophy. Systolic function was normal. The   estimated ejection fraction was in the range of 60% to 65%. Wall   motion was normal; there were no regional wall motion   abnormalities. - Mitral valve: Calcified annulus. There was trivial regurgitation. - Tricuspid valve: There was trivial regurgitation.  Laboratory Data:  High Sensitivity Troponin:   Recent Labs  Lab 07/13/19 1510 07/13/19 1818  TROPONINIHS 5 6      Cardiac EnzymesNo results for input(s): TROPONINI in the last 168 hours. No results for input(s): TROPIPOC in the last 168 hours.  Chemistry Recent Labs  Lab 07/13/19 1510  NA 139  K 4.2  CL 106  CO2 25  GLUCOSE 123*  BUN 14  CREATININE 1.33*  CALCIUM 8.8*  GFRNONAA >60  GFRAA >60  ANIONGAP 8    No results for input(s): PROT, ALBUMIN, AST, ALT, ALKPHOS, BILITOT in the last 168 hours. Hematology Recent Labs  Lab 07/13/19 1510  WBC 7.5  RBC 4.93  HGB 13.7  HCT 41.3  MCV 83.8  MCH 27.8  MCHC 33.2  RDW 17.2*  PLT 262   BNP Recent Labs  Lab 07/13/19 1510  BNP 234.6*    DDimer No results for input(s): DDIMER in the last 168 hours.   Radiology/Studies:  Dg Chest Portable 1 View  Result Date: 07/13/2019 CLINICAL DATA:  Shortness of breath EXAM: PORTABLE CHEST 1 VIEW COMPARISON:  07/10/2018 FINDINGS: Cardiomegaly. Mild, diffuse interstitial pulmonary opacity. The visualized skeletal structures are unremarkable. IMPRESSION: Cardiomegaly with mild, diffuse interstitial pulmonary opacity, likely mild edema. No focal airspace opacity. Electronically Signed   By: Eddie Candle M.D.   On: 07/13/2019 16:10    Assessment and Plan   1. Post PVI atypical AFL ECG and telemetry most consistent with atypical flutter.  Possible that patient has been having A. fib for the past  month and he converted  today and atypical flutter with worsening symptoms and rate control.  Plan continue diltiazem for rate control overnight, n.p.o. at midnight.  Consider catheter ablation of atypical flutter versus cardioversion tomorrow, +/- antiarrhythmic therapy.  Continue rivaroxaban uninterrupted.     Severity of Illness: The appropriate patient status for this patient is INPATIENT. Inpatient status is judged to be reasonable and necessary in order to provide the required intensity of service to ensure the patient's safety. The patient's presenting symptoms, physical exam findings, and initial radiographic and laboratory data in the context of their chronic comorbidities is felt to place them at high risk for further clinical deterioration. Furthermore, it is not anticipated that the patient will be medically stable for discharge from the hospital within 2 midnights of admission. The following factors support the patient status of inpatient.   " The patient's presenting symptoms include tachycardia. " The worrisome physical exam findings include tachycardai. " The initial radiographic and laboratory data are worrisome because of AF. " The chronic co-morbidities include AF.   * I certify that at the point of admission it is my clinical judgment that the patient will require inpatient hospital care spanning beyond 2 midnights from the point of admission due to high intensity of service, high risk for further deterioration and high frequency of surveillance required.*    For questions or updates, please contact [place] Please consult [email or web address] for contact info under      Signed, ,  S, MD 07/13/2019, 8:36 PM

## 2019-07-14 ENCOUNTER — Inpatient Hospital Stay (HOSPITAL_COMMUNITY): Payer: PRIVATE HEALTH INSURANCE

## 2019-07-14 ENCOUNTER — Other Ambulatory Visit: Payer: Self-pay

## 2019-07-14 ENCOUNTER — Encounter (HOSPITAL_COMMUNITY): Payer: Self-pay | Admitting: General Practice

## 2019-07-14 DIAGNOSIS — I483 Typical atrial flutter: Secondary | ICD-10-CM

## 2019-07-14 DIAGNOSIS — I340 Nonrheumatic mitral (valve) insufficiency: Secondary | ICD-10-CM

## 2019-07-14 LAB — BASIC METABOLIC PANEL
Anion gap: 11 (ref 5–15)
BUN: 19 mg/dL (ref 6–20)
CO2: 25 mmol/L (ref 22–32)
Calcium: 8.5 mg/dL — ABNORMAL LOW (ref 8.9–10.3)
Chloride: 104 mmol/L (ref 98–111)
Creatinine, Ser: 1.37 mg/dL — ABNORMAL HIGH (ref 0.61–1.24)
GFR calc Af Amer: >60 mL/min (ref >60)
GFR calc non Af Amer: >60 mL/min (ref >60)
Glucose, Bld: 125 mg/dL — ABNORMAL HIGH (ref 70–99)
Potassium: 4 mmol/L (ref 3.5–5.1)
Sodium: 140 mmol/L (ref 135–145)

## 2019-07-14 LAB — ECHOCARDIOGRAM COMPLETE
Height: 74 in
Weight: 4875.2 oz

## 2019-07-14 LAB — TSH: TSH: 1.95 u[IU]/mL (ref 0.350–4.500)

## 2019-07-14 LAB — GLUCOSE, CAPILLARY: Glucose-Capillary: 114 mg/dL — ABNORMAL HIGH (ref 70–99)

## 2019-07-14 MED ORDER — FUROSEMIDE 10 MG/ML IJ SOLN
40.0000 mg | Freq: Two times a day (BID) | INTRAMUSCULAR | Status: DC
  Administered 2019-07-14: 40 mg via INTRAVENOUS
  Filled 2019-07-14: qty 4

## 2019-07-14 MED ORDER — FUROSEMIDE 10 MG/ML IJ SOLN
40.0000 mg | Freq: Once | INTRAMUSCULAR | Status: AC
  Administered 2019-07-14: 40 mg via INTRAVENOUS
  Filled 2019-07-14: qty 4

## 2019-07-14 MED ORDER — INFLUENZA VAC SPLIT QUAD 0.5 ML IM SUSY
0.5000 mL | PREFILLED_SYRINGE | INTRAMUSCULAR | Status: AC
  Administered 2019-07-15: 0.5 mL via INTRAMUSCULAR
  Filled 2019-07-14: qty 0.5

## 2019-07-14 MED ORDER — DILTIAZEM HCL ER 60 MG PO CP12
120.0000 mg | ORAL_CAPSULE | Freq: Two times a day (BID) | ORAL | Status: DC
  Administered 2019-07-14 – 2019-07-15 (×3): 120 mg via ORAL
  Filled 2019-07-14 (×3): qty 2

## 2019-07-14 NOTE — Progress Notes (Addendum)
Progress Note  Patient Name: Samuel Flynn Date of Encounter: 07/14/2019  Primary Cardiologist: Skeet Latch, MD   Subjective   Converted to NSR overnight. Patient is still feeling weak.   Inpatient Medications    Scheduled Meds: . allopurinol  100 mg Oral Daily  . amLODipine  10 mg Oral Daily  . buPROPion  150 mg Oral Daily  . lisinopril  20 mg Oral Daily  . pantoprazole  40 mg Oral Daily  . rivaroxaban  20 mg Oral Q supper   Continuous Infusions: . diltiazem (CARDIZEM) infusion 12.5 mg/hr (07/14/19 0149)   PRN Meds: acetaminophen, ondansetron (ZOFRAN) IV   Vital Signs    Vitals:   07/14/19 0153 07/14/19 0300 07/14/19 0457 07/14/19 0458  BP: 103/61  105/64   Pulse:   (!) 58   Resp:      Temp:   98.2 F (36.8 C)   TempSrc:   Oral   SpO2:   96%   Weight:  (!) 139.3 kg  (!) 138.2 kg  Height:  6\' 2"  (1.88 m)      Intake/Output Summary (Last 24 hours) at 07/14/2019 0740 Last data filed at 07/14/2019 0657 Gross per 24 hour  Intake 180.3 ml  Output 2600 ml  Net -2419.7 ml   Last 3 Weights 07/14/2019 07/14/2019 09/18/2018  Weight (lbs) 304 lb 11.2 oz 307 lb 311 lb  Weight (kg) 138.211 kg 139.254 kg 141.069 kg      Telemetry    Sinus bradycardia, high 50s to 60s. VST 3 beat run, PCs - Personally Reviewed  ECG    pending - Personally Reviewed  Physical Exam   GEN: No acute distress.   Neck: No JVD (difficult to assess due to body habitus) Cardiac: RRR, no murmurs, rubs, or gallops.  Respiratory: Clear to auscultation bilaterally. GI: Soft, nontender, non-distended  MS: Trace edema; No deformity. Neuro:  Nonfocal  Psych: Normal affect   Labs    High Sensitivity Troponin:   Recent Labs  Lab 07/13/19 1510 07/13/19 1818  TROPONINIHS 5 6      Chemistry Recent Labs  Lab 07/13/19 1510  NA 139  K 4.2  CL 106  CO2 25  GLUCOSE 123*  BUN 14  CREATININE 1.33*  CALCIUM 8.8*  GFRNONAA >60  GFRAA >60  ANIONGAP 8     Hematology Recent  Labs  Lab 07/13/19 1510  WBC 7.5  RBC 4.93  HGB 13.7  HCT 41.3  MCV 83.8  MCH 27.8  MCHC 33.2  RDW 17.2*  PLT 262    BNP Recent Labs  Lab 07/13/19 1510  BNP 234.6*     DDimer No results for input(s): DDIMER in the last 168 hours.   Radiology    Dg Chest Portable 1 View  Result Date: 07/13/2019 CLINICAL DATA:  Shortness of breath EXAM: PORTABLE CHEST 1 VIEW COMPARISON:  07/10/2018 FINDINGS: Cardiomegaly. Mild, diffuse interstitial pulmonary opacity. The visualized skeletal structures are unremarkable. IMPRESSION: Cardiomegaly with mild, diffuse interstitial pulmonary opacity, likely mild edema. No focal airspace opacity. Electronically Signed   By: Eddie Candle M.D.   On: 07/13/2019 16:10    Cardiac Studies   Echo 01/2018 Study Conclusions  - Left ventricle: The cavity size was normal. There was moderate   focal basal hypertrophy. Systolic function was normal. The   estimated ejection fraction was in the range of 60% to 65%. Wall   motion was normal; there were no regional wall motion   abnormalities. - Mitral  valve: Calcified annulus. There was trivial regurgitation. - Tricuspid valve: There was trivial regurgitation.  Patient Profile     43 y.o. male with a history of AF s/p PVI 04/2018 who presented for palpitations and mild fluid overload.   Assessment & Plan    Atypical aflutter/afib s/p PVI - Presented with palpitations and mild fluid overload found to be in atypical flutter, rates up to 160s. It's possible he's been in afib for the last month. Started on IV dilt - Overnight patient converted to NSR, rates 50-60s - K+ 4.2, Mag 1.9. Will check a TSH - continue Xarelto. Anticoagulation had been discontinued in the past for low cHADSVASC, but was restarted in February. CHADSVASC =2 (HTN, CHF) - Echo 2019 showed EF 60-65%. Will repeat - Previously patient was rate controlled on toprol-XL 25 mg daily as outpatient. Can consider consolidating cardizem vs  restarting Toprol-XL. Md to see  Chronic diastolic HF - Lasix 40 mg daily, and spiro 25 mg daily home dose - received IV lasix 40 mg in the ED - creatinine is 1.33 on admission. AM labs pending. Baseline creatinine ?1.20 - weight down 307 > 304 lbs - I/Os -2.4L - Not sure of patients dry weight. He feels some distention in his abdomen and has some trace edema on exam. - Would give another dose IV lasix 40 mg   HTN - lisinopril 20 mg and amlodipine 10 mg daily - pressures soft  For questions or updates, please contact [place] Please consult [email or web address] for contact info under     Signed, Cadence Ninfa Meeker, PA-C  07/14/2019, 7:40 AM     The patient was seen, examined and discussed with Cadence Ninfa Meeker, PA-C   and I agree with the above.   He cardioverted to SR, still on cardizem 12.5 mg iv/hr, I will switch to cardizem SR 120 mg Q12H. He is still fluid overloaded, today 304 lbs, baseline 260 lbs, I will continue 40 mg iv lasix Q12H and plan for a discharge tomorrow. Negative fluid balance 2.4 L overnight, Crea slightly up 1.2->1.3. We will follow.  Ena Dawley, MD 07/14/2019

## 2019-07-14 NOTE — Progress Notes (Signed)
  Echocardiogram 2D Echocardiogram has been performed.  Samuel Flynn 07/14/2019, 2:48 PM

## 2019-07-15 ENCOUNTER — Other Ambulatory Visit (HOSPITAL_COMMUNITY): Payer: Self-pay | Admitting: Cardiology

## 2019-07-15 DIAGNOSIS — R0602 Shortness of breath: Secondary | ICD-10-CM

## 2019-07-15 DIAGNOSIS — I5033 Acute on chronic diastolic (congestive) heart failure: Secondary | ICD-10-CM

## 2019-07-15 DIAGNOSIS — R0989 Other specified symptoms and signs involving the circulatory and respiratory systems: Secondary | ICD-10-CM

## 2019-07-15 DIAGNOSIS — I10 Essential (primary) hypertension: Secondary | ICD-10-CM

## 2019-07-15 LAB — BASIC METABOLIC PANEL
Anion gap: 11 (ref 5–15)
BUN: 20 mg/dL (ref 6–20)
CO2: 26 mmol/L (ref 22–32)
Calcium: 8.6 mg/dL — ABNORMAL LOW (ref 8.9–10.3)
Chloride: 104 mmol/L (ref 98–111)
Creatinine, Ser: 1.39 mg/dL — ABNORMAL HIGH (ref 0.61–1.24)
GFR calc Af Amer: >60 mL/min (ref >60)
GFR calc non Af Amer: >60 mL/min (ref >60)
Glucose, Bld: 125 mg/dL — ABNORMAL HIGH (ref 70–99)
Potassium: 3.7 mmol/L (ref 3.5–5.1)
Sodium: 141 mmol/L (ref 135–145)

## 2019-07-15 MED ORDER — METOPROLOL TARTRATE 25 MG PO TABS: 25.0000 mg | ORAL_TABLET | Freq: Two times a day (BID) | ORAL | 6 refills | Status: DC

## 2019-07-15 MED ORDER — FUROSEMIDE 40 MG PO TABS
40.0000 mg | ORAL_TABLET | Freq: Two times a day (BID) | ORAL | Status: DC
  Administered 2019-07-15: 40 mg via ORAL
  Filled 2019-07-15: qty 1

## 2019-07-15 MED ORDER — FUROSEMIDE 40 MG PO TABS: 40.0000 mg | ORAL_TABLET | Freq: Two times a day (BID) | ORAL | 6 refills | Status: DC

## 2019-07-15 MED ORDER — METOPROLOL TARTRATE 25 MG PO TABS
25.0000 mg | ORAL_TABLET | Freq: Two times a day (BID) | ORAL | Status: DC
  Administered 2019-07-15: 25 mg via ORAL
  Filled 2019-07-15: qty 1

## 2019-07-15 NOTE — Progress Notes (Signed)
Progress Note  Patient Name: Samuel Flynn Date of Encounter: 07/15/2019  Primary Cardiologist: Skeet Latch, MD   Subjective   Patient has been diuresing well. Denies chest pain or shortness of breath. Has some LUQ abdominal pain. HR dropping into the upper 40s overnight.   Inpatient Medications    Scheduled Meds: . allopurinol  100 mg Oral Daily  . amLODipine  10 mg Oral Daily  . buPROPion  150 mg Oral Daily  . diltiazem  120 mg Oral Q12H  . furosemide  40 mg Intravenous BID  . influenza vac split quadrivalent PF  0.5 mL Intramuscular Tomorrow-1000  . lisinopril  20 mg Oral Daily  . pantoprazole  40 mg Oral Daily  . rivaroxaban  20 mg Oral Q supper   Continuous Infusions:  PRN Meds: acetaminophen, ondansetron (ZOFRAN) IV   Vital Signs    Vitals:   07/14/19 1336 07/14/19 1929 07/15/19 0648 07/15/19 0648  BP: (!) 101/57 113/71  (!) 100/59  Pulse: 61 60  (!) 56  Resp: 20     Temp: 98.7 F (37.1 C) 98.4 F (36.9 C)  (!) 97.5 F (36.4 C)  TempSrc: Oral Oral  Oral  SpO2: 96% 97%  96%  Weight:   (!) 136.4 kg   Height:        Intake/Output Summary (Last 24 hours) at 07/15/2019 0727 Last data filed at 07/15/2019 0711 Gross per 24 hour  Intake 240 ml  Output 1675 ml  Net -1435 ml   Last 3 Weights 07/15/2019 07/14/2019 07/14/2019  Weight (lbs) 300 lb 12.8 oz 304 lb 11.2 oz 307 lb  Weight (kg) 136.442 kg 138.211 kg 139.254 kg      Telemetry    Sinus bradycardia, HR in the 50s, high 40s overnight - Personally Reviewed  ECG    No new - Personally Reviewed  Physical Exam   GEN: No acute distress.   Neck: No JVD Cardiac: RRR, no murmurs, rubs, or gallops.  Respiratory: Clear to auscultation bilaterally. GI: Soft, nontender, non-distended  MS: No edema; No deformity. Neuro:  Nonfocal  Psych: Normal affect   Labs    High Sensitivity Troponin:   Recent Labs  Lab 07/13/19 1510 07/13/19 1818  TROPONINIHS 5 6      Chemistry Recent Labs   Lab 07/13/19 1510 07/14/19 0910  NA 139 140  K 4.2 4.0  CL 106 104  CO2 25 25  GLUCOSE 123* 125*  BUN 14 19  CREATININE 1.33* 1.37*  CALCIUM 8.8* 8.5*  GFRNONAA >60 >60  GFRAA >60 >60  ANIONGAP 8 11     Hematology Recent Labs  Lab 07/13/19 1510  WBC 7.5  RBC 4.93  HGB 13.7  HCT 41.3  MCV 83.8  MCH 27.8  MCHC 33.2  RDW 17.2*  PLT 262    BNP Recent Labs  Lab 07/13/19 1510  BNP 234.6*     DDimer No results for input(s): DDIMER in the last 168 hours.   Radiology    Dg Chest Portable 1 View  Result Date: 07/13/2019 CLINICAL DATA:  Shortness of breath EXAM: PORTABLE CHEST 1 VIEW COMPARISON:  07/10/2018 FINDINGS: Cardiomegaly. Mild, diffuse interstitial pulmonary opacity. The visualized skeletal structures are unremarkable. IMPRESSION: Cardiomegaly with mild, diffuse interstitial pulmonary opacity, likely mild edema. No focal airspace opacity. Electronically Signed   By: Eddie Candle M.D.   On: 07/13/2019 16:10    Cardiac Studies   Echo 07/14/19  1. Left ventricular ejection fraction, by visual estimation,  is 55 to 60%. The left ventricle has normal function. There is mildly increased left ventricular hypertrophy. Appears normal systolic function, though anterior wall is not well visualized  2. Left ventricular diastolic parameters are indeterminate.  3. Global right ventricle has normal systolic function.The right ventricular size is normal. No increase in right ventricular wall thickness.  4. Left atrial size was mildly dilated.  5. Right atrial size was normal.  6. The mitral valve is normal in structure. Mild mitral valve regurgitation.  7. The tricuspid valve is normal in structure. Tricuspid valve regurgitation is not demonstrated.  8. The aortic valve is tricuspid. Aortic valve regurgitation is not visualized. No evidence of aortic valve sclerosis or stenosis.  9. The pulmonic valve was not well visualized. Pulmonic valve regurgitation is not  visualized. 10. The inferior vena cava is dilated in size with >50% respiratory variability, suggesting right atrial pressure of 8 mmHg. 11. TR signal is inadequate for assessing pulmonary artery systolic pressure.  Patient Profile     43 y.o. male with a history of AF s/p PVI 04/2018 who presented for palpitations and mild fluid overload.   Assessment & Plan    Atypical aflutter/afib s/p PVI - Presented with palpitations and mild fluid overload found to be in atypical flutter, rates up to 160s. It's possible he's been in afib for the last month. Started on IV dilt>> now PO - Patient maintaining NSR, rates 50-60s - K+ 4.2, Mag 1.9.  TSH - continue Xarelto. CHADSVASC =2 (HTN, CHF) - Echo this admission with EF 55-60%, LA mildly dilated - Denies chest pain or palpitations - Continue cardizem >> would consider decrease in dose with HR in the upper 40s overnight.   Chronic diastolic HF - Lasix 40 mg daily, and spiro 25 mg daily home dose - has been diuresing well with IV lasix 40 mg BID - creatinine 1.33 > 1.37.Baseline creatinine ?1.20. AM labs pending - weight down 307 > 300 lbs - I/Os -3.8L - With rise in creatinine will hold lasix  HTN - lisinopril 20 mg and amlodipine 10 mg daily - pressures soft this morning. Hold lisinopril due to worsening kidney function. AM labs pending   For questions or updates, please contact [place] Please consult [email or web address] for contact info under     Signed, Cadence Ninfa Meeker, PA-C  07/15/2019, 7:27 AM     The patient was seen, examined and discussed with Cadence Ninfa Meeker, PA-C   and I agree with the above.   The patient remains in SR, now in sinus bradycardia, I would switch his cardizem to metoprolol 25mg  PO BID as he is on amlodipine as well and his BP is soft. I would switch lasix to 40 mg PO BID and spironolactone 12.5 mg po daily, he appears euvolemic, I would check his BMP and BNP at the next visit that is scheduled for 07/30/2019  with Kerin Ransom and adjust medications then as needed. He will be discharged today. Weight down to 300 lbs, Crea slightly up.  Ena Dawley, MD 07/15/2019

## 2019-07-15 NOTE — Discharge Summary (Signed)
Discharge Summary    Patient ID: Samuel Flynn MRN: [id number]; DOB: [birth year]  Admit date: 07/13/2019 Discharge date: 07/15/2019  Primary Care Provider: Scot Jun, FNP  Primary Cardiologist: Skeet Latch, MD  Primary Electrophysiologist:  Will Meredith Leeds, MD   Discharge Diagnoses    Principal Problem:   Atrial flutter [facility]) Active Problems:   Atrial fibrillation with RVR ([place])   S/P ablation of atrial fibrillation 04/26/18   OSA (obstructive sleep apnea)   Acute on chronic diastolic heart failure ([place])   Diagnostic Studies/Procedures    Echo   1. Left ventricular ejection fraction, by visual estimation, is 55 to 60%. The left ventricle has normal function. There is mildly increased left ventricular hypertrophy. Appears normal systolic function, though anterior wall is not well visualized  2. Left ventricular diastolic parameters are indeterminate.  3. Global right ventricle has normal systolic function.The right ventricular size is normal. No increase in right ventricular wall thickness.  4. Left atrial size was mildly dilated.  5. Right atrial size was normal.  6. The mitral valve is normal in structure. Mild mitral valve regurgitation.  7. The tricuspid valve is normal in structure. Tricuspid valve regurgitation is not demonstrated.  8. The aortic valve is tricuspid. Aortic valve regurgitation is not visualized. No evidence of aortic valve sclerosis or stenosis.  9. The pulmonic valve was not well visualized. Pulmonic valve regurgitation is not visualized. 10. The inferior vena cava is dilated in size with >50% respiratory variability, suggesting right atrial pressure of 8 mmHg. 11. TR signal is inadequate for assessing pulmonary artery systolic pressure.  _____________   History of Present Illness     Samuel Flynn is a 43 y.o. male with a history of Afib s/p PVI in 04/2018, HTN, sleep apnea on CPAP who presented to the ED for palpitations  07/13/19. He felt he had been having palpitations for the last month. Also reported worsening dyspnea and left sided chest discomfort. He was also having some orthopnea despite being on lasix. Xarelto had previously been stopped due to low CHADSVASC but the patient restarted in back in February because he felt intermittent palpitations.   Hospital Course     Consultants: None  In the ED rates were up to 160s and patient was noted to have lower leg edema on exam. RR was 30 ad O2 was 98%. EKG showed aflutter, rates in the 160s with no ischemic changes. Labs showed glucose 123, creatinine 1.33 (baseline 1.2). BNP was 234. Hs troponin 5 > 6. CXR showed cardiomegaly with mild diffuse interstitial pulmonary opacity, likely mild edema. He was given IV lasix 40 mg and started on IV cardizem. Potassium ws 4.0, Magnesium 1.9 and TSH 1.95. Breathing and lower leg swelling improved on the Lasix. The patient converted to NSR overnight, rates in the upper 50s and 60s. Breathing continued to improve with IV lasix 40 mg BID. Cardizem was consolidated to 120 mg BID. Lower leg edema improved as well. Echo showed EF 55-60% and mildly dilated LA, and mild LVH. Blood pressures were soft. HR were noted to be in the upper 40s at night and cardizem was switched to metoprolol 25 mg PO BID. Due to rise in creatinine lisinopril was held. Total weight loss during admission was 7 lbs. He put out -3.6 Liters. Creatinine only slightly up 1.37 to 1.39 on day of discharge. Patient will need recheck BMP and BNP at follow up. Plan to discharge patient on metorpolol 25 mg BID, lasix 40 mg  BID, amlodipine 10 mg daily. Recommended patient take daily weights, decreased salt intake, and monitor intake of fluids.   Patient was seen and examined by Dr. Meda Coffee 07/15/19 and felt to be stable for discharge. Follow up was arranged.   Did the patient have an acute coronary syndrome (MI, NSTEMI, STEMI, etc) this admission?:  No                                Did the patient have a percutaneous coronary intervention (stent / angioplasty)?:  No.   _____________  Discharge Vitals Blood pressure 133/70, pulse (!) 56, temperature (!) 97.5 F (36.4 C), temperature source Oral, resp. rate 20, height 6\' 2"  (1.88 m), weight (!) 136.4 kg, SpO2 98 %.  Filed Weights   07/14/19 0300 07/14/19 0458 07/15/19 0648  Weight: (!) 139.3 kg (!) 138.2 kg (!) 136.4 kg    Labs & Radiologic Studies    CBC Recent Labs    07/13/19 1510  WBC 7.5  HGB 13.7  HCT 41.3  MCV 83.8  PLT 99991111   Basic Metabolic Panel Recent Labs    07/13/19 1510 07/14/19 0910 07/15/19 0757  NA 139 140 141  K 4.2 4.0 3.7  CL 106 104 104  CO2 25 25 26   GLUCOSE 123* 125* 125*  BUN 14 19 20   CREATININE 1.33* 1.37* 1.39*  CALCIUM 8.8* 8.5* 8.6*  MG 1.9  --   --    Liver Function Tests No results for input(s): AST, ALT, ALKPHOS, BILITOT, PROT, ALBUMIN in the last 72 hours. No results for input(s): LIPASE, AMYLASE in the last 72 hours. High Sensitivity Troponin:   Recent Labs  Lab 07/13/19 1510 07/13/19 1818  TROPONINIHS 5 6    BNP Invalid input(s): POCBNP D-Dimer No results for input(s): DDIMER in the last 72 hours. Hemoglobin A1C No results for input(s): HGBA1C in the last 72 hours. Fasting Lipid Panel No results for input(s): CHOL, HDL, LDLCALC, TRIG, CHOLHDL, LDLDIRECT in the last 72 hours. Thyroid Function Tests Recent Labs    07/14/19 0910  TSH 1.950   _____________  Dg Chest Portable 1 View  Result Date: 07/13/2019 CLINICAL DATA:  Shortness of breath EXAM: PORTABLE CHEST 1 VIEW COMPARISON:  07/10/2018 FINDINGS: Cardiomegaly. Mild, diffuse interstitial pulmonary opacity. The visualized skeletal structures are unremarkable. IMPRESSION: Cardiomegaly with mild, diffuse interstitial pulmonary opacity, likely mild edema. No focal airspace opacity. Electronically Signed   By: Eddie Candle M.D.   On: 07/13/2019 16:10   Disposition   Pt is being discharged home  today in good condition.  Follow-up Plans & Appointments    Follow-up Information    Erlene Quan, PA-C Follow up on 07/30/2019.   Specialties: Cardiology, Radiology Why: Please go to follow up November 25th at 2:00 PM Contact information: [address] [place] [address] [phone]            Discharge Medications   Allergies as of 07/15/2019   No Known Allergies     Medication List    STOP taking these medications   lisinopril 20 MG tablet Commonly known as: ZESTRIL   metoprolol succinate 25 MG 24 hr tablet Commonly known as: TOPROL-XL     TAKE these medications   allopurinol 100 MG tablet Commonly known as: ZYLOPRIM Take 100 mg by mouth daily. For gout   amLODipine 10 MG tablet Commonly known as: NORVASC Take 10 mg by mouth daily.  buPROPion 150 MG 24 hr tablet Commonly known as: WELLBUTRIN XL TAKE 1 TABLET BY MOUTH EVERY DAY   colchicine 0.6 MG tablet Take 0.6 mg by mouth daily as needed (gout flare).   FISH OIL PO Take 1 tablet by mouth daily.   furosemide 40 MG tablet Commonly known as: LASIX Take 1 tablet (40 mg total) by mouth 2 (two) times daily. What changed:   medication strength  when to take this   hydrOXYzine 25 MG tablet Commonly known as: ATARAX/VISTARIL Take 0.5-1 tablets (12.5-25 mg total) by mouth every 8 (eight) hours as needed for itching.   ibuprofen 200 MG tablet Commonly known as: ADVIL Take 800 mg by mouth every 6 (six) hours as needed for headache or moderate pain.   metoprolol tartrate 25 MG tablet Commonly known as: LOPRESSOR Take 1 tablet (25 mg total) by mouth 2 (two) times daily.   pantoprazole 40 MG tablet Commonly known as: PROTONIX TAKE 1 TABLET (40 MG TOTAL) BY MOUTH DAILY. **PT NEEDS OFFICE VISIT. What changed: See the new instructions.   spironolactone 25 MG tablet Commonly known as: ALDACTONE Take 0.5 tablets (12.5 mg total) by mouth daily. OFFICE VISIT NEEDED   Xarelto 20  MG Tabs tablet Generic drug: rivaroxaban Take 20 mg by mouth daily.          Outstanding Labs/Studies   None  Duration of Discharge Encounter   Greater than 30 minutes including physician time.  Signed, Namiah Dunnavant Ninfa Meeker, PA-C 07/15/2019, 10:14 AM

## 2019-07-15 NOTE — Progress Notes (Signed)
Pt already placed on CPAP with these settings

## 2019-07-15 NOTE — Discharge Instructions (Signed)
Atrial Fibrillation ° °Atrial fibrillation is a type of heartbeat that is irregular or fast (rapid). If you have this condition, your heart beats without any order. This makes it hard for your heart to pump blood in a normal way. Having this condition gives you more risk for stroke, heart failure, and other heart problems. °Atrial fibrillation may start all of a sudden and then stop on its own, or it may become a long-lasting problem. °What are the causes? °This condition may be caused by heart conditions, such as: °· High blood pressure. °· Heart failure. °· Heart valve disease. °· Heart surgery. °Other causes include: °· Pneumonia. °· Obstructive sleep apnea. °· Lung cancer. °· Thyroid disease. °· Drinking too much alcohol. °Sometimes the cause is not known. °What increases the risk? °You are more likely to develop this condition if: °· You smoke. °· You are older. °· You have diabetes. °· You are overweight. °· You have a family history of this condition. °· You exercise often and hard. °What are the signs or symptoms? °Common symptoms of this condition include: °· A feeling like your heart is beating very fast. °· Chest pain. °· Feeling short of breath. °· Feeling light-headed or weak. °· Getting tired easily. °Follow these instructions at home: °Medicines °· Take over-the-counter and prescription medicines only as told by your doctor. °· If your doctor gives you a blood-thinning medicine, take it exactly as told. Taking too much of it can cause bleeding. Taking too little of it does not protect you against clots. Clots can cause a stroke. °Lifestyle ° °  ° °· Do not use any tobacco products. These include cigarettes, chewing tobacco, and e-cigarettes. If you need help quitting, ask your doctor. °· Do not drink alcohol. °· Do not drink beverages that have caffeine. These include coffee, soda, and tea. °· Follow diet instructions as told by your doctor. °· Exercise regularly as told by your doctor. °General  instructions °· If you have a condition that causes breathing to stop for a short period of time (apnea), treat it as told by your doctor. °· Keep a healthy weight. Do not use diet pills unless your doctor says they are safe for you. Diet pills may make heart problems worse. °· Keep all follow-up visits as told by your doctor. This is important. °Contact a doctor if: °· You notice a change in the speed, rhythm, or strength of your heartbeat. °· You are taking a blood-thinning medicine and you see more bruising. °· You get tired more easily when you move or exercise. °· You have a sudden change in weight. °Get help right away if: ° °· You have pain in your chest or your belly (abdomen). °· You have trouble breathing. °· You have blood in your vomit, poop, or pee (urine). °· You have any signs of a stroke. "BE FAST" is an easy way to remember the main warning signs: °? B - Balance. Signs are dizziness, sudden trouble walking, or loss of balance. °? E - Eyes. Signs are trouble seeing or a change in how you see. °? F - Face. Signs are sudden weakness or loss of feeling in the face, or the face or eyelid drooping on one side. °? A - Arms. Signs are weakness or loss of feeling in an arm. This happens suddenly and usually on one side of the body. °? S - Speech. Signs are sudden trouble speaking, slurred speech, or trouble understanding what people say. °? T - Time.   Time to call emergency services. Write down what time symptoms started.  You have other signs of a stroke, such as: ? A sudden, very bad headache with no known cause. ? Feeling sick to your stomach (nausea). ? Throwing up (vomiting). ? Jerky movements you cannot control (seizure). These symptoms may be an emergency. Do not wait to see if the symptoms will go away. Get medical help right away. Call your local emergency services (911 in the [place] Do not drive yourself to the hospital. Summary  Atrial fibrillation is a type of heartbeat that is irregular  or fast (rapid).  You are at higher risk of this condition if you smoke, are older, have diabetes, or are overweight.  Follow your doctor's instructions about medicines, diet, exercise, and follow-up visits.  Get help right away if you think that you have signs of a stroke. This information is not intended to replace advice given to you by your health care provider. Make sure you discuss any questions you have with your health care provider. Document Released: 05/30/2008 Document Revised: 10/25/2017 Document Reviewed: 10/12/2017 Elsevier Patient Education  2020 [employer] Heart-healthy meal planning includes:  Eating less unhealthy fats.  Eating more healthy fats.  Making other changes in your diet. Talk with your doctor or a diet specialist (dietitian) to create an eating plan that is right for you. What is my plan? Your doctor may recommend an eating plan that includes:  Total fat: ______% or less of total calories a day.  Saturated fat: ______% or less of total calories a day.  Cholesterol: less than _________mg a day. What are tips for following this plan? Cooking Avoid frying your food. Try to bake, boil, grill, or broil it instead. You can also reduce fat by:  Removing the skin from poultry.  Removing all visible fats from meats.  Steaming vegetables in water or broth. Meal planning   At meals, divide your plate into four equal parts: ? Fill one-half of your plate with vegetables and green salads. ? Fill one-fourth of your plate with whole grains. ? Fill one-fourth of your plate with lean protein foods.  Eat 4-5 servings of vegetables per day. A serving of vegetables is: ? 1 cup of raw or cooked vegetables. ? 2 cups of raw leafy greens.  Eat 4-5 servings of fruit per day. A serving of fruit is: ? 1 medium whole fruit. ?  cup of dried fruit. ?  cup of fresh, frozen, or canned fruit. ?  cup of 100% fruit juice.  Eat more  foods that have soluble fiber. These are apples, broccoli, carrots, beans, peas, and barley. Try to get 20-30 g of fiber per day.  Eat 4-5 servings of nuts, legumes, and seeds per week: ? 1 serving of dried beans or legumes equals  cup after being cooked. ? 1 serving of nuts is  cup. ? 1 serving of seeds equals 1 tablespoon. General information  Eat more home-cooked food. Eat less restaurant, buffet, and fast food.  Limit or avoid alcohol.  Limit foods that are high in starch and sugar.  Avoid fried foods.  Lose weight if you are overweight.  Keep track of how much salt (sodium) you eat. This is important if you have high blood pressure. Ask your doctor to tell you more about this.  Try to add vegetarian meals each week. Fats  Choose healthy fats. These include olive oil and canola oil, flaxseeds, walnuts, almonds, and seeds.  Eat more omega-3 fats. These include salmon, mackerel, sardines, tuna, flaxseed oil, and ground flaxseeds. Try to eat fish at least 2 times each week.  Check food labels. Avoid foods with trans fats or high amounts of saturated fat.  Limit saturated fats. ? These are often found in animal products, such as meats, butter, and cream. ? These are also found in plant foods, such as palm oil, palm kernel oil, and coconut oil.  Avoid foods with partially hydrogenated oils in them. These have trans fats. Examples are stick margarine, some tub margarines, cookies, crackers, and other baked goods. What foods can I eat? Fruits All fresh, canned (in natural juice), or frozen fruits. Vegetables Fresh or frozen vegetables (raw, steamed, roasted, or grilled). Green salads. Grains Most grains. Choose whole wheat and whole grains most of the time. Rice and pasta, including brown rice and pastas made with whole wheat. Meats and other proteins Lean, well-trimmed beef, veal, pork, and lamb. Chicken and [place] without skin. All fish and shellfish. Wild duck, rabbit,  pheasant, and venison. Egg whites or low-cholesterol egg substitutes. Dried beans, peas, lentils, and tofu. Seeds and most nuts. Dairy Low-fat or nonfat cheeses, including ricotta and mozzarella. Skim or 1% milk that is liquid, powdered, or evaporated. Buttermilk that is made with low-fat milk. Nonfat or low-fat yogurt. Fats and oils Non-hydrogenated (trans-free) margarines. Vegetable oils, including soybean, sesame, sunflower, olive, peanut, safflower, corn, canola, and cottonseed. Salad dressings or mayonnaise made with a vegetable oil. Beverages Mineral water. Coffee and tea. Diet carbonated beverages. Sweets and desserts Sherbet, gelatin, and fruit ice. Small amounts of dark chocolate. Limit all sweets and desserts. Seasonings and condiments All seasonings and condiments. The items listed above may not be a complete list of foods and drinks you can eat. Contact a dietitian for more options. What foods should I avoid? Fruits Canned fruit in heavy syrup. Fruit in cream or butter sauce. Fried fruit. Limit coconut. Vegetables Vegetables cooked in cheese, cream, or butter sauce. Fried vegetables. Grains Breads that are made with saturated or trans fats, oils, or whole milk. Croissants. Sweet rolls. Donuts. High-fat crackers, such as cheese crackers. Meats and other proteins Fatty meats, such as hot dogs, ribs, sausage, bacon, rib-eye roast or steak. High-fat deli meats, such as salami and bologna. Caviar. Domestic duck and goose. Organ meats, such as liver. Dairy Cream, sour cream, cream cheese, and creamed cottage cheese. Whole-milk cheeses. Whole or 2% milk that is liquid, evaporated, or condensed. Whole buttermilk. Cream sauce or high-fat cheese sauce. Yogurt that is made from whole milk. Fats and oils Meat fat, or shortening. Cocoa butter, hydrogenated oils, palm oil, coconut oil, palm kernel oil. Solid fats and shortenings, including bacon fat, salt pork, lard, and butter. Nondairy cream  substitutes. Salad dressings with cheese or sour cream. Beverages Regular sodas and juice drinks with added sugar. Sweets and desserts Frosting. Pudding. Cookies. Cakes. Pies. Milk chocolate or white chocolate. Buttered syrups. Full-fat ice cream or ice cream drinks. The items listed above may not be a complete list of foods and drinks to avoid. Contact a dietitian for more information. Summary  Heart-healthy meal planning includes eating less unhealthy fats, eating more healthy fats, and making other changes in your diet.  Eat a balanced diet. This includes fruits and vegetables, low-fat or nonfat dairy, lean protein, nuts and legumes, whole grains, and heart-healthy oils and fats. This information is not intended to replace advice given to you by your health care provider. Make sure you  discuss any questions you have with your health care provider. Document Released: 02/20/2012 Document Revised: 10/25/2017 Document Reviewed: 09/28/2017 Elsevier Patient Education  2020 [employer].

## 2019-07-15 NOTE — Plan of Care (Signed)
  Problem: Elimination: Goal: Will not experience complications related to urinary retention Outcome: Progressing Note: Voiding without difficulty; adequate urine output.   Problem: Pain Managment: Goal: General experience of comfort will improve Outcome: Progressing Note: Pain controlled with po pain meds.

## 2019-07-30 ENCOUNTER — Ambulatory Visit: Payer: PRIVATE HEALTH INSURANCE | Admitting: Cardiology

## 2019-08-08 ENCOUNTER — Telehealth: Payer: Self-pay

## 2019-08-08 NOTE — Telephone Encounter (Signed)
Called patient to do their pre-visit COVID screening.  Call went to voicemail, which is not set up. Unable to do prescreening.

## 2019-08-11 ENCOUNTER — Encounter: Payer: Self-pay | Admitting: Family Medicine

## 2019-08-11 ENCOUNTER — Other Ambulatory Visit: Payer: Self-pay

## 2019-08-11 ENCOUNTER — Ambulatory Visit (INDEPENDENT_AMBULATORY_CARE_PROVIDER_SITE_OTHER): Payer: PRIVATE HEALTH INSURANCE | Admitting: Family Medicine

## 2019-08-11 VITALS — BP 119/81 | HR 85 | Temp 97.3°F | Resp 17 | Wt 310.8 lb

## 2019-08-11 DIAGNOSIS — Z7901 Long term (current) use of anticoagulants: Secondary | ICD-10-CM

## 2019-08-11 DIAGNOSIS — K219 Gastro-esophageal reflux disease without esophagitis: Secondary | ICD-10-CM

## 2019-08-11 DIAGNOSIS — R350 Frequency of micturition: Secondary | ICD-10-CM | POA: Diagnosis not present

## 2019-08-11 DIAGNOSIS — R7303 Prediabetes: Secondary | ICD-10-CM

## 2019-08-11 DIAGNOSIS — R002 Palpitations: Secondary | ICD-10-CM | POA: Diagnosis not present

## 2019-08-11 DIAGNOSIS — R0602 Shortness of breath: Secondary | ICD-10-CM | POA: Diagnosis not present

## 2019-08-11 DIAGNOSIS — R739 Hyperglycemia, unspecified: Secondary | ICD-10-CM

## 2019-08-11 DIAGNOSIS — M549 Dorsalgia, unspecified: Secondary | ICD-10-CM

## 2019-08-11 DIAGNOSIS — R079 Chest pain, unspecified: Secondary | ICD-10-CM

## 2019-08-11 DIAGNOSIS — R319 Hematuria, unspecified: Secondary | ICD-10-CM

## 2019-08-11 DIAGNOSIS — I480 Paroxysmal atrial fibrillation: Secondary | ICD-10-CM | POA: Diagnosis not present

## 2019-08-11 DIAGNOSIS — R7989 Other specified abnormal findings of blood chemistry: Secondary | ICD-10-CM

## 2019-08-11 LAB — POCT URINALYSIS DIP (CLINITEK)
Bilirubin, UA: NEGATIVE
Glucose, UA: NEGATIVE mg/dL
Ketones, POC UA: NEGATIVE mg/dL
Leukocytes, UA: NEGATIVE
Nitrite, UA: NEGATIVE
Spec Grav, UA: >=1.03 — AB
Urobilinogen, UA: 0.2 U/dL
pH, UA: 5.5

## 2019-08-11 MED ORDER — PANTOPRAZOLE SODIUM 40 MG PO TBEC: 40.0000 mg | DELAYED_RELEASE_TABLET | Freq: Every day | ORAL | 4 refills | Status: DC

## 2019-08-11 MED ORDER — SULFAMETHOXAZOLE-TRIMETHOPRIM 800-160 MG PO TABS: 1.0000 | ORAL_TABLET | Freq: Two times a day (BID) | ORAL | 0 refills | Status: DC

## 2019-08-11 MED ORDER — METOPROLOL SUCCINATE ER 100 MG PO TB24: 100.0000 mg | ORAL_TABLET | Freq: Every day | ORAL | 3 refills | Status: DC

## 2019-08-11 NOTE — Patient Instructions (Addendum)
Please pick up new prescription at your pharmacy for metoprolol extended release to take 100 mg once daily and stop taking the 25 twice daily and 25 extended release.  Please go to the emergency department if you continue to have chest pain, shortness of breath, increased heart rate and not feel well.  Please keep appointment this Friday, November 11 at 9:30 AM with cardiology.  If you need directions to the cardiology office or need to change your appointment, please contact cardiology at [phone].  Please schedule 2 week office follow-up.   Atrial Fibrillation  Atrial fibrillation is a type of heartbeat that is irregular or fast (rapid). If you have this condition, your heart beats without any order. This makes it hard for your heart to pump blood in a normal way. Having this condition gives you more risk for stroke, heart failure, and other heart problems. Atrial fibrillation may start all of a sudden and then stop on its own, or it may become a long-lasting problem. What are the causes? This condition may be caused by heart conditions, such as:  High blood pressure.  Heart failure.  Heart valve disease.  Heart surgery. Other causes include:  Pneumonia.  Obstructive sleep apnea.  Lung cancer.  Thyroid disease.  Drinking too much alcohol. Sometimes the cause is not known. What increases the risk? You are more likely to develop this condition if:  You smoke.  You are older.  You have diabetes.  You are overweight.  You have a family history of this condition.  You exercise often and hard. What are the signs or symptoms? Common symptoms of this condition include:  A feeling like your heart is beating very fast.  Chest pain.  Feeling short of breath.  Feeling light-headed or weak.  Getting tired easily. Follow these instructions at home: Medicines  Take over-the-counter and prescription medicines only as told by your doctor.  If your doctor gives you a  blood-thinning medicine, take it exactly as told. Taking too much of it can cause bleeding. Taking too little of it does not protect you against clots. Clots can cause a stroke. Lifestyle      Do not use any tobacco products. These include cigarettes, chewing tobacco, and e-cigarettes. If you need help quitting, ask your doctor.  Do not drink alcohol.  Do not drink beverages that have caffeine. These include coffee, soda, and tea.  Follow diet instructions as told by your doctor.  Exercise regularly as told by your doctor. General instructions  If you have a condition that causes breathing to stop for a short period of time (apnea), treat it as told by your doctor.  Keep a healthy weight. Do not use diet pills unless your doctor says they are safe for you. Diet pills may make heart problems worse.  Keep all follow-up visits as told by your doctor. This is important. Contact a doctor if:  You notice a change in the speed, rhythm, or strength of your heartbeat.  You are taking a blood-thinning medicine and you see more bruising.  You get tired more easily when you move or exercise.  You have a sudden change in weight. Get help right away if:   You have pain in your chest or your belly (abdomen).  You have trouble breathing.  You have blood in your vomit, poop, or pee (urine).  You have any signs of a stroke. "BE FAST" is an easy way to remember the main warning signs: ? B - Balance.  Signs are dizziness, sudden trouble walking, or loss of balance. ? E - Eyes. Signs are trouble seeing or a change in how you see. ? F - Face. Signs are sudden weakness or loss of feeling in the face, or the face or eyelid drooping on one side. ? A - Arms. Signs are weakness or loss of feeling in an arm. This happens suddenly and usually on one side of the body. ? S - Speech. Signs are sudden trouble speaking, slurred speech, or trouble understanding what people say. ? T - Time. Time to call  emergency services. Write down what time symptoms started.  You have other signs of a stroke, such as: ? A sudden, very bad headache with no known cause. ? Feeling sick to your stomach (nausea). ? Throwing up (vomiting). ? Jerky movements you cannot control (seizure). These symptoms may be an emergency. Do not wait to see if the symptoms will go away. Get medical help right away. Call your local emergency services (911 in the [place] Do not drive yourself to the hospital. Summary  Atrial fibrillation is a type of heartbeat that is irregular or fast (rapid).  You are at higher risk of this condition if you smoke, are older, have diabetes, or are overweight.  Follow your doctor's instructions about medicines, diet, exercise, and follow-up visits.  Get help right away if you think that you have signs of a stroke. This information is not intended to replace advice given to you by your health care provider. Make sure you discuss any questions you have with your health care provider. Document Released: 05/30/2008 Document Revised: 10/25/2017 Document Reviewed: 10/12/2017 Elsevier Patient Education  [place].  Palpitations Palpitations are feelings that your heartbeat is not normal. Your heartbeat may feel like it is:  Uneven.  Faster than normal.  Fluttering.  Skipping a beat. This is usually not a serious problem. In some cases, you may need tests to rule out any serious problems. Follow these instructions at home: Pay attention to any changes in your condition. Take these actions to help manage your symptoms: Eating and drinking  Avoid: ? Coffee, tea, soft drinks, and energy drinks. ? Chocolate. ? Alcohol. ? Diet pills. Lifestyle   Try to lower your stress. These things can help you relax: ? Yoga. ? Deep breathing and meditation. ? Exercise. ? Using words and images to create positive thoughts (guided imagery). ? Using your mind to control things in your body  (biofeedback).  Do not use drugs.  Get plenty of rest and sleep. Keep a regular bed time. General instructions   Take over-the-counter and prescription medicines only as told by your doctor.  Do not use any products that contain nicotine or tobacco, such as cigarettes and e-cigarettes. If you need help quitting, ask your doctor.  Keep all follow-up visits as told by your doctor. This is important. You may need more tests if palpitations do not go away or get worse. Contact a doctor if:  Your symptoms last more than 24 hours.  Your symptoms occur more often. Get help right away if you:  Have chest pain.  Feel short of breath.  Have a very bad headache.  Feel dizzy.  Pass out (faint). Summary  Palpitations are feelings that your heartbeat is uneven or faster than normal. It may feel like your heart is fluttering or skipping a beat.  Avoid food and drinks that may cause palpitations. These include caffeine, chocolate, and alcohol.  Try to lower  your stress. Do not smoke or use drugs.  Get help right away if you faint or have chest pain, shortness of breath, a severe headache, or dizziness. This information is not intended to replace advice given to you by your health care provider. Make sure you discuss any questions you have with your health care provider. Document Released: 05/30/2008 Document Revised: 10/03/2017 Document Reviewed: 10/03/2017 Elsevier Patient Education  2020 [employer].

## 2019-08-11 NOTE — Progress Notes (Signed)
Established Patient Office Visit  Subjective:  Patient ID: Samuel Flynn, male    DOB: 12-17-1975  Age: 43 y.o. MRN: [id number]  CC:  Chief Complaint  Patient presents with  . Hospitalization Follow-up    HPI Samuel Flynn , 43 yo male who last saw Molli Barrows, NP on 09/18/2018, who presents for follow-up status post hospitalization from 07/13/2019 through 07/15/2019 . Per discharge summary, principal problem was listed as atrial flutter, active problems of atrial fibrillation with rapid ventricular rate, status post ablation of atrial fibrillation, obstructive sleep apnea and acute on chronic diastolic heart failure.        He reports he missed his follow-up appointment with cardiology status post hospitalization but has rescheduled the appointment.  He continues to have sensation of palpitations/heart fluttering and sometimes increased heart rate and shortness of breath.  He also has occasional episodes of fleeting sharp chest pain most often in the left chest but he can also have chest pressure across the upper left and right chest.  He does have a history of GERD and has been unable to get refills of his pantoprazole.  He has also on Xarelto due to his atrial fibrillation.  He has had occasional episodes of sweating when he is having chest pain.  He denies any radiation of pain/discomfort or pressure to the throat or jaw area and no radiation to the arms or upper back.  No nausea associated with chest pain episodes.  Chest pain can occur with or without activity.         He reports compliance with all medications as well as compliance nightly with CPAP.  He does feel as if he may have a mild increase in lower extremity edema but he is taking his Lasix daily.  He does complain of increased urinary frequency independent of frequency associated with Lasix use.  He also reports recent onset of right mid back pain.  He also feels as if he is chronically fatigued.  Past Medical History:   Diagnosis Date  . Atrial fibrillation with RVR ([place])   . Gastric ulcer   . Gout   . Hypertension   . Paroxysmal A-fib ([place])   . Sleep apnea    USES CPAP  . Wears glasses     Past Surgical History:  Procedure Laterality Date  . ATRIAL FIBRILLATION ABLATION N/A 04/26/2018   Procedure: ATRIAL FIBRILLATION ABLATION;  Surgeon: Constance Haw, MD;  Location: [facility];  Service: Cardiovascular;  Laterality: N/A;  . CARDIOVERSION N/A 01/18/2018   Procedure: CARDIOVERSION;  Surgeon: Larey Dresser, MD;  Location: [facility] ENDOSCOPY;  Service: Cardiovascular;  Laterality: N/A;  . ELBOW LIGAMENT RECONSTRUCTION Right 09/29/2014   Procedure: Merian Capron FRACTURE FIXATION, POSSIBLE LIGAMENT REPAIR;  Surgeon: Nita Sells, MD;  Location: [facility];  Service: Orthopedics;  Laterality: Right;  Right elbow coranoid fracture fixation, possible ligament repair  . TEE WITHOUT CARDIOVERSION N/A 01/18/2018   Procedure: TRANSESOPHAGEAL ECHOCARDIOGRAM (TEE);  Surgeon: Larey Dresser, MD;  Location: [facility] ENDOSCOPY;  Service: Cardiovascular;  Laterality: N/A;  . WISDOM TOOTH EXTRACTION      Family History  Problem Relation Age of Onset  . Hypertension Paternal Uncle   . Hypertension Maternal Grandmother   . Hypertension Paternal Uncle   . Hypertension Paternal Uncle   . Hypertension Paternal Uncle   . Hypertension Paternal Uncle   . Hypertension Mother   . Stroke Father   . Heart disease Father   .  Heart attack Father   . Hypertension Father   . Colon cancer Neg Hx     Social History   Socioeconomic History  . Marital status: Married    Spouse name: Not on file  . Number of children: 4  . Years of education: 10  . Highest education level: Not on file  Occupational History  . Occupation: Truck Education administrator: [employer]  . Financial resource strain: Not on file  . Food insecurity    Worry: Not on file    Inability: Not on file  .  Transportation needs    Medical: Not on file    Non-medical: Not on file  Tobacco Use  . Smoking status: Former Smoker    Types: Cigars    Quit date: 02/03/2016    Years since quitting: 3.5  . Smokeless tobacco: Never Used  Substance and Sexual Activity  . Alcohol use: Yes    Alcohol/week: 0.0 standard drinks    Comment: occ  . Drug use: No  . Sexual activity: Yes    Comment: 5 a day  Lifestyle  . Physical activity    Days per week: Not on file    Minutes per session: Not on file  . Stress: Not on file  Relationships  . Social Herbalist on phone: Not on file    Gets together: Not on file    Attends religious service: Not on file    Active member of club or organization: Not on file    Attends meetings of clubs or organizations: Not on file    Relationship status: Not on file  . Intimate partner violence    Fear of current or ex partner: Not on file    Emotionally abused: Not on file    Physically abused: Not on file    Forced sexual activity: Not on file  Other Topics Concern  . Not on file  Social History Narrative  . Not on file    Outpatient Medications Prior to Visit  Medication Sig Dispense Refill  . allopurinol (ZYLOPRIM) 100 MG tablet Take 100 mg by mouth daily. For gout    . amLODipine (NORVASC) 10 MG tablet Take 10 mg by mouth daily.    Marland Kitchen buPROPion (WELLBUTRIN XL) 150 MG 24 hr tablet TAKE 1 TABLET BY MOUTH EVERY DAY (Patient taking differently: Take 150 mg by mouth daily. ) 30 tablet 0  . colchicine 0.6 MG tablet Take 0.6 mg by mouth daily as needed (gout flare).   1  . furosemide (LASIX) 40 MG tablet Take 1 tablet (40 mg total) by mouth 2 (two) times daily. 60 tablet 6  . metoprolol tartrate (LOPRESSOR) 25 MG tablet Take 1 tablet (25 mg total) by mouth 2 (two) times daily. 60 tablet 6  . Omega-3 Fatty Acids (FISH OIL PO) Take 1 tablet by mouth daily.    . pantoprazole (PROTONIX) 40 MG tablet TAKE 1 TABLET (40 MG TOTAL) BY MOUTH DAILY. **PT NEEDS  OFFICE VISIT. (Patient taking differently: Take 40 mg by mouth daily. ) 30 tablet 2  . XARELTO 20 MG TABS tablet Take 20 mg by mouth daily.    . hydrOXYzine (ATARAX/VISTARIL) 25 MG tablet Take 0.5-1 tablets (12.5-25 mg total) by mouth every 8 (eight) hours as needed for itching. (Patient not taking: Reported on 07/13/2019) 30 tablet 1  . ibuprofen (ADVIL,MOTRIN) 200 MG tablet Take 800 mg by mouth every 6 (six) hours as  needed for headache or moderate pain.    Marland Kitchen spironolactone (ALDACTONE) 25 MG tablet Take 0.5 tablets (12.5 mg total) by mouth daily. OFFICE VISIT NEEDED (Patient not taking: Reported on 07/13/2019) 7 tablet 0   No facility-administered medications prior to visit.     No Known Allergies  ROS Review of Systems  Constitutional: Positive for diaphoresis (Occasional) and fatigue. Negative for chills and fever.  HENT: Negative for nosebleeds, sore throat and trouble swallowing.   Eyes: Negative for photophobia and visual disturbance (Wears glasses).  Respiratory: Positive for shortness of breath. Negative for cough.   Cardiovascular: Positive for chest pain, palpitations and leg swelling.  Gastrointestinal: Negative for abdominal pain.  Endocrine: Positive for polyuria. Negative for cold intolerance, heat intolerance, polydipsia and polyphagia.  Genitourinary: Positive for flank pain and frequency. Negative for dysuria and hematuria.  Neurological: Negative for dizziness and headaches.  Hematological: Negative for adenopathy. Does not bruise/bleed easily.      Objective:    Physical Exam  Constitutional: He is oriented to person, place, and time. He appears well-developed and well-nourished.  Well-nourished well-developed obese male who appears slightly older than stated age and patient appears to be fatigued  Neck: Normal range of motion. Neck supple. No JVD present. No thyromegaly present.  Cardiovascular:  Patient with mild tachycardia which increased during auscultation and  patient with irregularly irregular heart rhythm with runs of heart rhythm similar to bigeminy  Pulmonary/Chest: Effort normal and breath sounds normal.  Abdominal: Soft. There is no abdominal tenderness. There is no rebound and no guarding.  Truncal obesity, abdomen is nontender  Musculoskeletal:        General: Tenderness and edema (Bilateral lower extremity edema, no calf tenderness, trace pitting) present.     Comments: Right CVA tenderness  Lymphadenopathy:    He has no cervical adenopathy.  Neurological: He is alert and oriented to person, place, and time.  Skin: Skin is warm and dry.  Psychiatric: He has a normal mood and affect. His behavior is normal.  Slightly flattened affect versus fatigue  Nursing note and vitals reviewed.   BP 119/81   Pulse 85   Temp (!) 97.3 F (36.3 C) (Temporal)   Resp 17   Wt (!) 310 lb 12.8 oz (141 kg)   SpO2 96%   BMI 39.90 kg/m  Wt Readings from Last 3 Encounters:  08/11/19 (!) 310 lb 12.8 oz (141 kg)  07/15/19 (!) 300 lb 12.8 oz (136.4 kg)  09/18/18 (!) 311 lb (141.1 kg)     Lab Results  Component Value Date   TSH 1.950 07/14/2019   Lab Results  Component Value Date   WBC 7.5 07/13/2019   HGB 13.7 07/13/2019   HCT 41.3 07/13/2019   MCV 83.8 07/13/2019   PLT 262 07/13/2019   Lab Results  Component Value Date   NA 141 07/15/2019   K 3.7 07/15/2019   CO2 26 07/15/2019   GLUCOSE 125 (H) 07/15/2019   BUN 20 07/15/2019   CREATININE 1.39 (H) 07/15/2019   BILITOT 1.5 (H) 01/18/2017   ALKPHOS 55 01/18/2017   AST 17 01/18/2017   ALT 16 (L) 01/18/2017   PROT 7.2 01/18/2017   ALBUMIN 4.1 01/18/2017   CALCIUM 8.6 (L) 07/15/2019   ANIONGAP 11 07/15/2019   Lab Results  Component Value Date   CHOL 211 (H) 01/16/2018   Lab Results  Component Value Date   HDL 24 (L) 01/16/2018   Lab Results  Component Value Date  LDLCALC 122 (H) 01/16/2018   Lab Results  Component Value Date   TRIG 323 (H) 01/16/2018   Lab Results   Component Value Date   CHOLHDL 8.8 01/16/2018   Lab Results  Component Value Date   HGBA1C 5.7 (H) 03/24/2018      Assessment & Plan:  1. Paroxysmal atrial fibrillation (HCC);2.  Palpitations; 3.  Chest pain; 4.  Shortness of breath Patient with complaint of continued sensation of palpitations/tachycardia/increased heart rate and has past diagnosis of paroxysmal atrial fibrillation-atrial fibrillation with rapid ventricular rate.  Patient additionally has issues with chest pain and shortness of breath.  Chest pain is sharp and intermittent and can occur with or without palpitations.  Shortness of breath is usually associated with a sensation of increased heart rate.  Patient will have EKG done at today's visit.  Patient's EKG showed atrial fibrillation with heart rate of 135 and per CMA, his heart rate was a little over 150 while being hooked up to have his EKG at today's visit.  Discussed patient with cardiologist on-call at patient's cardiology office and discussed with the patient that the cardiologist recommended that if patient was not feeling well due to his increased heart rate/sensation of palpitations that patient should go to the emergency department for further evaluation and treatment.  Patient did not wish to go to the emergency department as he stated that he would likely be kept as an inpatient or be in the emergency department for very long time.  The other option per cardiology was to increase patient's metoprolol to better control his heart rate and have patient follow-up in the office.  Patient reports that he has actually been taking both a long-acting metoprolol as well as twice daily short acting metoprolol as he states that this was on his hospital discharge summary and on review, patient is correct as both medications had been listed.  Patient also reports that he occasionally takes extra metoprolol when he feels as if his heart rate is high.  Discussed with the patient that he  will be prescribed metoprolol XL 100 mg daily and he is not to take any additional metoprolol on an as-needed basis.  He was also made aware of in person appointment with cardiology this Friday and provided with number to contact cardiology office if he anticipates that he will need to change to the appointment.  He was also made aware that if he has continued symptoms or worsening of symptoms that he should go to the emergency department for further evaluation and treatment.  BMP will be done in follow-up of his palpitations/increased heart rate. - EKG 12-Lead - metoprolol succinate (TOPROL-XL) 100 MG 24 hr tablet; Take 1 tablet (100 mg total) by mouth daily. Take with or immediately following a meal.  Dispense: 90 tablet; Refill: 3 - Basic Metabolic Panel  5. Urinary frequency; 8.  Elevated blood sugar level; 9.  Prediabetes; 10.  Mid back pain and right On exam, patient with right CVA tenderness and he has complaint of urinary frequency.  Will obtain urinalysis to look for urinary tract infection as the cause of his back pain and urinary frequency.  Patient is on blood thinning medicine but denies any blood in the urine and no other unusual bruising or bleeding.  On review of chart, patient also with prior elevated blood sugar levels on blood work as well as prior hemoglobin A1c of 5.7 on 03/24/2018 and hemoglobin A1c will be repeated to see if prediabetes/diabetes may be contributing  to his urinary frequency. - POCT URINALYSIS DIP (CLINITEK) - Hemoglobin A1c - sulfamethoxazole-trimethoprim (BACTRIM DS) 800-160 MG tablet; Take 1 tablet by mouth 2 (two) times daily.  Dispense: 7 tablet; Refill: 0  6. Gastroesophageal reflux disease, unspecified whether esophagitis present Patient reports history of GERD and had been unable to get refills of Protonix from her prior provider.  Prescription provided for Protonix in case patient's atypical chest pain may be related to acid reflux but also will have patient  take Protonix for stomach protection as he is on long-term anticoagulant, Xarelto. - pantoprazole (PROTONIX) 40 MG tablet; Take 1 tablet (40 mg total) by mouth daily.  Dispense: 90 tablet; Refill: 4  7. Elevated serum creatinine Patient with blood work from 07/15/2019 with creatinine of 1.39 and he will have repeat BMP to check creatinine while at today's visit.  Patient is on Lasix which can cause increases in creatinine as well having hypertension which can also elevate creatinine. - Basic Metabolic Panel  11. Long term current use of anticoagulant Will obtain CBC to look for anemia or platelet disorder associated with the patient's use of Xarelto - CBC  12. Hematuria, unspecified type Patient with hematuria on urinalysis.  Urine will be sent for culture to look for infection as the cause of his urinary frequency and right mid back pain.  He will be notified of additional antibiotic therapy or change in antibiotic therapy is needed based on the results.  He will be placed on Bactrim DS in the interim in case of urinary tract infection.  If he is worsening of mid back pain or back pain radiating into the abdomen he may need to go to the emergency department to be evaluated for kidney stones. - Urine Culture - CBC - sulfamethoxazole-trimethoprim (BACTRIM DS) 800-160 MG tablet; Take 1 tablet by mouth 2 (two) times daily.  Dispense: 7 tablet; Refill: 0   An After Visit Summary was printed and given to the patient.  Follow-up: Return in about 2 weeks (around 08/25/2019) for Atrial fibrillation/increased heart rate/CHF-keep upcoming cardiology appointment.    Antony Blackbird, MD

## 2019-08-11 NOTE — Progress Notes (Signed)
Patient here for hospital f/up. Was seen for Afib with flutter, SHOB & CHF. Has follow up with Cardiology 08/26/2019.  Doesn't feel like his medications for the Afib is working. States he feels constant flutters.

## 2019-08-12 ENCOUNTER — Encounter: Payer: Self-pay | Admitting: Family Medicine

## 2019-08-12 LAB — BASIC METABOLIC PANEL WITH GFR
BUN/Creatinine Ratio: 14 (ref 9–20)
BUN: 21 mg/dL (ref 6–24)
CO2: 23 mmol/L (ref 20–29)
Calcium: 8.8 mg/dL (ref 8.7–10.2)
Chloride: 105 mmol/L (ref 96–106)
Creatinine, Ser: 1.51 mg/dL — ABNORMAL HIGH (ref 0.76–1.27)
GFR calc Af Amer: 64 mL/min/1.73
GFR calc non Af Amer: 55 mL/min/1.73 — ABNORMAL LOW
Glucose: 91 mg/dL (ref 65–99)
Potassium: 4.2 mmol/L (ref 3.5–5.2)
Sodium: 143 mmol/L (ref 134–144)

## 2019-08-12 LAB — CBC
Hematocrit: 41.1 % (ref 37.5–51.0)
Hemoglobin: 13.6 g/dL (ref 13.0–17.7)
MCH: 27.8 pg (ref 26.6–33.0)
MCHC: 33.1 g/dL (ref 31.5–35.7)
MCV: 84 fL (ref 79–97)
Platelets: 224 x10E3/uL (ref 150–450)
RBC: 4.89 x10E6/uL (ref 4.14–5.80)
RDW: 18.3 % — ABNORMAL HIGH (ref 11.6–15.4)
WBC: 7.2 x10E3/uL (ref 3.4–10.8)

## 2019-08-12 LAB — HEMOGLOBIN A1C
Est. average glucose Bld gHb Est-mCnc: 120 mg/dL
Hgb A1c MFr Bld: 5.8 % — ABNORMAL HIGH (ref 4.8–5.6)

## 2019-08-13 LAB — URINE CULTURE

## 2019-08-15 ENCOUNTER — Other Ambulatory Visit (HOSPITAL_COMMUNITY)
Admission: RE | Admit: 2019-08-15 | Discharge: 2019-08-15 | Disposition: A | Discharge status: Home / Self Care | Payer: PRIVATE HEALTH INSURANCE | Source: Ambulatory Visit | Attending: Vascular Surgery | Admitting: Vascular Surgery

## 2019-08-15 ENCOUNTER — Ambulatory Visit: Payer: PRIVATE HEALTH INSURANCE | Admitting: Physician Assistant

## 2019-08-15 ENCOUNTER — Other Ambulatory Visit: Payer: Self-pay

## 2019-08-15 ENCOUNTER — Encounter: Payer: Self-pay | Admitting: Physician Assistant

## 2019-08-15 VITALS — BP 126/88 | HR 139 | Temp 99.6°F | Ht 74.0 in | Wt 312.6 lb

## 2019-08-15 DIAGNOSIS — Z20828 Contact with and (suspected) exposure to other viral communicable diseases: Secondary | ICD-10-CM | POA: Insufficient documentation

## 2019-08-15 DIAGNOSIS — Z01812 Encounter for preprocedural laboratory examination: Secondary | ICD-10-CM | POA: Diagnosis present

## 2019-08-15 DIAGNOSIS — Z01810 Encounter for preprocedural cardiovascular examination: Secondary | ICD-10-CM

## 2019-08-15 DIAGNOSIS — I5032 Chronic diastolic (congestive) heart failure: Secondary | ICD-10-CM

## 2019-08-15 DIAGNOSIS — G4733 Obstructive sleep apnea (adult) (pediatric): Secondary | ICD-10-CM

## 2019-08-15 DIAGNOSIS — I10 Essential (primary) hypertension: Secondary | ICD-10-CM

## 2019-08-15 DIAGNOSIS — I484 Atypical atrial flutter: Secondary | ICD-10-CM

## 2019-08-15 DIAGNOSIS — Z9989 Dependence on other enabling machines and devices: Secondary | ICD-10-CM

## 2019-08-15 NOTE — Progress Notes (Signed)
Cardiology Office Note:    Date:  08/17/2019   ID:  DELPHIN MCCOLLIN, DOB [birth year], MRN [id number]  PCP:  Scot Jun, FNP  Cardiologist:  Skeet Latch, MD  Electrophysiologist:  Constance Haw, MD   Referring MD: Scot Jun, FNP   Chief Complaint  Patient presents with  . Atrial Fibrillation  . Chest Pain  . Edema    legs     History of Present Illness:    Samuel Flynn is a 43 y.o. male with a hx of atrial fibrillation s/p ablation in 04/2018, hypertension, obesity, obstructive sleep apnea on CPAP, and CKD.  Patient was initially diagnosed with atrial fibrillation in 2017.  He was last seen by A. fib clinic in November 2019, he has self discontinued his amiodarone and the Xarelto was also discontinued given lack of recurrent atrial fibrillation.  More recently, patient presented back to the hospital in November 2020 with palpitation and mild fluid volume overload.  He was found to be in atypical flutter with heart rate of up to 160 bpm.  Patient was treated with IV diltiazem and restarted on the Xarelto.  Repeat echocardiogram obtained during this admission showed EF 55 to 60%, mild MR.  Overnight, he had heart rate in the upper 40s on the Cardizem, rate control agent was eventually switched to metoprolol 25 mg twice daily.  After diuresis, his discharge weight was 300 pounds.  He was seen by his primary care provider on 08/11/2019, he was noted to be in atrial fibrillation with RVR.  His PCP spoke with on-call cardiologist who recommended him to go to the emergency room however patient refused.  His previous metoprolol tartrate was switched to Toprol-XL 100 mg daily.  Patient presents today for cardiology office visit.  His heart rate is 139 bpm.  He continues to have weakness and shortness of breath along with dizziness.  His current weight is 312 pounds which is 12 pounds heavier than his discharge dry weight.  I instructed him to increase Lasix to 40 mg  twice daily for the next 2 days before going back to the previous dose of 40 mg daily.  I discussed his EKG finding with Dr. Audie Box, DOD, I do not think any additional rate control agent will help him.  He has been absolutely compliant with Xarelto, we will arrange for outpatient DC cardioversion.  Risk and benefit of the procedure has been explained to the patient was agreeable to proceed.  Afterward, he will be referred back to the atrial fibrillation clinic to consider antiarrhythmic therapy.  His possible antiarrhythmic candidate agents include amiodarone, Tikosyn, and sotalol.    Past Medical History:  Diagnosis Date  . Atrial fibrillation with RVR ([place])   . Gastric ulcer   . Gout   . Hypertension   . Paroxysmal A-fib ([place])   . Sleep apnea    USES CPAP  . Wears glasses     Past Surgical History:  Procedure Laterality Date  . ATRIAL FIBRILLATION ABLATION N/A 04/26/2018   Procedure: ATRIAL FIBRILLATION ABLATION;  Surgeon: Constance Haw, MD;  Location: [place] [facility];  Service: Cardiovascular;  Laterality: N/A;  . CARDIOVERSION N/A 01/18/2018   Procedure: CARDIOVERSION;  Surgeon: Larey Dresser, MD;  Location: [facility] ENDOSCOPY;  Service: Cardiovascular;  Laterality: N/A;  . ELBOW LIGAMENT RECONSTRUCTION Right 09/29/2014   Procedure: Merian Capron FRACTURE FIXATION, POSSIBLE LIGAMENT REPAIR;  Surgeon: Nita Sells, MD;  Location: [place];  Service: Orthopedics;  Laterality: Right;  Right elbow coranoid fracture fixation, possible ligament repair  . TEE WITHOUT CARDIOVERSION N/A 01/18/2018   Procedure: TRANSESOPHAGEAL ECHOCARDIOGRAM (TEE);  Surgeon: Larey Dresser, MD;  Location[facility] ENDOSCOPY;  Service: Cardiovascular;  Laterality: N/A;  . WISDOM TOOTH EXTRACTION      Current Medications: Current Meds  Medication Sig  . allopurinol (ZYLOPRIM) 100 MG tablet Take 100 mg by mouth daily. For gout  . colchicine 0.6 MG tablet Take 0.6 mg by mouth daily as  needed (gout flare).   . furosemide (LASIX) 40 MG tablet Take 1 tablet (40 mg total) by mouth 2 (two) times daily.  . metoprolol succinate (TOPROL-XL) 100 MG 24 hr tablet Take 1 tablet (100 mg total) by mouth daily. Take with or immediately following a meal.  . Omega-3 Fatty Acids (FISH OIL PO) Take 1 tablet by mouth daily.  . pantoprazole (PROTONIX) 40 MG tablet Take 1 tablet (40 mg total) by mouth daily.  Marland Kitchen sulfamethoxazole-trimethoprim (BACTRIM DS) 800-160 MG tablet Take 1 tablet by mouth 2 (two) times daily.  Alveda Reasons 20 MG TABS tablet Take 20 mg by mouth daily.  . [DISCONTINUED] buPROPion (WELLBUTRIN XL) 150 MG 24 hr tablet TAKE 1 TABLET BY MOUTH EVERY DAY (Patient taking differently: Take 150 mg by mouth daily. )     Allergies:   Patient has no known allergies.   Social History   Socioeconomic History  . Marital status: Married    Spouse name: Not on file  . Number of children: 4  . Years of education: 10  . Highest education level: Not on file  Occupational History  . Occupation: Truck Education administrator: [employer]  Tobacco Use  . Smoking status: Former Smoker    Types: Cigars    Quit date: 02/03/2016    Years since quitting: 3.5  . Smokeless tobacco: Never Used  Substance and Sexual Activity  . Alcohol use: Yes    Alcohol/week: 0.0 standard drinks    Comment: occ  . Drug use: No  . Sexual activity: Yes    Comment: 5 a day  Other Topics Concern  . Not on file  Social History Narrative  . Not on file   Social Determinants of Health   Financial Resource Strain:   . Difficulty of Paying Living Expenses: Not on file  Food Insecurity:   . Worried About Charity fundraiser in the Last Year: Not on file  . Ran Out of Food in the Last Year: Not on file  Transportation Needs:   . Lack of Transportation (Medical): Not on file  . Lack of Transportation (Non-Medical): Not on file  Physical Activity:   . Days of Exercise per Week: Not on file  . Minutes of  Exercise per Session: Not on file  Stress:   . Feeling of Stress : Not on file  Social Connections:   . Frequency of Communication with Friends and Family: Not on file  . Frequency of Social Gatherings with Friends and Family: Not on file  . Attends Religious Services: Not on file  . Active Member of Clubs or Organizations: Not on file  . Attends Archivist Meetings: Not on file  . Marital Status: Not on file     Family History: The patient's family history includes Heart attack in his father; Heart disease in his father; Hypertension in his father, maternal grandmother, mother, paternal uncle, paternal uncle, paternal uncle, paternal uncle, and paternal uncle; Stroke in  his father. There is no history of Colon cancer.  ROS:   Please see the history of present illness.     All other systems reviewed and are negative.  EKGs/Labs/Other Studies Reviewed:    The following studies were reviewed today:  TEE 07/14/2019 1. Left ventricular ejection fraction, by visual estimation, is 55 to 60%. The left ventricle has normal function. There is mildly increased left ventricular hypertrophy. Appears normal systolic function, though anterior wall is not well visualized  2. Left ventricular diastolic parameters are indeterminate.  3. Global right ventricle has normal systolic function.The right ventricular size is normal. No increase in right ventricular wall thickness.  4. Left atrial size was mildly dilated.  5. Right atrial size was normal.  6. The mitral valve is normal in structure. Mild mitral valve regurgitation.  7. The tricuspid valve is normal in structure. Tricuspid valve regurgitation is not demonstrated.  8. The aortic valve is tricuspid. Aortic valve regurgitation is not visualized. No evidence of aortic valve sclerosis or stenosis.  9. The pulmonic valve was not well visualized. Pulmonic valve regurgitation is not visualized. 10. The inferior vena cava is dilated in size  with >50% respiratory variability, suggesting right atrial pressure of 8 mmHg. 11. TR signal is inadequate for assessing pulmonary artery systolic pressure  EKG:  EKG is ordered today.  The ekg ordered today demonstrates atrial flutter with RVR, heart rate 139.  Recent Labs: 07/13/2019: B Natriuretic Peptide 234.6; Magnesium 1.9 07/14/2019: TSH 1.950 08/11/2019: Hemoglobin 13.6; Platelets 224 08/15/2019: BUN 22; Creatinine, Ser 1.84; Potassium 4.4; Sodium 142  Recent Lipid Panel    Component Value Date/Time   CHOL 211 (H) 01/16/2018 0916   TRIG 323 (H) 01/16/2018 0916   HDL 24 (L) 01/16/2018 0916   CHOLHDL 8.8 01/16/2018 0916   VLDL 65 (H) 01/16/2018 0916   LDLCALC 122 (H) 01/16/2018 0916    Physical Exam:    VS:  BP 126/88   Pulse (!) 139   Temp 99.6 F (37.6 C)   Ht 6\' 2"  (1.88 m)   Wt (!) 312 lb 9.6 oz (141.8 kg)   BMI 40.14 kg/m     Wt Readings from Last 3 Encounters:  08/15/19 (!) 312 lb 9.6 oz (141.8 kg)  08/11/19 (!) 310 lb 12.8 oz (141 kg)  07/15/19 (!) 300 lb 12.8 oz (136.4 kg)     GEN:  Well nourished, well developed in no acute distress HEENT: Normal NECK: No JVD; No carotid bruits LYMPHATICS: No lymphadenopathy CARDIAC: Tachycardic, no murmurs, rubs, gallops RESPIRATORY:  Clear to auscultation without rales, wheezing or rhonchi  ABDOMEN: Soft, non-tender, non-distended MUSCULOSKELETAL:  No edema; No deformity  SKIN: Warm and dry NEUROLOGIC:  Alert and oriented x 3 PSYCHIATRIC:  Normal affect   ASSESSMENT:    1. Atypical atrial flutter ([place])   2. Pre-procedural cardiovascular examination   3. Essential hypertension, benign   4. OSA on CPAP   5. Chronic diastolic heart failure (H[facility])    PLAN:    In order of problems listed above:  1. Atypical atrial flutter: Patient's heart rate did not change much with heart rate his previous metoprolol tartrate 25 mg twice daily was switched to 100 mg daily of Toprol-XL.  I have reviewed the case with DOD Dr.  Marisue Ivan.  I do not think increasing rate control I recommend any further will help with his heart rate.  At this time, we recommend arrange outpatient cardioversion as soon as possible and follow-up with atrial  fibrillation clinic afterward to consider antiarrhythmic therapy.    2. Chronic diastolic heart failure: Likely related to his fast heart rate.  He has gained 12 pounds recently.  He has mild bilateral lower extremity edema.  I recommended increase diuretic for the next 2 days before going back to the previous dose  3. Hypertension: Blood pressure stable  4. Obstructive sleep apnea: On CPAP.  This is the likely reason he does not have any fluid in his lung on physical exam.   Medication Adjustments/Labs and Tests Ordered: Current medicines are reviewed at length with the patient today.  Concerns regarding medicines are outlined above.  Orders Placed This Encounter  Procedures  . Basic metabolic panel  . EKG 12-Lead   No orders of the defined types were placed in this encounter.   Patient Instructions  Medication Instructions:   Take Lasix 40 mg daily 2 times a day for 2 days then back to 40 daily  *If you need a refill on your cardiac medications before your next appointment, please call your pharmacy*  Lab Work: You will need to have labs (blood work) drawn today:  BMET If you have labs (blood work) drawn today and your tests are completely normal, you will receive your results only by: Marland Kitchen MyChart Message (if you have MyChart) OR . A paper copy in the mail If you have any lab test that is abnormal or we need to change your treatment, we will call you to review the results.  Testing/Procedures: Your physician has recommended that you have a Cardioversion (DCCV). Electrical Cardioversion uses a jolt of electricity to your heart either through paddles or wired patches attached to your chest. This is a controlled, usually prescheduled, procedure. Defibrillation is done under light  anesthesia in the hospital, and you usually go home the day of the procedure. This is done to get your heart back into a normal rhythm. You are not awake for the procedure. Please see the instruction sheet given to you today.   You will also need to have a COVID test today for the Cardioversion  Follow-Up: At [facility], you and your health needs are our priority.  As part of our continuing mission to provide you with exceptional heart care, we have created designated Provider Care Teams.  These Care Teams include your primary Cardiologist (physician) and Advanced Practice Providers (APPs -  Physician Assistants and Nurse Practitioners) who all work together to provide you with the care you need, when you need it.  Your next appointment:   1 week(s) after Cardioversion  The format for your next appointment:   In Person  Provider:   Afib clinic   Other Instructions   Dear Joni Fears  You are scheduled for a TEE/Cardioversion/TEE Cardioversion on 08/19/2019 with Dr. Acie Fredrickson.  Please arrive at the [facility] (Main Entrance A) at [facility]: [address] [place], [place] [address] at 1:30PM. (1 hour prior to procedure unless lab work is needed; if lab work is needed arrive 1.5 hours ahead)  DIET: Nothing to eat or drink after midnight except a sip of water with medications (see medication instructions below)  Medication Instructions:  Continue your anticoagulant: Xarelto You will need to continue your anticoagulant after your procedure until you are told by your  Provider that it is safe to stop   Labs: If patient is on Coumadin, patient needs pt/INR, CBC, BMET within 3 days (No pt/INR needed for patients taking Xarelto, Eliquis, Pradaxa) For patients  receiving anesthesia for TEE and all Cardioversion patients: BMET, CBC within 1 week  Come to: [place] must have a responsible person to drive you home and stay in the waiting area during  your procedure. Failure to do so could result in cancellation.  Bring your insurance cards.  *Special Note: Every effort is made to have your procedure done on time. Occasionally there are emergencies that occur at the hospital that may cause delays. Please be patient if a delay does occur.       Hilbert Corrigan, [place]  08/17/2019 11:35 PM    [facility]

## 2019-08-15 NOTE — Patient Instructions (Addendum)
Medication Instructions:   Take Lasix 40 mg daily 2 times a day for 2 days then back to 40 daily  *If you need a refill on your cardiac medications before your next appointment, please call your pharmacy*  Lab Work: You will need to have labs (blood work) drawn today:  BMET If you have labs (blood work) drawn today and your tests are completely normal, you will receive your results only by: Marland Kitchen MyChart Message (if you have MyChart) OR . A paper copy in the mail If you have any lab test that is abnormal or we need to change your treatment, we will call you to review the results.  Testing/Procedures: Your physician has recommended that you have a Cardioversion (DCCV). Electrical Cardioversion uses a jolt of electricity to your heart either through paddles or wired patches attached to your chest. This is a controlled, usually prescheduled, procedure. Defibrillation is done under light anesthesia in the hospital, and you usually go home the day of the procedure. This is done to get your heart back into a normal rhythm. You are not awake for the procedure. Please see the instruction sheet given to you today.   You will also need to have a COVID test today for the Cardioversion  Follow-Up: At [facility], you and your health needs are our priority.  As part of our continuing mission to provide you with exceptional heart care, we have created designated Provider Care Teams.  These Care Teams include your primary Cardiologist (physician) and Advanced Practice Providers (APPs -  Physician Assistants and Nurse Practitioners) who all work together to provide you with the care you need, when you need it.  Your next appointment:   1 week(s) after Cardioversion  The format for your next appointment:   In Person  Provider:   Afib clinic   Other Instructions   Dear Samuel Flynn  You are scheduled for a TEE/Cardioversion/TEE Cardioversion on 08/19/2019 with Dr. Acie Fredrickson.  Please arrive at the  [facility] (Main Entrance A) at [facility]: [address] [place], [place] [address] at 1:30PM. (1 hour prior to procedure unless lab work is needed; if lab work is needed arrive 1.5 hours ahead)  DIET: Nothing to eat or drink after midnight except a sip of water with medications (see medication instructions below)  Medication Instructions:  Continue your anticoagulant: Xarelto You will need to continue your anticoagulant after your procedure until you are told by your  Provider that it is safe to stop   Labs: If patient is on Coumadin, patient needs pt/INR, CBC, BMET within 3 days (No pt/INR needed for patients taking Xarelto, Eliquis, Pradaxa) For patients receiving anesthesia for TEE and all Cardioversion patients: BMET, CBC within 1 week  Come to: [place] must have a responsible person to drive you home and stay in the waiting area during your procedure. Failure to do so could result in cancellation.  Bring your insurance cards.  *Special Note: Every effort is made to have your procedure done on time. Occasionally there are emergencies that occur at the hospital that may cause delays. Please be patient if a delay does occur.

## 2019-08-15 NOTE — H&P (View-Only) (Signed)
Cardiology Office Note:    Date:  08/17/2019   ID:  Samuel Flynn, DOB 10-11-1975, MRN [id number]  PCP:  Scot Jun, FNP  Cardiologist:  Skeet Latch, MD  Electrophysiologist:  Constance Haw, MD   Referring MD: Scot Jun, FNP   Chief Complaint  Patient presents with  . Atrial Fibrillation  . Chest Pain  . Edema    legs     History of Present Illness:    Samuel Flynn is a 43 y.o. male with a hx of atrial fibrillation s/p ablation in 04/2018, hypertension, obesity, obstructive sleep apnea on CPAP, and CKD.  Patient was initially diagnosed with atrial fibrillation in 2017.  He was last seen by A. fib clinic in November 2019, he has self discontinued his amiodarone and the Xarelto was also discontinued given lack of recurrent atrial fibrillation.  More recently, patient presented back to the hospital in November 2020 with palpitation and mild fluid volume overload.  He was found to be in atypical flutter with heart rate of up to 160 bpm.  Patient was treated with IV diltiazem and restarted on the Xarelto.  Repeat echocardiogram obtained during this admission showed EF 55 to 60%, mild MR.  Overnight, he had heart rate in the upper 40s on the Cardizem, rate control agent was eventually switched to metoprolol 25 mg twice daily.  After diuresis, his discharge weight was 300 pounds.  He was seen by his primary care provider on 08/11/2019, he was noted to be in atrial fibrillation with RVR.  His PCP spoke with on-call cardiologist who recommended him to go to the emergency room however patient refused.  His previous metoprolol tartrate was switched to Toprol-XL 100 mg daily.  Patient presents today for cardiology office visit.  His heart rate is 139 bpm.  He continues to have weakness and shortness of breath along with dizziness.  His current weight is 312 pounds which is 12 pounds heavier than his discharge dry weight.  I instructed him to increase Lasix to 40 mg  twice daily for the next 2 days before going back to the previous dose of 40 mg daily.  I discussed his EKG finding with Dr. Audie Box, DOD, I do not think any additional rate control agent will help him.  He has been absolutely compliant with Xarelto, we will arrange for outpatient DC cardioversion.  Risk and benefit of the procedure has been explained to the patient was agreeable to proceed.  Afterward, he will be referred back to the atrial fibrillation clinic to consider antiarrhythmic therapy.  His possible antiarrhythmic candidate agents include amiodarone, Tikosyn, and sotalol.    Past Medical History:  Diagnosis Date  . Atrial fibrillation with RVR ([place])   . Gastric ulcer   . Gout   . Hypertension   . Paroxysmal A-fib ([place])   . Sleep apnea    USES CPAP  . Wears glasses     Past Surgical History:  Procedure Laterality Date  . ATRIAL FIBRILLATION ABLATION N/A 04/26/2018   Procedure: ATRIAL FIBRILLATION ABLATION;  Surgeon: Constance Haw, MD;  Location: [place] CV LAB;  Service: Cardiovascular;  Laterality: N/A;  . CARDIOVERSION N/A 01/18/2018   Procedure: CARDIOVERSION;  Surgeon: Larey Dresser, MD;  Location: [facility] ENDOSCOPY;  Service: Cardiovascular;  Laterality: N/A;  . ELBOW LIGAMENT RECONSTRUCTION Right 09/29/2014   Procedure: Merian Capron FRACTURE FIXATION, POSSIBLE LIGAMENT REPAIR;  Surgeon: Nita Sells, MD;  Location: [place];  Service: Orthopedics;  Laterality: Right;  Right elbow coranoid fracture fixation, possible ligament repair  . TEE WITHOUT CARDIOVERSION N/A 01/18/2018   Procedure: TRANSESOPHAGEAL ECHOCARDIOGRAM (TEE);  Surgeon: Larey Dresser, MD;  Location: [facility] ENDOSCOPY;  Service: Cardiovascular;  Laterality: N/A;  . WISDOM TOOTH EXTRACTION      Current Medications: Current Meds  Medication Sig  . allopurinol (ZYLOPRIM) 100 MG tablet Take 100 mg by mouth daily. For gout  . colchicine 0.6 MG tablet Take 0.6 mg by mouth daily as  needed (gout flare).   . furosemide (LASIX) 40 MG tablet Take 1 tablet (40 mg total) by mouth 2 (two) times daily.  . metoprolol succinate (TOPROL-XL) 100 MG 24 hr tablet Take 1 tablet (100 mg total) by mouth daily. Take with or immediately following a meal.  . Omega-3 Fatty Acids (FISH OIL PO) Take 1 tablet by mouth daily.  . pantoprazole (PROTONIX) 40 MG tablet Take 1 tablet (40 mg total) by mouth daily.  Marland Kitchen sulfamethoxazole-trimethoprim (BACTRIM DS) 800-160 MG tablet Take 1 tablet by mouth 2 (two) times daily.  Alveda Reasons 20 MG TABS tablet Take 20 mg by mouth daily.  . [DISCONTINUED] buPROPion (WELLBUTRIN XL) 150 MG 24 hr tablet TAKE 1 TABLET BY MOUTH EVERY DAY (Patient taking differently: Take 150 mg by mouth daily. )     Allergies:   Patient has no known allergies.   Social History   Socioeconomic History  . Marital status: Married    Spouse name: Not on file  . Number of children: 4  . Years of education: 10  . Highest education level: Not on file  Occupational History  . Occupation: Truck Education administrator: [employer]  Tobacco Use  . Smoking status: Former Smoker    Types: Cigars    Quit date: 02/03/2016    Years since quitting: 3.5  . Smokeless tobacco: Never Used  Substance and Sexual Activity  . Alcohol use: Yes    Alcohol/week: 0.0 standard drinks    Comment: occ  . Drug use: No  . Sexual activity: Yes    Comment: 5 a day  Other Topics Concern  . Not on file  Social History Narrative  . Not on file   Social Determinants of Health   Financial Resource Strain:   . Difficulty of Paying Living Expenses: Not on file  Food Insecurity:   . Worried About Charity fundraiser in the Last Year: Not on file  . Ran Out of Food in the Last Year: Not on file  Transportation Needs:   . Lack of Transportation (Medical): Not on file  . Lack of Transportation (Non-Medical): Not on file  Physical Activity:   . Days of Exercise per Week: Not on file  . Minutes of  Exercise per Session: Not on file  Stress:   . Feeling of Stress : Not on file  Social Connections:   . Frequency of Communication with Friends and Family: Not on file  . Frequency of Social Gatherings with Friends and Family: Not on file  . Attends Religious Services: Not on file  . Active Member of Clubs or Organizations: Not on file  . Attends Archivist Meetings: Not on file  . Marital Status: Not on file     Family History: The patient's family history includes Heart attack in his father; Heart disease in his father; Hypertension in his father, maternal grandmother, mother, paternal uncle, paternal uncle, paternal uncle, paternal uncle, and paternal uncle; Stroke in  his father. There is no history of Colon cancer.  ROS:   Please see the history of present illness.     All other systems reviewed and are negative.  EKGs/Labs/Other Studies Reviewed:    The following studies were reviewed today:  TEE 07/14/2019 1. Left ventricular ejection fraction, by visual estimation, is 55 to 60%. The left ventricle has normal function. There is mildly increased left ventricular hypertrophy. Appears normal systolic function, though anterior wall is not well visualized  2. Left ventricular diastolic parameters are indeterminate.  3. Global right ventricle has normal systolic function.The right ventricular size is normal. No increase in right ventricular wall thickness.  4. Left atrial size was mildly dilated.  5. Right atrial size was normal.  6. The mitral valve is normal in structure. Mild mitral valve regurgitation.  7. The tricuspid valve is normal in structure. Tricuspid valve regurgitation is not demonstrated.  8. The aortic valve is tricuspid. Aortic valve regurgitation is not visualized. No evidence of aortic valve sclerosis or stenosis.  9. The pulmonic valve was not well visualized. Pulmonic valve regurgitation is not visualized. 10. The inferior vena cava is dilated in size  with >50% respiratory variability, suggesting right atrial pressure of 8 mmHg. 11. TR signal is inadequate for assessing pulmonary artery systolic pressure  EKG:  EKG is ordered today.  The ekg ordered today demonstrates atrial flutter with RVR, heart rate 139.  Recent Labs: 07/13/2019: B Natriuretic Peptide 234.6; Magnesium 1.9 07/14/2019: TSH 1.950 08/11/2019: Hemoglobin 13.6; Platelets 224 08/15/2019: BUN 22; Creatinine, Ser 1.84; Potassium 4.4; Sodium 142  Recent Lipid Panel    Component Value Date/Time   CHOL 211 (H) 01/16/2018 0916   TRIG 323 (H) 01/16/2018 0916   HDL 24 (L) 01/16/2018 0916   CHOLHDL 8.8 01/16/2018 0916   VLDL 65 (H) 01/16/2018 0916   LDLCALC 122 (H) 01/16/2018 0916    Physical Exam:    VS:  BP 126/88   Pulse (!) 139   Temp 99.6 F (37.6 C)   Ht 6\' 2"  (1.88 m)   Wt (!) 312 lb 9.6 oz (141.8 kg)   BMI 40.14 kg/m     Wt Readings from Last 3 Encounters:  08/15/19 (!) 312 lb 9.6 oz (141.8 kg)  08/11/19 (!) 310 lb 12.8 oz (141 kg)  07/15/19 (!) 300 lb 12.8 oz (136.4 kg)     GEN:  Well nourished, well developed in no acute distress HEENT: Normal NECK: No JVD; No carotid bruits LYMPHATICS: No lymphadenopathy CARDIAC: Tachycardic, no murmurs, rubs, gallops RESPIRATORY:  Clear to auscultation without rales, wheezing or rhonchi  ABDOMEN: Soft, non-tender, non-distended MUSCULOSKELETAL:  No edema; No deformity  SKIN: Warm and dry NEUROLOGIC:  Alert and oriented x 3 PSYCHIATRIC:  Normal affect   ASSESSMENT:    1. Atypical atrial flutter ([place])   2. Pre-procedural cardiovascular examination   3. Essential hypertension, benign   4. OSA on CPAP   5. Chronic diastolic heart failure (HCC)    PLAN:    In order of problems listed above:  1. Atypical atrial flutter: Patient's heart rate did not change much with heart rate his previous metoprolol tartrate 25 mg twice daily was switched to 100 mg daily of Toprol-XL.  I have reviewed the case with DOD Dr.  Marisue Ivan.  I do not think increasing rate control I recommend any further will help with his heart rate.  At this time, we recommend arrange outpatient cardioversion as soon as possible and follow-up with atrial  fibrillation clinic afterward to consider antiarrhythmic therapy.    2. Chronic diastolic heart failure: Likely related to his fast heart rate.  He has gained 12 pounds recently.  He has mild bilateral lower extremity edema.  I recommended increase diuretic for the next 2 days before going back to the previous dose  3. Hypertension: Blood pressure stable  4. Obstructive sleep apnea: On CPAP.  This is the likely reason he does not have any fluid in his lung on physical exam.   Medication Adjustments/Labs and Tests Ordered: Current medicines are reviewed at length with the patient today.  Concerns regarding medicines are outlined above.  Orders Placed This Encounter  Procedures  . Basic metabolic panel  . EKG 12-Lead   No orders of the defined types were placed in this encounter.   Patient Instructions  Medication Instructions:   Take Lasix 40 mg daily 2 times a day for 2 days then back to 40 daily  *If you need a refill on your cardiac medications before your next appointment, please call your pharmacy*  Lab Work: You will need to have labs (blood work) drawn today:  BMET If you have labs (blood work) drawn today and your tests are completely normal, you will receive your results only by: Marland Kitchen MyChart Message (if you have MyChart) OR . A paper copy in the mail If you have any lab test that is abnormal or we need to change your treatment, we will call you to review the results.  Testing/Procedures: Your physician has recommended that you have a Cardioversion (DCCV). Electrical Cardioversion uses a jolt of electricity to your heart either through paddles or wired patches attached to your chest. This is a controlled, usually prescheduled, procedure. Defibrillation is done under light  anesthesia in the hospital, and you usually go home the day of the procedure. This is done to get your heart back into a normal rhythm. You are not awake for the procedure. Please see the instruction sheet given to you today.   You will also need to have a COVID test today for the Cardioversion  Follow-Up: At [facility], you and your health needs are our priority.  As part of our continuing mission to provide you with exceptional heart care, we have created designated Provider Care Teams.  These Care Teams include your primary Cardiologist (physician) and Advanced Practice Providers (APPs -  Physician Assistants and Nurse Practitioners) who all work together to provide you with the care you need, when you need it.  Your next appointment:   1 week(s) after Cardioversion  The format for your next appointment:   In Person  Provider:   Afib clinic   Other Instructions   Dear Samuel Flynn  You are scheduled for a TEE/Cardioversion/TEE Cardioversion on 08/19/2019 with Dr. Acie Fredrickson.  Please arrive at the [facility] (Main Entrance A) at [facility]: [address] [place], [place] [address] at 1:30PM. (1 hour prior to procedure unless lab work is needed; if lab work is needed arrive 1.5 hours ahead)  DIET: Nothing to eat or drink after midnight except a sip of water with medications (see medication instructions below)  Medication Instructions:  Continue your anticoagulant: Xarelto You will need to continue your anticoagulant after your procedure until you are told by your  Provider that it is safe to stop   Labs: If patient is on Coumadin, patient needs pt/INR, CBC, BMET within 3 days (No pt/INR needed for patients taking Xarelto, Eliquis, Pradaxa) For patients  receiving anesthesia for TEE and all Cardioversion patients: BMET, CBC within 1 week  Come to: [facility] must have a responsible person to drive you home and stay in the waiting area during  your procedure. Failure to do so could result in cancellation.  Bring your insurance cards.  *Special Note: Every effort is made to have your procedure done on time. Occasionally there are emergencies that occur at the hospital that may cause delays. Please be patient if a delay does occur.       Hilbert Corrigan, [place]  08/17/2019 11:35 PM    [facility]

## 2019-08-16 LAB — BASIC METABOLIC PANEL WITH GFR
BUN/Creatinine Ratio: 12 (ref 9–20)
BUN: 22 mg/dL (ref 6–24)
CO2: 24 mmol/L (ref 20–29)
Calcium: 8.9 mg/dL (ref 8.7–10.2)
Chloride: 103 mmol/L (ref 96–106)
Creatinine, Ser: 1.84 mg/dL — ABNORMAL HIGH (ref 0.76–1.27)
GFR calc Af Amer: 50 mL/min/{1.73_m2} — ABNORMAL LOW
GFR calc non Af Amer: 43 mL/min/{1.73_m2} — ABNORMAL LOW
Glucose: 85 mg/dL (ref 65–99)
Potassium: 4.4 mmol/L (ref 3.5–5.2)
Sodium: 142 mmol/L (ref 134–144)

## 2019-08-16 LAB — NOVEL CORONAVIRUS, NAA (HOSP ORDER, SEND-OUT TO REF LAB; TAT 18-24 HRS): SARS-CoV-2, NAA: NOT DETECTED

## 2019-08-17 ENCOUNTER — Encounter: Payer: Self-pay | Admitting: Physician Assistant

## 2019-08-18 ENCOUNTER — Other Ambulatory Visit: Payer: Self-pay | Admitting: Physician Assistant

## 2019-08-19 ENCOUNTER — Ambulatory Visit (HOSPITAL_COMMUNITY)
Admission: RE | Admit: 2019-08-19 | Discharge: 2019-08-19 | Disposition: A | Discharge status: Home / Self Care | Payer: PRIVATE HEALTH INSURANCE | Attending: Cardiovascular Disease | Admitting: Cardiovascular Disease

## 2019-08-19 ENCOUNTER — Encounter (HOSPITAL_COMMUNITY): Payer: Self-pay | Admitting: Cardiovascular Disease

## 2019-08-19 ENCOUNTER — Ambulatory Visit (HOSPITAL_COMMUNITY): Payer: PRIVATE HEALTH INSURANCE | Admitting: Certified Registered Nurse Anesthetist

## 2019-08-19 ENCOUNTER — Encounter (HOSPITAL_COMMUNITY)
Admission: RE | Disposition: A | Discharge status: Home / Self Care | Payer: Self-pay | Source: Home / Self Care | Attending: Cardiovascular Disease

## 2019-08-19 ENCOUNTER — Other Ambulatory Visit: Payer: Self-pay

## 2019-08-19 DIAGNOSIS — Z79899 Other long term (current) drug therapy: Secondary | ICD-10-CM | POA: Insufficient documentation

## 2019-08-19 DIAGNOSIS — Z8249 Family history of ischemic heart disease and other diseases of the circulatory system: Secondary | ICD-10-CM | POA: Insufficient documentation

## 2019-08-19 DIAGNOSIS — M109 Gout, unspecified: Secondary | ICD-10-CM | POA: Diagnosis not present

## 2019-08-19 DIAGNOSIS — N189 Chronic kidney disease, unspecified: Secondary | ICD-10-CM | POA: Insufficient documentation

## 2019-08-19 DIAGNOSIS — Z87891 Personal history of nicotine dependence: Secondary | ICD-10-CM | POA: Diagnosis not present

## 2019-08-19 DIAGNOSIS — I480 Paroxysmal atrial fibrillation: Secondary | ICD-10-CM | POA: Diagnosis present

## 2019-08-19 DIAGNOSIS — I4819 Other persistent atrial fibrillation: Secondary | ICD-10-CM

## 2019-08-19 DIAGNOSIS — I484 Atypical atrial flutter: Secondary | ICD-10-CM | POA: Insufficient documentation

## 2019-08-19 DIAGNOSIS — R9431 Abnormal electrocardiogram [ECG] [EKG]: Secondary | ICD-10-CM | POA: Insufficient documentation

## 2019-08-19 DIAGNOSIS — Z6841 Body Mass Index (BMI) 40.0 and over, adult: Secondary | ICD-10-CM | POA: Diagnosis not present

## 2019-08-19 DIAGNOSIS — Z7901 Long term (current) use of anticoagulants: Secondary | ICD-10-CM | POA: Diagnosis not present

## 2019-08-19 DIAGNOSIS — I130 Hypertensive heart and chronic kidney disease with heart failure and stage 1 through stage 4 chronic kidney disease, or unspecified chronic kidney disease: Secondary | ICD-10-CM | POA: Insufficient documentation

## 2019-08-19 DIAGNOSIS — G4733 Obstructive sleep apnea (adult) (pediatric): Secondary | ICD-10-CM | POA: Insufficient documentation

## 2019-08-19 DIAGNOSIS — E669 Obesity, unspecified: Secondary | ICD-10-CM | POA: Insufficient documentation

## 2019-08-19 DIAGNOSIS — I5032 Chronic diastolic (congestive) heart failure: Secondary | ICD-10-CM | POA: Insufficient documentation

## 2019-08-19 HISTORY — PX: CARDIOVERSION: SHX1299

## 2019-08-19 SURGERY — CARDIOVERSION
Anesthesia: General

## 2019-08-19 MED ORDER — LIDOCAINE 2% (20 MG/ML) 5 ML SYRINGE
INTRAMUSCULAR | Status: DC | PRN
  Administered 2019-08-19: 40 mg via INTRAVENOUS

## 2019-08-19 MED ORDER — SODIUM CHLORIDE 0.9 % IV SOLN: INTRAVENOUS | Status: DC | PRN

## 2019-08-19 MED ORDER — PROPOFOL 10 MG/ML IV BOLUS
INTRAVENOUS | Status: DC | PRN
  Administered 2019-08-19: 50 mg via INTRAVENOUS
  Administered 2019-08-19: 100 mg via INTRAVENOUS

## 2019-08-19 NOTE — Anesthesia Postprocedure Evaluation (Signed)
Anesthesia Post Note  Patient: Samuel Flynn  Procedure(s) Performed: CARDIOVERSION (N/A )     Patient location during evaluation: Endoscopy Anesthesia Type: MAC Level of consciousness: awake and alert Pain management: pain level controlled Vital Signs Assessment: post-procedure vital signs reviewed and stable Respiratory status: spontaneous breathing, nonlabored ventilation and respiratory function stable Cardiovascular status: blood pressure returned to baseline and stable Postop Assessment: no apparent nausea or vomiting Anesthetic complications: no    Last Vitals:  Vitals:   08/19/19 1420 08/19/19 1430  BP: 114/62 108/67  Pulse: 64 63  Resp: 20 20  Temp:    SpO2: 97% 97%    Last Pain:  Vitals:   08/19/19 1430  TempSrc:   PainSc: 0-No pain                 Lidia Collum

## 2019-08-19 NOTE — CV Procedure (Signed)
    Cardioversion Note  JAYMISON DENSMORE [id number] May 29, 1976  Procedure: DC Cardioversion Indications:  Atrial fib   Procedure Details Consent: Obtained Time Out: Verified patient identification, verified procedure, site/side was marked, verified correct patient position, special equipment/implants available, Radiology Safety Procedures followed,  medications/allergies/relevent history reviewed, required imaging and test results available.  Performed  The patient has been on adequate anticoagulation.  The patient received IV  Lidocaine 40 mg followed by Propofol 150 mg IV  for sedation.  Synchronous cardioversion was performed at  200, 200  joules.  The cardioversion was successful     Complications: No apparent complications Patient did tolerate procedure well.   Thayer Headings, Brooke Bonito., MD, [facility] 08/19/2019, 2:09 PM

## 2019-08-19 NOTE — Anesthesia Preprocedure Evaluation (Signed)
Anesthesia Evaluation  Patient identified by MRN, date of birth, ID band Patient awake    Reviewed: Allergy & Precautions, NPO status , Patient's Chart, lab work & pertinent test results, reviewed documented beta blocker date and time   History of Anesthesia Complications Negative for: history of anesthetic complications  Airway Mallampati: III  TM Distance: >3 FB Neck ROM: Full    Dental no notable dental hx.    Pulmonary sleep apnea and Continuous Positive Airway Pressure Ventilation , former smoker,    Pulmonary exam normal        Cardiovascular hypertension, Pt. on home beta blockers and Pt. on medications Normal cardiovascular exam+ dysrhythmias (on Xarelto) Atrial Fibrillation   TTE 07/2019: EF 55-60%, mild LAE, mild MR   Neuro/Psych Anxiety negative neurological ROS     GI/Hepatic Neg liver ROS, PUD, GERD  Medicated and Controlled,  Endo/Other  negative endocrine ROS  Renal/GU negative Renal ROS  negative genitourinary   Musculoskeletal negative musculoskeletal ROS (+)   Abdominal   Peds  Hematology negative hematology ROS (+)   Anesthesia Other Findings Day of surgery medications reviewed with patient.  Reproductive/Obstetrics negative OB ROS                             Anesthesia Physical Anesthesia Plan  ASA: III  Anesthesia Plan: General   Post-op Pain Management:    Induction: Intravenous  PONV Risk Score and Plan: Treatment may vary due to age or medical condition and Propofol infusion  Airway Management Planned: Mask  Additional Equipment: None  Intra-op Plan:   Post-operative Plan:   Informed Consent: I have reviewed the patients History and Physical, chart, labs and discussed the procedure including the risks, benefits and alternatives for the proposed anesthesia with the patient or authorized representative who has indicated his/her understanding and  acceptance.     Dental advisory given  Plan Discussed with: CRNA  Anesthesia Plan Comments:         Anesthesia Quick Evaluation

## 2019-08-19 NOTE — Interval H&P Note (Signed)
History and Physical Interval Note:  08/19/2019 1:47 PM  Samuel Flynn  has presented today for surgery, with the diagnosis of AFLUTTER.  The various methods of treatment have been discussed with the patient and family. After consideration of risks, benefits and other options for treatment, the patient has consented to  Procedure(s): CARDIOVERSION (N/A) as a surgical intervention.  The patient's history has been reviewed, patient examined, no change in status, stable for surgery.  I have reviewed the patient's chart and labs.  Questions were answered to the patient's satisfaction.     [name]

## 2019-08-19 NOTE — Transfer of Care (Signed)
Immediate Anesthesia Transfer of Care Note  Patient: Samuel Flynn  Procedure(s) Performed: CARDIOVERSION (N/A )  Patient Location: Endoscopy Unit  Anesthesia Type:General  Level of Consciousness: drowsy and patient cooperative  Airway & Oxygen Therapy: Patient Spontanous Breathing  Post-op Assessment: Report given to RN and Post -op Vital signs reviewed and stable  Post vital signs: Reviewed and stable  Last Vitals:  Vitals Value Taken Time  BP 110/78   Temp    Pulse 64   Resp 16   SpO2 99     Last Pain:  Vitals:   08/19/19 1310  TempSrc: Oral  PainSc: 8          Complications: No apparent anesthesia complications

## 2019-08-19 NOTE — Discharge Instructions (Signed)

## 2019-08-25 ENCOUNTER — Other Ambulatory Visit: Payer: Self-pay

## 2019-08-25 ENCOUNTER — Encounter (HOSPITAL_COMMUNITY): Payer: Self-pay | Admitting: Nurse Practitioner

## 2019-08-25 ENCOUNTER — Ambulatory Visit (HOSPITAL_COMMUNITY)
Admission: RE | Admit: 2019-08-25 | Discharge: 2019-08-25 | Disposition: A | Discharge status: Home / Self Care | Payer: PRIVATE HEALTH INSURANCE | Source: Ambulatory Visit | Attending: Nurse Practitioner | Admitting: Nurse Practitioner

## 2019-08-25 ENCOUNTER — Telehealth: Payer: Self-pay | Admitting: Cardiology

## 2019-08-25 VITALS — BP 130/100 | HR 164 | Ht 74.0 in | Wt 320.0 lb

## 2019-08-25 DIAGNOSIS — Z8249 Family history of ischemic heart disease and other diseases of the circulatory system: Secondary | ICD-10-CM | POA: Diagnosis not present

## 2019-08-25 DIAGNOSIS — D6869 Other thrombophilia: Secondary | ICD-10-CM | POA: Diagnosis not present

## 2019-08-25 DIAGNOSIS — Z7901 Long term (current) use of anticoagulants: Secondary | ICD-10-CM | POA: Insufficient documentation

## 2019-08-25 DIAGNOSIS — G4730 Sleep apnea, unspecified: Secondary | ICD-10-CM | POA: Diagnosis not present

## 2019-08-25 DIAGNOSIS — Z87891 Personal history of nicotine dependence: Secondary | ICD-10-CM | POA: Diagnosis not present

## 2019-08-25 DIAGNOSIS — I480 Paroxysmal atrial fibrillation: Secondary | ICD-10-CM | POA: Insufficient documentation

## 2019-08-25 DIAGNOSIS — Z79899 Other long term (current) drug therapy: Secondary | ICD-10-CM | POA: Diagnosis not present

## 2019-08-25 DIAGNOSIS — I110 Hypertensive heart disease with heart failure: Secondary | ICD-10-CM | POA: Diagnosis not present

## 2019-08-25 DIAGNOSIS — I5032 Chronic diastolic (congestive) heart failure: Secondary | ICD-10-CM | POA: Insufficient documentation

## 2019-08-25 DIAGNOSIS — I484 Atypical atrial flutter: Secondary | ICD-10-CM | POA: Diagnosis not present

## 2019-08-25 MED ORDER — AMIODARONE HCL 200 MG PO TABS: ORAL_TABLET | ORAL | 0 refills | Status: DC

## 2019-08-25 NOTE — Progress Notes (Signed)
Primary Care Physician: Scot Jun, FNP Referring Physician: Dr. Michele Rockers is a 43 y.o. male with a h/o afib with RVR, s/p ablation [id number], diastolic heart failure, morbid obesity that is in afib clinic with afib with RVR. Pt had  successful cardioversion 08/19/19 but it only lasted 2 days. He noted HR's around 200 bpm last night. Today in the office he is running 160's bpm,  He is tolerating. He had issues with RVR  last August and was placed on amiodarone then as a bridge to ablation. Discussed with pt and he is  willing to do this again as it appears he may need a redo ablation. He is wearing cpap. No alcohol, or tobacco. He has gained around 30 lbs in the last year. CHA2DS2VASc score of 2. He is compliant with CPAP.  Today, he denies symptoms of  chest pain,+for  shortness of breath, orthopnea, PND, lower extremity edema, dizziness, presyncope, syncope, or neurologic sequela. The patient is tolerating medications without difficulties and is otherwise without complaint today.   Past Medical History:  Diagnosis Date  . Atrial fibrillation with RVR ([place])   . Gastric ulcer   . Gout   . Hypertension   . Paroxysmal A-fib ([place])   . Sleep apnea    USES CPAP  . Wears glasses    Past Surgical History:  Procedure Laterality Date  . ATRIAL FIBRILLATION ABLATION N/A 04/26/2018   Procedure: ATRIAL FIBRILLATION ABLATION;  Surgeon: Constance Haw, MD;  Location: [place] [facility] LAB;  Service: Cardiovascular;  Laterality: N/A;  . CARDIOVERSION N/A 01/18/2018   Procedure: CARDIOVERSION;  Surgeon: Larey Dresser, MD;  Location: [facility] ENDOSCOPY;  Service: Cardiovascular;  Laterality: N/A;  . CARDIOVERSION N/A 08/19/2019   Procedure: CARDIOVERSION;  Surgeon: Thayer Headings, MD;  Location: [facility] ENDOSCOPY;  Service: Cardiovascular;  Laterality: N/A;  . ELBOW LIGAMENT RECONSTRUCTION Right 09/29/2014   Procedure: Merian Capron FRACTURE FIXATION, POSSIBLE LIGAMENT REPAIR;  Surgeon:  Nita Sells, MD;  Location: [facility];  Service: Orthopedics;  Laterality: Right;  Right elbow coranoid fracture fixation, possible ligament repair  . TEE WITHOUT CARDIOVERSION N/A 01/18/2018   Procedure: TRANSESOPHAGEAL ECHOCARDIOGRAM (TEE);  Surgeon: Larey Dresser, MD;  Location: [facility] ENDOSCOPY;  Service: Cardiovascular;  Laterality: N/A;  . WISDOM TOOTH EXTRACTION      Current Outpatient Medications  Medication Sig Dispense Refill  . allopurinol (ZYLOPRIM) 100 MG tablet Take 100 mg by mouth daily. For gout    . colchicine 0.6 MG tablet Take 0.6 mg by mouth daily as needed (gout flare).   1  . furosemide (LASIX) 40 MG tablet Take 1 tablet (40 mg total) by mouth 2 (two) times daily. 60 tablet 6  . ibuprofen (ADVIL) 200 MG tablet Take 800 mg by mouth every 8 (eight) hours as needed (pain).    . metoprolol succinate (TOPROL-XL) 100 MG 24 hr tablet Take 1 tablet (100 mg total) by mouth daily. Take with or immediately following a meal. 90 tablet 3  . pantoprazole (PROTONIX) 40 MG tablet Take 1 tablet (40 mg total) by mouth daily. 90 tablet 4  . XARELTO 20 MG TABS tablet Take 20 mg by mouth daily.     No current facility-administered medications for this encounter.    No Known Allergies  Social History   Socioeconomic History  . Marital status: Married    Spouse name: Not on file  . Number of children: 4  . Years of education:  10  . Highest education level: Not on file  Occupational History  . Occupation: Truck Education administrator: [employer]  Tobacco Use  . Smoking status: Former Smoker    Types: Cigars    Quit date: 02/03/2016    Years since quitting: 3.5  . Smokeless tobacco: Never Used  Substance and Sexual Activity  . Alcohol use: Yes    Alcohol/week: 0.0 standard drinks    Comment: occ  . Drug use: No  . Sexual activity: Yes    Comment: 5 a day  Other Topics Concern  . Not on file  Social History Narrative  . Not on file   Social  Determinants of Health   Financial Resource Strain:   . Difficulty of Paying Living Expenses: Not on file  Food Insecurity:   . Worried About Charity fundraiser in the Last Year: Not on file  . Ran Out of Food in the Last Year: Not on file  Transportation Needs:   . Lack of Transportation (Medical): Not on file  . Lack of Transportation (Non-Medical): Not on file  Physical Activity:   . Days of Exercise per Week: Not on file  . Minutes of Exercise per Session: Not on file  Stress:   . Feeling of Stress : Not on file  Social Connections:   . Frequency of Communication with Friends and Family: Not on file  . Frequency of Social Gatherings with Friends and Family: Not on file  . Attends Religious Services: Not on file  . Active Member of Clubs or Organizations: Not on file  . Attends Archivist Meetings: Not on file  . Marital Status: Not on file  Intimate Partner Violence:   . Fear of Current or Ex-Partner: Not on file  . Emotionally Abused: Not on file  . Physically Abused: Not on file  . Sexually Abused: Not on file    Family History  Problem Relation Age of Onset  . Hypertension Paternal Uncle   . Hypertension Maternal Grandmother   . Hypertension Paternal Uncle   . Hypertension Paternal Uncle   . Hypertension Paternal Uncle   . Hypertension Paternal Uncle   . Hypertension Mother   . Stroke Father   . Heart disease Father   . Heart attack Father   . Hypertension Father   . Colon cancer Neg Hx     ROS- All systems are reviewed and negative except as per the HPI above  Physical Exam: Vitals:   08/25/19 1358  BP: (!) 130/100  Pulse: (!) 164  SpO2: 97%  Weight: (!) 145.2 kg  Height: 6\' 2"  (1.88 m)   Wt Readings from Last 3 Encounters:  08/25/19 (!) 145.2 kg  08/19/19 (!) 138.3 kg  08/15/19 (!) 141.8 kg    Labs: Lab Results  Component Value Date   NA 142 08/15/2019   K 4.4 08/15/2019   CL 103 08/15/2019   CO2 24 08/15/2019   GLUCOSE 85  08/15/2019   BUN 22 08/15/2019   CREATININE 1.84 (H) 08/15/2019   CALCIUM 8.9 08/15/2019   MG 1.9 07/13/2019   Lab Results  Component Value Date   INR 1.17 03/24/2018   Lab Results  Component Value Date   CHOL 211 (H) 01/16/2018   HDL 24 (L) 01/16/2018   LDLCALC 122 (H) 01/16/2018   TRIG 323 (H) 01/16/2018     GEN- The patient is well appearing, alert and oriented x 3 today.   Head- normocephalic,  atraumatic Eyes-  Sclera clear, conjunctiva pink Ears- hearing intact Oropharynx- clear Neck- supple, no JVP Lymph- no cervical lymphadenopathy Lungs- Clear to ausculation bilaterally, normal work of breathing Heart- Rapid regular rate and rhythm, no murmurs, rubs or gallops, PMI not laterally displaced GI- soft, NT, ND, + BS Extremities- no clubbing, cyanosis, or edema MS- no significant deformity or atrophy Skin- no rash or lesion Psych- euthymic mood, full affect Neuro- strength and sensation are intact  EKG-atrial flutter with 2:1 AV conduction at 164 ms  Epic records reviewed   Assessment and Plan: 1. Atrial fib/flutter with RVR Ablation last year 04/2018 Now with return of arrhythmia with successful cardioversion and ERAF after 2 days of cardioversion Has h/o being hard to rate control I don't think sending him to the ER  for another DCCV will be a good solution as I fear ERAF  Discussed  with pt starting back on amiodarone for rate control and  as a bridge for a redo ablation down the road   He is in agreement  He toleated amiodarone well in the past, stopped it after ablation as he was in SR  He will continue BB at current dose He will start amiodarone 400 mg bid until I see him back on Wednesday   2. CHA2DS2VASc score of 2 States no missed doses of xarelto 20 mg daily   Discussed pt going to the ER if he develops worsening shortness of breath , weight gain, orthopnea or PND  Geroge Baseman. Jasani Dolney, [facility] [address] [place], [place] [address] [phone]

## 2019-08-25 NOTE — Patient Instructions (Signed)
Take Amiodarone 400mg  twice a day (2 of the 200mg  tabs twice a day) until we see you Wednesday

## 2019-08-25 NOTE — Telephone Encounter (Signed)
Patient's wife calling in reference to the patient getting his heart shocked back. She says it came out of rhythm and his HR is very fast. He has an appointment this Wednesday 08/27/19 in the afib clinic. They would like to know if he should come to this appointment or if he should be seen sooner.

## 2019-08-25 NOTE — Telephone Encounter (Signed)
appt made for today at 2pm

## 2019-08-26 ENCOUNTER — Ambulatory Visit: Payer: PRIVATE HEALTH INSURANCE | Admitting: Cardiology

## 2019-08-27 ENCOUNTER — Other Ambulatory Visit: Payer: Self-pay

## 2019-08-27 ENCOUNTER — Ambulatory Visit (HOSPITAL_COMMUNITY)
Admission: RE | Admit: 2019-08-27 | Discharge: 2019-08-27 | Disposition: A | Discharge status: Home / Self Care | Payer: PRIVATE HEALTH INSURANCE | Source: Ambulatory Visit | Attending: Nurse Practitioner | Admitting: Nurse Practitioner

## 2019-08-27 ENCOUNTER — Ambulatory Visit (HOSPITAL_COMMUNITY): Payer: PRIVATE HEALTH INSURANCE | Admitting: Nurse Practitioner

## 2019-08-27 DIAGNOSIS — I4891 Unspecified atrial fibrillation: Secondary | ICD-10-CM | POA: Diagnosis present

## 2019-08-27 DIAGNOSIS — Z79899 Other long term (current) drug therapy: Secondary | ICD-10-CM | POA: Diagnosis not present

## 2019-08-27 MED ORDER — AMIODARONE HCL 200 MG PO TABS: 200.0000 mg | ORAL_TABLET | Freq: Two times a day (BID) | ORAL | 3 refills | Status: DC

## 2019-08-27 NOTE — Patient Instructions (Signed)
Starting 08/28/2019 Decrease Amiodarone 200mg  1 tablet twice daily

## 2019-08-27 NOTE — Progress Notes (Signed)
In for repeat EKG after being started on amiodarone 200 mg x2 in am and pm for afib with RVR on last visit around 160 bpm. EKG shows today Afib with v rate of 115 bpm. QTc 453 ms. In am reduce amiodarone to 200 mg bid and I will see back in one week to discuss timing of cardioversion. BP 126/68.

## 2019-09-02 NOTE — Progress Notes (Signed)
Patient ID: Samuel Flynn, male   DOB: 1976-06-27, 43 y.o.   MRN: [id number]  Virtual Visit via Telephone Note  I connected with [name] on 09/03/19 at  4:10 PM EST by telephone and verified that I am speaking with the correct person using two identifiers.   I discussed the limitations, risks, security and privacy concerns of performing an evaluation and management service by telephone and the availability of in person appointments. I also discussed with the patient that there may be a patient responsible charge related to this service. The patient expressed understanding and agreed to proceed.   History of Present Illness: Seeing him today for hospital follow-up from 08/19/2019 where he was in A-fib, cardioverted then reverted back to afib.  He saw cardiology 12 21/2020 and they are following.  They have him on amiodarone for rate control until next cardioversion.  He says he feels as though he pulled a muscle in his back.  He is taking tylenol with some relief.  No new CP/SOB/palpitations.    Cardiology 08/25/2019/from A/P: Assessment and Plan: 1. Atrial fib/flutter with RVR Ablation last year 04/2018 Now with return of arrhythmia with successful cardioversion and ERAF after 2 days of cardioversion Has h/o being hard to rate control I don't think sending him to the ER  for another DCCV will be a good solution as I fear ERAF  Discussed  with pt starting back on amiodarone for rate control and  as a bridge for a redo ablation down the road   He is in agreement  He toleated amiodarone well in the past, stopped it after ablation as he was in SR  He will continue BB at current dose He will start amiodarone 400 mg bid until I see him back on Wednesday   2. CHA2DS2VASc score of 2 States no missed doses of xarelto 20 mg daily     Observations/Objective: NAD.  A&Ox3  Assessment and Plan: 1. Muscle spasm - methocarbamol (ROBAXIN) 500 MG tablet; Take 2 tablets (1,000 mg  total) by mouth every 8 (eight) hours as needed for muscle spasms.  Dispense: 90 tablet; Refill: 0  2. Paroxysmal atrial fibrillation (HCC) Continue amiodarone and f/up with cardiology 09/08/19 as planned.  Call 911 if sudden CP/dizziness/SOB  3. Encounter for examination following treatment at hospital Doing well   Follow Up Instructions: Assign new PCP in 6-8 weeks   I discussed the assessment and treatment plan with the patient. The patient was provided an opportunity to ask questions and all were answered. The patient agreed with the plan and demonstrated an understanding of the instructions.   The patient was advised to call back or seek an in-person evaluation if the symptoms worsen or if the condition fails to improve as anticipated.  I provided 11 minutes of non-face-to-face time during this encounter.   Freeman Caldron, PA-C

## 2019-09-03 ENCOUNTER — Ambulatory Visit (INDEPENDENT_AMBULATORY_CARE_PROVIDER_SITE_OTHER): Payer: PRIVATE HEALTH INSURANCE | Admitting: Physician Assistant

## 2019-09-03 ENCOUNTER — Ambulatory Visit (HOSPITAL_COMMUNITY): Payer: PRIVATE HEALTH INSURANCE | Admitting: Nurse Practitioner

## 2019-09-03 DIAGNOSIS — Z09 Encounter for follow-up examination after completed treatment for conditions other than malignant neoplasm: Secondary | ICD-10-CM

## 2019-09-03 DIAGNOSIS — I480 Paroxysmal atrial fibrillation: Secondary | ICD-10-CM

## 2019-09-03 DIAGNOSIS — M62838 Other muscle spasm: Secondary | ICD-10-CM | POA: Diagnosis not present

## 2019-09-03 MED ORDER — METHOCARBAMOL 500 MG PO TABS: 1000.0000 mg | ORAL_TABLET | Freq: Three times a day (TID) | ORAL | 0 refills | Status: DC | PRN

## 2019-09-08 ENCOUNTER — Encounter (HOSPITAL_COMMUNITY): Payer: Self-pay | Admitting: Nurse Practitioner

## 2019-09-08 ENCOUNTER — Ambulatory Visit (HOSPITAL_COMMUNITY)
Admission: RE | Admit: 2019-09-08 | Discharge: 2019-09-08 | Disposition: A | Discharge status: Home / Self Care | Payer: PRIVATE HEALTH INSURANCE | Source: Ambulatory Visit | Attending: Nurse Practitioner | Admitting: Nurse Practitioner

## 2019-09-08 ENCOUNTER — Ambulatory Visit: Payer: PRIVATE HEALTH INSURANCE

## 2019-09-08 ENCOUNTER — Other Ambulatory Visit: Payer: Self-pay

## 2019-09-08 VITALS — BP 116/70 | HR 49 | Ht 74.0 in | Wt 320.6 lb

## 2019-09-08 DIAGNOSIS — K219 Gastro-esophageal reflux disease without esophagitis: Secondary | ICD-10-CM | POA: Diagnosis not present

## 2019-09-08 DIAGNOSIS — Z7901 Long term (current) use of anticoagulants: Secondary | ICD-10-CM | POA: Diagnosis not present

## 2019-09-08 DIAGNOSIS — G4730 Sleep apnea, unspecified: Secondary | ICD-10-CM | POA: Insufficient documentation

## 2019-09-08 DIAGNOSIS — I480 Paroxysmal atrial fibrillation: Secondary | ICD-10-CM | POA: Diagnosis not present

## 2019-09-08 DIAGNOSIS — I10 Essential (primary) hypertension: Secondary | ICD-10-CM | POA: Diagnosis not present

## 2019-09-08 DIAGNOSIS — I484 Atypical atrial flutter: Secondary | ICD-10-CM

## 2019-09-08 DIAGNOSIS — Z823 Family history of stroke: Secondary | ICD-10-CM | POA: Diagnosis not present

## 2019-09-08 DIAGNOSIS — Z8249 Family history of ischemic heart disease and other diseases of the circulatory system: Secondary | ICD-10-CM | POA: Diagnosis not present

## 2019-09-08 DIAGNOSIS — Z79899 Other long term (current) drug therapy: Secondary | ICD-10-CM | POA: Insufficient documentation

## 2019-09-08 DIAGNOSIS — Z87891 Personal history of nicotine dependence: Secondary | ICD-10-CM | POA: Insufficient documentation

## 2019-09-08 DIAGNOSIS — I4892 Unspecified atrial flutter: Secondary | ICD-10-CM | POA: Diagnosis not present

## 2019-09-08 DIAGNOSIS — Z8711 Personal history of peptic ulcer disease: Secondary | ICD-10-CM | POA: Insufficient documentation

## 2019-09-08 DIAGNOSIS — I4891 Unspecified atrial fibrillation: Secondary | ICD-10-CM | POA: Diagnosis present

## 2019-09-08 DIAGNOSIS — Z6841 Body Mass Index (BMI) 40.0 and over, adult: Secondary | ICD-10-CM | POA: Diagnosis not present

## 2019-09-08 DIAGNOSIS — M109 Gout, unspecified: Secondary | ICD-10-CM | POA: Diagnosis not present

## 2019-09-08 DIAGNOSIS — D6869 Other thrombophilia: Secondary | ICD-10-CM

## 2019-09-08 MED ORDER — AMIODARONE HCL 200 MG PO TABS: 200.0000 mg | ORAL_TABLET | Freq: Every day | ORAL | 3 refills | Status: DC

## 2019-09-08 NOTE — Progress Notes (Addendum)
Primary Care Physician: Scot Jun, FNP Referring Physician: Dr. Michele Rockers is a 44 y.o. male with a h/o afib with RVR, s/p ablation [id number], diastolic heart failure, morbid obesity that is in afib clinic with afib with RVR. Pt had  successful cardioversion 08/19/19 but it only lasted 2 days. He noted HR's around 200 bpm last night. Today in the office he is running 160's bpm,  He is tolerating. He had issues with RVR  last August and was placed on amiodarone then as a bridge to ablation. Discussed with pt and he is  willing to do this again as it appears he may need a redo ablation. He is wearing cpap. No alcohol, or tobacco. He has gained around 30 lbs in the last year. CHA2DS2VASc score of 2. He is compliant with CPAP.  F/u in afib clinic, 09/08/19. He converted to SR last Wednesday. He had some dizziness when first converting. Otherwise feels improved.   Today, he denies symptoms of  chest pain,+for  shortness of breath, orthopnea, PND,chronic  lower extremity edema, dizziness, presyncope, syncope, or neurologic sequela. The patient is tolerating medications without difficulties and is otherwise without complaint today.   Past Medical History:  Diagnosis Date  . Atrial fibrillation with RVR ([place])   . Gastric ulcer   . Gout   . Hypertension   . Paroxysmal A-fib ([place])   . Sleep apnea    USES CPAP  . Wears glasses    Past Surgical History:  Procedure Laterality Date  . ATRIAL FIBRILLATION ABLATION N/A 04/26/2018   Procedure: ATRIAL FIBRILLATION ABLATION;  Surgeon: Constance Haw, MD;  Location: [place] [facility] LAB;  Service: Cardiovascular;  Laterality: N/A;  . CARDIOVERSION N/A 01/18/2018   Procedure: CARDIOVERSION;  Surgeon: Larey Dresser, MD;  Location: [facility] ENDOSCOPY;  Service: Cardiovascular;  Laterality: N/A;  . CARDIOVERSION N/A 08/19/2019   Procedure: CARDIOVERSION;  Surgeon: Thayer Headings, MD;  Location: [facility] ENDOSCOPY;  Service: Cardiovascular;   Laterality: N/A;  . ELBOW LIGAMENT RECONSTRUCTION Right 09/29/2014   Procedure: Merian Capron FRACTURE FIXATION, POSSIBLE LIGAMENT REPAIR;  Surgeon: Nita Sells, MD;  Location: [place];  Service: Orthopedics;  Laterality: Right;  Right elbow coranoid fracture fixation, possible ligament repair  . TEE WITHOUT CARDIOVERSION N/A 01/18/2018   Procedure: TRANSESOPHAGEAL ECHOCARDIOGRAM (TEE);  Surgeon: Larey Dresser, MD;  Location: [facility] ENDOSCOPY;  Service: Cardiovascular;  Laterality: N/A;  . WISDOM TOOTH EXTRACTION      Current Outpatient Medications  Medication Sig Dispense Refill  . allopurinol (ZYLOPRIM) 100 MG tablet Take 100 mg by mouth daily. For gout    . amiodarone (PACERONE) 200 MG tablet Take 1 tablet (200 mg total) by mouth daily. 30 tablet 3  . colchicine 0.6 MG tablet Take 0.6 mg by mouth daily as needed (gout flare).   1  . furosemide (LASIX) 40 MG tablet Take 1 tablet (40 mg total) by mouth 2 (two) times daily. 60 tablet 6  . ibuprofen (ADVIL) 200 MG tablet Take 800 mg by mouth every 8 (eight) hours as needed (pain).    . methocarbamol (ROBAXIN) 500 MG tablet Take 2 tablets (1,000 mg total) by mouth every 8 (eight) hours as needed for muscle spasms. 90 tablet 0  . metoprolol succinate (TOPROL-XL) 100 MG 24 hr tablet Take 1 tablet (100 mg total) by mouth daily. Take with or immediately following a meal. 90 tablet 3  . pantoprazole (PROTONIX) 40 MG tablet Take 1 tablet (  40 mg total) by mouth daily. 90 tablet 4  . XARELTO 20 MG TABS tablet Take 20 mg by mouth daily.     No current facility-administered medications for this encounter.    No Known Allergies  Social History   Socioeconomic History  . Marital status: Married    Spouse name: Not on file  . Number of children: 4  . Years of education: 10  . Highest education level: Not on file  Occupational History  . Occupation: Truck Education administrator: [employer]  Tobacco Use  . Smoking  status: Former Smoker    Types: Cigars    Quit date: 02/03/2016    Years since quitting: 3.5  . Smokeless tobacco: Never Used  Substance and Sexual Activity  . Alcohol use: Yes    Alcohol/week: 0.0 standard drinks    Comment: occ  . Drug use: No  . Sexual activity: Yes    Comment: 5 a day  Other Topics Concern  . Not on file  Social History Narrative  . Not on file   Social Determinants of Health   Financial Resource Strain:   . Difficulty of Paying Living Expenses: Not on file  Food Insecurity:   . Worried About Charity fundraiser in the Last Year: Not on file  . Ran Out of Food in the Last Year: Not on file  Transportation Needs:   . Lack of Transportation (Medical): Not on file  . Lack of Transportation (Non-Medical): Not on file  Physical Activity:   . Days of Exercise per Week: Not on file  . Minutes of Exercise per Session: Not on file  Stress:   . Feeling of Stress : Not on file  Social Connections:   . Frequency of Communication with Friends and Family: Not on file  . Frequency of Social Gatherings with Friends and Family: Not on file  . Attends Religious Services: Not on file  . Active Member of Clubs or Organizations: Not on file  . Attends Archivist Meetings: Not on file  . Marital Status: Not on file  Intimate Partner Violence:   . Fear of Current or Ex-Partner: Not on file  . Emotionally Abused: Not on file  . Physically Abused: Not on file  . Sexually Abused: Not on file    Family History  Problem Relation Age of Onset  . Hypertension Paternal Uncle   . Hypertension Maternal Grandmother   . Hypertension Paternal Uncle   . Hypertension Paternal Uncle   . Hypertension Paternal Uncle   . Hypertension Paternal Uncle   . Hypertension Mother   . Stroke Father   . Heart disease Father   . Heart attack Father   . Hypertension Father   . Colon cancer Neg Hx     ROS- All systems are reviewed and negative except as per the HPI  above  Physical Exam: Vitals:   09/08/19 1535  BP: 116/70  Pulse: (!) 49  Weight: (!) 145.4 kg  Height: 6\' 2"  (1.88 m)   Wt Readings from Last 3 Encounters:  09/08/19 (!) 145.4 kg  08/25/19 (!) 145.2 kg  08/19/19 (!) 138.3 kg    Labs: Lab Results  Component Value Date   NA 142 08/15/2019   K 4.4 08/15/2019   CL 103 08/15/2019   CO2 24 08/15/2019   GLUCOSE 85 08/15/2019   BUN 22 08/15/2019   CREATININE 1.84 (H) 08/15/2019   CALCIUM 8.9 08/15/2019   MG 1.9  07/13/2019   Lab Results  Component Value Date   INR 1.17 03/24/2018   Lab Results  Component Value Date   CHOL 211 (H) 01/16/2018   HDL 24 (L) 01/16/2018   LDLCALC 122 (H) 01/16/2018   TRIG 323 (H) 01/16/2018     GEN- The patient is well appearing, alert and oriented x 3 today.   Head- normocephalic, atraumatic Eyes-  Sclera clear, conjunctiva pink Ears- hearing intact Oropharynx- clear Neck- supple, no JVP Lymph- no cervical lymphadenopathy Lungs- Clear to ausculation bilaterally, normal work of breathing Heart- regular rate and rhythm, no murmurs, rubs or gallops, PMI not laterally displaced GI- soft, NT, ND, + BS Extremities- no clubbing, cyanosis, or edema MS- no significant deformity or atrophy Skin- no rash or lesion Psych- euthymic mood, full affect Neuro- strength and sensation are intact  EKG- Sinus rhythm at 49 bpm, PR int 144 ms, qrs int 96 ms, qtc 429 ms Epic records reviewed   Assessment and Plan: 1. Atrial fib/flutter with RVR Ablation last year 04/2018 Return of arrhythmia with successful cardioversion 08/18/20 and ERAF after 2 days of cardioversion Has h/o being hard to rate control I didn't  think sending him to the ER  for another DCCV will be a good solution as I fear ERAF  Discussed  with pt starting back on amiodarone for rate control and  as a bridge for a redo ablation down the road   He was in agreement  He has taken 200 mg bid for 2 weeks and converted to SR  a few days  ago He will continue BB at current dose He took  amiodarone 400 mg bid for a few days and has been on 200 mg bid for over a week  He will continue this dose thru the end of this week and then go to 200 mg daily   2. CHA2DS2VASc score of 2 States no missed doses of xarelto 20 mg daily   I will request f/u with Dr. Curt Bears in 1-2 weeks to discuss redo ablation and at some point stopping amiodarone  Butch Penny C. Thao Vanover, [facility] [address] [place], [place] [address] [phone]

## 2019-09-08 NOTE — Patient Instructions (Signed)
Continue Amiodarone 200mg  twice daily this week  Next week Amiodarone 200mg  daily

## 2019-09-21 ENCOUNTER — Other Ambulatory Visit: Payer: Self-pay | Admitting: Cardiovascular Disease

## 2019-09-29 ENCOUNTER — Ambulatory Visit: Payer: PRIVATE HEALTH INSURANCE | Admitting: Cardiology

## 2019-09-29 ENCOUNTER — Other Ambulatory Visit: Payer: Self-pay

## 2019-09-29 ENCOUNTER — Encounter: Payer: Self-pay | Admitting: Cardiology

## 2019-09-29 VITALS — BP 132/80 | HR 50 | Ht 74.0 in | Wt 325.2 lb

## 2019-09-29 DIAGNOSIS — I4819 Other persistent atrial fibrillation: Secondary | ICD-10-CM

## 2019-09-29 NOTE — Progress Notes (Signed)
Electrophysiology Office Note   Date:  09/29/2019   ID:  Samuel Flynn, DOB 03/06/1976, MRN [id number]  PCP:  Scot Jun, FNP  Cardiologist:  Oval Linsey Primary Electrophysiologist:  Lakeita Panther Meredith Leeds, MD    Chief Complaint: AF   History of Present Illness: Samuel Flynn is a 44 y.o. male who is being seen today for the evaluation of AF at the request of Scot Jun, FNP. Presenting today for electrophysiology evaluation.  He has a history of hypertension, obesity, OSA on CPAP, CKD, paroxysmal atrial fibrillation.  He is status post AF ablation 04/26/2018.  Unfortunately, he is continued to have atrial fibrillation throughout the year this year.  He has weakness, fatigue, and shortness of breath.  He has had multiple cardioversions.  He is currently on amiodarone.  Today, he denies symptoms of palpitations, chest pain, shortness of breath, orthopnea, PND, lower extremity edema, claudication, dizziness, presyncope, syncope, bleeding, or neurologic sequela. The patient is tolerating medications without difficulties.    Past Medical History:  Diagnosis Date  . Atrial fibrillation with RVR ([place])   . Gastric ulcer   . Gout   . Hypertension   . Paroxysmal A-fib ([place])   . Sleep apnea    USES CPAP  . Wears glasses    Past Surgical History:  Procedure Laterality Date  . ATRIAL FIBRILLATION ABLATION N/A 04/26/2018   Procedure: ATRIAL FIBRILLATION ABLATION;  Surgeon: Constance Haw, MD;  Location: [place] [facility];  Service: Cardiovascular;  Laterality: N/A;  . CARDIOVERSION N/A 01/18/2018   Procedure: CARDIOVERSION;  Surgeon: Larey Dresser, MD;  Location: [facility] ENDOSCOPY;  Service: Cardiovascular;  Laterality: N/A;  . CARDIOVERSION N/A 08/19/2019   Procedure: CARDIOVERSION;  Surgeon: Thayer Headings, MD;  Location: [facility] ENDOSCOPY;  Service: Cardiovascular;  Laterality: N/A;  . ELBOW LIGAMENT RECONSTRUCTION Right 09/29/2014   Procedure: Merian Capron FRACTURE  FIXATION, POSSIBLE LIGAMENT REPAIR;  Surgeon: Nita Sells, MD;  Location: [place];  Service: Orthopedics;  Laterality: Right;  Right elbow coranoid fracture fixation, possible ligament repair  . TEE WITHOUT CARDIOVERSION N/A 01/18/2018   Procedure: TRANSESOPHAGEAL ECHOCARDIOGRAM (TEE);  Surgeon: Larey Dresser, MD;  Location: [facility] ENDOSCOPY;  Service: Cardiovascular;  Laterality: N/A;  . WISDOM TOOTH EXTRACTION       Current Outpatient Medications  Medication Sig Dispense Refill  . allopurinol (ZYLOPRIM) 100 MG tablet Take 100 mg by mouth daily. For gout    . amiodarone (PACERONE) 200 MG tablet Take 1 tablet (200 mg total) by mouth daily. 30 tablet 3  . colchicine 0.6 MG tablet Take 0.6 mg by mouth daily as needed (gout flare).   1  . furosemide (LASIX) 40 MG tablet Take 1 tablet (40 mg total) by mouth 2 (two) times daily. 60 tablet 6  . ibuprofen (ADVIL) 200 MG tablet Take 800 mg by mouth every 8 (eight) hours as needed (pain).    . methocarbamol (ROBAXIN) 500 MG tablet Take 2 tablets (1,000 mg total) by mouth every 8 (eight) hours as needed for muscle spasms. 90 tablet 0  . metoprolol succinate (TOPROL-XL) 100 MG 24 hr tablet Take 1 tablet (100 mg total) by mouth daily. Take with or immediately following a meal. 90 tablet 3  . pantoprazole (PROTONIX) 40 MG tablet Take 1 tablet (40 mg total) by mouth daily. 90 tablet 4  . XARELTO 20 MG TABS tablet TAKE 1 TABLET (20 MG TOTAL) BY MOUTH DAILY WITH SUPPER. 90 tablet 1   No  current facility-administered medications for this visit.    Allergies:   Patient has no known allergies.   Social History:  The patient  reports that he quit smoking about 3 years ago. His smoking use included cigars. He has never used smokeless tobacco. He reports current alcohol use. He reports that he does not use drugs.   Family History:  The patient's family history includes Heart attack in his father; Heart disease in his father;  Hypertension in his father, maternal grandmother, mother, paternal uncle, paternal uncle, paternal uncle, paternal uncle, and paternal uncle; Stroke in his father.    ROS:  Please see the history of present illness.   Otherwise, review of systems is positive for none.   All other systems are reviewed and negative.    PHYSICAL EXAM: VS:  BP 132/80   Pulse (!) 50   Ht 6\' 2"  (1.88 m)   Wt (!) 325 lb 3.2 oz (147.5 kg)   SpO2 98%   BMI 41.75 kg/m  , BMI Body mass index is 41.75 kg/m. GEN: Well nourished, well developed, in no acute distress  HEENT: normal  Neck: no JVD, carotid bruits, or masses Cardiac: RRR; no murmurs, rubs, or gallops,no edema  Respiratory:  clear to auscultation bilaterally, normal work of breathing GI: soft, nontender, nondistended, + BS MS: no deformity or atrophy  Skin: warm and dry Neuro:  Strength and sensation are intact Psych: euthymic mood, full affect  EKG:  EKG is not ordered today. Personal review of the ekg ordered 09/08/19 shows sinus rhythm, rate 49  Recent Labs: 07/13/2019: B Natriuretic Peptide 234.6; Magnesium 1.9 07/14/2019: TSH 1.950 08/11/2019: Hemoglobin 13.6; Platelets 224 08/15/2019: BUN 22; Creatinine, Ser 1.84; Potassium 4.4; Sodium 142    Lipid Panel     Component Value Date/Time   CHOL 211 (H) 01/16/2018 0916   TRIG 323 (H) 01/16/2018 0916   HDL 24 (L) 01/16/2018 0916   CHOLHDL 8.8 01/16/2018 0916   VLDL 65 (H) 01/16/2018 0916   LDLCALC 122 (H) 01/16/2018 0916     Wt Readings from Last 3 Encounters:  09/29/19 (!) 325 lb 3.2 oz (147.5 kg)  09/08/19 (!) 320 lb 9.6 oz (145.4 kg)  08/25/19 (!) 320 lb (145.2 kg)      Other studies Reviewed: Additional studies/ records that were reviewed today include: TTE 07/14/19  Review of the above records today demonstrates:   1. Left ventricular ejection fraction, by visual estimation, is 55 to 60%. The left ventricle has normal function. There is mildly increased left ventricular  hypertrophy. Appears normal systolic function, though anterior wall is not well visualized  2. Left ventricular diastolic parameters are indeterminate.  3. Global right ventricle has normal systolic function.The right ventricular size is normal. No increase in right ventricular wall thickness.  4. Left atrial size was mildly dilated.  5. Right atrial size was normal.  6. The mitral valve is normal in structure. Mild mitral valve regurgitation.  7. The tricuspid valve is normal in structure. Tricuspid valve regurgitation is not demonstrated.  8. The aortic valve is tricuspid. Aortic valve regurgitation is not visualized. No evidence of aortic valve sclerosis or stenosis.  9. The pulmonic valve was not well visualized. Pulmonic valve regurgitation is not visualized. 10. The inferior vena cava is dilated in size with >50% respiratory variability, suggesting right atrial pressure of 8 mmHg. 11. TR signal is inadequate for assessing pulmonary artery systolic pressure.   ASSESSMENT AND PLAN:  1.  Persistent atrial fibrillation: Status  post ablation 04/26/2018.  CHA2DS2-VASc of 1, thus not anticoagulated.  Currently on amiodarone.  Unfortunately he has had further episodes of atrial fibrillation.,  Samuel Flynn plan for repeat ablation.  Risks and benefits were discussed include bleeding, tamponade, stroke, damage surrounding organs.  He understands these risks and is agreed to the procedure.  2.  Obstructive sleep apnea: CPAP compliance encouraged.    Current medicines are reviewed at length with the patient today.   The patient does not have concerns regarding his medicines.  The following changes were made today:  none  Labs/ tests ordered today include:  Orders Placed This Encounter  Procedures  . CT CARDIAC MORPH/PULM VEIN W/CM&W/O CA SCORE  . CT CORONARY FRACTIONAL FLOW RESERVE DATA PREP  . CT CORONARY FRACTIONAL FLOW RESERVE FLUID ANALYSIS     Disposition:   FU with Samuel Flynn 3  months  Signed, Samuel Rockholt Meredith Leeds, MD  09/29/2019 4:11 PM     Samuel Flynn [place] [address] [phone] (office) [phone] (fax)

## 2019-09-29 NOTE — Patient Instructions (Addendum)
Medication Instructions:  Your physician recommends that you continue on your current medications as directed. Please refer to the Current Medication list given to you today.  *If you need a refill on your cardiac medications before your next appointment, please call your pharmacy.  Labwork: You will get lab work within 30 days of your procedure:  BMP & CBC You are scheduled for 10/24/19 If you have labs (blood work) drawn today and your tests are completely normal, you will receive your results only by:  [place] (if you have MyChart) OR  A paper copy in the mail If you have any lab test that is abnormal or we need to change your treatment, we will call you to review the results.  Testing/Procedures: Your physician has requested that you have cardiac CT within 7 days prior to your ablation. Cardiac computed tomography (CT) is a painless test that uses an x-ray machine to take clear, detailed pictures of your heart. For further information please visit [email or web address]. Please follow instruction below located under special instructions. You will get a call from our office to schedule the date for this test.  Your physician has recommended that you have an ablation. Catheter ablation is a medical procedure used to treat some cardiac arrhythmias (irregular heartbeats). During catheter ablation, a long, thin, flexible tube is put into a blood vessel in your groin (upper thigh), or neck. This tube is called an ablation catheter. It is then guided to your heart through the blood vessel. Radio frequency waves destroy small areas of heart tissue where abnormal heartbeats may cause an arrhythmia to start. Please see the instructions below located under special instructions  Follow-Up: Your physician recommends that you schedule a follow-up appointment in: 4 weeks, after your procedure on 11/07/19, with Roderic Palau NP in the AFib clinic.  Your physician recommends that you schedule a follow  up appointment in: 3 months, after your procedure on 11/07/19, with Dr. Curt Bears.   Thank you for choosing [facility]!! Trinidad Curet, RN [phone]   Any Other Special Instructions Will Be Listed Below    CT INSTRUCTIONS Your cardiac CT will be scheduled at:   [facility] [address] [place], [place] [address] [phone]  Please arrive at the [facility] main entrance of [facility] at ____________. Please arrive 30-45 minutes prior to test start time. Proceed to the [facility] Radiology Department (first floor) to check-in and test prep.  Please follow these instructions carefully (unless otherwise directed):  Hold all erectile dysfunction medications at least 48 hours prior to test.  On the Night Before the Test: . Be sure to Drink plenty of water. . Do not consume any caffeinated/decaffeinated beverages or chocolate 12 hours prior to your test. . Do not take any antihistamines 12 hours prior to your test.  On the Day of the Test: . Drink plenty of water. Do not drink any water within one hour of the test. . Do not eat any food 4 hours prior to the test. . You may take your regular medications prior to the test.  . Take your Toprol two hours prior to test. . HOLD Furosemide/Hydrochlorothiazide morning of the test.  After the Test: . Drink plenty of water. . After receiving IV contrast, you may experience a mild flushed feeling. This is normal. . On occasion, you may experience a mild rash up to 24 hours after the test. This is not dangerous. If this occurs, you can take Benadryl 25  mg and increase your fluid intake. . If you experience trouble breathing, this can be serious. If it is severe call 911 IMMEDIATELY. If it is mild, please call our office.   Please contact the cardiac imaging nurse navigator should you have any questions/concerns Marchia Bond, RN Navigator Cardiac Imaging [facility] [phone]  Office  [phone] Cell       Electrophysiology/Ablation Procedure Instructions   You are scheduled for a(n) AFib ablation on 11/07/19 with Dr. Allegra Lai.   1.   Pre procedure testing-             A.  LAB WORK --- On 10/24/19 @ 4:00 pm  for your pre procedure blood work.  You DO NOT have to be fasting for this blood work               B. COVID TEST-- On 11/04/19 @ 2:30 pm - You will go to [facility] for your Covid testing.   This is a drive thru test site.  There will be multiple testing areas.  Be sure to share with the first checkpoint that you are there for pre-procedure/surgery testing. This will put you into the right (yellow) lane that leads to the PAT testing team. Stay in your car and the nurse team will come to your car to test you.  After you are tested please go home and self quarantine until the day of your procedure.     2. On the day of your procedure 11/07/2019 you will go to [facility] [phone]) at 5:30 am.  Dennis Bast will go to the main entrance A [employer]) and enter where the [employer] are.  Your driver will drop you off and you will head down the hallway to ADMITTING.  You may have one support person come in to  3. the hospital with you.  They will be asked to wait in the waiting room.   3.   Do not eat or drink after midnight prior to your procedure.   4.   Do NOT take any medications the morning of your procedure.   5.  Plan for an overnight stay.  If you use your phone frequently bring your phone charger.   6. You will follow up with the AFIB clinic 4 weeks after your procedure.  You will follow up with Dr. Curt Bears  3 months after your procedure.  These appointments will be made for you.   * If you have ANY questions please call the office [phone] and ask for Ercie Eliasen RN or send me a MyChart message   * Occasionally, EP Studies and ablations can become lengthy.  Please make your family aware of  this before your procedure starts.  Average time ranges from 2-8 hours for EP studies/ablations.  Your physician will call your family after the procedure with the results.

## 2019-10-08 ENCOUNTER — Other Ambulatory Visit: Payer: Self-pay | Admitting: Cardiovascular Disease

## 2019-10-08 ENCOUNTER — Telehealth: Payer: Self-pay | Admitting: Cardiovascular Disease

## 2019-10-08 MED ORDER — RIVAROXABAN 20 MG PO TABS: ORAL_TABLET | ORAL | 1 refills | Status: DC

## 2019-10-08 NOTE — Telephone Encounter (Signed)
Patient's wife is calling back in regards to the refill for Xarelto stating the pharmacy stated they need prior authorization from insurance. Please advise.

## 2019-10-08 NOTE — Telephone Encounter (Signed)
Returned call to patient's wife.She stated husband needed a PA on Xarelto.Stated he has enough to last to Friday 2/5.Advised I will send message to Dr.[employer]'s nurse.

## 2019-10-08 NOTE — Telephone Encounter (Signed)
Patient followed by Dr Curt Bears for Afib who refilled Xarelto today  Will forward to Wonda Horner LPN for PA

## 2019-10-08 NOTE — Telephone Encounter (Signed)
New message    *STAT* If patient is at the pharmacy, call can be transferred to refill team.   1. Which medications need to be refilled? (please list name of each medication and dose if known)XARELTO 20 MG TABS tablet  2. Which pharmacy/location (including street and city if local pharmacy) is medication to be sent to?[id number] [place] [place] - [address] 3. Do they need a 30 day or 90 day supply?[place]

## 2019-10-09 ENCOUNTER — Telehealth: Payer: Self-pay | Admitting: Cardiology

## 2019-10-09 NOTE — Telephone Encounter (Signed)
**Note De-Identified Glendale Youngblood Obfuscation** I have started a Xarelto PA through covermymeds: Key: [id number]

## 2019-10-09 NOTE — Telephone Encounter (Signed)
Looks like Dr. Oval Linsey ordered Xarelto

## 2019-10-09 NOTE — Telephone Encounter (Signed)
See 2/3 telephone encounter 

## 2019-10-09 NOTE — Telephone Encounter (Signed)
Pt c/o medication issue:  1. Name of Medication: rivaroxaban (XARELTO) 20 MG TABS tablet  2. How are you currently taking this medication (dosage and times per day)? Has not started medication  3. Are you having a reaction (difficulty breathing--STAT)? no  4. What is your medication issue? Patient's wife states the medication is $400 and they would like to know if there is a cheaper medication he can take.

## 2019-10-10 ENCOUNTER — Other Ambulatory Visit: Payer: Self-pay

## 2019-10-10 MED ORDER — RIVAROXABAN 20 MG PO TABS: ORAL_TABLET | ORAL | 1 refills | Status: DC

## 2019-10-10 NOTE — Telephone Encounter (Signed)
**Note De-Identified Lyndell Gillyard Obfuscation** Fax received from [employer] stating that a PA is not needed for the pts Xarelto. [facility] and was advised that the PA is still pending on their end.  I called [facility] at [phone] and s/w Callie who advised me that per the pts ins plan his Xarelto will need to be filled by [facility] which is a specialty pharmacy and that the pt must call them at [phone] to set up his account with them.  I have called the only phone number we have listed for him in his chartbut got no ans and his VM has not been set up yet. I then [facility] and made them aware of above so that if the pt contacts them they can ask him to call us or [email or web address].

## 2019-10-10 NOTE — Telephone Encounter (Signed)
**Note De-Identified Bode Pieper Obfuscation** Fax received from [employer] stating that a PA is not needed for the pts Xarelto. [facility] then [facility] and was advised that the PA is still pending on their end.  I called [facility] at [phone] and s/w Callie who advised me that per the pts ins plan his Xarelto will need to be filled by [place] which is a specialty pharmacy and that the pt must call them at [phone] to set up his account with them.  I have called the only phone number we have listed for him in his chartbut got no ans and his VM has not been set up yet. I then [facility] and made them aware of above so that if the pt contacts them they can ask him to call us or [email or web address].

## 2019-10-14 ENCOUNTER — Telehealth: Payer: Self-pay | Admitting: Cardiology

## 2019-10-14 NOTE — Telephone Encounter (Signed)
**Note De-Identified Bennye Nix Obfuscation** The pt is advised that I attempted to reach him several times but never got an answer and his VM his full. 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  I did provide him their phone number. Please see phone note from 10/08/2019 for details.

## 2019-10-14 NOTE — Telephone Encounter (Addendum)
**Note De-Identified Linday Rhodes Obfuscation** The pt is advised that I attempted to reach him several times but never got an answer and his VM his full. 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  I did provide him their phone number. Please see phone note from 10/08/2019 for details.

## 2019-10-14 NOTE — Telephone Encounter (Signed)
have Chemical engineer and his wife on the line and they have questions about the Xarelto  Please call

## 2019-10-14 NOTE — Telephone Encounter (Signed)
Samuel Hough, LPN, I saw that you tried to contact pt concerning his Xarelto. Pt is calling requesting samples or a change in medication. Can you please advise on this matter? Thank you

## 2019-10-14 NOTE — Telephone Encounter (Signed)
New Message  Pt informed me that they are taking Xarelto and would like to get samples because its too expensive and wants to see if there is an alternative that's cheaper.

## 2019-10-21 ENCOUNTER — Other Ambulatory Visit (HOSPITAL_COMMUNITY): Payer: Self-pay | Admitting: Nurse Practitioner

## 2019-10-24 ENCOUNTER — Other Ambulatory Visit: Payer: PRIVATE HEALTH INSURANCE

## 2019-10-28 ENCOUNTER — Other Ambulatory Visit: Payer: Self-pay

## 2019-10-28 ENCOUNTER — Telehealth (INDEPENDENT_AMBULATORY_CARE_PROVIDER_SITE_OTHER): Payer: PRIVATE HEALTH INSURANCE | Admitting: Cardiology

## 2019-10-28 DIAGNOSIS — I4819 Other persistent atrial fibrillation: Secondary | ICD-10-CM | POA: Diagnosis not present

## 2019-10-28 DIAGNOSIS — G4733 Obstructive sleep apnea (adult) (pediatric): Secondary | ICD-10-CM | POA: Diagnosis not present

## 2019-10-28 DIAGNOSIS — I4891 Unspecified atrial fibrillation: Secondary | ICD-10-CM

## 2019-10-28 NOTE — Progress Notes (Signed)
Electrophysiology TeleHealth Note   Due to national recommendations of social distancing due to COVID 19, an audio/video telehealth visit is felt to be most appropriate for this patient at this time.  See Epic message for the patient's consent to telehealth for [facility].   Date:  10/28/2019   ID:  Samuel Flynn, DOB 1976-07-05, MRN [id number]  Location: patient's home  Provider location: [address] [place]  Evaluation Performed: Follow-up visit  PCP:  Scot Jun, FNP  Cardiologist:  Skeet Latch, MD  Electrophysiologist:  Dr Curt Bears  Chief Complaint:  AF  History of Present Illness:    Samuel Flynn is a 44 y.o. male who presents via audio/video conferencing for a telehealth visit today.  Since last being seen in our clinic, the patient reports doing very well.  Today, he denies symptoms of palpitations, chest pain, shortness of breath,  lower extremity edema, dizziness, presyncope, or syncope.  The patient is otherwise without complaint today.  The patient denies symptoms of fevers, chills, cough, or new SOB worrisome for COVID 19.  He has a history of hypertension, obesity, OSA on CPAP, CKD, and paroxysmal atrial fibrillation.  He is status post AF ablation 04/26/2018.  Unfortunately he has had more frequent episodes of atrial fibrillation and is planned for repeat ablation 11/07/2019.  Today, denies symptoms of palpitations, chest pain, shortness of breath, orthopnea, PND, lower extremity edema, claudication, dizziness, presyncope, syncope, bleeding, or neurologic sequela. The patient is tolerating medications without difficulties.    Past Medical History:  Diagnosis Date  . Atrial fibrillation with RVR ([place])   . Gastric ulcer   . Gout   . Hypertension   . Paroxysmal A-fib ([place])   . Sleep apnea    USES CPAP  . Wears glasses     Past Surgical History:  Procedure Laterality Date  . ATRIAL FIBRILLATION ABLATION N/A 04/26/2018   Procedure: ATRIAL FIBRILLATION ABLATION;  Surgeon: Constance Haw, MD;  Location: [place] [facility];  Service: Cardiovascular;  Laterality: N/A;  . CARDIOVERSION N/A 01/18/2018   Procedure: CARDIOVERSION;  Surgeon: Larey Dresser, MD;  Location: [facility] ENDOSCOPY;  Service: Cardiovascular;  Laterality: N/A;  . CARDIOVERSION N/A 08/19/2019   Procedure: CARDIOVERSION;  Surgeon: Thayer Headings, MD;  Location: [facility] ENDOSCOPY;  Service: Cardiovascular;  Laterality: N/A;  . ELBOW LIGAMENT RECONSTRUCTION Right 09/29/2014   Procedure: Merian Capron FRACTURE FIXATION, POSSIBLE LIGAMENT REPAIR;  Surgeon: Nita Sells, MD;  Location: [place];  Service: Orthopedics;  Laterality: Right;  Right elbow coranoid fracture fixation, possible ligament repair  . TEE WITHOUT CARDIOVERSION N/A 01/18/2018   Procedure: TRANSESOPHAGEAL ECHOCARDIOGRAM (TEE);  Surgeon: Larey Dresser, MD;  Location: [facility] ENDOSCOPY;  Service: Cardiovascular;  Laterality: N/A;  . WISDOM TOOTH EXTRACTION      Current Outpatient Medications  Medication Sig Dispense Refill  . allopurinol (ZYLOPRIM) 100 MG tablet Take 100 mg by mouth daily. For gout    . amiodarone (PACERONE) 200 MG tablet Take 1 tablet (200 mg total) by mouth daily. 30 tablet 11  . colchicine 0.6 MG tablet Take 0.6 mg by mouth daily as needed (gout flare).   1  . furosemide (LASIX) 40 MG tablet Take 1 tablet (40 mg total) by mouth 2 (two) times daily. 60 tablet 6  . ibuprofen (ADVIL) 200 MG tablet Take 800 mg by mouth every 8 (eight) hours as needed (pain).    . methocarbamol (ROBAXIN) 500 MG tablet Take 2  tablets (1,000 mg total) by mouth every 8 (eight) hours as needed for muscle spasms. 90 tablet 0  . metoprolol succinate (TOPROL-XL) 100 MG 24 hr tablet Take 1 tablet (100 mg total) by mouth daily. Take with or immediately following a meal. 90 tablet 3  . pantoprazole (PROTONIX) 40 MG tablet Take 1 tablet (40 mg total) by mouth daily. 90 tablet 4  .  rivaroxaban (XARELTO) 20 MG TABS tablet TAKE 1 TABLET (20 MG TOTAL) BY MOUTH DAILY WITH SUPPER. 90 tablet 1   No current facility-administered medications for this visit.    Allergies:   Patient has no known allergies.   Social History:  The patient  reports that he quit smoking about 3 years ago. His smoking use included cigars. He has never used smokeless tobacco. He reports current alcohol use. He reports that he does not use drugs.   Family History:  The patient's  family history includes Heart attack in his father; Heart disease in his father; Hypertension in his father, maternal grandmother, mother, paternal uncle, paternal uncle, paternal uncle, paternal uncle, and paternal uncle; Stroke in his father.   ROS:  Please see the history of present illness.   All other systems are personally reviewed and negative.    Exam:    Vital Signs:  There were no vitals taken for this visit.  no acute distress, no shortness of breath.  Labs/Other Tests and Data Reviewed:    Recent Labs: 07/13/2019: B Natriuretic Peptide 234.6; Magnesium 1.9 07/14/2019: TSH 1.950 08/11/2019: Hemoglobin 13.6; Platelets 224 08/15/2019: BUN 22; Creatinine, Ser 1.84; Potassium 4.4; Sodium 142   Wt Readings from Last 3 Encounters:  09/29/19 (!) 325 lb 3.2 oz (147.5 kg)  09/08/19 (!) 320 lb 9.6 oz (145.4 kg)  08/25/19 (!) 320 lb (145.2 kg)     Other studies personally reviewed: Additional studies/ records that were reviewed today include: ECG 09/08/2019 personally reviewed Review of the above records today demonstrates: Sinus rhythm  ASSESSMENT & PLAN:    1.  Persistent atrial fibrillation: Status post ablation in 2019.  CHA2DS2-VASc of 1.  On amiodarone.  Has had further episodes of atrial fibrillation and thus we Baelynn Schmuhl plan for repeat ablation.  He has a CT scan scheduled.  Ablation is scheduled for 11/07/2019.  2.  OSA: CPAP compliance encouraged.   COVID 19 screen The patient denies symptoms of COVID 19 at  this time.  The importance of social distancing was discussed today.  Follow-up: 3 months  Current medicines are reviewed at length with the patient today.   The patient does not have concerns regarding his medicines.  The following changes were made today:  none  Labs/ tests ordered today include:  No orders of the defined types were placed in this encounter.    Patient Risk:  after full review of this patients clinical status, I feel that they are at moderate risk at this time.  Today, I have spent 9 minutes with the patient with telehealth technology discussing atrial fibrillation.    Signed, Anora Schwenke Meredith Leeds, MD  10/28/2019 4:53 PM     [address] [address] [phone] (office) [phone] (fax)

## 2019-11-03 ENCOUNTER — Telehealth (HOSPITAL_COMMUNITY): Payer: Self-pay | Admitting: Emergency Medicine

## 2019-11-03 ENCOUNTER — Encounter (HOSPITAL_COMMUNITY): Payer: Self-pay

## 2019-11-03 NOTE — Telephone Encounter (Signed)
Reaching out to patient to offer assistance regarding upcoming cardiac imaging study; pt verbalizes understanding of appt date/time, parking situation and where to check in, pre-test NPO status and medications ordered, and verified current allergies; name and call back number provided for further questions should they arise Kenshawn Maciolek RN Navigator Cardiac Imaging [facility] [phone] office [phone] cell 

## 2019-11-04 ENCOUNTER — Other Ambulatory Visit: Payer: Self-pay

## 2019-11-04 ENCOUNTER — Ambulatory Visit (HOSPITAL_COMMUNITY)
Admission: RE | Admit: 2019-11-04 | Discharge: 2019-11-04 | Disposition: A | Discharge status: Home / Self Care | Payer: PRIVATE HEALTH INSURANCE | Source: Ambulatory Visit | Attending: Cardiology | Admitting: Cardiology

## 2019-11-04 ENCOUNTER — Other Ambulatory Visit (HOSPITAL_COMMUNITY)
Admission: RE | Admit: 2019-11-04 | Discharge: 2019-11-04 | Disposition: A | Discharge status: Home / Self Care | Payer: PRIVATE HEALTH INSURANCE | Source: Ambulatory Visit | Attending: Cardiology | Admitting: Cardiology

## 2019-11-04 DIAGNOSIS — I517 Cardiomegaly: Secondary | ICD-10-CM | POA: Insufficient documentation

## 2019-11-04 DIAGNOSIS — I4819 Other persistent atrial fibrillation: Secondary | ICD-10-CM | POA: Diagnosis not present

## 2019-11-04 DIAGNOSIS — Z01812 Encounter for preprocedural laboratory examination: Secondary | ICD-10-CM | POA: Diagnosis present

## 2019-11-04 DIAGNOSIS — Z20822 Contact with and (suspected) exposure to covid-19: Secondary | ICD-10-CM | POA: Diagnosis not present

## 2019-11-04 LAB — SARS CORONAVIRUS 2 (TAT 6-24 HRS): SARS Coronavirus 2: NEGATIVE

## 2019-11-04 MED ORDER — IOHEXOL 350 MG/ML SOLN
100.0000 mL | Freq: Once | INTRAVENOUS | Status: AC | PRN
  Administered 2019-11-04: 10:00:00 100 mL via INTRAVENOUS

## 2019-11-05 ENCOUNTER — Telehealth: Payer: Self-pay | Admitting: Cardiology

## 2019-11-05 NOTE — Telephone Encounter (Signed)
Spoke to patient advised after reviewing chart unable to know who called him. I will send message to Dr.Camnitz's RN.

## 2019-11-05 NOTE — Telephone Encounter (Signed)
New Message   Patient is returning a missed call. Please give patient a call back.

## 2019-11-06 NOTE — Progress Notes (Signed)
Instructed patient on the following items: Arrival time 0530 Nothing to eat or drink after midnight No meds AM of procedure Responsible person to drive you home and stay with you for 24 hrs  Have you missed any doses of anti-coagulant Xarelto- has not missed any doses

## 2019-11-07 ENCOUNTER — Encounter (HOSPITAL_COMMUNITY)
Admission: RE | Disposition: A | Discharge status: Home / Self Care | Payer: Self-pay | Source: Home / Self Care | Attending: Cardiology

## 2019-11-07 ENCOUNTER — Ambulatory Visit (HOSPITAL_COMMUNITY): Payer: PRIVATE HEALTH INSURANCE | Admitting: Anesthesiology

## 2019-11-07 ENCOUNTER — Ambulatory Visit (HOSPITAL_COMMUNITY)
Admission: RE | Admit: 2019-11-07 | Discharge: 2019-11-07 | Disposition: A | Discharge status: Home / Self Care | Payer: PRIVATE HEALTH INSURANCE | Attending: Cardiology | Admitting: Cardiology

## 2019-11-07 ENCOUNTER — Other Ambulatory Visit: Payer: Self-pay

## 2019-11-07 DIAGNOSIS — G4733 Obstructive sleep apnea (adult) (pediatric): Secondary | ICD-10-CM | POA: Insufficient documentation

## 2019-11-07 DIAGNOSIS — Z7901 Long term (current) use of anticoagulants: Secondary | ICD-10-CM | POA: Insufficient documentation

## 2019-11-07 DIAGNOSIS — M109 Gout, unspecified: Secondary | ICD-10-CM | POA: Diagnosis not present

## 2019-11-07 DIAGNOSIS — I129 Hypertensive chronic kidney disease with stage 1 through stage 4 chronic kidney disease, or unspecified chronic kidney disease: Secondary | ICD-10-CM | POA: Insufficient documentation

## 2019-11-07 DIAGNOSIS — I480 Paroxysmal atrial fibrillation: Secondary | ICD-10-CM | POA: Diagnosis not present

## 2019-11-07 DIAGNOSIS — Z79899 Other long term (current) drug therapy: Secondary | ICD-10-CM | POA: Insufficient documentation

## 2019-11-07 DIAGNOSIS — I4819 Other persistent atrial fibrillation: Secondary | ICD-10-CM | POA: Diagnosis present

## 2019-11-07 DIAGNOSIS — N189 Chronic kidney disease, unspecified: Secondary | ICD-10-CM | POA: Insufficient documentation

## 2019-11-07 DIAGNOSIS — Z87891 Personal history of nicotine dependence: Secondary | ICD-10-CM | POA: Insufficient documentation

## 2019-11-07 HISTORY — PX: ATRIAL FIBRILLATION ABLATION: EP1191

## 2019-11-07 LAB — CBC
HCT: 45.3 % (ref 39.0–52.0)
Hemoglobin: 14.8 g/dL (ref 13.0–17.0)
MCH: 27.7 pg (ref 26.0–34.0)
MCHC: 32.7 g/dL (ref 30.0–36.0)
MCV: 84.8 fL (ref 80.0–100.0)
Platelets: 192 10*3/uL (ref 150–400)
RBC: 5.34 MIL/uL (ref 4.22–5.81)
RDW: 17.4 % — ABNORMAL HIGH (ref 11.5–15.5)
WBC: 5.9 10*3/uL (ref 4.0–10.5)
nRBC: 0 % (ref 0.0–0.2)

## 2019-11-07 LAB — BASIC METABOLIC PANEL
Anion gap: 10 (ref 5–15)
BUN: 12 mg/dL (ref 6–20)
CO2: 27 mmol/L (ref 22–32)
Calcium: 8.7 mg/dL — ABNORMAL LOW (ref 8.9–10.3)
Chloride: 106 mmol/L (ref 98–111)
Creatinine, Ser: 1.26 mg/dL — ABNORMAL HIGH (ref 0.61–1.24)
GFR calc Af Amer: >60 mL/min (ref >60)
GFR calc non Af Amer: >60 mL/min (ref >60)
Glucose, Bld: 128 mg/dL — ABNORMAL HIGH (ref 70–99)
Potassium: 3.9 mmol/L (ref 3.5–5.1)
Sodium: 143 mmol/L (ref 135–145)

## 2019-11-07 LAB — POCT ACTIVATED CLOTTING TIME
Activated Clotting Time: 241 seconds
Activated Clotting Time: 290 seconds

## 2019-11-07 SURGERY — ATRIAL FIBRILLATION ABLATION
Anesthesia: General

## 2019-11-07 MED ORDER — SODIUM CHLORIDE 0.9 % IV SOLN: INTRAVENOUS | Status: DC

## 2019-11-07 MED ORDER — FENTANYL CITRATE (PF) 100 MCG/2ML IJ SOLN
INTRAMUSCULAR | Status: AC
  Filled 2019-11-07: qty 2

## 2019-11-07 MED ORDER — HEPARIN SODIUM (PORCINE) 1000 UNIT/ML IJ SOLN
INTRAMUSCULAR | Status: AC
  Filled 2019-11-07: qty 1

## 2019-11-07 MED ORDER — ONDANSETRON HCL 4 MG/2ML IJ SOLN: 4.0000 mg | Freq: Four times a day (QID) | INTRAMUSCULAR | Status: DC | PRN

## 2019-11-07 MED ORDER — SODIUM CHLORIDE 0.9 % IV SOLN: 250.0000 mL | INTRAVENOUS | Status: DC | PRN

## 2019-11-07 MED ORDER — DOBUTAMINE IN D5W 4-5 MG/ML-% IV SOLN
INTRAVENOUS | Status: DC | PRN
  Administered 2019-11-07: 20 ug/kg/min via INTRAVENOUS

## 2019-11-07 MED ORDER — FENTANYL CITRATE (PF) 250 MCG/5ML IJ SOLN
INTRAMUSCULAR | Status: DC | PRN
  Administered 2019-11-07: 100 ug via INTRAVENOUS

## 2019-11-07 MED ORDER — SUGAMMADEX SODIUM 200 MG/2ML IV SOLN
INTRAVENOUS | Status: DC | PRN
  Administered 2019-11-07: 280 mg via INTRAVENOUS

## 2019-11-07 MED ORDER — PROTAMINE SULFATE 10 MG/ML IV SOLN
INTRAVENOUS | Status: DC | PRN
  Administered 2019-11-07: 30 mg via INTRAVENOUS
  Administered 2019-11-07: 20 mg via INTRAVENOUS

## 2019-11-07 MED ORDER — HEPARIN (PORCINE) IN NACL 1000-0.9 UT/500ML-% IV SOLN
INTRAVENOUS | Status: DC | PRN
  Administered 2019-11-07 (×5): 500 mL

## 2019-11-07 MED ORDER — FENTANYL CITRATE (PF) 100 MCG/2ML IJ SOLN
25.0000 ug | INTRAMUSCULAR | Status: DC | PRN
  Administered 2019-11-07 (×3): 50 ug via INTRAVENOUS
  Filled 2019-11-07: qty 2

## 2019-11-07 MED ORDER — DEXAMETHASONE SODIUM PHOSPHATE 10 MG/ML IJ SOLN
INTRAMUSCULAR | Status: DC | PRN
  Administered 2019-11-07: 4 mg via INTRAVENOUS

## 2019-11-07 MED ORDER — PROPOFOL 10 MG/ML IV BOLUS
INTRAVENOUS | Status: DC | PRN
  Administered 2019-11-07 (×2): 50 mg via INTRAVENOUS
  Administered 2019-11-07: 100 mg via INTRAVENOUS
  Administered 2019-11-07: 200 mg via INTRAVENOUS

## 2019-11-07 MED ORDER — HEPARIN (PORCINE) IN NACL 1000-0.9 UT/500ML-% IV SOLN
INTRAVENOUS | Status: AC
  Filled 2019-11-07: qty 2500

## 2019-11-07 MED ORDER — SODIUM CHLORIDE 0.9% FLUSH: 3.0000 mL | INTRAVENOUS | Status: DC | PRN

## 2019-11-07 MED ORDER — LIDOCAINE 2% (20 MG/ML) 5 ML SYRINGE
INTRAMUSCULAR | Status: DC | PRN
  Administered 2019-11-07: 60 mg via INTRAVENOUS

## 2019-11-07 MED ORDER — SUCCINYLCHOLINE CHLORIDE 200 MG/10ML IV SOSY
PREFILLED_SYRINGE | INTRAVENOUS | Status: DC | PRN
  Administered 2019-11-07: 160 mg via INTRAVENOUS

## 2019-11-07 MED ORDER — MIDAZOLAM HCL 5 MG/5ML IJ SOLN
INTRAMUSCULAR | Status: DC | PRN
  Administered 2019-11-07: 2 mg via INTRAVENOUS

## 2019-11-07 MED ORDER — ROCURONIUM BROMIDE 10 MG/ML (PF) SYRINGE
PREFILLED_SYRINGE | INTRAVENOUS | Status: DC | PRN
  Administered 2019-11-07: 50 mg via INTRAVENOUS

## 2019-11-07 MED ORDER — HEPARIN SODIUM (PORCINE) 1000 UNIT/ML IJ SOLN
INTRAMUSCULAR | Status: DC | PRN
  Administered 2019-11-07: 15000 [IU] via INTRAVENOUS
  Administered 2019-11-07: 8000 [IU] via INTRAVENOUS

## 2019-11-07 MED ORDER — ACETAMINOPHEN 325 MG PO TABS
650.0000 mg | ORAL_TABLET | ORAL | Status: DC | PRN
  Filled 2019-11-07: qty 2

## 2019-11-07 MED ORDER — ONDANSETRON HCL 4 MG/2ML IJ SOLN
INTRAMUSCULAR | Status: DC | PRN
  Administered 2019-11-07: 4 mg via INTRAVENOUS

## 2019-11-07 MED ORDER — FENTANYL CITRATE (PF) 100 MCG/2ML IJ SOLN
25.0000 ug | Freq: Once | INTRAMUSCULAR | Status: AC
  Administered 2019-11-07: 25 ug via INTRAVENOUS
  Filled 2019-11-07: qty 2

## 2019-11-07 MED ORDER — SODIUM CHLORIDE 0.9% FLUSH: 3.0000 mL | Freq: Two times a day (BID) | INTRAVENOUS | Status: DC

## 2019-11-07 MED ORDER — DOBUTAMINE IN D5W 4-5 MG/ML-% IV SOLN
INTRAVENOUS | Status: AC
  Filled 2019-11-07: qty 250

## 2019-11-07 MED ORDER — HEPARIN SODIUM (PORCINE) 1000 UNIT/ML IJ SOLN
INTRAMUSCULAR | Status: DC | PRN
  Administered 2019-11-07: 1000 [IU] via INTRAVENOUS

## 2019-11-07 SURGICAL SUPPLY — 21 items
BLANKET WARM UNDERBOD FULL ACC (MISCELLANEOUS) ×3 IMPLANT
CATH MAPPNG PENTARAY F 2-6-2MM (CATHETERS) IMPLANT
CATH SMTCH THERMOCOOL SF DF (CATHETERS) ×2 IMPLANT
CATH SOUNDSTAR ECO 8FR (CATHETERS) ×2 IMPLANT
CATH WEBSTER BI DIR CS D-F CRV (CATHETERS) ×2 IMPLANT
COVER SWIFTLINK CONNECTOR (BAG) ×3 IMPLANT
DEVICE CLOSURE PERCLS PRGLD 6F (VASCULAR PRODUCTS) IMPLANT
PACK EP LATEX FREE (CUSTOM PROCEDURE TRAY) ×3
PACK EP LF (CUSTOM PROCEDURE TRAY) ×1 IMPLANT
PAD PRO RADIOLUCENT 2001M-C (PAD) ×3 IMPLANT
PATCH CARTO3 (PAD) ×2 IMPLANT
PENTARAY F 2-6-2MM (CATHETERS) ×3
PERCLOSE PROGLIDE 6F (VASCULAR PRODUCTS) ×12
SHEATH BAYLIS SUREFLEX  M 8.5 (SHEATH) ×3
SHEATH BAYLIS SUREFLEX M 8.5 (SHEATH) IMPLANT
SHEATH BAYLIS TRANSSEPTAL 98CM (NEEDLE) ×2 IMPLANT
SHEATH CARTO VIZIGO SM CVD (SHEATH) ×2 IMPLANT
SHEATH PINNACLE 7F 10CM (SHEATH) ×2 IMPLANT
SHEATH PINNACLE 8F 10CM (SHEATH) ×4 IMPLANT
SHEATH PINNACLE 9F 10CM (SHEATH) ×2 IMPLANT
TUBING SMART ABLATE COOLFLOW (TUBING) ×2 IMPLANT

## 2019-11-07 NOTE — Anesthesia Procedure Notes (Signed)
Procedure Name: Intubation Date/Time: 11/07/2019 7:37 AM Performed by: Renato Shin, CRNA Pre-anesthesia Checklist: Patient identified, Emergency Drugs available, Suction available and Patient being monitored Patient Re-evaluated:Patient Re-evaluated prior to induction Oxygen Delivery Method: Circle system utilized Preoxygenation: Pre-oxygenation with 100% oxygen Induction Type: IV induction Ventilation: Mask ventilation without difficulty Laryngoscope Size: Glidescope and 4 Grade View: Grade I Tube type: Oral Tube size: 7.5 mm Number of attempts: 1 Airway Equipment and Method: Stylet and Oral airway Placement Confirmation: ETT inserted through vocal cords under direct vision,  positive ETCO2 and breath sounds checked- equal and bilateral Secured at: 22 cm Tube secured with: Tape Dental Injury: Teeth and Oropharynx as per pre-operative assessment  Difficulty Due To: Difficulty was anticipated and Difficult Airway- due to large tongue Future Recommendations: Recommend- induction with short-acting agent, and alternative techniques readily available

## 2019-11-07 NOTE — Anesthesia Postprocedure Evaluation (Signed)
Anesthesia Post Note  Patient: Samuel Flynn  Procedure(s) Performed: ATRIAL FIBRILLATION ABLATION (N/A )     Patient location during evaluation: Cath Lab Anesthesia Type: General Level of consciousness: awake and alert Pain management: pain level controlled Vital Signs Assessment: post-procedure vital signs reviewed and stable Respiratory status: spontaneous breathing, nonlabored ventilation, respiratory function stable and patient connected to nasal cannula oxygen Cardiovascular status: blood pressure returned to baseline and stable Postop Assessment: no apparent nausea or vomiting Anesthetic complications: no    Last Vitals:  Vitals:   11/07/19 1103 11/07/19 1207  BP: (!) 147/70 (!) 144/78  Pulse: 74 65  Resp:    Temp:    SpO2:      Last Pain:  Vitals:   11/07/19 1207  TempSrc:   PainSc: 7                  [name]

## 2019-11-07 NOTE — H&P (Signed)
Samuel Flynn has presented today for surgery, with the diagnosis of atrial fibrillation.  The various methods of treatment have been discussed with the patient and family. After consideration of risks, benefits and other options for treatment, the patient has consented to  Procedure(s): Catheter ablation as a surgical intervention .  Risks include but not limited to bleeding, tamponade, heart block, stroke, damage to surrounding organs, among others. The patient's history has been reviewed, patient examined, no change in status, stable for surgery.  I have reviewed the patient's chart and labs.  Questions were answered to the patient's satisfaction.    Ashari Llewellyn Curt Bears, MD 11/07/2019 7:10 AM

## 2019-11-07 NOTE — Progress Notes (Signed)
Pt arrived from the EP lab with bilateral groin pain that has progressed during hi stay in Cath Lab Recovery. Dr Su Hilt was notified, and Dr. Linna Caprice also stopped to see the patient and ordered Fentanyl IV push for pain. Two doses of 50 mcg Fentanyl were given over of course of 15 min. With pain still at level 7/8. on a scale of 0-10. Dr. Su Hilt was notified again, and PA Joseph Art was paged.

## 2019-11-07 NOTE — Transfer of Care (Signed)
Immediate Anesthesia Transfer of Care Note  Patient: Samuel Flynn  Procedure(s) Performed: ATRIAL FIBRILLATION ABLATION (N/A )  Patient Location: PACU  Anesthesia Type:General  Level of Consciousness: awake, alert  and patient cooperative  Airway & Oxygen Therapy: Patient Spontanous Breathing and Patient connected to nasal cannula oxygen  Post-op Assessment: Report given to RN and Post -op Vital signs reviewed and stable  Post vital signs: Reviewed and stable  Last Vitals:  Vitals Value Taken Time  BP 143/71 11/07/19 1011  Temp 36.3 C 11/07/19 1009  Pulse 76 11/07/19 1013  Resp 15 11/07/19 1013  SpO2 97 % 11/07/19 1013  Vitals shown include unvalidated device data.  Last Pain:  Vitals:   11/07/19 1009  TempSrc: Temporal  PainSc: 0-No pain      Patients Stated Pain Goal: 3 ([id number])  Complications: No apparent anesthesia complications

## 2019-11-07 NOTE — Discharge Instructions (Signed)
Post procedure care instructions No driving for 4 days. No lifting over 5 lbs for 1 week. No vigorous or sexual activity for 1 week. You may return to work/your usual activities on 11/14/2019. Keep procedure site clean & dry. If you notice increased pain, swelling, bleeding or pus, call/return!  You may shower, but no soaking baths/hot tubs/pools for 1 week.     Cardiac Ablation, Care After  This sheet gives you information about how to care for yourself after your procedure. Your health care provider may also give you more specific instructions. If you have problems or questions, contact your health care provider. What can I expect after the procedure? After the procedure, it is common to have:  Bruising around your puncture site.  Tenderness around your puncture site.  Skipped heartbeats.  Tiredness (fatigue).  Follow these instructions at home: Puncture site care   Follow instructions from your health care provider about how to take care of your puncture site. Make sure you: ? If present, leave stitches (sutures), skin glue, or adhesive strips in place. These skin closures may need to stay in place for up to 2 weeks. If adhesive strip edges start to loosen and curl up, you may trim the loose edges. Do not remove adhesive strips completely unless your health care provider tells you to do that.  Check your puncture site every day for signs of infection. Check for: ? Redness, swelling, or pain. ? Fluid or blood. If your puncture site starts to bleed, lie down on your back, apply firm pressure to the area, and contact your health care provider. ? Warmth. ? Pus or a bad smell. Driving  Do not drive for at least 4 days after your procedure or however long your health care provider recommends. (Do not resume driving if you have previously been instructed not to drive for other health reasons.)  Do not drive or use heavy machinery while taking prescription pain  medicine. Activity  Avoid activities that take a lot of effort for at least 7 days after your procedure.  Do not lift anything that is heavier than 5 lb (4.5 kg) for one week.   No sexual activity for 1 week.   Return to your normal activities as told by your health care provider. Ask your health care provider what activities are safe for you. General instructions  Take over-the-counter and prescription medicines only as told by your health care provider.  Do not use any products that contain nicotine or tobacco, such as cigarettes and e-cigarettes. If you need help quitting, ask your health care provider.  You may shower after 24 hours, but Do not take baths, swim, or use a hot tub for 1 week.   Do not drink alcohol for 24 hours after your procedure.  Keep all follow-up visits as told by your health care provider. This is important. Contact a health care provider if:  You have redness, mild swelling, or pain around your puncture site.  You have fluid or blood coming from your puncture site that stops after applying firm pressure to the area.  Your puncture site feels warm to the touch.  You have pus or a bad smell coming from your puncture site.  You have a fever.  You have chest pain or discomfort that spreads to your neck, jaw, or arm.  You are sweating a lot.  You feel nauseous.  You have a fast or irregular heartbeat.  You have shortness of breath.  You are dizzy  or light-headed and feel the need to lie down.  You have pain or numbness in the arm or leg closest to your puncture site. Get help right away if:  Your puncture site suddenly swells.  Your puncture site is bleeding and the bleeding does not stop after applying firm pressure to the area. These symptoms may represent a serious problem that is an emergency. Do not wait to see if the symptoms will go away. Get medical help right away. Call your local emergency services (911 in the [place] Do not drive  yourself to the hospital. Summary  After the procedure, it is normal to have bruising and tenderness at the puncture site in your groin, neck, or forearm.  Check your puncture site every day for signs of infection.  Get help right away if your puncture site is bleeding and the bleeding does not stop after applying firm pressure to the area. This is a medical emergency. This information is not intended to replace advice given to you by your health care provider. Make sure you discuss any questions you have with your health care provider.    You have an appointment set up with the [facility].  Multiple studies have shown that being followed by a dedicated atrial fibrillation clinic in addition to the standard care you receive from your other physicians improves health. We believe that enrollment in the atrial fibrillation clinic will allow [place] to better care for you.   The phone number to the [facility] is [phone]. The clinic is staffed Monday through Friday from 8:30am to 5pm.  Parking Directions: The clinic is located in the Heart and Vascular Building connected to [facility]. 1)From [address] turn on to [address] and go to the 3rd entrance  (Heart and Vascular entrance) on the right. 2)Look to the right for Heart &Vascular Parking Garage. 3)A code for the entrance is required please call the clinic to receive this.   4)Take the elevators to the 1st floor. Registration is in the room with the glass walls at the end of the hallway.  If you have any trouble parking or locating the clinic, please don't hesitate to call [phone].

## 2019-11-07 NOTE — Anesthesia Preprocedure Evaluation (Signed)
Anesthesia Evaluation  Patient identified by MRN, date of birth, ID band Patient awake    Reviewed: Allergy & Precautions, NPO status , Patient's Chart, lab work & pertinent test results  Airway Mallampati: III  TM Distance: >3 FB Neck ROM: Full    Dental  (+) Teeth Intact, Dental Advisory Given   Pulmonary former smoker,    breath sounds clear to auscultation       Cardiovascular hypertension,  Rhythm:Regular     Neuro/Psych    GI/Hepatic   Endo/Other    Renal/GU      Musculoskeletal   Abdominal (+) + obese,   Peds  Hematology   Anesthesia Other Findings   Reproductive/Obstetrics                             Anesthesia Physical Anesthesia Plan  ASA: III  Anesthesia Plan: General   Post-op Pain Management:    Induction: Intravenous  PONV Risk Score and Plan: Ondansetron and Dexamethasone  Airway Management Planned: Oral ETT  Additional Equipment:   Intra-op Plan:   Post-operative Plan: Extubation in OR  Informed Consent: I have reviewed the patients History and Physical, chart, labs and discussed the procedure including the risks, benefits and alternatives for the proposed anesthesia with the patient or authorized representative who has indicated his/her understanding and acceptance.     Dental advisory given  Plan Discussed with: CRNA and Anesthesiologist  Anesthesia Plan Comments:         Anesthesia Quick Evaluation

## 2019-11-28 ENCOUNTER — Other Ambulatory Visit: Payer: Self-pay

## 2019-11-28 MED ORDER — ALLOPURINOL 100 MG PO TABS: 100.0000 mg | ORAL_TABLET | Freq: Every day | ORAL | 0 refills | Status: DC

## 2019-11-28 NOTE — Progress Notes (Signed)
Patient called & informed that Rx had been sent.

## 2019-12-04 ENCOUNTER — Ambulatory Visit (HOSPITAL_COMMUNITY): Payer: PRIVATE HEALTH INSURANCE | Admitting: Nurse Practitioner

## 2020-01-07 ENCOUNTER — Telehealth: Payer: Self-pay | Admitting: Family Medicine

## 2020-01-07 NOTE — Telephone Encounter (Signed)
90 day supply was sent on 11/28/2019.  E-Prescribing Status: Receipt confirmed by pharmacy (11/28/2019 11:42 AM EDT)

## 2020-01-07 NOTE — Telephone Encounter (Signed)
1) Medication(s) Requested (by name):allopurinol (ZYLOPRIM) 100 MG tablet [id number]    2) Pharmacy of Choice:[id number] - [place], [address], [place] [address]   3) Special Requests:   Approved medications will be sent to the pharmacy, we will reach out if there is an issue.  Requests made after 3pm may not be addressed until the following business day!  If a patient is unsure of the name of the medication(s) please note and ask patient to call back when they are able to provide all info, do not send to responsible party until all information is available!

## 2020-01-08 ENCOUNTER — Telehealth: Payer: PRIVATE HEALTH INSURANCE | Admitting: Internal Medicine

## 2020-01-28 ENCOUNTER — Emergency Department (HOSPITAL_COMMUNITY): Payer: PRIVATE HEALTH INSURANCE

## 2020-01-28 ENCOUNTER — Emergency Department (HOSPITAL_COMMUNITY)
Admission: EM | Admit: 2020-01-28 | Discharge: 2020-01-29 | Disposition: A | Discharge status: Home / Self Care | Payer: PRIVATE HEALTH INSURANCE | Attending: Emergency Medicine | Admitting: Emergency Medicine

## 2020-01-28 ENCOUNTER — Encounter (HOSPITAL_COMMUNITY): Payer: Self-pay | Admitting: *Deleted

## 2020-01-28 ENCOUNTER — Other Ambulatory Visit: Payer: Self-pay

## 2020-01-28 ENCOUNTER — Telehealth (INDEPENDENT_AMBULATORY_CARE_PROVIDER_SITE_OTHER): Payer: PRIVATE HEALTH INSURANCE | Admitting: Internal Medicine

## 2020-01-28 DIAGNOSIS — R0602 Shortness of breath: Secondary | ICD-10-CM | POA: Diagnosis not present

## 2020-01-28 DIAGNOSIS — R2243 Localized swelling, mass and lump, lower limb, bilateral: Secondary | ICD-10-CM | POA: Diagnosis not present

## 2020-01-28 DIAGNOSIS — Z5321 Procedure and treatment not carried out due to patient leaving prior to being seen by health care provider: Secondary | ICD-10-CM | POA: Insufficient documentation

## 2020-01-28 DIAGNOSIS — R0789 Other chest pain: Secondary | ICD-10-CM | POA: Insufficient documentation

## 2020-01-28 DIAGNOSIS — R600 Localized edema: Secondary | ICD-10-CM

## 2020-01-28 DIAGNOSIS — R635 Abnormal weight gain: Secondary | ICD-10-CM | POA: Diagnosis not present

## 2020-01-28 DIAGNOSIS — I5033 Acute on chronic diastolic (congestive) heart failure: Secondary | ICD-10-CM

## 2020-01-28 DIAGNOSIS — M1000 Idiopathic gout, unspecified site: Secondary | ICD-10-CM

## 2020-01-28 LAB — BASIC METABOLIC PANEL
Anion gap: 10 (ref 5–15)
BUN: 10 mg/dL (ref 6–20)
CO2: 24 mmol/L (ref 22–32)
Calcium: 8.7 mg/dL — ABNORMAL LOW (ref 8.9–10.3)
Chloride: 105 mmol/L (ref 98–111)
Creatinine, Ser: 1.25 mg/dL — ABNORMAL HIGH (ref 0.61–1.24)
GFR calc Af Amer: >60 mL/min (ref >60)
GFR calc non Af Amer: >60 mL/min (ref >60)
Glucose, Bld: 106 mg/dL — ABNORMAL HIGH (ref 70–99)
Potassium: 3.9 mmol/L (ref 3.5–5.1)
Sodium: 139 mmol/L (ref 135–145)

## 2020-01-28 LAB — CBC
HCT: 42.2 % (ref 39.0–52.0)
Hemoglobin: 13.8 g/dL (ref 13.0–17.0)
MCH: 27.9 pg (ref 26.0–34.0)
MCHC: 32.7 g/dL (ref 30.0–36.0)
MCV: 85.3 fL (ref 80.0–100.0)
Platelets: 251 10*3/uL (ref 150–400)
RBC: 4.95 MIL/uL (ref 4.22–5.81)
RDW: 16.9 % — ABNORMAL HIGH (ref 11.5–15.5)
WBC: 7.5 10*3/uL (ref 4.0–10.5)
nRBC: 0 % (ref 0.0–0.2)

## 2020-01-28 LAB — TROPONIN I (HIGH SENSITIVITY)
Troponin I (High Sensitivity): 11 ng/L (ref <18)
Troponin I (High Sensitivity): 11 ng/L (ref <18)

## 2020-01-28 MED ORDER — SODIUM CHLORIDE 0.9% FLUSH: 3.0000 mL | Freq: Once | INTRAVENOUS | Status: DC

## 2020-01-28 NOTE — Progress Notes (Signed)
Virtual Visit via Telephone Note  I connected with Samuel Flynn, on 01/28/2020 at 3:45 PM by telephone due to the COVID-19 pandemic and verified that I am speaking with the correct person using two identifiers.   Consent: I discussed the limitations, risks, security and privacy concerns of performing an evaluation and management service by telephone and the availability of in person appointments. I also discussed with the patient that there may be a patient responsible charge related to this service. The patient expressed understanding and agreed to proceed.   Location of Patient: Home   Location of Provider: Clinic    Persons participating in Telemedicine visit: [facility] Dr. Juleen China      History of Present Illness: Patient has a visit for concern for LE edema. Patient has a history of Afib, on Xarelto. He also has a history of diastolic CHF with preserved EF 55-60%.   Patient reports that he was instructed by his dietician yesterday to call his doctor about LE edema. Has been present since ablation on 3/5 for Afib. He weighs himself daily. Reports he has been gaining weight for several months. However, says he has gained 30 lbs this past week. He was 302 lbs last week and he is now 333 lbs today. He feels like he has excessive fluid and can feel it around his chest. He is having SOB. Dry cough. He has been hospitalized for heart failure and required IV diuresis. He has been taking Lasix 40 mg BID. Feels like it stopped working.   Also reports he is having a lot of problems with gout. Seems like colchicine and allopurinol are not working despite compliance. Having a flare multiple days per work in various locations---ankle, wrist, big toe, etc.      Past Medical History:  Diagnosis Date  . Atrial fibrillation with RVR ([place])   . Gastric ulcer   . Gout   . Hypertension   . Paroxysmal A-fib ([place])   . Sleep apnea    USES CPAP  . Wears glasses     No Known Allergies  Current Outpatient Medications on File Prior to Visit  Medication Sig Dispense Refill  . allopurinol (ZYLOPRIM) 100 MG tablet Take 1 tablet (100 mg total) by mouth daily. For gout 90 tablet 0  . colchicine 0.6 MG tablet Take 0.6 mg by mouth daily as needed (gout flare).   1  . furosemide (LASIX) 40 MG tablet Take 1 tablet (40 mg total) by mouth 2 (two) times daily. 60 tablet 6  . methocarbamol (ROBAXIN) 500 MG tablet Take 2 tablets (1,000 mg total) by mouth every 8 (eight) hours as needed for muscle spasms. 90 tablet 0  . metoprolol succinate (TOPROL-XL) 100 MG 24 hr tablet Take 1 tablet (100 mg total) by mouth daily. Take with or immediately following a meal. 90 tablet 3  . pantoprazole (PROTONIX) 40 MG tablet Take 1 tablet (40 mg total) by mouth daily. 90 tablet 4  . rivaroxaban (XARELTO) 20 MG TABS tablet TAKE 1 TABLET (20 MG TOTAL) BY MOUTH DAILY WITH SUPPER. (Patient taking differently: Take 20 mg by mouth daily. ) 90 tablet 1   No current facility-administered medications on file prior to visit.    Observations/Objective: NAD. Speaking clearly.  Work of breathing normal.  Alert and oriented. Mood appropriate.   Assessment and Plan: 1. Acute on chronic diastolic heart failure ([place]) 2. Lower extremity edema 3. Shortness of breath 4. Weight gain Given significant weight gain, SOB,  lower extremity edema with h/o CHF requiring in hospital diuresis for prior exacerbation, I am most concerned for a CHF exacerbation. I have recommended that patient go to [facility] for evaluation. Discussed that needs in person exam evaluating the edema, auscultating lungs, and checking vital signs particularly for hypoxia. Likely warrants BMET, BNP, CXR. Pending results, may require inpatient admission for IV diuresis. As edema is bilateral per patient and he is compliant with anticoagulation, less concerned for VTE.   5. Idiopathic gout, unspecified chronicity, unspecified  site Reporting significant gout flares. If admitted, consider systemic steroid for management and/or increase in Colchicine dose. Treatment will need to take into consideration other chronic medical conditions and if he is indeed having CHF exacerbation.    Follow Up Instructions: Go to [facility], follow up post ER visit    I discussed the assessment and treatment plan with the patient. The patient was provided an opportunity to ask questions and all were answered. The patient agreed with the plan and demonstrated an understanding of the instructions.   The patient was advised to call back or seek an in-person evaluation if the symptoms worsen or if the condition fails to improve as anticipated.     I provided 20 minutes total of non-face-to-face time during this encounter including median intraservice time, reviewing previous notes, investigations, ordering medications, medical decision making, coordinating care and patient verbalized understanding at the end of the visit.    Phill Myron, D.O. Primary Care at [facility]  01/28/2020, 3:45 PM

## 2020-01-28 NOTE — ED Triage Notes (Signed)
Pt reporting shortness of breath and increased leg swelling. Gained 30lbs since Friday. He is on Lasix. NAD. Dyspnea with exertion. Left sided chest pain 5/10

## 2020-01-29 NOTE — ED Notes (Signed)
Pt tells [place] he is leaving. Encouraged to stay. Informed of risks. Refuses to stay. Seen leaving lobby.

## 2020-02-09 ENCOUNTER — Telehealth: Payer: Self-pay | Admitting: Family Medicine

## 2020-02-09 MED ORDER — COLCHICINE 0.6 MG PO TABS: 0.6000 mg | ORAL_TABLET | Freq: Every day | ORAL | 1 refills | Status: DC | PRN

## 2020-02-09 NOTE — Telephone Encounter (Signed)
Refills completed

## 2020-02-09 NOTE — Telephone Encounter (Signed)
Pt called asking for colchicine 0.6 MG tablet [[id number]]  Please send to [id number] - [place], [place], [place]

## 2020-02-10 ENCOUNTER — Other Ambulatory Visit: Payer: Self-pay

## 2020-02-10 ENCOUNTER — Ambulatory Visit: Payer: PRIVATE HEALTH INSURANCE | Admitting: Cardiology

## 2020-02-15 ENCOUNTER — Other Ambulatory Visit: Payer: Self-pay | Admitting: Medical

## 2020-02-26 ENCOUNTER — Other Ambulatory Visit: Payer: Self-pay

## 2020-02-26 MED ORDER — ALLOPURINOL 100 MG PO TABS: 100.0000 mg | ORAL_TABLET | Freq: Every day | ORAL | 0 refills | Status: DC

## 2020-03-15 ENCOUNTER — Other Ambulatory Visit (HOSPITAL_COMMUNITY): Payer: Self-pay | Admitting: Nurse Practitioner

## 2020-03-18 ENCOUNTER — Ambulatory Visit
Admission: EM | Admit: 2020-03-18 | Discharge: 2020-03-18 | Disposition: A | Discharge status: Home / Self Care | Payer: PRIVATE HEALTH INSURANCE

## 2020-03-18 ENCOUNTER — Other Ambulatory Visit: Payer: Self-pay

## 2020-03-18 DIAGNOSIS — H6123 Impacted cerumen, bilateral: Secondary | ICD-10-CM

## 2020-03-18 NOTE — ED Provider Notes (Signed)
[facility]    CSN: [id number] Arrival date & time: 03/18/20  1624      History   Chief Complaint Chief Complaint  Patient presents with  . Medication Refill  . Otalgia    HPI Samuel Flynn is a 44 y.o. male.   44 year old male comes in for multiple complaints  1. Bilateral ear fullness/decreased hearing. Denies pain, drainage. States having trouble hearing out of the ears and would like it flushed.  2. Refill for allopurinol. States got message from pharmacy stating they are waiting for PCP to refill medicine, states PCP told him he needs to be seen before refilling medicine and told to come to urgent care. He states he "keeps a gout flare" at all times and medication daily. No worsening of symptoms.      Past Medical History:  Diagnosis Date  . Atrial fibrillation with RVR ([place])   . Gastric ulcer   . Gout   . Hypertension   . Paroxysmal A-fib ([place])   . Sleep apnea    USES CPAP  . Wears glasses     Patient Active Problem List   Diagnosis Date Noted  . Persistent atrial fibrillation ([place])   . Acute on chronic diastolic heart failure ([place]) 07/15/2019  . Atrial flutter ([place]) 07/13/2019  . Normal coronary arteries 04/11/2019  . OSA (obstructive sleep apnea) 08/13/2018  . GAD (generalized anxiety disorder) 08/13/2018  . Acute pulmonary edema ([place]) 08/13/2018  . S/P ablation of atrial fibrillation 04/26/18 04/27/2018  . Paroxysmal atrial fibrillation ([place]) 04/26/2018  . Atrial fibrillation with RVR ([place]) 04/10/2016  . Essential hypertension, benign 02/19/2014  . Gout 12/27/2006  . Morbid obesity ([place]) 12/27/2006    Past Surgical History:  Procedure Laterality Date  . ATRIAL FIBRILLATION ABLATION N/A 04/26/2018   Procedure: ATRIAL FIBRILLATION ABLATION;  Surgeon: Constance Haw, MD;  Location: [facility];  Service: Cardiovascular;  Laterality: N/A;  . ATRIAL FIBRILLATION ABLATION N/A 11/07/2019   Procedure: ATRIAL FIBRILLATION  ABLATION;  Surgeon: Constance Haw, MD;  Location: [facility];  Service: Cardiovascular;  Laterality: N/A;  . CARDIOVERSION N/A 01/18/2018   Procedure: CARDIOVERSION;  Surgeon: Larey Dresser, MD;  Location: [facility] ENDOSCOPY;  Service: Cardiovascular;  Laterality: N/A;  . CARDIOVERSION N/A 08/19/2019   Procedure: CARDIOVERSION;  Surgeon: Thayer Headings, MD;  Location: [facility] ENDOSCOPY;  Service: Cardiovascular;  Laterality: N/A;  . ELBOW LIGAMENT RECONSTRUCTION Right 09/29/2014   Procedure: Merian Capron FRACTURE FIXATION, POSSIBLE LIGAMENT REPAIR;  Surgeon: Nita Sells, MD;  Location: [facility];  Service: Orthopedics;  Laterality: Right;  Right elbow coranoid fracture fixation, possible ligament repair  . TEE WITHOUT CARDIOVERSION N/A 01/18/2018   Procedure: TRANSESOPHAGEAL ECHOCARDIOGRAM (TEE);  Surgeon: Larey Dresser, MD;  Location: [facility]OSCOPY;  Service: Cardiovascular;  Laterality: N/A;  . WISDOM TOOTH EXTRACTION         Home Medications    Prior to Admission medications   Medication Sig Start Date End Date Taking? Authorizing Provider  allopurinol (ZYLOPRIM) 100 MG tablet Take 1 tablet (100 mg total) by mouth daily. For gout 02/26/20   Nicolette Bang, DO  colchicine 0.6 MG tablet Take 1 tablet (0.6 mg total) by mouth daily as needed (gout flare). 02/09/20   Elsie Stain, MD  furosemide (LASIX) 40 MG tablet TAKE 1 TABLET BY MOUTH TWICE A DAY 02/17/20   Furth, Cadence H, PA-C  methocarbamol (ROBAXIN) 500 MG tablet Take 2 tablets (1,000  mg total) by mouth every 8 (eight) hours as needed for muscle spasms. 09/03/19   Argentina Donovan, PA-C  metoprolol succinate (TOPROL-XL) 100 MG 24 hr tablet Take 1 tablet (100 mg total) by mouth daily. Take with or immediately following a meal. 08/11/19   Fulp, Cammie, MD  pantoprazole (PROTONIX) 40 MG tablet Take 1 tablet (40 mg total) by mouth daily. 08/11/19   Fulp, Cammie, MD  rivaroxaban (XARELTO) 20 MG  TABS tablet TAKE 1 TABLET (20 MG TOTAL) BY MOUTH DAILY WITH SUPPER. Patient taking differently: Take 20 mg by mouth daily.  10/10/19   Camnitz, Ocie Doyne, MD    Family History Family History  Problem Relation Age of Onset  . Hypertension Paternal Uncle   . Hypertension Maternal Grandmother   . Hypertension Paternal Uncle   . Hypertension Paternal Uncle   . Hypertension Paternal Uncle   . Hypertension Paternal Uncle   . Hypertension Mother   . Stroke Father   . Heart disease Father   . Heart attack Father   . Hypertension Father   . Colon cancer Neg Hx     Social History Social History   Tobacco Use  . Smoking status: Former Smoker    Types: Cigars    Quit date: 02/03/2016    Years since quitting: 4.1  . Smokeless tobacco: Never Used  Vaping Use  . Vaping Use: Never used  Substance Use Topics  . Alcohol use: Yes    Alcohol/week: 0.0 standard drinks    Comment: occ  . Drug use: No     Allergies   Patient has no known allergies.   Review of Systems Review of Systems  Reason unable to perform ROS: See HPI as above.     Physical Exam Triage Vital Signs ED Triage Vitals [03/18/20 1629]  Enc Vitals Group     BP (!) 166/90     Pulse Rate 64     Resp 16     Temp 98.4 F (36.9 C)     Temp src      SpO2 95 %     Weight      Height      Head Circumference      Peak Flow      Pain Score 10     Pain Loc      Pain Edu?      Excl. in [place]?    No data found.  Updated Vital Signs BP (!) 166/90   Pulse 64   Temp 98.4 F (36.9 C)   Resp 16   SpO2 95%   Physical Exam Constitutional:      General: He is not in acute distress.    Appearance: Normal appearance. He is well-developed. He is not toxic-appearing or diaphoretic.  HENT:     Head: Normocephalic and atraumatic.     Ears:     Comments: Bilateral cerumen impaction, TM not visible.   Post ear irrigation, TM visible, no erythema/bulging.  Eyes:     Conjunctiva/sclera: Conjunctivae normal.      Pupils: Pupils are equal, round, and reactive to light.  Pulmonary:     Effort: Pulmonary effort is normal. No respiratory distress.     Comments: Speaking in full sentences without difficulty Musculoskeletal:     Cervical back: Normal range of motion and neck supple.  Skin:    General: Skin is warm and dry.  Neurological:     Mental Status: He is alert and oriented to person, place,  and time.      UC Treatments / Results  Labs (all labs ordered are listed, but only abnormal results are displayed) Labs Reviewed - No data to display  EKG   Radiology No results found.  Procedures Procedures (including critical care time)  Medications Ordered in UC Medications - No data to display  Initial Impression / Assessment and Plan / UC Course  I have reviewed the triage vital signs and the nursing notes.  Pertinent labs & imaging results that were available during my care of the patient were reviewed by me and considered in my medical decision making (see chart for details).    1. Cerumen impaction Patient tolerated ear irrigation well. TM visible without otitis media. Discussed using flonase/nasacort if continues with symptoms. Return precautions given.   2. Medication refill Chart review shows that PCP refilled allopurinol 02/26/2020. Unsure if needing prior authorization given patient receiving message from pharmacy stating waiting for PCP response. Discussed urgent care does not manage chronic medications, and to follow up with PCP for further refills. Suggested to contact pharmacy to see what they are waiting on from PCP in case needing prior authorization.  Final Clinical Impressions(s) / UC Diagnoses   Final diagnoses:  Bilateral impacted cerumen   ED Prescriptions    None     PDMP not reviewed this encounter.   Ok Edwards, PA-C 03/18/20 1659

## 2020-03-18 NOTE — Discharge Instructions (Signed)
Ear wax removed today. If continue to have ear fullness, can try over the counter flonase/nasacort for possible eustachian tube dysfunction causing symptoms. Follow up for reevaluation if symptoms not improving, having pain.  

## 2020-03-18 NOTE — ED Triage Notes (Signed)
Pt requesting refill of gout medication, has been out for 4 days., states PCP will no refill without an appt  Also requesting to have ears irrigated

## 2020-03-22 ENCOUNTER — Encounter: Payer: Self-pay | Admitting: Cardiology

## 2020-03-22 ENCOUNTER — Other Ambulatory Visit: Payer: Self-pay

## 2020-03-22 ENCOUNTER — Ambulatory Visit (INDEPENDENT_AMBULATORY_CARE_PROVIDER_SITE_OTHER): Payer: PRIVATE HEALTH INSURANCE | Admitting: Physician Assistant

## 2020-03-22 ENCOUNTER — Ambulatory Visit: Payer: PRIVATE HEALTH INSURANCE | Admitting: Cardiology

## 2020-03-22 VITALS — BP 118/58 | HR 47 | Ht 74.0 in | Wt 321.0 lb

## 2020-03-22 VITALS — BP 114/75 | HR 51 | Temp 98.2°F | Resp 18 | Ht 74.0 in | Wt 319.0 lb

## 2020-03-22 DIAGNOSIS — M1000 Idiopathic gout, unspecified site: Secondary | ICD-10-CM

## 2020-03-22 DIAGNOSIS — I4819 Other persistent atrial fibrillation: Secondary | ICD-10-CM

## 2020-03-22 DIAGNOSIS — G4700 Insomnia, unspecified: Secondary | ICD-10-CM | POA: Diagnosis not present

## 2020-03-22 MED ORDER — HYDROXYZINE HCL 50 MG PO TABS: 50.0000 mg | ORAL_TABLET | Freq: Every evening | ORAL | 2 refills | Status: DC | PRN

## 2020-03-22 MED ORDER — COLCHICINE 0.6 MG PO TABS: 0.6000 mg | ORAL_TABLET | Freq: Every day | ORAL | 1 refills | Status: DC | PRN

## 2020-03-22 NOTE — Progress Notes (Signed)
Electrophysiology Office Note   Date:  03/22/2020   ID:  Samuel Flynn, DOB [birth year], MRN [id number]  PCP:  Scot Jun, FNP  Cardiologist:  Oval Linsey Primary Electrophysiologist:  Rox Mcgriff Meredith Leeds, MD    Chief Complaint: AF   History of Present Illness: Samuel Flynn is a 44 y.o. male who is being seen today for the evaluation of AF at the request of Scot Jun, FNP. Presenting today for electrophysiology evaluation.  He has a history of hypertension, obesity, OSA on CPAP, CKD, paroxysmal atrial fibrillation.  He is status post AF ablation 04/26/2018.  Unfortunately, he is continued to have atrial fibrillation throughout the year this year.  He has weakness, fatigue, and shortness of breath.  He has had multiple cardioversions.   He is now status post repeat AF ablation 11/07/2019.  Today, denies symptoms of palpitations, chest pain, shortness of breath, orthopnea, PND, lower extremity edema, claudication, dizziness, presyncope, syncope, bleeding, or neurologic sequela. The patient is tolerating medications without difficulties.  He currently feels well.  He has no chest pain extensive breath.  Is able to do all his daily activities without restriction.  He has not had any further episodes of atrial fibrillation since his ablation.   Past Medical History:  Diagnosis Date   Atrial fibrillation with RVR ([place])    Gastric ulcer    Gout    Hypertension    Paroxysmal A-fib ([facility])    Sleep apnea    USES CPAP   Wears glasses    Past Surgical History:  Procedure Laterality Date   ATRIAL FIBRILLATION ABLATION N/A 04/26/2018   Procedure: ATRIAL FIBRILLATION ABLATION;  Surgeon: Constance Haw, MD;  Location: [facility];  Service: Cardiovascular;  Laterality: N/A;   ATRIAL FIBRILLATION ABLATION N/A 11/07/2019   Procedure: ATRIAL FIBRILLATION ABLATION;  Surgeon: Constance Haw, MD;  Location: [facility];  Service: Cardiovascular;   Laterality: N/A;   CARDIOVERSION N/A 01/18/2018   Procedure: CARDIOVERSION;  Surgeon: Larey Dresser, MD;  Location: [facility] ENDOSCOPY;  Service: Cardiovascular;  Laterality: N/A;   CARDIOVERSION N/A 08/19/2019   Procedure: CARDIOVERSION;  Surgeon: Thayer Headings, MD;  Location: [facility];  Service: Cardiovascular;  Laterality: N/A;   ELBOW LIGAMENT RECONSTRUCTION Right 09/29/2014   Procedure: Merian Capron FRACTURE FIXATION, POSSIBLE LIGAMENT REPAIR;  Surgeon: Nita Sells, MD;  Location: [facility];  Service: Orthopedics;  Laterality: Right;  Right elbow coranoid fracture fixation, possible ligament repair   TEE WITHOUT CARDIOVERSION N/A 01/18/2018   Procedure: TRANSESOPHAGEAL ECHOCARDIOGRAM (TEE);  Surgeon: Larey Dresser, MD;  Location: [facility] ENDOSCOPY;  Service: Cardiovascular;  Laterality: N/A;   WISDOM TOOTH EXTRACTION       Current Outpatient Medications  Medication Sig Dispense Refill   allopurinol (ZYLOPRIM) 100 MG tablet Take 1 tablet (100 mg total) by mouth daily. For gout 90 tablet 0   colchicine 0.6 MG tablet Take 1 tablet (0.6 mg total) by mouth daily as needed (gout flare). 30 tablet 1   furosemide (LASIX) 40 MG tablet TAKE 1 TABLET BY MOUTH TWICE A DAY 60 tablet 2   hydrOXYzine (ATARAX/VISTARIL) 50 MG tablet Take 1 tablet (50 mg total) by mouth at bedtime as needed. 30 tablet 2   methocarbamol (ROBAXIN) 500 MG tablet Take 2 tablets (1,000 mg total) by mouth every 8 (eight) hours as needed for muscle spasms. 90 tablet 0   metoprolol succinate (TOPROL-XL) 100 MG 24 hr tablet Take 1 tablet (100 mg  total) by mouth daily. Take with or immediately following a meal. 90 tablet 3   pantoprazole (PROTONIX) 40 MG tablet Take 1 tablet (40 mg total) by mouth daily. 90 tablet 4   rivaroxaban (XARELTO) 20 MG TABS tablet TAKE 1 TABLET (20 MG TOTAL) BY MOUTH DAILY WITH SUPPER. 90 tablet 1   No current facility-administered medications for this visit.     Allergies:   Patient has no known allergies.   Social History:  The patient  reports that he quit smoking about 4 years ago. His smoking use included cigars. He has never used smokeless tobacco. He reports current alcohol use. He reports that he does not use drugs.   Family History:  The patient's family history includes Heart attack in his father; Heart disease in his father; Hypertension in his father, maternal grandmother, mother, paternal uncle, paternal uncle, paternal uncle, paternal uncle, and paternal uncle; Stroke in his father.    ROS:  Please see the history of present illness.   Otherwise, review of systems is positive for none.   All other systems are reviewed and negative.   PHYSICAL EXAM: VS:  BP (!) 118/58    Pulse (!) 47    Ht 6\' 2"  (1.88 m)    Wt (!) 321 lb (145.6 kg)    SpO2 96%    BMI 41.21 kg/m  , BMI Body mass index is 41.21 kg/m. GEN: Well nourished, well developed, in no acute distress  HEENT: normal  Neck: no JVD, carotid bruits, or masses Cardiac: RRR; no murmurs, rubs, or gallops,no edema  Respiratory:  clear to auscultation bilaterally, normal work of breathing GI: soft, nontender, nondistended, + BS MS: no deformity or atrophy  Skin: warm and dry Neuro:  Strength and sensation are intact Psych: euthymic mood, full affect  EKG:  EKG is ordered today. Personal review of the ekg ordered shows sinus rhythm, rate 47 problem he was hospitalized  Recent Labs: 07/13/2019: B Natriuretic Peptide 234.6; Magnesium 1.9 07/14/2019: TSH 1.950 01/28/2020: BUN 10; Creatinine, Ser 1.25; Hemoglobin 13.8; Platelets 251; Potassium 3.9; Sodium 139    Lipid Panel     Component Value Date/Time   CHOL 211 (H) 01/16/2018 0916   TRIG 323 (H) 01/16/2018 0916   HDL 24 (L) 01/16/2018 0916   CHOLHDL 8.8 01/16/2018 0916   VLDL 65 (H) 01/16/2018 0916   LDLCALC 122 (H) 01/16/2018 0916     Wt Readings from Last 3 Encounters:  03/22/20 (!) 321 lb (145.6 kg)  03/22/20 (!)  319 lb (144.7 kg)  01/28/20 (!) 330 lb (149.7 kg)      Other studies Reviewed: Additional studies/ records that were reviewed today include: TTE 07/14/19  Review of the above records today demonstrates:   1. Left ventricular ejection fraction, by visual estimation, is 55 to 60%. The left ventricle has normal function. There is mildly increased left ventricular hypertrophy. Appears normal systolic function, though anterior wall is not well visualized  2. Left ventricular diastolic parameters are indeterminate.  3. Global right ventricle has normal systolic function.The right ventricular size is normal. No increase in right ventricular wall thickness.  4. Left atrial size was mildly dilated.  5. Right atrial size was normal.  6. The mitral valve is normal in structure. Mild mitral valve regurgitation.  7. The tricuspid valve is normal in structure. Tricuspid valve regurgitation is not demonstrated.  8. The aortic valve is tricuspid. Aortic valve regurgitation is not visualized. No evidence of aortic valve sclerosis or stenosis.  9. The pulmonic valve was not well visualized. Pulmonic valve regurgitation is not visualized. 10. The inferior vena cava is dilated in size with >50% respiratory variability, suggesting right atrial pressure of 8 mmHg. 11. TR signal is inadequate for assessing pulmonary artery systolic pressure.   ASSESSMENT AND PLAN:  1.  Persistent atrial fibrillation: Status post ablation 04/26/2018 with repeat ablation 11/07/2019.  CHA2DS2-VASc of 1.  He is remained in sinus rhythm since his ablation.  We Ceria Suminski thus stop his Xarelto.  2.  Obstructive sleep apnea: CPAP compliance encouraged    Current medicines are reviewed at length with the patient today.   The patient does not have concerns regarding his medicines.  The following changes were made today: Stop Xarelto  Labs/ tests ordered today include:  Orders Placed This Encounter  Procedures   EKG 12-Lead      Disposition:   FU with [facility] 3 months  Signed, Jonerik Sliker Meredith Leeds, MD  03/22/2020 3:05 PM     [place] [address] [phone] (office) [phone] (fax)

## 2020-03-22 NOTE — Patient Instructions (Addendum)
For your gout I refilled your colchicine, I encourage you to return for labs to check your uric acid level once this acute attack has resolved.  For your weight gain, I encourage you to check daily weights and keep a written log, make sure your sodium levels are 1500 mg a day.  I recommend that you use my fitness pal to track your sodium levels to see where you can make small changes.  For your insomnia, I recommend that you try hydroxyzine, you can take 25 to 50 mg.  Please let us know if there is anything else we can do for you  Kennieth Rad, PA-C Physician Assistant [place] [email or web address]   Low-Sodium Eating Plan Sodium, which is an element that makes up salt, helps you maintain a healthy balance of fluids in your body. Too much sodium can increase your blood pressure and cause fluid and waste to be held in your body. Your health care provider or dietitian may recommend following this plan if you have high blood pressure (hypertension), kidney disease, liver disease, or heart failure. Eating less sodium can help lower your blood pressure, reduce swelling, and protect your heart, liver, and kidneys. What are tips for following this plan? General guidelines  Most people on this plan should limit their sodium intake to 1,500-2,000 mg (milligrams) of sodium each day. Reading food labels   The Nutrition Facts label lists the amount of sodium in one serving of the food. If you eat more than one serving, you must multiply the listed amount of sodium by the number of servings.  Choose foods with less than 140 mg of sodium per serving.  Avoid foods with 300 mg of sodium or more per serving. Shopping  Look for lower-sodium products, often labeled as "low-sodium" or "no salt added."  Always check the sodium content even if foods are labeled as "unsalted" or "no salt added".  Buy fresh foods. ? Avoid canned foods and premade  or frozen meals. ? Avoid canned, cured, or processed meats  Buy breads that have less than 80 mg of sodium per slice. Cooking  Eat more home-cooked food and less restaurant, buffet, and fast food.  Avoid adding salt when cooking. Use salt-free seasonings or herbs instead of table salt or sea salt. Check with your health care provider or pharmacist before using salt substitutes.  Cook with plant-based oils, such as canola, sunflower, or olive oil. Meal planning  When eating at a restaurant, ask that your food be prepared with less salt or no salt, if possible.  Avoid foods that contain MSG (monosodium glutamate). MSG is sometimes added to [place] food, bouillon, and some canned foods. What foods are recommended? The items listed may not be a complete list. Talk with your dietitian about what dietary choices are best for you. Grains Low-sodium cereals, including oats, puffed wheat and rice, and shredded wheat. Low-sodium crackers. Unsalted rice. Unsalted pasta. Low-sodium bread. Whole-grain breads and whole-grain pasta. Vegetables Fresh or frozen vegetables. "No salt added" canned vegetables. "No salt added" tomato sauce and paste. Low-sodium or reduced-sodium tomato and vegetable juice. Fruits Fresh, frozen, or canned fruit. Fruit juice. Meats and other protein foods Fresh or frozen (no salt added) meat, poultry, seafood, and fish. Low-sodium canned tuna and salmon. Unsalted nuts. Dried peas, beans, and lentils without added salt. Unsalted canned beans. Eggs. Unsalted nut butters. Dairy Milk. Soy milk. Cheese that is naturally low in sodium, such as ricotta cheese, fresh mozzarella, or Swiss cheese  Low-sodium or reduced-sodium cheese. Cream cheese. Yogurt. Fats and oils Unsalted butter. Unsalted margarine with no trans fat. Vegetable oils such as canola or olive oils. Seasonings and other foods Fresh and dried herbs and spices. Salt-free seasonings. Low-sodium mustard and ketchup.  Sodium-free salad dressing. Sodium-free light mayonnaise. Fresh or refrigerated horseradish. Lemon juice. Vinegar. Homemade, reduced-sodium, or low-sodium soups. Unsalted popcorn and pretzels. Low-salt or salt-free chips. What foods are not recommended? The items listed may not be a complete list. Talk with your dietitian about what dietary choices are best for you. Grains Instant hot cereals. Bread stuffing, pancake, and biscuit mixes. Croutons. Seasoned rice or pasta mixes. Noodle soup cups. Boxed or frozen macaroni and cheese. Regular salted crackers. Self-rising flour. Vegetables Sauerkraut, pickled vegetables, and relishes. Olives. [place] fries. Onion rings. Regular canned vegetables (not low-sodium or reduced-sodium). Regular canned tomato sauce and paste (not low-sodium or reduced-sodium). Regular tomato and vegetable juice (not low-sodium or reduced-sodium). Frozen vegetables in sauces. Meats and other protein foods Meat or fish that is salted, canned, smoked, spiced, or pickled. Bacon, ham, sausage, hotdogs, corned beef, chipped beef, packaged lunch meats, salt pork, jerky, pickled herring, anchovies, regular canned tuna, sardines, salted nuts. Dairy Processed cheese and cheese spreads. Cheese curds. Blue cheese. Feta cheese. String cheese. Regular cottage cheese. Buttermilk. Canned milk. Fats and oils Salted butter. Regular margarine. Ghee. Bacon fat. Seasonings and other foods Onion salt, garlic salt, seasoned salt, table salt, and sea salt. Canned and packaged gravies. Worcestershire sauce. Tartar sauce. Barbecue sauce. Teriyaki sauce. Soy sauce, including reduced-sodium. Steak sauce. Fish sauce. Oyster sauce. Cocktail sauce. Horseradish that you find on the shelf. Regular ketchup and mustard. Meat flavorings and tenderizers. Bouillon cubes. Hot sauce and Tabasco sauce. Premade or packaged marinades. Premade or packaged taco seasonings. Relishes. Regular salad dressings. Salsa. Potato and  tortilla chips. Corn chips and puffs. Salted popcorn and pretzels. Canned or dried soups. Pizza. Frozen entrees and pot pies. Summary  Eating less sodium can help lower your blood pressure, reduce swelling, and protect your heart, liver, and kidneys.  Most people on this plan should limit their sodium intake to 1,500-2,000 mg (milligrams) of sodium each day.  Canned, boxed, and frozen foods are high in sodium. Restaurant foods, fast foods, and pizza are also very high in sodium. You also get sodium by adding salt to food.  Try to cook at home, eat more fresh fruits and vegetables, and eat less fast food, canned, processed, or prepared foods. This information is not intended to replace advice given to you by your health care provider. Make sure you discuss any questions you have with your health care provider. Document Revised: 08/03/2017 Document Reviewed: 08/14/2016 Elsevier Patient Education  2020 [employer].

## 2020-03-22 NOTE — Progress Notes (Signed)
Established Patient Office Visit  Subjective:  Patient ID: Samuel Flynn, male    DOB: 07-08-1976  Age: 44 y.o. MRN: [id number]  CC:  Chief Complaint  Patient presents with  . Gout    HPI [name] reports that he started having a gout flareup in both elbows, both wrists, both knees approximately week and a half ago.  Reports that he has been taking colchicine, states that he feels he is beginning to improve.  Reports that he has been having difficulty sleeping, is able to fall asleep, wakes up several times throughout the night and is unable to fall back asleep.  Reports he is wearing his CPAP.  States that he has tried Xanax that was given to him by his uncle with relief.  Has failed melatonin.  Does endorse mind racing when he wakes up.  Endorses good sleep hygiene  Reports that he continues to have difficulty losing weight, states that he tries to not eat salt, takes Lasix on a daily basis, states that he continues to have weight gain.  Has not been weighing on a daily basis, off states that getting on the scale feels discouraging.  Denies shortness of breath   Past Medical History:  Diagnosis Date  . Atrial fibrillation with RVR ([place])   . Gastric ulcer   . Gout   . Hypertension   . Paroxysmal A-fib ([place])   . Sleep apnea    USES CPAP  . Wears glasses     Past Surgical History:  Procedure Laterality Date  . ATRIAL FIBRILLATION ABLATION N/A 04/26/2018   Procedure: ATRIAL FIBRILLATION ABLATION;  Surgeon: Constance Haw, MD;  Location: [facility];  Service: Cardiovascular;  Laterality: N/A;  . ATRIAL FIBRILLATION ABLATION N/A 11/07/2019   Procedure: ATRIAL FIBRILLATION ABLATION;  Surgeon: Constance Haw, MD;  Location: [facility];  Service: Cardiovascular;  Laterality: N/A;  . CARDIOVERSION N/A 01/18/2018   Procedure: CARDIOVERSION;  Surgeon: Larey Dresser, MD;  Location: [facility] ENDOSCOPY;  Service: Cardiovascular;  Laterality: N/A;  .  CARDIOVERSION N/A 08/19/2019   Procedure: CARDIOVERSION;  Surgeon: Thayer Headings, MD;  Location: [facility] ENDOSCOPY;  Service: Cardiovascular;  Laterality: N/A;  . ELBOW LIGAMENT RECONSTRUCTION Right 09/29/2014   Procedure: Merian Capron FRACTURE FIXATION, POSSIBLE LIGAMENT REPAIR;  Surgeon: Nita Sells, MD;  Location: [facility];  Service: Orthopedics;  Laterality: Right;  Right elbow coranoid fracture fixation, possible ligament repair  . TEE WITHOUT CARDIOVERSION N/A 01/18/2018   Procedure: TRANSESOPHAGEAL ECHOCARDIOGRAM (TEE);  Surgeon: Larey Dresser, MD;  Location: [facility] ENDOSCOPY;  Service: Cardiovascular;  Laterality: N/A;  . WISDOM TOOTH EXTRACTION      Family History  Problem Relation Age of Onset  . Hypertension Paternal Uncle   . Hypertension Maternal Grandmother   . Hypertension Paternal Uncle   . Hypertension Paternal Uncle   . Hypertension Paternal Uncle   . Hypertension Paternal Uncle   . Hypertension Mother   . Stroke Father   . Heart disease Father   . Heart attack Father   . Hypertension Father   . Colon cancer Neg Hx     Social History   Socioeconomic History  . Marital status: Single    Spouse name: Not on file  . Number of children: 4  . Years of education: 10  . Highest education level: Not on file  Occupational History  . Occupation: Truck Education administrator: [employer]  Tobacco Use  .  Smoking status: Former Smoker    Types: Cigars    Quit date: 02/03/2016    Years since quitting: 4.1  . Smokeless tobacco: Never Used  Vaping Use  . Vaping Use: Never used  Substance and Sexual Activity  . Alcohol use: Yes    Alcohol/week: 0.0 standard drinks    Comment: occ  . Drug use: No  . Sexual activity: Yes    Comment: 5 a day  Other Topics Concern  . Not on file  Social History Narrative  . Not on file   Social Determinants of Health   Financial Resource Strain:   . Difficulty of Paying Living Expenses:   Food Insecurity:    . Worried About Charity fundraiser in the Last Year:   . Arboriculturist in the Last Year:   Transportation Needs:   . Film/video editor (Medical):   Marland Kitchen Lack of Transportation (Non-Medical):   Physical Activity:   . Days of Exercise per Week:   . Minutes of Exercise per Session:   Stress:   . Feeling of Stress :   Social Connections:   . Frequency of Communication with Friends and Family:   . Frequency of Social Gatherings with Friends and Family:   . Attends Religious Services:   . Active Member of Clubs or Organizations:   . Attends Archivist Meetings:   Marland Kitchen Marital Status:   Intimate Partner Violence:   . Fear of Current or Ex-Partner:   . Emotionally Abused:   Marland Kitchen Physically Abused:   . Sexually Abused:     Outpatient Medications Prior to Visit  Medication Sig Dispense Refill  . allopurinol (ZYLOPRIM) 100 MG tablet Take 1 tablet (100 mg total) by mouth daily. For gout 90 tablet 0  . furosemide (LASIX) 40 MG tablet TAKE 1 TABLET BY MOUTH TWICE A DAY 60 tablet 2  . methocarbamol (ROBAXIN) 500 MG tablet Take 2 tablets (1,000 mg total) by mouth every 8 (eight) hours as needed for muscle spasms. 90 tablet 0  . metoprolol succinate (TOPROL-XL) 100 MG 24 hr tablet Take 1 tablet (100 mg total) by mouth daily. Take with or immediately following a meal. 90 tablet 3  . pantoprazole (PROTONIX) 40 MG tablet Take 1 tablet (40 mg total) by mouth daily. 90 tablet 4  . rivaroxaban (XARELTO) 20 MG TABS tablet TAKE 1 TABLET (20 MG TOTAL) BY MOUTH DAILY WITH SUPPER. (Patient taking differently: Take 20 mg by mouth daily. ) 90 tablet 1  . colchicine 0.6 MG tablet Take 1 tablet (0.6 mg total) by mouth daily as needed (gout flare). 30 tablet 1   No facility-administered medications prior to visit.    No Known Allergies  ROS Review of Systems  Constitutional: Positive for fatigue.  HENT: Negative.   Eyes: Negative.   Respiratory: Negative.  Negative for shortness of breath.    Cardiovascular: Negative for chest pain.  Gastrointestinal: Negative.   Endocrine: Negative.   Genitourinary: Negative.   Musculoskeletal: Positive for joint swelling.  Skin: Negative.   Allergic/Immunologic: Negative.   Neurological: Negative.   Hematological: Negative.   Psychiatric/Behavioral: Positive for sleep disturbance. Negative for self-injury and suicidal ideas.      Objective:    Physical Exam Vitals and nursing note reviewed.  Constitutional:      Appearance: Normal appearance. He is obese.  HENT:     Head: Normocephalic and atraumatic.     Right Ear: External ear normal.  Left Ear: External ear normal.     Nose: Nose normal.     Mouth/Throat:     Mouth: Mucous membranes are moist.     Pharynx: Oropharynx is clear.  Eyes:     Conjunctiva/sclera: Conjunctivae normal.     Pupils: Pupils are equal, round, and reactive to light.  Cardiovascular:     Rate and Rhythm: Normal rate and regular rhythm.     Pulses: Normal pulses.     Heart sounds: Normal heart sounds.  Pulmonary:     Effort: Pulmonary effort is normal.     Breath sounds: Normal breath sounds.  Abdominal:     General: Abdomen is flat. Bowel sounds are normal.     Palpations: Abdomen is soft.  Musculoskeletal:     Cervical back: Normal range of motion and neck supple.     Comments: Slight tender to touch both elbow joints, both knees and both wrists.  No swelling or erythema noted   Skin:    General: Skin is warm and dry.  Neurological:     General: No focal deficit present.     Mental Status: He is alert and oriented to person, place, and time.  Psychiatric:        Mood and Affect: Mood normal.        Behavior: Behavior normal.        Thought Content: Thought content normal.        Judgment: Judgment normal.     BP 114/75 (BP Location: Right Arm, Patient Position: Sitting, Cuff Size: Large)   Pulse (!) 51   Temp 98.2 F (36.8 C) (Oral)   Resp 18   Ht 6\' 2"  (1.88 m)   Wt (!) 319 lb  (144.7 kg)   SpO2 97%   BMI 40.96 kg/m  Wt Readings from Last 3 Encounters:  03/22/20 (!) 319 lb (144.7 kg)  01/28/20 (!) 330 lb (149.7 kg)  11/07/19 (!) 307 lb (139.3 kg)     Health Maintenance Due  Topic Date Due  . Hepatitis C Screening  Never done  . COVID-19 Vaccine (1) Never done    There are no preventive care reminders to display for this patient.  Lab Results  Component Value Date   TSH 1.950 07/14/2019   Lab Results  Component Value Date   WBC 7.5 01/28/2020   HGB 13.8 01/28/2020   HCT 42.2 01/28/2020   MCV 85.3 01/28/2020   PLT 251 01/28/2020   Lab Results  Component Value Date   NA 139 01/28/2020   K 3.9 01/28/2020   CO2 24 01/28/2020   GLUCOSE 106 (H) 01/28/2020   BUN 10 01/28/2020   CREATININE 1.25 (H) 01/28/2020   BILITOT 1.5 (H) 01/18/2017   ALKPHOS 55 01/18/2017   AST 17 01/18/2017   ALT 16 (L) 01/18/2017   PROT 7.2 01/18/2017   ALBUMIN 4.1 01/18/2017   CALCIUM 8.7 (L) 01/28/2020   ANIONGAP 10 01/28/2020   Lab Results  Component Value Date   CHOL 211 (H) 01/16/2018   Lab Results  Component Value Date   HDL 24 (L) 01/16/2018   Lab Results  Component Value Date   LDLCALC 122 (H) 01/16/2018   Lab Results  Component Value Date   TRIG 323 (H) 01/16/2018   Lab Results  Component Value Date   CHOLHDL 8.8 01/16/2018   Lab Results  Component Value Date   HGBA1C 5.8 (H) 08/11/2019      Assessment & Plan:   Problem List  Items Addressed This Visit      Other   Gout - Primary   Relevant Medications   colchicine 0.6 MG tablet   Other Relevant Orders   Uric Acid    Other Visit Diagnoses    Insomnia, unspecified type       Relevant Medications   hydrOXYzine (ATARAX/VISTARIL) 50 MG tablet    1. Idiopathic gout, unspecified chronicity, unspecified site Return to clinic after results of gout attack to evaluate uric acid levels. - Uric Acid; Future - colchicine 0.6 MG tablet; Take 1 tablet (0.6 mg total) by mouth daily as  needed (gout flare).  Dispense: 30 tablet; Refill: 1  2. Insomnia, unspecified type Trial hydroxyzine at bedtime - hydrOXYzine (ATARAX/VISTARIL) 50 MG tablet; Take 1 tablet (50 mg total) by mouth at bedtime as needed.  Dispense: 30 tablet; Refill: 2  Has appointment with cardiology today, encouraged him to review Lasix and his complaint of weight gain and inability to lose weight.  Patient had requested increase in his Lasix.   I have reviewed the patient's medical history (PMH, PSH, Social History, Family History, Medications, and allergies) , and have been updated if relevant. I spent 30 minutes reviewing chart and  face to face time with patient.      Meds ordered this encounter  Medications  . colchicine 0.6 MG tablet    Sig: Take 1 tablet (0.6 mg total) by mouth daily as needed (gout flare).    Dispense:  30 tablet    Refill:  1    Order Specific Question:   Supervising Provider    Answer:   Joya Gaskins, PATRICK E [[id number]]  . hydrOXYzine (ATARAX/VISTARIL) 50 MG tablet    Sig: Take 1 tablet (50 mg total) by mouth at bedtime as needed.    Dispense:  30 tablet    Refill:  2    Order Specific Question:   Supervising Provider    Answer:   Noralyn Pick    Follow-up: Return in about 6 weeks (around 05/03/2020) for with Dr. Juleen [place] at [place].    Loraine Grip Mayers, PA-C

## 2020-03-22 NOTE — Patient Instructions (Signed)
Medication Instructions:  1. STOP Xarelto  *If you need a refill on your cardiac medications before your next appointment, please call your pharmacy*   Lab Work: None ordered If you have labs (blood work) drawn today and your tests are completely normal, you will receive your results only by: Marland Kitchen MyChart Message (if you have MyChart) OR . A paper copy in the mail If you have any lab test that is abnormal or we need to change your treatment, we will call you to review the results.   Testing/Procedures: None ordered   Follow-Up: At [facility], you and your health needs are our priority.  As part of our continuing mission to provide you with exceptional heart care, we have created designated Provider Care Teams.  These Care Teams include your primary Cardiologist (physician) and Advanced Practice Providers (APPs -  Physician Assistants and Nurse Practitioners) who all work together to provide you with the care you need, when you need it.  We recommend signing up for the patient portal called "MyChart".  Sign up information is provided on this After Visit Summary.  MyChart is used to connect with patients for Virtual Visits (Telemedicine).  Patients are able to view lab/test results, encounter notes, upcoming appointments, etc.  Non-urgent messages can be sent to your provider as well.   To learn more about what you can do with MyChart, go to [email or web address].    Your next appointment:   3 month(s)  The format for your next appointment:   In Person  Provider:   Allegra Lai, MD   Thank you for choosing [facility]!!   Trinidad Curet, RN [phone]    Other Instructions

## 2020-05-04 ENCOUNTER — Telehealth (INDEPENDENT_AMBULATORY_CARE_PROVIDER_SITE_OTHER): Payer: PRIVATE HEALTH INSURANCE | Admitting: Internal Medicine

## 2020-05-04 DIAGNOSIS — M1000 Idiopathic gout, unspecified site: Secondary | ICD-10-CM

## 2020-05-04 DIAGNOSIS — G4700 Insomnia, unspecified: Secondary | ICD-10-CM | POA: Diagnosis not present

## 2020-05-04 MED ORDER — TRAZODONE HCL 50 MG PO TABS: 25.0000 mg | ORAL_TABLET | Freq: Every evening | ORAL | 3 refills | Status: DC | PRN

## 2020-05-04 NOTE — Progress Notes (Signed)
Virtual Visit via Telephone Note  I connected with Samuel Flynn, on 05/04/2020 at 9:46 AM by telephone due to the COVID-19 pandemic and verified that I am speaking with the correct person using two identifiers.   Consent: I discussed the limitations, risks, security and privacy concerns of performing an evaluation and management service by telephone and the availability of in person appointments. I also discussed with the patient that there may be a patient responsible charge related to this service. The patient expressed understanding and agreed to proceed.   Location of Patient: Home   Location of Provider: Clinic    Persons participating in Telemedicine visit: Serigne Kubicek [facility] Dr. Juleen [place]      History of Present Illness: Patient has a visit to follow up. Reports that his gout is well controlled with Allopurinol. Has not needed to take Colchicine recently.   Reports concerns about insomnia. Has been occurring for the past couple months. Having trouble staying asleep. Will sleep for about 2 hours and then will be awake. Takes about 1-1.5 hours to fall back asleep. Stays in bed during that time. No TV on in the room. Goes to bed around 6 pm. Finds himself waking around 8 pm. Gets up around 1 am to go to work. Works until around 1-2 pm most days. Has tried Hydroxyzine to aid with sleep. Didn't seem to help much.    Past Medical History:  Diagnosis Date  . Atrial fibrillation with RVR ([place])   . Gastric ulcer   . Gout   . Hypertension   . Paroxysmal A-fib ([place])   . Sleep apnea    USES CPAP  . Wears glasses    No Known Allergies  Current Outpatient Medications on File Prior to Visit  Medication Sig Dispense Refill  . allopurinol (ZYLOPRIM) 100 MG tablet Take 1 tablet (100 mg total) by mouth daily. For gout 90 tablet 0  . colchicine 0.6 MG tablet Take 1 tablet (0.6 mg total) by mouth daily as needed (gout flare). 30 tablet 1  . furosemide (LASIX) 40 MG  tablet TAKE 1 TABLET BY MOUTH TWICE A DAY 60 tablet 2  . hydrOXYzine (ATARAX/VISTARIL) 50 MG tablet Take 1 tablet (50 mg total) by mouth at bedtime as needed. 30 tablet 2  . methocarbamol (ROBAXIN) 500 MG tablet Take 2 tablets (1,000 mg total) by mouth every 8 (eight) hours as needed for muscle spasms. 90 tablet 0  . metoprolol succinate (TOPROL-XL) 100 MG 24 hr tablet Take 1 tablet (100 mg total) by mouth daily. Take with or immediately following a meal. 90 tablet 3  . pantoprazole (PROTONIX) 40 MG tablet Take 1 tablet (40 mg total) by mouth daily. 90 tablet 4   No current facility-administered medications on file prior to visit.    Observations/Objective: NAD. Speaking clearly.  Work of breathing normal.  Alert and oriented. Mood appropriate.   Assessment and Plan: 1. Idiopathic gout, unspecified chronicity, unspecified site Well controlled. Continue current regimen.   2. Insomnia, unspecified type Suspect some shift work disturbance of sleep pattern. Discussed ideal sleep environment of dark, cool room, no screens in bedroom, and potential use of white noise app/machine. Will trial Trazodone. Discussed potential side effects and appropriate use. Other sleep hygiene tactics reviewed.  - traZODone (DESYREL) 50 MG tablet; Take 0.5-1 tablets (25-50 mg total) by mouth at bedtime as needed for sleep.  Dispense: 30 tablet; Refill: 3   Follow Up Instructions: PRN and for routine medical care  I discussed the assessment and treatment plan with the patient. The patient was provided an opportunity to ask questions and all were answered. The patient agreed with the plan and demonstrated an understanding of the instructions.   The patient was advised to call back or seek an in-person evaluation if the symptoms worsen or if the condition fails to improve as anticipated.     I provided 16 minutes total of non-face-to-face time during this encounter including median intraservice time, reviewing  previous notes, investigations, ordering medications, medical decision making, coordinating care and patient verbalized understanding at the end of the visit.    Phill Myron, D.O. Primary Care at [facility]  05/04/2020, 9:46 AM

## 2020-05-12 ENCOUNTER — Other Ambulatory Visit: Payer: Self-pay | Admitting: Medical

## 2020-06-09 ENCOUNTER — Other Ambulatory Visit: Payer: Self-pay

## 2020-06-09 MED ORDER — ALLOPURINOL 100 MG PO TABS: 100.0000 mg | ORAL_TABLET | Freq: Every day | ORAL | 0 refills | Status: DC

## 2020-06-22 ENCOUNTER — Ambulatory Visit: Payer: PRIVATE HEALTH INSURANCE | Admitting: Cardiology

## 2020-06-22 NOTE — Progress Notes (Deleted)
Electrophysiology Office Note   Date:  06/22/2020   ID:  Samuel Flynn, DOB 11-Oct-1975, MRN [id number]  PCP:  Scot Jun, FNP  Cardiologist:  Oval Linsey Primary Electrophysiologist:  Darris Staiger Meredith Leeds, MD    Chief Complaint: AF   History of Present Illness: Samuel Flynn is a 44 y.o. male who is being seen today for the evaluation of AF at the request of Scot Jun, FNP. Presenting today for electrophysiology evaluation.  The has a history of hypertension, obesity, OSA on CPAP, CKD, and paroxysmal atrial fibrillation.  He is status post AF ablation 04/26/2018.  He continues to have atrial fibrillation with symptoms of weakness, fatigue, and shortness of breath.  He is now status post repeat ablation 11/07/2019.  Today, denies symptoms of palpitations, chest pain, shortness of breath, orthopnea, PND, lower extremity edema, claudication, dizziness, presyncope, syncope, bleeding, or neurologic sequela. The patient is tolerating medications without difficulties. ***    Past Medical History:  Diagnosis Date   Atrial fibrillation with RVR (HCC)    Gastric ulcer    Gout    Hypertension    Paroxysmal A-fib (HCC)    Sleep apnea    USES CPAP   Wears glasses    Past Surgical History:  Procedure Laterality Date   ATRIAL FIBRILLATION ABLATION N/A 04/26/2018   Procedure: ATRIAL FIBRILLATION ABLATION;  Surgeon: Constance Haw, MD;  Location: [place] [facility];  Service: Cardiovascular;  Laterality: N/A;   ATRIAL FIBRILLATION ABLATION N/A 11/07/2019   Procedure: ATRIAL FIBRILLATION ABLATION;  Surgeon: Constance Haw, MD;  Location: [place] [facility];  Service: Cardiovascular;  Laterality: N/A;   CARDIOVERSION N/A 01/18/2018   Procedure: CARDIOVERSION;  Surgeon: Larey Dresser, MD;  Location: [facility];  Service: Cardiovascular;  Laterality: N/A;   CARDIOVERSION N/A 08/19/2019   Procedure: CARDIOVERSION;  Surgeon: Thayer Headings, MD;   Location: [place];  Service: Cardiovascular;  Laterality: N/A;   ELBOW LIGAMENT RECONSTRUCTION Right 09/29/2014   Procedure: Merian Capron FRACTURE FIXATION, POSSIBLE LIGAMENT REPAIR;  Surgeon: Nita Sells, MD;  Location: [place];  Service: Orthopedics;  Laterality: Right;  Right elbow coranoid fracture fixation, possible ligament repair   TEE WITHOUT CARDIOVERSION N/A 01/18/2018   Procedure: TRANSESOPHAGEAL ECHOCARDIOGRAM (TEE);  Surgeon: Larey Dresser, MD;  Location[facility] END[facility]COPY;  Service: Cardiovascular;  Laterality: N/A;   WISDOM TOOTH EXTRACTION       Current Outpatient Medications  Medication Sig Dispense Refill   allopurinol (ZYLOPRIM) 100 MG tablet Take 1 tablet (100 mg total) by mouth daily. For gout 90 tablet 0   colchicine 0.6 MG tablet Take 1 tablet (0.6 mg total) by mouth daily as needed (gout flare). 30 tablet 1   furosemide (LASIX) 40 MG tablet TAKE 1 TABLET BY MOUTH TWICE A DAY 60 tablet 2   hydrOXYzine (ATARAX/VISTARIL) 50 MG tablet Take 1 tablet (50 mg total) by mouth at bedtime as needed. 30 tablet 2   metoprolol succinate (TOPROL-XL) 100 MG 24 hr tablet Take 1 tablet (100 mg total) by mouth daily. Take with or immediately following a meal. 90 tablet 3   pantoprazole (PROTONIX) 40 MG tablet Take 1 tablet (40 mg total) by mouth daily. 90 tablet 4   traZODone (DESYREL) 50 MG tablet Take 0.5-1 tablets (25-50 mg total) by mouth at bedtime as needed for sleep. 30 tablet 3   XARELTO 20 MG TABS tablet SMARTSIG:1 Tablet(s) By Mouth Every Evening     No current facility-administered  medications for this visit.    Allergies:   Patient has no known allergies.   Social History:  The patient  reports that he quit smoking about 4 years ago. His smoking use included cigars. He has never used smokeless tobacco. He reports current alcohol use. He reports that he does not use drugs.   Family History:  The patient's family history includes Heart  attack in his father; Heart disease in his father; Hypertension in his father, maternal grandmother, mother, paternal uncle, paternal uncle, paternal uncle, paternal uncle, and paternal uncle; Stroke in his father.   ROS:  Please see the history of present illness.   Otherwise, review of systems is positive for none.   All other systems are reviewed and negative.   PHYSICAL EXAM: VS:  There were no vitals taken for this visit. , BMI There is no height or weight on file to calculate BMI. GEN: Well nourished, well developed, in no acute distress  HEENT: normal  Neck: no JVD, carotid bruits, or masses Cardiac: ***RRR; no murmurs, rubs, or gallops,no edema  Respiratory:  clear to auscultation bilaterally, normal work of breathing GI: soft, nontender, nondistended, + BS MS: no deformity or atrophy  Skin: warm and dry Neuro:  Strength and sensation are intact Psych: euthymic mood, full affect  EKG:  EKG {ACTION; IS/IS [id number]} ordered today. Personal review of the ekg ordered *** shows ***   Recent Labs: 07/13/2019: B Natriuretic Peptide 234.6; Magnesium 1.9 07/14/2019: TSH 1.950 01/28/2020: BUN 10; Creatinine, Ser 1.25; Hemoglobin 13.8; Platelets 251; Potassium 3.9; Sodium 139    Lipid Panel     Component Value Date/Time   CHOL 211 (H) 01/16/2018 0916   TRIG 323 (H) 01/16/2018 0916   HDL 24 (L) 01/16/2018 0916   CHOLHDL 8.8 01/16/2018 0916   VLDL 65 (H) 01/16/2018 0916   LDLCALC 122 (H) 01/16/2018 0916     Wt Readings from Last 3 Encounters:  03/22/20 (!) 321 lb (145.6 kg)  03/22/20 (!) 319 lb (144.7 kg)  01/28/20 (!) 330 lb (149.7 kg)      Other studies Reviewed: Additional studies/ records that were reviewed today include: TTE 07/14/19  Review of the above records today demonstrates:   1. Left ventricular ejection fraction, by visual estimation, is 55 to 60%. The left ventricle has normal function. There is mildly increased left ventricular hypertrophy. Appears normal  systolic function, though anterior wall is not well visualized  2. Left ventricular diastolic parameters are indeterminate.  3. Global right ventricle has normal systolic function.The right ventricular size is normal. No increase in right ventricular wall thickness.  4. Left atrial size was mildly dilated.  5. Right atrial size was normal.  6. The mitral valve is normal in structure. Mild mitral valve regurgitation.  7. The tricuspid valve is normal in structure. Tricuspid valve regurgitation is not demonstrated.  8. The aortic valve is tricuspid. Aortic valve regurgitation is not visualized. No evidence of aortic valve sclerosis or stenosis.  9. The pulmonic valve was not well visualized. Pulmonic valve regurgitation is not visualized. 10. The inferior vena cava is dilated in size with >50% respiratory variability, suggesting right atrial pressure of 8 mmHg. 11. TR signal is inadequate for assessing pulmonary artery systolic pressure.   ASSESSMENT AND PLAN:  1.  Persistent atrial fibrillation: Status post ablation 04/26/2018 with repeat ablation 11/07/2019.  CHA2DS2-VASc of one is not anticoagulated.  ***  2.  Obstructive sleep apnea: CPAP compliance encouraged    Current medicines are  reviewed at length with the patient today.   The patient does not have concerns regarding his medicines.  The following changes were made today: ***  Labs/ tests ordered today include:  No orders of the defined types were placed in this encounter.    Disposition:   FU with Bradin Mcadory *** months  Signed, Juanantonio Stolar Meredith Leeds, MD  06/22/2020 11:09 AM     [facility] [address] [place] [address] [phone] (office) [phone] (fax)

## 2020-07-14 ENCOUNTER — Emergency Department (HOSPITAL_COMMUNITY): Payer: No Typology Code available for payment source

## 2020-07-14 ENCOUNTER — Emergency Department (HOSPITAL_COMMUNITY)
Admission: EM | Admit: 2020-07-14 | Discharge: 2020-07-14 | Disposition: A | Discharge status: Home / Self Care | Payer: No Typology Code available for payment source | Attending: Emergency Medicine | Admitting: Emergency Medicine

## 2020-07-14 ENCOUNTER — Other Ambulatory Visit: Payer: Self-pay

## 2020-07-14 DIAGNOSIS — I480 Paroxysmal atrial fibrillation: Secondary | ICD-10-CM | POA: Insufficient documentation

## 2020-07-14 DIAGNOSIS — R1012 Left upper quadrant pain: Secondary | ICD-10-CM

## 2020-07-14 DIAGNOSIS — Z7901 Long term (current) use of anticoagulants: Secondary | ICD-10-CM | POA: Insufficient documentation

## 2020-07-14 DIAGNOSIS — R339 Retention of urine, unspecified: Secondary | ICD-10-CM | POA: Insufficient documentation

## 2020-07-14 DIAGNOSIS — Z79899 Other long term (current) drug therapy: Secondary | ICD-10-CM | POA: Diagnosis not present

## 2020-07-14 DIAGNOSIS — I5033 Acute on chronic diastolic (congestive) heart failure: Secondary | ICD-10-CM | POA: Diagnosis not present

## 2020-07-14 DIAGNOSIS — I110 Hypertensive heart disease with heart failure: Secondary | ICD-10-CM | POA: Diagnosis not present

## 2020-07-14 DIAGNOSIS — R112 Nausea with vomiting, unspecified: Secondary | ICD-10-CM | POA: Insufficient documentation

## 2020-07-14 LAB — CBC
HCT: 43.7 % (ref 39.0–52.0)
Hemoglobin: 13.8 g/dL (ref 13.0–17.0)
MCH: 27 pg (ref 26.0–34.0)
MCHC: 31.6 g/dL (ref 30.0–36.0)
MCV: 85.5 fL (ref 80.0–100.0)
Platelets: 249 10*3/uL (ref 150–400)
RBC: 5.11 MIL/uL (ref 4.22–5.81)
RDW: 18.4 % — ABNORMAL HIGH (ref 11.5–15.5)
WBC: 6.6 10*3/uL (ref 4.0–10.5)
nRBC: 0 % (ref 0.0–0.2)

## 2020-07-14 LAB — BASIC METABOLIC PANEL
Anion gap: 9 (ref 5–15)
BUN: 16 mg/dL (ref 6–20)
CO2: 24 mmol/L (ref 22–32)
Calcium: 8.8 mg/dL — ABNORMAL LOW (ref 8.9–10.3)
Chloride: 109 mmol/L (ref 98–111)
Creatinine, Ser: 1.21 mg/dL (ref 0.61–1.24)
GFR, Estimated: >60 mL/min (ref >60)
Glucose, Bld: 146 mg/dL — ABNORMAL HIGH (ref 70–99)
Potassium: 4.3 mmol/L (ref 3.5–5.1)
Sodium: 142 mmol/L (ref 135–145)

## 2020-07-14 LAB — LIPASE, BLOOD: Lipase: 48 U/L (ref 11–51)

## 2020-07-14 LAB — URINALYSIS, ROUTINE W REFLEX MICROSCOPIC
Bacteria, UA: NONE SEEN
Bilirubin Urine: NEGATIVE
Glucose, UA: NEGATIVE mg/dL
Hgb urine dipstick: NEGATIVE
Ketones, ur: NEGATIVE mg/dL
Leukocytes,Ua: NEGATIVE
Nitrite: NEGATIVE
Protein, ur: 30 mg/dL — AB
Specific Gravity, Urine: 1.02 (ref 1.005–1.030)
pH: 5 (ref 5.0–8.0)

## 2020-07-14 LAB — HEPATIC FUNCTION PANEL
ALT: 17 U/L (ref 0–44)
AST: 13 U/L — ABNORMAL LOW (ref 15–41)
Albumin: 3.6 g/dL (ref 3.5–5.0)
Alkaline Phosphatase: 81 U/L (ref 38–126)
Bilirubin, Direct: 0.2 mg/dL (ref 0.0–0.2)
Indirect Bilirubin: 1.2 mg/dL — ABNORMAL HIGH (ref 0.3–0.9)
Total Bilirubin: 1.4 mg/dL — ABNORMAL HIGH (ref 0.3–1.2)
Total Protein: 7 g/dL (ref 6.5–8.1)

## 2020-07-14 MED ORDER — HYDROMORPHONE HCL 1 MG/ML IJ SOLN
1.0000 mg | Freq: Once | INTRAMUSCULAR | Status: AC
  Administered 2020-07-14: 1 mg via INTRAVENOUS
  Filled 2020-07-14: qty 1

## 2020-07-14 MED ORDER — SUCRALFATE 1 GM/10ML PO SUSP: 1.0000 g | Freq: Three times a day (TID) | ORAL | 0 refills | Status: DC

## 2020-07-14 MED ORDER — SUCRALFATE 1 GM/10ML PO SUSP
1.0000 g | Freq: Once | ORAL | Status: AC
  Administered 2020-07-14: 1 g via ORAL
  Filled 2020-07-14: qty 10

## 2020-07-14 MED ORDER — MORPHINE SULFATE (PF) 4 MG/ML IV SOLN
4.0000 mg | Freq: Once | INTRAVENOUS | Status: AC
  Administered 2020-07-14: 4 mg via INTRAVENOUS
  Filled 2020-07-14: qty 1

## 2020-07-14 MED ORDER — IOHEXOL 300 MG/ML  SOLN
100.0000 mL | Freq: Once | INTRAMUSCULAR | Status: AC | PRN
  Administered 2020-07-14: 100 mL via INTRAVENOUS

## 2020-07-14 NOTE — ED Triage Notes (Signed)
Pt with L flank pain x 1 month. Endorses urinary frequency but is unable to empty his bladder.

## 2020-07-14 NOTE — ED Notes (Signed)
Pt returned from CT °

## 2020-07-14 NOTE — ED Provider Notes (Signed)
Care assumed from [facility], [place]. See her note for full H&P.   Per her note, "Samuel Flynn is a 44 y.o. male with PMHx HTN, A fib on Xarelto, Sleep apnea who presents to the ED today with complaint of gradual onset, constant, stabbing, left flank/LUQ abdominal pain x 1 month. Pt reports the pain subsided for 2 days however returned and has been persistent since. He also complains of nausea. He reports some difficulty urinating however reports it has been an ongoing issue prior to this pain beginning - family history of prostate cancer; pt has never had his prostate examined. He also has history of kidney stones and states this feels similar however his kidney stone pain does not typically last thing long. Pt has been taking Ibuprofen at home without relief. He does mention drinking approximately 6 beers every weekend. No hx of pancreatitis. Denies fevers, chills, chest pain, SOB, vomiting, diarrhea, constipation, dysuria, urinary frequency, hematuria, or any other associated symptoms.   The history is provided by the patient and medical records. "  Physical Exam  BP 122/65   Pulse (!) 51   Temp 98.2 F (36.8 C) (Oral)   Resp 16   SpO2 96%   Physical Exam Constitutional:      General: He is not in acute distress.    Appearance: He is well-developed.  Eyes:     Conjunctiva/sclera: Conjunctivae normal.  Cardiovascular:     Rate and Rhythm: Normal rate and regular rhythm.  Pulmonary:     Effort: Pulmonary effort is normal.     Breath sounds: Normal breath sounds.  Abdominal:     General: Bowel sounds are normal.     Palpations: Abdomen is soft.     Tenderness: There is abdominal tenderness (LUQ).  Skin:    General: Skin is warm and dry.  Neurological:     Mental Status: He is alert and oriented to person, place, and time.      ED Course/Procedures     Procedures  Results for orders placed or performed during the hospital encounter of 07/14/20  Urinalysis, Routine w  reflex microscopic  Result Value Ref Range   Color, Urine YELLOW YELLOW   APPearance CLEAR CLEAR   Specific Gravity, Urine 1.020 1.005 - 1.030   pH 5.0 5.0 - 8.0   Glucose, UA NEGATIVE NEGATIVE mg/dL   Hgb urine dipstick NEGATIVE NEGATIVE   Bilirubin Urine NEGATIVE NEGATIVE   Ketones, ur NEGATIVE NEGATIVE mg/dL   Protein, ur 30 (A) NEGATIVE mg/dL   Nitrite NEGATIVE NEGATIVE   Leukocytes,Ua NEGATIVE NEGATIVE   RBC / HPF 0-5 0 - 5 RBC/hpf   WBC, UA 0-5 0 - 5 WBC/hpf   Bacteria, UA NONE SEEN NONE SEEN   Mucus PRESENT   Basic metabolic panel  Result Value Ref Range   Sodium 142 135 - 145 mmol/L   Potassium 4.3 3.5 - 5.1 mmol/L   Chloride 109 98 - 111 mmol/L   CO2 24 22 - 32 mmol/L   Glucose, Bld 146 (H) 70 - 99 mg/dL   BUN 16 6 - 20 mg/dL   Creatinine, Ser 1.21 0.61 - 1.24 mg/dL   Calcium 8.8 (L) 8.9 - 10.3 mg/dL   GFR, Estimated >60 >60 mL/min   Anion gap 9 5 - 15  CBC  Result Value Ref Range   WBC 6.6 4.0 - 10.5 K/uL   RBC 5.11 4.22 - 5.81 MIL/uL   Hemoglobin 13.8 13.0 - 17.0 g/dL   HCT  43.7 39 - 52 %   MCV 85.5 80.0 - 100.0 fL   MCH 27.0 26.0 - 34.0 pg   MCHC 31.6 30.0 - 36.0 g/dL   RDW 18.4 (H) 11.5 - 15.5 %   Platelets 249 150 - 400 K/uL   nRBC 0.0 0.0 - 0.2 %  Hepatic function panel  Result Value Ref Range   Total Protein 7.0 6.5 - 8.1 g/dL   Albumin 3.6 3.5 - 5.0 g/dL   AST 13 (L) 15 - 41 U/L   ALT 17 0 - 44 U/L   Alkaline Phosphatase 81 38 - 126 U/L   Total Bilirubin 1.4 (H) 0.3 - 1.2 mg/dL   Bilirubin, Direct 0.2 0.0 - 0.2 mg/dL   Indirect Bilirubin 1.2 (H) 0.3 - 0.9 mg/dL  Lipase, blood  Result Value Ref Range   Lipase 48 11 - 51 U/L   CT Abdomen Pelvis W Contrast  Result Date: 07/14/2020 CLINICAL DATA:  Left upper quadrant pain. EXAM: CT ABDOMEN AND PELVIS WITH CONTRAST TECHNIQUE: Multidetector CT imaging of the abdomen and pelvis was performed using the standard protocol following bolus administration of intravenous contrast. CONTRAST:  121mL  OMNIPAQUE IOHEXOL 300 MG/ML  SOLN COMPARISON:  CT AP 01/18/2017 FINDINGS: Lower chest: No acute abnormality. Hepatobiliary: No focal liver abnormality is seen. No gallstones, gallbladder wall thickening, or biliary dilatation. Pancreas: Mild diffuse hepatic steatosis. There is no focal liver abnormality identified. Gallbladder is unremarkable. No bile duct dilatation. Spleen: Normal in size without focal abnormality. Adrenals/Urinary Tract: Adrenal glands are unremarkable. Kidneys are normal, without renal calculi, focal lesion, or hydronephrosis. Bladder is unremarkable. Stomach/Bowel: Stomach is within normal limits. Appendix appears normal. No evidence of bowel wall thickening, distention, or inflammatory changes. Scattered colonic diverticula noted without signs of acute diverticulitis. Vascular/Lymphatic: Mild aortic atherosclerosis. No abdominopelvic adenopathy. Reproductive: Prostate is unremarkable. Other: No free fluid or fluid collections. Musculoskeletal: No acute or significant osseous findings. IMPRESSION: 1. No acute findings within the abdomen or pelvis. 2. Hepatic steatosis. 3. Aortic atherosclerosis. Aortic Atherosclerosis (ICD10-I70.0). Electronically Signed   By: Kerby Moors M.D.   On: 07/14/2020 15:52     MDM   44 y/o M presenting for 1 month h/o left flank and luq abd pain x1 month.   Reviewed/interpreted labs  CBC, BMP, liver enzymes, lipase, UA are reassuring.   At shift change, pending CT abd/pelvis which showed;  1. No acute findings within the abdomen or pelvis. 2. Hepatic steatosis. 3. Aortic atherosclerosis. Aortic Atherosclerosis  Reassessed patient.  He states his pain is somewhat improved after receiving medications here in the ED but is not completely resolved.  Discussed findings of work-up and imaging which did not show any acute findings.  He does state that he has a history of PUD and this feels somewhat similar.  He is on Protonix at home.  Discussed likelihood  that symptoms could be related to recurrent PUD.  We will also give Rx for Carafate for home give referral to GI.  Advised on close monitoring and follow-up.  Discussed return precautions.  He voiced understanding of plan reasons to return.  All Questions answered.  Patient stable for discharge.    Bishop Dublin 07/14/20 1650    Isla Pence, MD 07/14/20 Virl Cagey

## 2020-07-14 NOTE — Discharge Instructions (Signed)
Take carafate as prescribed. Try to decrease your use of ibuprofen. You can use tylenol instead. Continue taking your protonix.   You were given a referral to a gastroenterologist. Please call the office the schedule an appointment for follow up.   Please return to the emergency department for any new or worsening symptoms.

## 2020-07-14 NOTE — ED Provider Notes (Signed)
[facility] EMERGENCY DEPARTMENT Provider Note   CSN: [id number] Arrival date & time: 07/14/20  1048     History Chief Complaint  Patient presents with  . Flank Pain    Samuel Flynn is a 44 y.o. male with PMHx HTN, A fib on Xarelto, Sleep apnea who presents to the ED today with complaint of gradual onset, constant, stabbing, left flank/LUQ abdominal pain x 1 month. Pt reports the pain subsided for 2 days however returned and has been persistent since. He also complains of nausea. He reports some difficulty urinating however reports it has been an ongoing issue prior to this pain beginning - family history of prostate cancer; pt has never had his prostate examined. He also has history of kidney stones and states this feels similar however his kidney stone pain does not typically last thing Samuel. Pt has been taking Ibuprofen at home without relief. He does mention drinking approximately 6 beers every weekend. No hx of pancreatitis. Denies fevers, chills, chest pain, SOB, vomiting, diarrhea, constipation, dysuria, urinary frequency, hematuria, or any other associated symptoms.   The history is provided by the patient and medical records.       Past Medical History:  Diagnosis Date  . Atrial fibrillation with RVR ([place])   . Gastric ulcer   . Gout   . Hypertension   . Paroxysmal A-fib ([place])   . Sleep apnea    USES CPAP  . Wears glasses     Patient Active Problem List   Diagnosis Date Noted  . Persistent atrial fibrillation ([place])   . Acute on chronic diastolic heart failure ([place]) 07/15/2019  . Atrial flutter ([place]) 07/13/2019  . Normal coronary arteries 04/11/2019  . OSA (obstructive sleep apnea) 08/13/2018  . GAD (generalized anxiety disorder) 08/13/2018  . Acute pulmonary edema ([place]) 08/13/2018  . S/P ablation of atrial fibrillation 04/26/18 04/27/2018  . Paroxysmal atrial fibrillation ([place]) 04/26/2018  . Atrial fibrillation with RVR ([place]) 04/10/2016  .  Essential hypertension, benign 02/19/2014  . Gout 12/27/2006  . Morbid obesity ([place]) 12/27/2006    Past Surgical History:  Procedure Laterality Date  . ATRIAL FIBRILLATION ABLATION N/A 04/26/2018   Procedure: ATRIAL FIBRILLATION ABLATION;  Surgeon: Constance Haw, MD;  Location: [facility];  Service: Cardiovascular;  Laterality: N/A;  . ATRIAL FIBRILLATION ABLATION N/A 11/07/2019   Procedure: ATRIAL FIBRILLATION ABLATION;  Surgeon: Constance Haw, MD;  Location: [facility];  Service: Cardiovascular;  Laterality: N/A;  . CARDIOVERSION N/A 01/18/2018   Procedure: CARDIOVERSION;  Surgeon: Larey Dresser, MD;  Location: [facility] ENDOSCOPY;  Service: Cardiovascular;  Laterality: N/A;  . CARDIOVERSION N/A 08/19/2019   Procedure: CARDIOVERSION;  Surgeon: Thayer Headings, MD;  Location: [facility] ENDOSCOPY;  Service: Cardiovascular;  Laterality: N/A;  . ELBOW LIGAMENT RECONSTRUCTION Right 09/29/2014   Procedure: Merian Capron FRACTURE FIXATION, POSSIBLE LIGAMENT REPAIR;  Surgeon: Nita Sells, MD;  Location: [facility];  Service: Orthopedics;  Laterality: Right;  Right elbow coranoid fracture fixation, possible ligament repair  . TEE WITHOUT CARDIOVERSION N/A 01/18/2018   Procedure: TRANSESOPHAGEAL ECHOCARDIOGRAM (TEE);  Surgeon: Larey Dresser, MD;  Location: [facility] ENDOSCOPY;  Service: Cardiovascular;  Laterality: N/A;  . WISDOM TOOTH EXTRACTION         Family History  Problem Relation Age of Onset  . Hypertension Paternal Uncle   . Hypertension Maternal Grandmother   . Hypertension Paternal Uncle   . Hypertension Paternal Uncle   . Hypertension Paternal Uncle   .  Hypertension Paternal Uncle   . Hypertension Mother   . Stroke Father   . Heart disease Father   . Heart attack Father   . Hypertension Father   . Colon cancer Neg Hx     Social History   Tobacco Use  . Smoking status: Former Smoker    Types: Cigars    Quit date: 02/03/2016    Years since  quitting: 4.4  . Smokeless tobacco: Never Used  Vaping Use  . Vaping Use: Never used  Substance Use Topics  . Alcohol use: Yes    Alcohol/week: 0.0 standard drinks    Comment: occ  . Drug use: No    Home Medications Prior to Admission medications   Medication Sig Start Date End Date Taking? Authorizing Provider  allopurinol (ZYLOPRIM) 100 MG tablet Take 1 tablet (100 mg total) by mouth daily. For gout 06/09/20   Nicolette Bang, DO  colchicine 0.6 MG tablet Take 1 tablet (0.6 mg total) by mouth daily as needed (gout flare). 03/22/20   Mayers, Cari S, PA-C  furosemide (LASIX) 40 MG tablet TAKE 1 TABLET BY MOUTH TWICE A DAY 05/12/20   Furth, Cadence H, PA-C  hydrOXYzine (ATARAX/VISTARIL) 50 MG tablet Take 1 tablet (50 mg total) by mouth at bedtime as needed. 03/22/20   Mayers, Cari S, PA-C  metoprolol succinate (TOPROL-XL) 100 MG 24 hr tablet Take 1 tablet (100 mg total) by mouth daily. Take with or immediately following a meal. 08/11/19   Fulp, Cammie, MD  pantoprazole (PROTONIX) 40 MG tablet Take 1 tablet (40 mg total) by mouth daily. 08/11/19   Fulp, Cammie, MD  traZODone (DESYREL) 50 MG tablet Take 0.5-1 tablets (25-50 mg total) by mouth at bedtime as needed for sleep. 05/04/20   Nicolette Bang, DO  XARELTO 20 MG TABS tablet SMARTSIG:1 Tablet(s) By Mouth Every Evening 04/12/20   [provider]    Allergies    Patient has no known allergies.  Review of Systems   Review of Systems  Constitutional: Negative for chills and fever.  Respiratory: Negative for shortness of breath.   Cardiovascular: Negative for chest pain.  Gastrointestinal: Positive for abdominal pain and nausea. Negative for constipation, diarrhea and vomiting.  Genitourinary: Positive for difficulty urinating and flank pain. Negative for frequency.  All other systems reviewed and are negative.   Physical Exam Updated Vital Signs BP (!) 156/86 (BP Location: Left Arm)   Pulse (!) 56   Temp  97.9 F (36.6 C) (Oral)   Resp 18   SpO2 100%   Physical Exam Vitals and nursing note reviewed.  Constitutional:      Appearance: He is obese. He is not ill-appearing or diaphoretic.  HENT:     Head: Normocephalic and atraumatic.  Eyes:     Conjunctiva/sclera: Conjunctivae normal.  Cardiovascular:     Rate and Rhythm: Normal rate and regular rhythm.     Pulses: Normal pulses.  Pulmonary:     Effort: Pulmonary effort is normal.     Breath sounds: Normal breath sounds. No wheezing, rhonchi or rales.  Abdominal:     Palpations: Abdomen is soft.     Tenderness: There is abdominal tenderness. There is left CVA tenderness. There is no guarding or rebound.     Comments: Soft, + LUQ TTP and left CVA TTP, +BS throughout, no r/g/r, neg murphy's, neg mcburney's  Musculoskeletal:     Cervical back: Neck supple.  Skin:    General: Skin is warm and dry.  Neurological:     Mental Status: He is alert.     ED Results / Procedures / Treatments   Labs (all labs ordered are listed, but only abnormal results are displayed) Labs Reviewed  BASIC METABOLIC PANEL - Abnormal; Notable for the following components:      Result Value   Glucose, Bld 146 (*)    Calcium 8.8 (*)    All other components within normal limits  CBC - Abnormal; Notable for the following components:   RDW 18.4 (*)    All other components within normal limits  URINALYSIS, ROUTINE W REFLEX MICROSCOPIC    EKG None  Radiology No results found.  Procedures Procedures (including critical care time)  Medications Ordered in ED Medications - No data to display  ED Course  I have reviewed the triage vital signs and the nursing notes.  Pertinent labs & imaging results that were available during my care of the patient were reviewed by me and considered in my medical decision making (see chart for details).    MDM Rules/Calculators/A&P                          44 year old male who presents to the ED today complaining  of persistent stabbing left flank/left upper quadrant pain for the past month.  Reports history of kidney stones and states this feels similar however does not typically last a month.  Has been taking ibuprofen despite being on Xarelto for his A. fib without any improvement in his symptoms.  On arrival to the ED patient is afebrile, nontachycardic and nontachypneic.  He is clutching his left upper quadrant secondary to pain.  On exam he has left CVA tenderness palpation as well as left upper quadrant tenderness palpation.  No rebound or guarding.  No lower abdominal tenderness palpation.  Lab work was obtained while patient was in the waiting room.  CBC without leukocytosis, hemoglobin stable at 13.8.  BMP with a glucose 146, creatinine 1.21 however this appears to be patient's baseline.  Plan to add on a lipase to assess for pancreatitis, patient with history of 6 beers every weekend.  Will also add on LFTs.  Still awaiting for patient to obtain a UA however he states he feels he is able to urinate.  Morphine provided for pain.   Lab Results  Component Value Date   CREATININE 1.21 07/14/2020   CREATININE 1.25 (H) 01/28/2020   CREATININE 1.26 (H) 11/07/2019   Lipase 48 LFTs with Tbili 1.4 however this appears to be pt's baseline. No other findings.   Hepatic Function Latest Ref Rng & Units 07/14/2020 01/18/2017 04/13/2016  Total Protein 6.5 - 8.1 g/dL 7.0 7.2 7.0  Albumin 3.5 - 5.0 g/dL 3.6 4.1 2.9(L)  AST 15 - 41 U/L 13(L) 17 17  ALT 0 - 44 U/L 17 16(L) 17  Alk Phosphatase 38 - 126 U/L 81 55 79  Total Bilirubin 0.3 - 1.2 mg/dL 1.4(H) 1.5(H) 1.0  Bilirubin, Direct 0.0 - 0.2 mg/dL 0.2 - -   U/A without signs of infection or hgb to suggest kidney stone however pt still holding his LUQ in pain despite morphine. Will provide additional pain meds and obtain CT A/P to evaluate for upper sigmoid diverticulitis causing pain vs other etiology.   At shift change case signed out to oncoming team pending CT  scan.   Final Clinical Impression(s) / ED Diagnoses Final diagnoses:  None    Rx / DC Orders  ED Discharge Orders    None       Eustaquio Maize, [place] 07/15/20 1512    Tegeler, Gwenyth Allegra, MD 07/15/20 [phone]

## 2020-07-14 NOTE — ED Notes (Signed)
Patient transported to CT 

## 2020-08-11 ENCOUNTER — Other Ambulatory Visit: Payer: Self-pay | Admitting: Medical

## 2020-08-17 ENCOUNTER — Telehealth: Payer: Self-pay

## 2020-08-17 DIAGNOSIS — R002 Palpitations: Secondary | ICD-10-CM

## 2020-08-17 DIAGNOSIS — I480 Paroxysmal atrial fibrillation: Secondary | ICD-10-CM

## 2020-08-18 MED ORDER — METOPROLOL SUCCINATE ER 100 MG PO TB24: 100.0000 mg | ORAL_TABLET | Freq: Every day | ORAL | 0 refills | Status: DC

## 2020-08-18 NOTE — Telephone Encounter (Signed)
30 day supply of Metoprolol sent to pharmacy.  A 90 day supply of Allopurinol was sent to pharmacy on 06/09/2020. It is too soon for refill. If he did not pick it up in October, he needs to call pharmacy to ask them to get prescription ready.

## 2020-08-18 NOTE — Telephone Encounter (Signed)
Appointment made

## 2020-08-18 NOTE — Telephone Encounter (Signed)
Patient needs an in person appointment for BP/gout f/up

## 2020-08-25 ENCOUNTER — Encounter: Payer: Self-pay | Admitting: Family

## 2020-08-25 ENCOUNTER — Ambulatory Visit: Payer: PRIVATE HEALTH INSURANCE | Admitting: Internal Medicine

## 2020-08-25 ENCOUNTER — Other Ambulatory Visit: Payer: Self-pay

## 2020-08-25 ENCOUNTER — Ambulatory Visit (INDEPENDENT_AMBULATORY_CARE_PROVIDER_SITE_OTHER): Payer: PRIVATE HEALTH INSURANCE | Admitting: Family

## 2020-08-25 VITALS — BP 155/90 | HR 64 | Temp 98.5°F | Wt 313.2 lb

## 2020-08-25 DIAGNOSIS — R002 Palpitations: Secondary | ICD-10-CM

## 2020-08-25 DIAGNOSIS — Z23 Encounter for immunization: Secondary | ICD-10-CM | POA: Diagnosis not present

## 2020-08-25 DIAGNOSIS — M1000 Idiopathic gout, unspecified site: Secondary | ICD-10-CM | POA: Diagnosis not present

## 2020-08-25 DIAGNOSIS — I10 Essential (primary) hypertension: Secondary | ICD-10-CM

## 2020-08-25 DIAGNOSIS — I480 Paroxysmal atrial fibrillation: Secondary | ICD-10-CM | POA: Diagnosis not present

## 2020-08-25 DIAGNOSIS — R0981 Nasal congestion: Secondary | ICD-10-CM

## 2020-08-25 MED ORDER — ALLOPURINOL 100 MG PO TABS: 100.0000 mg | ORAL_TABLET | Freq: Every day | ORAL | 0 refills | Status: DC

## 2020-08-25 MED ORDER — COLCHICINE 0.6 MG PO TABS: 0.6000 mg | ORAL_TABLET | Freq: Every day | ORAL | 1 refills | Status: DC | PRN

## 2020-08-25 MED ORDER — BENZONATATE 100 MG PO CAPS: 100.0000 mg | ORAL_CAPSULE | Freq: Three times a day (TID) | ORAL | 0 refills | Status: DC | PRN

## 2020-08-25 MED ORDER — METOPROLOL SUCCINATE ER 100 MG PO TB24: 100.0000 mg | ORAL_TABLET | Freq: Every day | ORAL | 0 refills | Status: DC

## 2020-08-25 MED ORDER — CETIRIZINE HCL 10 MG PO TABS: 10.0000 mg | ORAL_TABLET | Freq: Every day | ORAL | 1 refills | Status: DC

## 2020-08-25 MED ORDER — LORATADINE 10 MG PO TABS: 10.0000 mg | ORAL_TABLET | Freq: Every day | ORAL | 0 refills | Status: DC

## 2020-08-25 NOTE — Progress Notes (Signed)
Patient ID: Samuel Flynn, male    DOB: 03/04/1976  MRN: [id number]  CC: Hypertension Follow-Up  Subjective: Samuel Flynn is a 44 y.o. male who presents for hypertension follow-up.  1. HYPERTENSION FOLLOW-UP: 08/25/2020: Currently taking: see medication list Have you taken your blood pressure medication today: []  Yes [x]  No, reports ran out of medications and needs refills  Med Adherence: [x]  Yes    []  No Medication side effects: []  Yes    [x]  No Adherence with salt restriction: [x]  Yes    []  No Exercise: Yes []  No [x]  Home Monitoring?: []  Yes    [x]  No Smoking []  Yes [x]  No SOB? [x]  Yes, sometimes Chest Pain?: [x]  Yes, sometimes Leg swelling?: []  Yes    [x]  No Headaches?: []  Yes    [x]  No Dizziness? []  Yes    [x]  No  2. ATRIAL FIBRILLATION FOLLOW-UP: 09/03/2019: Visit with physician assistant Freeman Caldron. Seen for atrial fibrillation. Continued on Amiodarone. Ablation August 2019. Plan to follow-up with Cardiology January 2021.  08/25/2020: Today says he has not followed up with Cardiology in quite some time and has no plans to follow-up anytime soon. Atrial fibrillation status: controlled Satisfied with current treatment: yes  Medication side effects:  no Medication compliance: fair compliance Etiology of atrial fibrillation:  Palpitations:  sometimes Chest pain:  sometimes  3. GOUT FOLLOW-UP: 03/22/2020: Visit with physician assistant Cari Mayers. Colchicine prescribed. Uric acid in future.  05/04/2020: Visit with Dr. Juleen China. Well controlled and continued on current regimen.   08/25/2020: Reports last gout flare was 1 week ago in both great toes and the left ankle. Reports taking Allopurinol daily and Colchicine only when having a flare. Not sure what causes gout to flare for him. Today feeling well and without flare.   4. NASAL CONGESTION: Stuffy nose began yesterday. Endorses coughing up clear-green mucous and sneezing. Using over-the-counter nasal  spray.    Patient Active Problem List   Diagnosis Date Noted  . Persistent atrial fibrillation ([place])   . Acute on chronic diastolic heart failure ([place]) 07/15/2019  . Atrial flutter ([place]) 07/13/2019  . Normal coronary arteries 04/11/2019  . OSA (obstructive sleep apnea) 08/13/2018  . GAD (generalized anxiety disorder) 08/13/2018  . Acute pulmonary edema ([place]) 08/13/2018  . S/P ablation of atrial fibrillation 04/26/18 04/27/2018  . Paroxysmal atrial fibrillation ([place]) 04/26/2018  . Atrial fibrillation with RVR ([place]) 04/10/2016  . Essential hypertension, benign 02/19/2014  . Gout 12/27/2006  . Morbid obesity ([place]) 12/27/2006     Current Outpatient Medications on File Prior to Visit  Medication Sig Dispense Refill  . furosemide (LASIX) 40 MG tablet TAKE 1 TABLET BY MOUTH TWICE A DAY 60 tablet 2  . hydrOXYzine (ATARAX/VISTARIL) 50 MG tablet Take 1 tablet (50 mg total) by mouth at bedtime as needed. 30 tablet 2  . pantoprazole (PROTONIX) 40 MG tablet Take 1 tablet (40 mg total) by mouth daily. 90 tablet 4  . sucralfate (CARAFATE) 1 GM/10ML suspension Take 10 mLs (1 g total) by mouth 4 (four) times daily -  with meals and at bedtime. 420 mL 0  . traZODone (DESYREL) 50 MG tablet Take 0.5-1 tablets (25-50 mg total) by mouth at bedtime as needed for sleep. 30 tablet 3  . XARELTO 20 MG TABS tablet SMARTSIG:1 Tablet(s) By Mouth Every Evening     No current facility-administered medications on file prior to visit.    No Known Allergies  Social History   Socioeconomic History  . Marital  status: Single    Spouse name: Not on file  . Number of children: 4  . Years of education: 10  . Highest education level: Not on file  Occupational History  . Occupation: Truck Education administrator: [employer]  Tobacco Use  . Smoking status: Former Smoker    Types: Cigars    Quit date: 02/03/2016    Years since quitting: 4.5  . Smokeless tobacco: Never Used  Vaping Use  . Vaping Use: Never used   Substance and Sexual Activity  . Alcohol use: Yes    Alcohol/week: 0.0 standard drinks    Comment: occ  . Drug use: No  . Sexual activity: Yes    Comment: 5 a day  Other Topics Concern  . Not on file  Social History Narrative  . Not on file   Social Determinants of Health   Financial Resource Strain: Not on file  Food Insecurity: Not on file  Transportation Needs: Not on file  Physical Activity: Not on file  Stress: Not on file  Social Connections: Not on file  Intimate Partner Violence: Not on file    Family History  Problem Relation Age of Onset  . Hypertension Paternal Uncle   . Hypertension Maternal Grandmother   . Hypertension Paternal Uncle   . Hypertension Paternal Uncle   . Hypertension Paternal Uncle   . Hypertension Paternal Uncle   . Hypertension Mother   . Stroke Father   . Heart disease Father   . Heart attack Father   . Hypertension Father   . Colon cancer Neg Hx     Past Surgical History:  Procedure Laterality Date  . ATRIAL FIBRILLATION ABLATION N/A 04/26/2018   Procedure: ATRIAL FIBRILLATION ABLATION;  Surgeon: Constance Haw, MD;  Location: [place] [facility];  Service: Cardiovascular;  Laterality: N/A;  . ATRIAL FIBRILLATION ABLATION N/A 11/07/2019   Procedure: ATRIAL FIBRILLATION ABLATION;  Surgeon: Constance Haw, MD;  Location: [place];  Service: Cardiovascular;  Laterality: N/A;  . CARDIOVERSION N/A 01/18/2018   Procedure: CARDIOVERSION;  Surgeon: Larey Dresser, MD;  Location: [facility] ENDOSCOPY;  Service: Cardiovascular;  Laterality: N/A;  . CARDIOVERSION N/A 08/19/2019   Procedure: CARDIOVERSION;  Surgeon: Thayer Headings, MD;  Location: [facility] ENDOSCOPY;  Service: Cardiovascular;  Laterality: N/A;  . ELBOW LIGAMENT RECONSTRUCTION Right 09/29/2014   Procedure: Merian Capron FRACTURE FIXATION, POSSIBLE LIGAMENT REPAIR;  Surgeon: Nita Sells, MD;  Location: [place] [facility];  Service: Orthopedics;  Laterality:  Right;  Right elbow coranoid fracture fixation, possible ligament repair  . TEE WITHOUT CARDIOVERSION N/A 01/18/2018   Procedure: TRANSESOPHAGEAL ECHOCARDIOGRAM (TEE);  Surgeon: Larey Dresser, MD;  Location: [facility] ENDOSCOPY;  Service: Cardiovascular;  Laterality: N/A;  . WISDOM TOOTH EXTRACTION      ROS: Review of Systems Negative except as stated above  PHYSICAL EXAM: BP (!) 155/90 (BP Location: Left Arm, Patient Position: Sitting)   Pulse 64   Temp 98.5 F (36.9 C)   Wt (!) 313 lb 3.2 oz (142.1 kg)   SpO2 94%   BMI 40.21 kg/m   Physical Exam Constitutional:      Appearance: He is obese.  HENT:     Nose: Congestion present.  Eyes:     Extraocular Movements: Extraocular movements intact.     Pupils: Pupils are equal, round, and reactive to light.  Cardiovascular:     Rate and Rhythm: Normal rate and regular rhythm.     Pulses: Normal  pulses.     Heart sounds: Normal heart sounds.  Pulmonary:     Effort: Pulmonary effort is normal.     Breath sounds: Normal breath sounds.  Neurological:     General: No focal deficit present.     Mental Status: He is alert and oriented to person, place, and time.  Psychiatric:        Mood and Affect: Mood normal.        Behavior: Behavior normal.    ASSESSMENT AND PLAN: 1. Essential hypertension, benign: - Blood pressure not at goal during today's visit. Patient asymptomatic without chest pressure, chest pain, palpitations, and shortness of breath. - Patient endorses that he has not taken blood pressure for today yet as he is waiting on refills.  - Level of blood pressure control unknown as patient does not monitor at home.  - Continue Metoprolol Succinate as prescribed. - Counseled on blood pressure goal of less than 130/80, low-sodium, DASH diet, medication compliance, moderate intensity exercise per week as tolerated. Discussed medication compliance, adverse effects. - BMP to check kidney function and electrolyte balance. Patient  will return within 1 week to the lab to have this done. - Follow-up in 2 weeks for blood pressure check with primary physician.  - Patient was given clear instructions to go to Emergency Department or return to medical center if symptoms don't improve, worsen, or new problems develop.The patient verbalized understanding. - Basic Metabolic Panel; Future - metoprolol succinate (TOPROL-XL) 100 MG 24 hr tablet; Take 1 tablet (100 mg total) by mouth daily. Take with or immediately following a meal.  Dispense: 90 tablet; Refill: 0  2. Paroxysmal atrial fibrillation ([place]): 3. Palpitations: - Visit with Cardiology 03/22/2020. Patient with persistent atrial fibrillation. Status post ablation 04/26/2018 with repeat ablation 11/07/2019. Remained in sinus rhythm since ablation. Xarelto stopped at that time. - Continue Metoprolol Succinate as prescribed.  - Continue Furosemide as prescribed per Cardiology. - Patient has not followed-up with Cardiology since last ablation. Reports has no plans to do so at this time because he feels well overall. Does have shortness of breath, chest pain, and palpitations sometimes.  - Offered referral back to Cardiology. Patient declined. - Patient was given clear instructions to go to Emergency Department or return to medical center if symptoms don't improve, worsen, or new problems develop.The patient verbalized understanding. - metoprolol succinate (TOPROL-XL) 100 MG 24 hr tablet; Take 1 tablet (100 mg total) by mouth daily. Take with or immediately following a meal.  Dispense: 90 tablet; Refill: 0  4. Idiopathic gout, unspecified chronicity, unspecified site: - Continue Allopurinol and Colchicine as prescribed.  - Follow-up with primary physician as needed. - allopurinol (ZYLOPRIM) 100 MG tablet; Take 1 tablet (100 mg total) by mouth daily. For gout  Dispense: 90 tablet; Refill: 0 - colchicine 0.6 MG tablet; Take 1 tablet (0.6 mg total) by mouth daily as needed (gout flare).   Dispense: 30 tablet; Refill: 1  5. Nasal congestion: - Patient reports nasal congestion, cough, and sneezing for 1 day.  - Loratadine and Benzonatate capsules as prescribed. - Follow-up with primary physician if symptoms worsen and/or do not improve.  - benzonatate (TESSALON PERLES) 100 MG capsule; Take 1 capsule (100 mg total) by mouth 3 (three) times daily as needed for cough.  Dispense: 20 capsule; Refill: 0 - loratadine (CLARITIN) 10 MG tablet; Take 1 tablet (10 mg total) by mouth daily.  Dispense: 30 tablet; Refill: 0  6. Need for immunization against influenza: - Flu  vaccine administered during today's visit. - Flu Vaccine QUAD 36+ mos IM   Patient was given the opportunity to ask questions.  Patient verbalized understanding of the plan and was able to repeat key elements of the plan. Patient was given clear instructions to go to Emergency Department or return to medical center if symptoms don't improve, worsen, or new problems develop.The patient verbalized understanding.   Orders Placed This Encounter  Procedures  . Flu Vaccine QUAD 36+ mos IM  . Basic Metabolic Panel     Requested Prescriptions   Signed Prescriptions Disp Refills  . benzonatate (TESSALON PERLES) 100 MG capsule 20 capsule 0    Sig: Take 1 capsule (100 mg total) by mouth 3 (three) times daily as needed for cough.  . loratadine (CLARITIN) 10 MG tablet 30 tablet 0    Sig: Take 1 tablet (10 mg total) by mouth daily.  Marland Kitchen allopurinol (ZYLOPRIM) 100 MG tablet 90 tablet 0    Sig: Take 1 tablet (100 mg total) by mouth daily. For gout  . colchicine 0.6 MG tablet 30 tablet 1    Sig: Take 1 tablet (0.6 mg total) by mouth daily as needed (gout flare).  . metoprolol succinate (TOPROL-XL) 100 MG 24 hr tablet 90 tablet 0    Sig: Take 1 tablet (100 mg total) by mouth daily. Take with or immediately following a meal.    Return in about 2 weeks (around 09/08/2020) for Dr. Juleen China.  Camillia Herter, NP

## 2020-08-25 NOTE — Patient Instructions (Addendum)
Continue Metoprolol and Furosemide for high blood pressure.   Return in 2 weeks for blood pressure check.   Continue Allopurinol and Colchicine for gout.  Cetirizine and Tessalon cough drops for congestion.   Keep all appointments as scheduled with primary physician.  Hypertension, Adult Hypertension is another name for high blood pressure. High blood pressure forces your heart to work harder to pump blood. This can cause problems over time. There are two numbers in a blood pressure reading. There is a top number (systolic) over a bottom number (diastolic). It is best to have a blood pressure that is below 120/80. Healthy choices can help lower your blood pressure, or you may need medicine to help lower it. What are the causes? The cause of this condition is not known. Some conditions may be related to high blood pressure. What increases the risk?  Smoking.  Having type 2 diabetes mellitus, high cholesterol, or both.  Not getting enough exercise or physical activity.  Being overweight.  Having too much fat, sugar, calories, or salt (sodium) in your diet.  Drinking too much alcohol.  Having long-term (chronic) kidney disease.  Having a family history of high blood pressure.  Age. Risk increases with age.  Race. You may be at higher risk if you are African American.  Gender. Men are at higher risk than women before age 58. After age 75, women are at higher risk than men.  Having obstructive sleep apnea.  Stress. What are the signs or symptoms?  High blood pressure may not cause symptoms. Very high blood pressure (hypertensive crisis) may cause: ? Headache. ? Feelings of worry or nervousness (anxiety). ? Shortness of breath. ? Nosebleed. ? A feeling of being sick to your stomach (nausea). ? Throwing up (vomiting). ? Changes in how you see. ? Very bad chest pain. ? Seizures. How is this treated?  This condition is treated by making healthy lifestyle changes, such  as: ? Eating healthy foods. ? Exercising more. ? Drinking less alcohol.  Your health care provider may prescribe medicine if lifestyle changes are not enough to get your blood pressure under control, and if: ? Your top number is above 130. ? Your bottom number is above 80.  Your personal target blood pressure may vary. Follow these instructions at home: Eating and drinking   If told, follow the DASH eating plan. To follow this plan: ? Fill one half of your plate at each meal with fruits and vegetables. ? Fill one fourth of your plate at each meal with whole grains. Whole grains include whole-wheat pasta, brown rice, and whole-grain bread. ? Eat or drink low-fat dairy products, such as skim milk or low-fat yogurt. ? Fill one fourth of your plate at each meal with low-fat (lean) proteins. Low-fat proteins include fish, chicken without skin, eggs, beans, and tofu. ? Avoid fatty meat, cured and processed meat, or chicken with skin. ? Avoid pre-made or processed food.  Eat less than 1,500 mg of salt each day.  Do not drink alcohol if: ? Your doctor tells you not to drink. ? You are pregnant, may be pregnant, or are planning to become pregnant.  If you drink alcohol: ? Limit how much you use to:  0-1 drink a day for women.  0-2 drinks a day for men. ? Be aware of how much alcohol is in your drink. In the [place] one drink equals one 12 oz bottle of beer (355 mL), one 5 oz glass of wine (148 mL),  or one 1 oz glass of hard liquor (44 mL). Lifestyle   Work with your doctor to stay at a healthy weight or to lose weight. Ask your doctor what the best weight is for you.  Get at least 30 minutes of exercise most days of the week. This may include walking, swimming, or biking.  Get at least 30 minutes of exercise that strengthens your muscles (resistance exercise) at least 3 days a week. This may include lifting weights or doing Pilates.  Do not use any products that contain nicotine or  tobacco, such as cigarettes, e-cigarettes, and chewing tobacco. If you need help quitting, ask your doctor.  Check your blood pressure at home as told by your doctor.  Keep all follow-up visits as told by your doctor. This is important. Medicines  Take over-the-counter and prescription medicines only as told by your doctor. Follow directions carefully.  Do not skip doses of blood pressure medicine. The medicine does not work as well if you skip doses. Skipping doses also puts you at risk for problems.  Ask your doctor about side effects or reactions to medicines that you should watch for. Contact a doctor if you:  Think you are having a reaction to the medicine you are taking.  Have headaches that keep coming back (recurring).  Feel dizzy.  Have swelling in your ankles.  Have trouble with your vision. Get help right away if you:  Get a very bad headache.  Start to feel mixed up (confused).  Feel weak or numb.  Feel faint.  Have very bad pain in your: ? Chest. ? Belly (abdomen).  Throw up more than once.  Have trouble breathing. Summary  Hypertension is another name for high blood pressure.  High blood pressure forces your heart to work harder to pump blood.  For most people, a normal blood pressure is less than 120/80.  Making healthy choices can help lower blood pressure. If your blood pressure does not get lower with healthy choices, you may need to take medicine. This information is not intended to replace advice given to you by your health care provider. Make sure you discuss any questions you have with your health care provider. Document Revised: 05/01/2018 Document Reviewed: 05/01/2018 Elsevier Patient Education  2020 [employer].

## 2020-08-25 NOTE — Progress Notes (Signed)
Gout flare last week  BP f/u Cough/congestion started yesterday Wants flu shot

## 2020-09-07 ENCOUNTER — Ambulatory Visit: Payer: PRIVATE HEALTH INSURANCE | Admitting: Internal Medicine

## 2020-09-29 ENCOUNTER — Other Ambulatory Visit: Payer: Self-pay

## 2020-09-29 DIAGNOSIS — R002 Palpitations: Secondary | ICD-10-CM

## 2020-09-29 DIAGNOSIS — I480 Paroxysmal atrial fibrillation: Secondary | ICD-10-CM

## 2020-09-29 DIAGNOSIS — I10 Essential (primary) hypertension: Secondary | ICD-10-CM

## 2020-09-29 MED ORDER — METOPROLOL SUCCINATE ER 100 MG PO TB24: 100.0000 mg | ORAL_TABLET | Freq: Every day | ORAL | 0 refills | Status: DC

## 2021-01-05 ENCOUNTER — Other Ambulatory Visit: Payer: Self-pay | Admitting: Family

## 2021-01-05 DIAGNOSIS — M1000 Idiopathic gout, unspecified site: Secondary | ICD-10-CM

## 2021-01-27 ENCOUNTER — Other Ambulatory Visit: Payer: Self-pay | Admitting: Medical

## 2021-03-02 ENCOUNTER — Other Ambulatory Visit: Payer: Self-pay | Admitting: Medical

## 2021-03-08 ENCOUNTER — Other Ambulatory Visit: Payer: Self-pay | Admitting: Medical

## 2021-03-08 ENCOUNTER — Telehealth: Payer: Self-pay | Admitting: Cardiology

## 2021-03-08 NOTE — Telephone Encounter (Signed)
*  STAT* If patient is at the pharmacy, call can be transferred to refill team.   1. Which medications need to be refilled? (please list name of each medication and dose if known) furosemide (LASIX) 40 MG tablet  2. Which pharmacy/location (including street and city if local pharmacy) is medication to be sent to? [place] ([place]), [place] - [address]  3. Do they need a 30 day or 90 day supply? 90 day supply  PT is out of this medication

## 2021-03-08 NOTE — Telephone Encounter (Signed)
This is Dr. [place]'s pt.  °

## 2021-03-11 MED ORDER — FUROSEMIDE 40 MG PO TABS
40.0000 mg | ORAL_TABLET | Freq: Two times a day (BID) | ORAL | 0 refills | Status: DC
Start: 2021-03-11 — End: 2021-04-06

## 2021-03-31 ENCOUNTER — Telehealth: Payer: Self-pay | Admitting: Cardiovascular Disease

## 2021-03-31 NOTE — Telephone Encounter (Signed)
Spoke to patient's wife she stated husband just woke up with very fast irregular heart beat.He is sob and light headed.No chest pain.Stated he feels faint.Advised to call 911 and go to ED.

## 2021-03-31 NOTE — Telephone Encounter (Signed)
Patient c/o Palpitations:  High priority if patient c/o lightheadedness, shortness of breath, or chest pain  How long have you had palpitations/irregular HR/ Afib? Are you having the symptoms now? Since pt woke up this morning around 10am  Are you currently experiencing lightheadedness, SOB or CP? lightheadedness  Do you have a history of afib (atrial fibrillation) or irregular heart rhythm? Yes  Have you checked your BP or HR? (document readings if available): No  Are you experiencing any other symptoms? Lightheadedness/SOB

## 2021-04-05 ENCOUNTER — Other Ambulatory Visit: Payer: Self-pay | Admitting: Cardiovascular Disease

## 2021-04-05 NOTE — Telephone Encounter (Signed)
Please call pt to schedule follow- up appointment with Dr. Curt Bears for refills. Last seen 03/2020. Thank you!

## 2021-04-10 ENCOUNTER — Other Ambulatory Visit: Payer: Self-pay | Admitting: Family

## 2021-04-10 DIAGNOSIS — R002 Palpitations: Secondary | ICD-10-CM

## 2021-04-10 DIAGNOSIS — I480 Paroxysmal atrial fibrillation: Secondary | ICD-10-CM

## 2021-04-10 DIAGNOSIS — I10 Essential (primary) hypertension: Secondary | ICD-10-CM

## 2021-04-15 ENCOUNTER — Encounter (HOSPITAL_COMMUNITY): Payer: Self-pay | Admitting: *Deleted

## 2021-04-15 ENCOUNTER — Inpatient Hospital Stay (HOSPITAL_COMMUNITY)
Admission: EM | Admit: 2021-04-15 | Discharge: 2021-04-18 | DRG: 308 | Disposition: A | Discharge status: Home / Self Care | Payer: Medicaid Other | Attending: Cardiology | Admitting: Cardiology

## 2021-04-15 ENCOUNTER — Other Ambulatory Visit: Payer: Self-pay

## 2021-04-15 ENCOUNTER — Emergency Department (HOSPITAL_COMMUNITY): Payer: Medicaid Other

## 2021-04-15 DIAGNOSIS — Z6841 Body Mass Index (BMI) 40.0 and over, adult: Secondary | ICD-10-CM

## 2021-04-15 DIAGNOSIS — M1000 Idiopathic gout, unspecified site: Secondary | ICD-10-CM

## 2021-04-15 DIAGNOSIS — I509 Heart failure, unspecified: Secondary | ICD-10-CM | POA: Diagnosis not present

## 2021-04-15 DIAGNOSIS — I5031 Acute diastolic (congestive) heart failure: Secondary | ICD-10-CM | POA: Diagnosis present

## 2021-04-15 DIAGNOSIS — Z79899 Other long term (current) drug therapy: Secondary | ICD-10-CM

## 2021-04-15 DIAGNOSIS — R002 Palpitations: Secondary | ICD-10-CM | POA: Diagnosis not present

## 2021-04-15 DIAGNOSIS — Z8711 Personal history of peptic ulcer disease: Secondary | ICD-10-CM

## 2021-04-15 DIAGNOSIS — Z8249 Family history of ischemic heart disease and other diseases of the circulatory system: Secondary | ICD-10-CM

## 2021-04-15 DIAGNOSIS — I4892 Unspecified atrial flutter: Secondary | ICD-10-CM | POA: Diagnosis present

## 2021-04-15 DIAGNOSIS — E1122 Type 2 diabetes mellitus with diabetic chronic kidney disease: Secondary | ICD-10-CM | POA: Diagnosis present

## 2021-04-15 DIAGNOSIS — M109 Gout, unspecified: Secondary | ICD-10-CM | POA: Diagnosis present

## 2021-04-15 DIAGNOSIS — Z87891 Personal history of nicotine dependence: Secondary | ICD-10-CM

## 2021-04-15 DIAGNOSIS — I4891 Unspecified atrial fibrillation: Secondary | ICD-10-CM | POA: Diagnosis present

## 2021-04-15 DIAGNOSIS — Z20822 Contact with and (suspected) exposure to covid-19: Secondary | ICD-10-CM | POA: Diagnosis present

## 2021-04-15 DIAGNOSIS — I4819 Other persistent atrial fibrillation: Principal | ICD-10-CM | POA: Diagnosis present

## 2021-04-15 DIAGNOSIS — N182 Chronic kidney disease, stage 2 (mild): Secondary | ICD-10-CM | POA: Diagnosis present

## 2021-04-15 DIAGNOSIS — G4733 Obstructive sleep apnea (adult) (pediatric): Secondary | ICD-10-CM | POA: Diagnosis present

## 2021-04-15 DIAGNOSIS — I130 Hypertensive heart and chronic kidney disease with heart failure and stage 1 through stage 4 chronic kidney disease, or unspecified chronic kidney disease: Secondary | ICD-10-CM | POA: Diagnosis present

## 2021-04-15 DIAGNOSIS — Z7901 Long term (current) use of anticoagulants: Secondary | ICD-10-CM

## 2021-04-15 LAB — CBC
HCT: 51.3 % (ref 39.0–52.0)
Hemoglobin: 17.1 g/dL — ABNORMAL HIGH (ref 13.0–17.0)
MCH: 28 pg (ref 26.0–34.0)
MCHC: 33.3 g/dL (ref 30.0–36.0)
MCV: 84 fL (ref 80.0–100.0)
Platelets: 301 10*3/uL (ref 150–400)
RBC: 6.11 MIL/uL — ABNORMAL HIGH (ref 4.22–5.81)
RDW: 18.9 % — ABNORMAL HIGH (ref 11.5–15.5)
WBC: 11.9 10*3/uL — ABNORMAL HIGH (ref 4.0–10.5)
nRBC: 0 % (ref 0.0–0.2)

## 2021-04-15 LAB — BASIC METABOLIC PANEL
Anion gap: 15 (ref 5–15)
BUN: 14 mg/dL (ref 6–20)
CO2: 20 mmol/L — ABNORMAL LOW (ref 22–32)
Calcium: 9.2 mg/dL (ref 8.9–10.3)
Chloride: 102 mmol/L (ref 98–111)
Creatinine, Ser: 1.44 mg/dL — ABNORMAL HIGH (ref 0.61–1.24)
GFR, Estimated: >60 mL/min (ref >60)
Glucose, Bld: 173 mg/dL — ABNORMAL HIGH (ref 70–99)
Potassium: 3.8 mmol/L (ref 3.5–5.1)
Sodium: 137 mmol/L (ref 135–145)

## 2021-04-15 LAB — HEPATIC FUNCTION PANEL
ALT: 22 U/L (ref 0–44)
AST: 22 U/L (ref 15–41)
Albumin: 3.5 g/dL (ref 3.5–5.0)
Alkaline Phosphatase: 76 U/L (ref 38–126)
Bilirubin, Direct: 0.2 mg/dL (ref 0.0–0.2)
Indirect Bilirubin: 0.8 mg/dL (ref 0.3–0.9)
Total Bilirubin: 1 mg/dL (ref 0.3–1.2)
Total Protein: 6.5 g/dL (ref 6.5–8.1)

## 2021-04-15 LAB — APTT: aPTT: 43 seconds — ABNORMAL HIGH (ref 24–36)

## 2021-04-15 LAB — RESP PANEL BY RT-PCR (FLU A&B, COVID) ARPGX2
Influenza A by PCR: NEGATIVE
Influenza B by PCR: NEGATIVE
SARS Coronavirus 2 by RT PCR: NEGATIVE

## 2021-04-15 LAB — HEPARIN LEVEL (UNFRACTIONATED): Heparin Unfractionated: >1.1 IU/mL — ABNORMAL HIGH (ref 0.30–0.70)

## 2021-04-15 LAB — TROPONIN I (HIGH SENSITIVITY)
Troponin I (High Sensitivity): 35 ng/L — ABNORMAL HIGH (ref <18)
Troponin I (High Sensitivity): 112 ng/L (ref <18)

## 2021-04-15 LAB — HEMOGLOBIN A1C
Hgb A1c MFr Bld: 6.9 % — ABNORMAL HIGH (ref 4.8–5.6)
Mean Plasma Glucose: 151.33 mg/dL

## 2021-04-15 LAB — MAGNESIUM: Magnesium: 2.2 mg/dL (ref 1.7–2.4)

## 2021-04-15 LAB — TSH: TSH: 1.143 u[IU]/mL (ref 0.350–4.500)

## 2021-04-15 MED ORDER — ACETAMINOPHEN 325 MG PO TABS
650.0000 mg | ORAL_TABLET | ORAL | Status: DC | PRN
  Administered 2021-04-16: 650 mg via ORAL
  Filled 2021-04-15: qty 2

## 2021-04-15 MED ORDER — SODIUM CHLORIDE 0.9% FLUSH: 3.0000 mL | INTRAVENOUS | Status: DC | PRN

## 2021-04-15 MED ORDER — HEPARIN (PORCINE) 25000 UT/250ML-% IV SOLN
2000.0000 [IU]/h | INTRAVENOUS | Status: AC
  Administered 2021-04-15: 1750 [IU]/h via INTRAVENOUS
  Administered 2021-04-16: 2000 [IU]/h via INTRAVENOUS
  Filled 2021-04-15 (×2): qty 250

## 2021-04-15 MED ORDER — SODIUM CHLORIDE 0.9% FLUSH
3.0000 mL | Freq: Two times a day (BID) | INTRAVENOUS | Status: DC
Start: 2021-04-15 — End: 2021-04-18
  Administered 2021-04-16 – 2021-04-18 (×4): 3 mL via INTRAVENOUS

## 2021-04-15 MED ORDER — PANTOPRAZOLE SODIUM 40 MG PO TBEC
40.0000 mg | DELAYED_RELEASE_TABLET | Freq: Every day | ORAL | Status: DC
  Administered 2021-04-16 – 2021-04-18 (×3): 40 mg via ORAL
  Filled 2021-04-15 (×3): qty 1

## 2021-04-15 MED ORDER — LACTATED RINGERS IV BOLUS
1000.0000 mL | Freq: Once | INTRAVENOUS | Status: AC
  Administered 2021-04-15: 1000 mL via INTRAVENOUS

## 2021-04-15 MED ORDER — ASPIRIN 81 MG PO CHEW
324.0000 mg | CHEWABLE_TABLET | Freq: Once | ORAL | Status: AC
  Administered 2021-04-15: 324 mg via ORAL
  Filled 2021-04-15: qty 4

## 2021-04-15 MED ORDER — FENTANYL CITRATE PF 50 MCG/ML IJ SOSY
50.0000 ug | PREFILLED_SYRINGE | Freq: Once | INTRAMUSCULAR | Status: AC
Start: 2021-04-15 — End: 2021-04-15
  Administered 2021-04-15: 50 ug via INTRAVENOUS
  Filled 2021-04-15: qty 1

## 2021-04-15 MED ORDER — ONDANSETRON HCL 4 MG/2ML IJ SOLN: 4.0000 mg | Freq: Four times a day (QID) | INTRAMUSCULAR | Status: DC | PRN

## 2021-04-15 MED ORDER — HYDROXYZINE HCL 25 MG PO TABS
50.0000 mg | ORAL_TABLET | Freq: Every evening | ORAL | Status: DC | PRN
  Administered 2021-04-15 – 2021-04-17 (×2): 50 mg via ORAL
  Filled 2021-04-15 (×2): qty 2

## 2021-04-15 MED ORDER — DIGOXIN 125 MCG PO TABS
0.1250 mg | ORAL_TABLET | Freq: Every day | ORAL | Status: DC
  Administered 2021-04-16: 0.125 mg via ORAL
  Filled 2021-04-15: qty 1

## 2021-04-15 MED ORDER — FUROSEMIDE 10 MG/ML IJ SOLN
40.0000 mg | Freq: Every day | INTRAMUSCULAR | Status: DC
  Administered 2021-04-15 – 2021-04-18 (×4): 40 mg via INTRAVENOUS
  Filled 2021-04-15 (×4): qty 4

## 2021-04-15 MED ORDER — METOPROLOL TARTRATE 25 MG PO TABS
50.0000 mg | ORAL_TABLET | Freq: Once | ORAL | Status: AC
  Administered 2021-04-15: 50 mg via ORAL
  Filled 2021-04-15: qty 2

## 2021-04-15 MED ORDER — SODIUM CHLORIDE 0.9 % IV SOLN: 250.0000 mL | INTRAVENOUS | Status: DC | PRN

## 2021-04-15 MED ORDER — DILTIAZEM HCL 25 MG/5ML IV SOLN
20.0000 mg | Freq: Once | INTRAVENOUS | Status: AC
  Administered 2021-04-15: 20 mg via INTRAVENOUS

## 2021-04-15 MED ORDER — DILTIAZEM HCL-DEXTROSE 125-5 MG/125ML-% IV SOLN (PREMIX)
5.0000 mg/h | INTRAVENOUS | Status: DC
  Administered 2021-04-15: 10 mg/h via INTRAVENOUS
  Administered 2021-04-16: 12.5 mg/h via INTRAVENOUS
  Administered 2021-04-16 – 2021-04-18 (×6): 15 mg/h via INTRAVENOUS
  Filled 2021-04-15 (×8): qty 125

## 2021-04-15 MED ORDER — ALLOPURINOL 100 MG PO TABS
100.0000 mg | ORAL_TABLET | Freq: Every day | ORAL | Status: DC
  Administered 2021-04-15 – 2021-04-18 (×4): 100 mg via ORAL
  Filled 2021-04-15 (×4): qty 1

## 2021-04-15 MED ORDER — TRAZODONE HCL 50 MG PO TABS
25.0000 mg | ORAL_TABLET | Freq: Every evening | ORAL | Status: DC | PRN
  Administered 2021-04-15 – 2021-04-16 (×2): 50 mg via ORAL
  Filled 2021-04-15 (×2): qty 1

## 2021-04-15 MED ORDER — DIGOXIN 0.25 MG/ML IJ SOLN
0.2500 mg | Freq: Once | INTRAMUSCULAR | Status: AC
  Administered 2021-04-15: 0.25 mg via INTRAVENOUS
  Filled 2021-04-15: qty 2

## 2021-04-15 NOTE — ED Notes (Signed)
Patient resting in stretcher comfortably. Denies any needs at this time. Call bell within reach. Stretcher in low and locked position. Side rails up x2.  

## 2021-04-15 NOTE — H&P (Signed)
Cardiology Admission History and Physical:   Patient ID: TAVIST HUDNALL MRN: [id number]; DOB: [birth year]   Admission date: 04/15/2021  PCP:  Nicolette Bang, MD   [facility] Providers Cardiologist:  Skeet Latch, MD  Electrophysiologist:  Constance Haw, MD      Chief Complaint:  Chest pain and shortness of breath  Patient Profile:   Samuel Flynn is a 45 y.o. male with history of AF with prior ablation, HTN, Gout, OSA on CPAP who is being seen 04/15/2021 for the evaluation of atypical AFL RVR.  History of Present Illness:   [name] notes that he is feeling unwell for the past one month. Family member notes that for the past month he has had leg swelling, abdominal swelling,  DOE, SOB, and weight gain.  Notes PND and orthopnea. This has progressively getting worse, despite no dietary changes or known exacerbating factors.  The only medication patient presently takes is lasix 40 mg PO Daily; and this has not helped  For the past four days (only, not with the above) he has noted palpitations.  Heart rate has been going high both at rest an with minimal activity. No near syncope or syncope.  Reason for ED Eval is that this has lead to chest pain.  Chest pain and pressure in the center of his heart.  Patient notes that he was taken off his blood thinner post 2021 AF ablation.  In the ED Heart rates as high as 220. Given 20 IV diltiazem; slowed to 140.  Has persistent ST depression in the anterolateral leads.  Cardiology called for evaluation.  Patient notes that despite the decrease in heart rate he still have chest pain.  Feels hard to catch his breath.  Denies the chest pain prior to 4 days ago.  No syncope presently.   Past Medical History:  Diagnosis Date   Atrial fibrillation with RVR ([place])    Gastric ulcer    Gout    Hypertension    Paroxysmal A-fib ([facility])    Sleep apnea    USES CPAP   Wears glasses     Past Surgical History:  Procedure  Laterality Date   ATRIAL FIBRILLATION ABLATION N/A 04/26/2018   Procedure: ATRIAL FIBRILLATION ABLATION;  Surgeon: Constance Haw, MD;  Location: [place] [facility];  Service: Cardiovascular;  Laterality: N/A;   ATRIAL FIBRILLATION ABLATION N/A 11/07/2019   Procedure: ATRIAL FIBRILLATION ABLATION;  Surgeon: Constance Haw, MD;  Location: [place] [facility];  Service: Cardiovascular;  Laterality: N/A;   CARDIOVERSION N/A 01/18/2018   Procedure: CARDIOVERSION;  Surgeon: Larey Dresser, MD;  Location: [facility];  Service: Cardiovascular;  Laterality: N/A;   CARDIOVERSION N/A 08/19/2019   Procedure: CARDIOVERSION;  Surgeon: Thayer Headings, MD;  Location: [place];  Service: Cardiovascular;  Laterality: N/A;   ELBOW LIGAMENT RECONSTRUCTION Right 09/29/2014   Procedure: Merian Capron FRACTURE FIXATION, POSSIBLE LIGAMENT REPAIR;  Surgeon: Nita Sells, MD;  Location: [place];  Service: Orthopedics;  Laterality: Right;  Right elbow coranoid fracture fixation, possible ligament repair   TEE WITHOUT CARDIOVERSION N/A 01/18/2018   Procedure: TRANSESOPHAGEAL ECHOCARDIOGRAM (TEE);  Surgeon: Larey Dresser, MD;  Location: [facility] ENDOSCOPY;  Service: Cardiovascular;  Laterality: N/A;   WISDOM TOOTH EXTRACTION       Medications Prior to Admission: Prior to Admission medications   Medication Sig Start Date End Date Taking? Authorizing Provider  colchicine 0.6 MG tablet Take 1 tablet (0.6 mg total) by mouth  daily as needed (gout flare). 08/25/20  Yes Camillia Herter, NP  furosemide (LASIX) 40 MG tablet Take 1 tablet by mouth twice daily 04/06/21  Yes Camnitz, Ocie Doyne, MD  hydrOXYzine (ATARAX/VISTARIL) 50 MG tablet Take 1 tablet (50 mg total) by mouth at bedtime as needed. Patient taking differently: Take 50 mg by mouth at bedtime as needed for anxiety or itching. 03/22/20  Yes Mayers, Cari S, PA-C  metoprolol succinate (TOPROL-XL) 100 MG 24 hr tablet Take 1 tablet (100 mg  total) by mouth daily. Take with or immediately following a meal. 09/29/20  Yes Nicolette Bang, MD  pantoprazole (PROTONIX) 40 MG tablet Take 1 tablet (40 mg total) by mouth daily. 08/11/19  Yes Fulp, Cammie, MD  traZODone (DESYREL) 50 MG tablet Take 0.5-1 tablets (25-50 mg total) by mouth at bedtime as needed for sleep. 05/04/20  Yes Nicolette Bang, MD  XARELTO 20 MG TABS tablet Take 20 mg by mouth daily with supper. 04/12/20  Yes [provider]  allopurinol (ZYLOPRIM) 100 MG tablet Take 1 tablet (100 mg total) by mouth daily. For gout Patient not taking: No sig reported 08/25/20   Camillia Herter, NP  benzonatate (TESSALON PERLES) 100 MG capsule Take 1 capsule (100 mg total) by mouth 3 (three) times daily as needed for cough. Patient not taking: No sig reported 08/25/20   Camillia Herter, NP  loratadine (CLARITIN) 10 MG tablet Take 1 tablet (10 mg total) by mouth daily. Patient not taking: Reported on 04/15/2021 08/25/20   Camillia Herter, NP  sucralfate (CARAFATE) 1 GM/10ML suspension Take 10 mLs (1 g total) by mouth 4 (four) times daily -  with meals and at bedtime. Patient not taking: No sig reported 07/14/20   Couture, Cortni S, PA-C     Allergies:   No Known Allergies  Social History:   Social History   Socioeconomic History   Marital status: Single    Spouse name: Not on file   Number of children: 4   Years of education: 10   Highest education level: Not on file  Occupational History   Occupation: Truck Education administrator: [employer]  Tobacco Use   Smoking status: Former    Types: Cigars    Quit date: 02/03/2016    Years since quitting: 5.2   Smokeless tobacco: Never  Vaping Use   Vaping Use: Never used  Substance and Sexual Activity   Alcohol use: Yes    Alcohol/week: 0.0 standard drinks    Comment: occ   Drug use: No   Sexual activity: Yes    Comment: 5 a day  Other Topics Concern   Not on file  Social History Narrative   Not on  file   Social Determinants of Health   Financial Resource Strain: Not on file  Food Insecurity: Not on file  Transportation Needs: Not on file  Physical Activity: Not on file  Stress: Not on file  Social Connections: Not on file  Intimate Partner Violence: Not on file    Family History:   The patient's family history includes Heart attack in his father; Heart disease in his father; Hypertension in his father, maternal grandmother, mother, paternal uncle, paternal uncle, paternal uncle, paternal uncle, and paternal uncle; Stroke in his father. There is no history of Colon cancer.    ROS:  Please see the history of present illness.  All other ROS reviewed and negative.     Physical Exam/Data:  Vitals:   04/15/21 1650 04/15/21 1700 04/15/21 1710 04/15/21 1720  BP: (!) 137/111 (!) 133/97 (!) 132/100 (!) 118/94  Pulse: (!) 142 (!) 139 (!) 138 (!) 138  Resp: 15 (!) 21 17 (!) 23  Temp:      TempSrc:      SpO2: 99% 99% 99% 100%  Weight:      Height:        Intake/Output Summary (Last 24 hours) at 04/15/2021 1735 Last data filed at 04/15/2021 1731 Gross per 24 hour  Intake --  Output 475 ml  Net -475 ml   Last 3 Weights 04/15/2021 08/25/2020 03/22/2020  Weight (lbs) 320 lb 313 lb 3.2 oz 321 lb  Weight (kg) 145.151 kg 142.067 kg 145.605 kg     Body mass index is 41.09 kg/m.  General:  Obese well developed, in mild distress HEENT: normal Lymph: no adenopathy Neck: Thick neck unclear JVD Endocrine:  No thryomegaly Vascular: No carotid bruits; FA pulses 2+ bilaterally without bruits  Cardiac:  Regular tachycardia no murmur but fast heart Lungs:  Coarse breath sounds with poor effort Abd: soft, nontenderly distended Ext: +1 edema; Musculoskeletal:  No deformities, BUE and BLE strength normal and equal Skin: Skins is lightly cool to touch  Neuro:  CNs 2-12 intact, no focal abnormalities noted Psych:  Normal affect   EKG:  The ECG that was done  was personally reviewed and  demonstrates heart rates 140 2:1 atrial flutter  Relevant CV Studies:  Transthoracic Echocardiogram: Date: 07/14/2019 Results:  1. Left ventricular ejection fraction, by visual estimation, is 55 to  60%. The left ventricle has normal function. There is mildly increased  left ventricular hypertrophy. Appears normal systolic function, though  anterior wall is not well visualized   2. Left ventricular diastolic parameters are indeterminate.   3. Global right ventricle has normal systolic function.The right  ventricular size is normal. No increase in right ventricular wall  thickness.   4. Left atrial size was mildly dilated.   5. Right atrial size was normal.   6. The mitral valve is normal in structure. Mild mitral valve  regurgitation.   7. The tricuspid valve is normal in structure. Tricuspid valve  regurgitation is not demonstrated.   8. The aortic valve is tricuspid. Aortic valve regurgitation is not  visualized. No evidence of aortic valve sclerosis or stenosis.   9. The pulmonic valve was not well visualized. Pulmonic valve  regurgitation is not visualized.  10. The inferior vena cava is dilated in size with >50% respiratory  variability, suggesting right atrial pressure of 8 mmHg.  11. TR signal is inadequate for assessing pulmonary artery systolic  pressure.   Transesophageal Echocardiogram: Date: 01/18/2018 Results: - Left ventricle: The cavity size was normal. Wall thickness was    normal. Systolic function was normal. The estimated ejection    fraction was in the range of 55% to 60%. Wall motion was normal;    there were no regional wall motion abnormalities. No evidence of    thrombus.  - Aortic valve: There was no stenosis.  - Aorta: Normal caliber thoracic aorta with no significant plaque.  - Mitral valve: There was trivial regurgitation.  - Left atrium: The atrium was mildly dilated. No evidence of    thrombus in the atrial cavity or appendage. No evidence of     thrombus in the atrial cavity or appendage.  - Right ventricle: The cavity size was normal. Systolic function    was normal.  -  Right atrium: No evidence of thrombus in the atrial cavity or    appendage.  - Atrial septum: No ASD/PFO by color doppler.   Impressions:   - Successful cardioversion. No cardiac source of emboli was    indentified.   NonCardiac CT: Date:07/10/2018 Results:   IMPRESSION: 1.  Pulmonary veins as noted above.   2.  No LA appendage or LA thrombus.   3.  No significant coronary disease.   4. Coronary artery calcium score 0 Agatston units, suggesting low risk for future cardiac events.  ECG or NM Stress Testing : Date:11/11/2015 Results: The left ventricular ejection fraction is mildly decreased (45-54%). Nuclear stress EF: 52%. There was no ST segment deviation noted during stress. No T wave inversion was noted during stress. The study is normal. This is a low risk study.  Laboratory Data:  High Sensitivity Troponin:   Recent Labs  Lab 04/15/21 1530  TROPONINIHS 35*      Chemistry Recent Labs  Lab 04/15/21 1530  NA 137  K 3.8  CL 102  CO2 20*  GLUCOSE 173*  BUN 14  CREATININE 1.44*  CALCIUM 9.2  GFRNONAA >60  ANIONGAP 15    No results for input(s): PROT, ALBUMIN, AST, ALT, ALKPHOS, BILITOT in the last 168 hours. Hematology Recent Labs  Lab 04/15/21 1530  WBC 11.9*  RBC 6.11*  HGB 17.1*  HCT 51.3  MCV 84.0  MCH 28.0  MCHC 33.3  RDW 18.9*  PLT 301   BNPNo results for input(s): BNP, PROBNP in the last 168 hours.  DDimer No results for input(s): DDIMER in the last 168 hours.   Radiology/Studies:  DG Chest Port 1 View  Result Date: 04/15/2021 CLINICAL DATA:  Chest pain EXAM: PORTABLE CHEST 1 VIEW COMPARISON:  Chest radiograph 01/28/2020 FINDINGS: Defibrillator pads overlie the chest. The heart is enlarged, similar to the prior study. Mediastinal contours are within normal limits. There is no focal consolidation or pulmonary  edema. There is no pleural effusion or pneumothorax. There is no acute osseous abnormality. IMPRESSION: Unchanged cardiomegaly. Otherwise, no radiographic evidence of acute cardiopulmonary process. Electronically Signed   By: Valetta Mole M.D.   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; arrhythmia last started 4 days prior -tsh is pending; Echo planned if reasonable heart rate - starting diltiazem drip; dig IV X1 with po for 8/13 and dig level - Added on for TEE/DCCV 04/18/21 8AM  so long as euvolemic - discussed indications for urgent DCCV  HF NOS - starting with 40 IV lasix but will likely increase dose - echo and TSH as above; may need HFrEF GDMT based on results  OSA- will return CPAP  Gout- risk of exacerbation will start allopurinol back  DDT PPX- starting heparin  Cardiac Diet  Full Code      Risk Assessment/Risk Scores:    HEAR SCORE: 4 (NA) this appears related to tachycardia  New York Heart Association (NYHA) Functional Class NYHA Class IV  CHA2DS2-VASc Score = 2  This indicates a 2.2% annual risk of stroke. The patient's score is based upon: CHF History: Yes HTN History: Yes Diabetes History: No Stroke History: No Vascular Disease History: No Age Score: 0 Gender Score: 0     Severity of Illness: The appropriate patient status for this patient is INPATIENT. Inpatient status is judged to be reasonable and necessary in order to provide the required intensity of service  to ensure the patient's safety. The patient's presenting symptoms, physical exam findings, and initial radiographic and laboratory data in the context of their chronic comorbidities is felt to place them at high risk for further clinical deterioration. Furthermore, it is not anticipated that the patient will be medically stable for discharge from the hospital within 2 midnights of admission. The following factors support the patient status of  inpatient.   " The patient's presenting symptoms include shortness of breath and chest pain. " The worrisome physical exam findings include heart rate in 140s. " The initial radiographic and laboratory data are worrisome because of troponin 35. " The chronic co-morbidities include Morbid Obesity  hypertension and atrial fibrillation.   * I certify that at the point of admission it is my clinical judgment that the patient will require inpatient hospital care spanning beyond 2 midnights from the point of admission due to high intensity of service, high risk for further deterioration and high frequency of surveillance required.*   For questions or updates, please contact [place] Please consult [email or web address] for contact info under       [facility] has been requested to perform a transesophageal echocardiogram on [name] for TEE/DCCV.  After careful review of history and examination, the risks and benefits of transesophageal echocardiogram have been explained including risks of esophageal damage, perforation (1:10,000 risk), bleeding, pharyngeal hematoma as well as other potential complications associated with conscious sedation including aspiration, arrhythmia, respiratory failure and death. Alternatives to treatment were discussed, questions were answered. Patient is willing to proceed.   Werner Lean, MD  04/15/2021 5:47 PM     Signed, Werner Lean, MD  04/15/2021 5:35 PM

## 2021-04-15 NOTE — ED Triage Notes (Signed)
Hx of af  he has been out of his af meds for 4 days  he has been in af for 2 days  started having chest pain  on the way here

## 2021-04-15 NOTE — Progress Notes (Signed)
ANTICOAGULATION CONSULT NOTE - Initial Consult  Pharmacy Consult for Heparin  Indication: atrial fibrillation  No Known Allergies  Patient Measurements: Height: '6\' 2"'$  (188 cm) Weight: (!) 145.2 kg (320 lb) IBW/kg (Calculated) : 82.2 Heparin Dosing Weight: 115.5 kg  Vital Signs: Temp: 99 F (37.2 C) (08/12 1520) Temp Source: Oral (08/12 1520) BP: 153/103 (08/12 1630) Pulse Rate: 143 (08/12 1630)  Labs: Recent Labs    04/15/21 1530  HGB 17.1*  HCT 51.3  PLT 301  CREATININE 1.44*  TROPONINIHS 35*    Estimated Creatinine Clearance: 97.4 mL/min (A) (by C-G formula based on SCr of 1.44 mg/dL (H)).   Medical History: Past Medical History:  Diagnosis Date   Atrial fibrillation with RVR (HCC)    Gastric ulcer    Gout    Hypertension    Paroxysmal A-fib (HCC)    Sleep apnea    USES CPAP   Wears glasses     Assessment: 45 YO male who presented with chest pain and has been in afib for the last 4 days. He had diaphoresis, SOB, and has been out of his metoprolol for 2 days. HR on arrival was 270, hgb 17, hct and plts WNL, Scr 1.44, trop 35. Patient is on Xarelto at home for afib and last known dose was this past week.  Goal of Therapy:  Heparin level 0.3-0.7 units/ml Monitor platelets by anticoagulation protocol: Yes   Plan:  Start heparin infusion at 1750 units/hr Check anti-Xa level and aPTT in 6 hours and until they correlate while on heparin, and then daily Continue to monitor H&H and platelets Monitor for signs and symptoms of bleeding Follow-up [facility] plan   Varney Daily, PharmD PGY1 Pharmacy Resident  Please check AMION for all [facility] pharmacy phone numbers After 10:00 PM call main pharmacy [phone]

## 2021-04-15 NOTE — ED Provider Notes (Signed)
[facility] EMERGENCY DEPARTMENT Provider Note   CSN: [id number] Arrival date & time: 04/15/21  1506     History Chief Complaint  Patient presents with   Chest Pain    Samuel Flynn is a 45 y.o. male.   Chest Pain Associated symptoms: diaphoresis, palpitations and shortness of breath   Associated symptoms: no abdominal pain, no back pain, no cough, no fever and no vomiting    45 year old male with past medical history of atrial fibrillation status post ablation presenting to the emergency department with chest pressure, shortness of breath, and diaphoresis for the past 4 days.  Patient states that he ran out of his metoprolol 4 days ago and has not had any rate controlling agents since that time.  He states he does not take a blood thinner any longer.  He states that for the past 3 to 4 days, he has felt short of breath.  He is also had palpitations.  His symptoms are worse with exertion.  He has had diffuse chest pressure as well.  He is also been diaphoretic for the past several days.  His symptoms have been largely constant.  No therapies tried at home.  Due to the persistence of his symptoms, he presented to the emergency department.  Past Medical History:  Diagnosis Date   Atrial fibrillation with RVR ([place])    Gastric ulcer    Gout    Hypertension    Paroxysmal A-fib (HCC)    Sleep apnea    USES CPAP   Wears glasses     Patient Active Problem List   Diagnosis Date Noted   Atrial fibrillation with rapid ventricular response ([place]) 04/15/2021   Persistent atrial fibrillation (HCC)    Acute on chronic diastolic heart failure ([place]) 07/15/2019   Atrial flutter ([place]) 07/13/2019   Normal coronary arteries 04/11/2019   OSA (obstructive sleep apnea) 08/13/2018   GAD (generalized anxiety disorder) 08/13/2018   Acute pulmonary edema ([place]) 08/13/2018   S/P ablation of atrial fibrillation 04/26/18 04/27/2018   Paroxysmal atrial fibrillation ([place]) 04/26/2018    Atrial fibrillation with RVR ([place]) 04/10/2016   Essential hypertension, benign 02/19/2014   Gout 12/27/2006   Morbid obesity ([place]) 12/27/2006    Past Surgical History:  Procedure Laterality Date   ATRIAL FIBRILLATION ABLATION N/A 04/26/2018   Procedure: ATRIAL FIBRILLATION ABLATION;  Surgeon: Constance Haw, MD;  Location: [place] CV LAB;  Service: Cardiovascular;  Laterality: N/A;   ATRIAL FIBRILLATION ABLATION N/A 11/07/2019   Procedure: ATRIAL FIBRILLATION ABLATION;  Surgeon: Constance Haw, MD;  Location: [place] CV LAB;  Service: Cardiovascular;  Laterality: N/A;   CARDIOVERSION N/A 01/18/2018   Procedure: CARDIOVERSION;  Surgeon: Larey Dresser, MD;  Location: [facility] ENDOSCOPY;  Service: Cardiovascular;  Laterality: N/A;   CARDIOVERSION N/A 08/19/2019   Procedure: CARDIOVERSION;  Surgeon: Thayer Headings, MD;  Location: [place];  Service: Cardiovascular;  Laterality: N/A;   ELBOW LIGAMENT RECONSTRUCTION Right 09/29/2014   Procedure: Merian Capron FRACTURE FIXATION, POSSIBLE LIGAMENT REPAIR;  Surgeon: Nita Sells, MD;  Location: [place];  Service: Orthopedics;  Laterality: Right;  Right elbow coranoid fracture fixation, possible ligament repair   TEE WITHOUT CARDIOVERSION N/A 01/18/2018   Procedure: TRANSESOPHAGEAL ECHOCARDIOGRAM (TEE);  Surgeon: Larey Dresser, MD;  Location: [facility] ENDOSCOPY;  Service: Cardiovascular;  Laterality: N/A;   WISDOM TOOTH EXTRACTION         Family History  Problem Relation Age of Onset   Hypertension Paternal  Uncle    Hypertension Maternal Grandmother    Hypertension Paternal Uncle    Hypertension Paternal Uncle    Hypertension Paternal Uncle    Hypertension Paternal Uncle    Hypertension Mother    Stroke Father    Heart disease Father    Heart attack Father    Hypertension Father    Colon cancer Neg Hx     Social History   Tobacco Use   Smoking status: Former    Types: Cigars    Quit date:  02/03/2016    Years since quitting: 5.2   Smokeless tobacco: Never  Vaping Use   Vaping Use: Never used  Substance Use Topics   Alcohol use: Yes    Alcohol/week: 0.0 standard drinks    Comment: occ   Drug use: No    Home Medications Prior to Admission medications   Medication Sig Start Date End Date Taking? Authorizing Provider  colchicine 0.6 MG tablet Take 1 tablet (0.6 mg total) by mouth daily as needed (gout flare). 08/25/20  Yes Camillia Herter, NP  furosemide (LASIX) 40 MG tablet Take 1 tablet by mouth twice daily 04/06/21  Yes Camnitz, Ocie Doyne, MD  hydrOXYzine (ATARAX/VISTARIL) 50 MG tablet Take 1 tablet (50 mg total) by mouth at bedtime as needed. Patient taking differently: Take 50 mg by mouth at bedtime as needed for anxiety or itching. 03/22/20  Yes Mayers, Cari S, PA-C  metoprolol succinate (TOPROL-XL) 100 MG 24 hr tablet Take 1 tablet (100 mg total) by mouth daily. Take with or immediately following a meal. 09/29/20  Yes Nicolette Bang, MD  pantoprazole (PROTONIX) 40 MG tablet Take 1 tablet (40 mg total) by mouth daily. 08/11/19  Yes Fulp, Cammie, MD  traZODone (DESYREL) 50 MG tablet Take 0.5-1 tablets (25-50 mg total) by mouth at bedtime as needed for sleep. 05/04/20  Yes Nicolette Bang, MD  XARELTO 20 MG TABS tablet Take 20 mg by mouth daily with supper. 04/12/20  Yes [provider]  allopurinol (ZYLOPRIM) 100 MG tablet Take 1 tablet (100 mg total) by mouth daily. For gout Patient not taking: No sig reported 08/25/20   Camillia Herter, NP  benzonatate (TESSALON PERLES) 100 MG capsule Take 1 capsule (100 mg total) by mouth 3 (three) times daily as needed for cough. Patient not taking: No sig reported 08/25/20   Camillia Herter, NP  loratadine (CLARITIN) 10 MG tablet Take 1 tablet (10 mg total) by mouth daily. Patient not taking: Reported on 04/15/2021 08/25/20   Camillia Herter, NP  sucralfate (CARAFATE) 1 GM/10ML suspension Take 10 mLs (1 g total)  by mouth 4 (four) times daily -  with meals and at bedtime. Patient not taking: No sig reported 07/14/20   Couture, Cortni S, PA-C    Allergies    Patient has no known allergies.  Review of Systems   Review of Systems  Constitutional:  Positive for diaphoresis. Negative for chills and fever.  HENT:  Negative for ear pain and sore throat.   Eyes:  Negative for pain and visual disturbance.  Respiratory:  Positive for shortness of breath. Negative for cough.   Cardiovascular:  Positive for chest pain and palpitations.  Gastrointestinal:  Negative for abdominal pain and vomiting.  Genitourinary:  Negative for dysuria and hematuria.  Musculoskeletal:  Negative for arthralgias and back pain.  Skin:  Negative for color change and rash.  Neurological:  Negative for seizures and syncope.  All other systems reviewed and  are negative.  Physical Exam Updated Vital Signs BP (!) 118/94   Pulse (!) 138   Temp 99 F (37.2 C) (Oral)   Resp (!) 23   Ht '6\' 2"'$  (1.88 m)   Wt (!) 145.2 kg   SpO2 100%   BMI 41.09 kg/m   Physical Exam Vitals and nursing note reviewed.  Constitutional:      General: He is not in acute distress.    Appearance: He is well-developed. He is obese. He is ill-appearing and diaphoretic. He is not toxic-appearing.  HENT:     Head: Normocephalic and atraumatic.  Eyes:     Conjunctiva/sclera: Conjunctivae normal.  Cardiovascular:     Rate and Rhythm: Tachycardia present. Rhythm irregular.  Pulmonary:     Effort: Pulmonary effort is normal. No respiratory distress.     Breath sounds: Normal breath sounds.  Abdominal:     Palpations: Abdomen is soft.     Tenderness: There is no abdominal tenderness.  Musculoskeletal:     Cervical back: Neck supple.     Right lower leg: No edema.     Left lower leg: No edema.  Skin:    General: Skin is warm.     Capillary Refill: Capillary refill takes less than 2 seconds.  Neurological:     General: No focal deficit present.      Mental Status: He is alert and oriented to person, place, and time.    ED Results / Procedures / Treatments   Labs (all labs ordered are listed, but only abnormal results are displayed) Labs Reviewed  BASIC METABOLIC PANEL - Abnormal; Notable for the following components:      Result Value   CO2 20 (*)    Glucose, Bld 173 (*)    Creatinine, Ser 1.44 (*)    All other components within normal limits  CBC - Abnormal; Notable for the following components:   WBC 11.9 (*)    RBC 6.11 (*)    Hemoglobin 17.1 (*)    RDW 18.9 (*)    All other components within normal limits  TROPONIN I (HIGH SENSITIVITY) - Abnormal; Notable for the following components:   Troponin I (High Sensitivity) 35 (*)    All other components within normal limits  RESP PANEL BY RT-PCR (FLU A&B, COVID) ARPGX2  MAGNESIUM  TSH  HEMOGLOBIN A1C  HEPATIC FUNCTION PANEL  DIGOXIN LEVEL  HEPARIN LEVEL (UNFRACTIONATED)  APTT  HEPARIN LEVEL (UNFRACTIONATED)  CBC  APTT  HEPARIN LEVEL (UNFRACTIONATED)  APTT  TROPONIN I (HIGH SENSITIVITY)    EKG EKG Interpretation  Date/Time:  Friday April 15 2021 15:23:42 EDT Ventricular Rate:  140 PR Interval:  110 QRS Duration: 90 QT Interval:  254 QTC Calculation: 388 R Axis:   0 Text Interpretation: Sinus tachycardia new Repol abnrm suggests ischemia, diffuse leads wide complex tachycardia resolved Confirmed by Blanchie Dessert [id number]) on 04/15/2021 3:28:42 PM  Radiology DG Chest Port 1 View  Result Date: 04/15/2021 CLINICAL DATA:  Chest pain EXAM: PORTABLE CHEST 1 VIEW COMPARISON:  Chest radiograph 01/28/2020 FINDINGS: Defibrillator pads overlie the chest. The heart is enlarged, similar to the prior study. Mediastinal contours are within normal limits. There is no focal consolidation or pulmonary edema. There is no pleural effusion or pneumothorax. There is no acute osseous abnormality. IMPRESSION: Unchanged cardiomegaly. Otherwise, no radiographic evidence of acute  cardiopulmonary process. Electronically Signed   By: Valetta Mole M.D.   On: 04/15/2021 16:21    Procedures Procedures   Medications Ordered  in ED Medications  diltiazem (CARDIZEM) 125 mg in dextrose 5% 125 mL (1 mg/mL) infusion (12.5 mg/hr Intravenous Rate/Dose Change 04/15/21 1643)  digoxin (LANOXIN) 0.25 MG/ML injection 0.25 mg (has no administration in time range)  digoxin (LANOXIN) tablet 0.125 mg (has no administration in time range)  furosemide (LASIX) injection 40 mg (has no administration in time range)  hydrOXYzine (ATARAX/VISTARIL) tablet 50 mg (has no administration in time range)  pantoprazole (PROTONIX) EC tablet 40 mg (has no administration in time range)  traZODone (DESYREL) tablet 25-50 mg (has no administration in time range)  allopurinol (ZYLOPRIM) tablet 100 mg (has no administration in time range)  heparin ADULT infusion 100 units/mL (25000 units/232m) (has no administration in time range)  diltiazem (CARDIZEM) injection 20 mg (20 mg Intravenous Given 04/15/21 1522)  aspirin chewable tablet 324 mg (324 mg Oral Given 04/15/21 1533)  metoprolol tartrate (LOPRESSOR) tablet 50 mg (50 mg Oral Given 04/15/21 1533)  fentaNYL (SUBLIMAZE) injection 50 mcg (50 mcg Intravenous Given 04/15/21 1619)  lactated ringers bolus 1,000 mL (1,000 mLs Intravenous New Bag/Given 04/15/21 1619)    ED Course  I have reviewed the triage vital signs and the nursing notes.  Pertinent labs & imaging results that were available during my care of the patient were reviewed by me and considered in my medical decision making (see chart for details).    MDM Rules/Calculators/A&P                           45year old male with a past medical [place] atrial fibrillation status post ablation on metoprolol at home, no anticoagulation presenting with chest pressure, shortness of breath, palpitations for the past 4 days in the setting of being out of his rate controlling medications.  Vital signs reviewed on  arrival, the patient is normotensive.  He is symptomatic, but he is alert with normal mental status.  Heart rate is 260 bpm on arrival.  On EKG, which captured him at a rate of around 180, and it seems to have a narrow complex irregular irregular rhythm consistent with atrial fibrillation with rapid ventricular response.  Patient seems stable with this arrhythmia.  He reports some recent nasal congestion.  Suspect that the trigger for his event is medication nonadherence.  Administered IV 20 mg of diltiazem with immediate improvement improvement in his rate to around 140 bpm.  With repeat EKG, it first appears to be sinus tachycardia but could also be a flutter as there is almost no variability in his rate.  Will administer his home p.o. rate controlling medication, obtain electrolyte panel, and consult cardiology.  On repeat EKG around 10 to 15 minutes later, patient's rate increasing again.  We will start diltiazem infusion.  Patient's potassium is 3.8, within acceptable limits.  Magnesium acceptable.  Mildly low bicarb at 20.  Creatinine of 1.44, slightly up from his baseline of 1.21.  In conjunction with his CBC, which appears hemoconcentrated, he seems volume down.  Will administer fluid bolus.  Patient will be admitted to cardiology for further evaluation management.  Final Clinical Impression(s) / ED Diagnoses Final diagnoses:  Palpitations    Rx / DC Orders ED Discharge Orders     None        PClaud Kelp MD 04/15/21 1727    PBlanchie Dessert MD 04/18/21 [phone]

## 2021-04-15 NOTE — ED Provider Notes (Addendum)
Emergency Medicine Provider Triage Evaluation Note  Samuel Flynn , a 45 y.o. male  was evaluated in triage.  Pt complains of chest pain. States he has been in afib for 2  days. Has had diaphoresis for the last several hours and developed chest pain en route. Has been out of metoprolol for 4 days  Review of Systems  Positive: Chest pain, palpitations, sob, diaphoresis Negative: Abd pain  Physical Exam  There were no vitals taken for this visit. Gen:   Awake, ill appearing Resp:  Normal effort  MSK:   Moves extremities without difficulty  Other:  Tachycardic, diaphoretic  Medical Decision Making  Medically screening exam initiated at 3:11 PM.  Appropriate orders placed.  [name] was informed that the remainder of the evaluation will be completed by another provider, this initial triage assessment does not replace that evaluation, and the importance of remaining in the ED until their evaluation is complete.  Pt taken to a room immediately   Rodney Booze, PA-C 04/15/21 1513    Kathren Scearce S, PA-C 04/15/21 1513    Rodney Booze, PA-C 04/15/21 1514    Pattricia Boss, MD 04/19/21 [phone]

## 2021-04-16 ENCOUNTER — Observation Stay (HOSPITAL_COMMUNITY): Payer: Medicaid Other

## 2021-04-16 DIAGNOSIS — N182 Chronic kidney disease, stage 2 (mild): Secondary | ICD-10-CM | POA: Diagnosis not present

## 2021-04-16 DIAGNOSIS — M109 Gout, unspecified: Secondary | ICD-10-CM | POA: Diagnosis not present

## 2021-04-16 DIAGNOSIS — I4891 Unspecified atrial fibrillation: Secondary | ICD-10-CM | POA: Diagnosis not present

## 2021-04-16 DIAGNOSIS — I509 Heart failure, unspecified: Secondary | ICD-10-CM

## 2021-04-16 DIAGNOSIS — I4892 Unspecified atrial flutter: Secondary | ICD-10-CM | POA: Diagnosis not present

## 2021-04-16 DIAGNOSIS — G4733 Obstructive sleep apnea (adult) (pediatric): Secondary | ICD-10-CM | POA: Diagnosis not present

## 2021-04-16 DIAGNOSIS — Z79899 Other long term (current) drug therapy: Secondary | ICD-10-CM | POA: Diagnosis not present

## 2021-04-16 DIAGNOSIS — Z8249 Family history of ischemic heart disease and other diseases of the circulatory system: Secondary | ICD-10-CM | POA: Diagnosis not present

## 2021-04-16 DIAGNOSIS — I4819 Other persistent atrial fibrillation: Secondary | ICD-10-CM | POA: Diagnosis not present

## 2021-04-16 DIAGNOSIS — M1000 Idiopathic gout, unspecified site: Secondary | ICD-10-CM | POA: Diagnosis not present

## 2021-04-16 DIAGNOSIS — Z7901 Long term (current) use of anticoagulants: Secondary | ICD-10-CM | POA: Diagnosis not present

## 2021-04-16 DIAGNOSIS — I130 Hypertensive heart and chronic kidney disease with heart failure and stage 1 through stage 4 chronic kidney disease, or unspecified chronic kidney disease: Secondary | ICD-10-CM | POA: Diagnosis not present

## 2021-04-16 DIAGNOSIS — Z87891 Personal history of nicotine dependence: Secondary | ICD-10-CM | POA: Diagnosis not present

## 2021-04-16 DIAGNOSIS — I483 Typical atrial flutter: Secondary | ICD-10-CM | POA: Diagnosis not present

## 2021-04-16 DIAGNOSIS — Z8711 Personal history of peptic ulcer disease: Secondary | ICD-10-CM | POA: Diagnosis not present

## 2021-04-16 DIAGNOSIS — I5033 Acute on chronic diastolic (congestive) heart failure: Secondary | ICD-10-CM | POA: Diagnosis not present

## 2021-04-16 DIAGNOSIS — E1122 Type 2 diabetes mellitus with diabetic chronic kidney disease: Secondary | ICD-10-CM | POA: Diagnosis not present

## 2021-04-16 DIAGNOSIS — I110 Hypertensive heart disease with heart failure: Secondary | ICD-10-CM | POA: Diagnosis not present

## 2021-04-16 DIAGNOSIS — Z6841 Body Mass Index (BMI) 40.0 and over, adult: Secondary | ICD-10-CM | POA: Diagnosis not present

## 2021-04-16 DIAGNOSIS — E119 Type 2 diabetes mellitus without complications: Secondary | ICD-10-CM | POA: Diagnosis not present

## 2021-04-16 DIAGNOSIS — Z20822 Contact with and (suspected) exposure to covid-19: Secondary | ICD-10-CM | POA: Diagnosis not present

## 2021-04-16 DIAGNOSIS — I340 Nonrheumatic mitral (valve) insufficiency: Secondary | ICD-10-CM | POA: Diagnosis not present

## 2021-04-16 DIAGNOSIS — I5031 Acute diastolic (congestive) heart failure: Secondary | ICD-10-CM | POA: Diagnosis not present

## 2021-04-16 DIAGNOSIS — R002 Palpitations: Secondary | ICD-10-CM | POA: Diagnosis not present

## 2021-04-16 LAB — CBC
HCT: 49 % (ref 39.0–52.0)
Hemoglobin: 15.4 g/dL (ref 13.0–17.0)
MCH: 27.9 pg (ref 26.0–34.0)
MCHC: 31.4 g/dL (ref 30.0–36.0)
MCV: 88.9 fL (ref 80.0–100.0)
Platelets: 234 10*3/uL (ref 150–400)
RBC: 5.51 MIL/uL (ref 4.22–5.81)
RDW: 19.1 % — ABNORMAL HIGH (ref 11.5–15.5)
WBC: 7.7 10*3/uL (ref 4.0–10.5)
nRBC: 0 % (ref 0.0–0.2)

## 2021-04-16 LAB — ECHOCARDIOGRAM COMPLETE
Area-P 1/2: 5.79 cm2
Height: 74 in
S' Lateral: 4.7 cm
Single Plane A4C EF: 59.3 %
Weight: 5054.4 oz

## 2021-04-16 LAB — DIGOXIN LEVEL: Digoxin Level: 0.2 ng/mL — ABNORMAL LOW (ref 0.8–2.0)

## 2021-04-16 LAB — LIPID PANEL
Cholesterol: 203 mg/dL — ABNORMAL HIGH (ref 0–200)
HDL: 24 mg/dL — ABNORMAL LOW (ref >40)
LDL Cholesterol: UNDETERMINED mg/dL (ref 0–99)
Total CHOL/HDL Ratio: 8.5 RATIO
Triglycerides: 428 mg/dL — ABNORMAL HIGH (ref <150)
VLDL: UNDETERMINED mg/dL (ref 0–40)

## 2021-04-16 LAB — APTT
aPTT: 47 seconds — ABNORMAL HIGH (ref 24–36)
aPTT: 52 seconds — ABNORMAL HIGH (ref 24–36)

## 2021-04-16 LAB — LDL CHOLESTEROL, DIRECT: Direct LDL: 107 mg/dL — ABNORMAL HIGH (ref 0–99)

## 2021-04-16 LAB — BASIC METABOLIC PANEL
Anion gap: 11 (ref 5–15)
BUN: 15 mg/dL (ref 6–20)
CO2: 23 mmol/L (ref 22–32)
Calcium: 8.4 mg/dL — ABNORMAL LOW (ref 8.9–10.3)
Chloride: 103 mmol/L (ref 98–111)
Creatinine, Ser: 1.16 mg/dL (ref 0.61–1.24)
GFR, Estimated: >60 mL/min (ref >60)
Glucose, Bld: 137 mg/dL — ABNORMAL HIGH (ref 70–99)
Potassium: 3.9 mmol/L (ref 3.5–5.1)
Sodium: 137 mmol/L (ref 135–145)

## 2021-04-16 LAB — HIV ANTIBODY (ROUTINE TESTING W REFLEX): HIV Screen 4th Generation wRfx: NONREACTIVE

## 2021-04-16 LAB — HEPARIN LEVEL (UNFRACTIONATED)
Heparin Unfractionated: >1.1 IU/mL — ABNORMAL HIGH (ref 0.30–0.70)
Heparin Unfractionated: >1.1 IU/mL — ABNORMAL HIGH (ref 0.30–0.70)

## 2021-04-16 LAB — TSH: TSH: 2.62 u[IU]/mL (ref 0.350–4.500)

## 2021-04-16 MED ORDER — RIVAROXABAN 20 MG PO TABS
20.0000 mg | ORAL_TABLET | Freq: Every day | ORAL | Status: DC
  Administered 2021-04-16 – 2021-04-17 (×2): 20 mg via ORAL
  Filled 2021-04-16 (×2): qty 1

## 2021-04-16 MED ORDER — PERFLUTREN LIPID MICROSPHERE
1.0000 mL | INTRAVENOUS | Status: AC | PRN
  Administered 2021-04-16: 2 mL via INTRAVENOUS
  Filled 2021-04-16: qty 10

## 2021-04-16 MED ORDER — ORAL CARE MOUTH RINSE
15.0000 mL | Freq: Two times a day (BID) | OROMUCOSAL | Status: DC
  Administered 2021-04-16: 15 mL via OROMUCOSAL

## 2021-04-16 MED ORDER — COLCHICINE 0.6 MG PO TABS
0.6000 mg | ORAL_TABLET | Freq: Two times a day (BID) | ORAL | Status: DC | PRN
  Administered 2021-04-16 – 2021-04-18 (×4): 0.6 mg via ORAL
  Filled 2021-04-16 (×4): qty 1

## 2021-04-16 NOTE — Progress Notes (Signed)
Verified heparin and diltiazem compatible in Y site with [employer] [employer] d.

## 2021-04-16 NOTE — Plan of Care (Signed)

## 2021-04-16 NOTE — Progress Notes (Addendum)
Progress Note  Patient Name: Samuel Flynn Date of Encounter: 04/16/2021  [facility] Cardiologist: Skeet Latch, MD   Subjective   Sleeping comfortably with CPAP on. No acute events overnight, no new concerns.  Inpatient Medications    Scheduled Meds:  allopurinol  100 mg Oral Daily   digoxin  0.125 mg Oral Daily   furosemide  40 mg Intravenous Daily   mouth rinse  15 mL Mouth Rinse BID   pantoprazole  40 mg Oral Daily   sodium chloride flush  3 mL Intravenous Q12H   Continuous Infusions:  sodium chloride     diltiazem (CARDIZEM) infusion 15 mg/hr (04/16/21 0235)   heparin 2,000 Units/hr (04/16/21 0725)   PRN Meds: sodium chloride, acetaminophen, hydrOXYzine, ondansetron (ZOFRAN) IV, sodium chloride flush, traZODone   Vital Signs    Vitals:   04/15/21 2255 04/15/21 2348 04/15/21 2351 04/16/21 0747  BP: (!) 146/73   114/79  Pulse: (!) 105 86  94  Resp: 17   20  Temp:  (!) 97.5 F (36.4 C)  97.8 F (36.6 C)  TempSrc:  Axillary  Axillary  SpO2: 98% 96%  98%  Weight:   (!) 143.3 kg   Height:        Intake/Output Summary (Last 24 hours) at 04/16/2021 0923 Last data filed at 04/15/2021 2300 Gross per 24 hour  Intake 1145.55 ml  Output 1475 ml  Net -329.45 ml   Last 3 Weights 04/15/2021 04/15/2021 08/25/2020  Weight (lbs) 315 lb 14.4 oz 320 lb 313 lb 3.2 oz  Weight (kg) 143.291 kg 145.151 kg 142.067 kg      Telemetry    Atrial flutter with rates 100s-130s - Personally Reviewed  ECG    04/15/21  atrial flutter at 140 bpm- Personally Reviewed  Physical Exam   GEN: No acute distress.   Neck: No JVD appreciated but difficult body habitus Cardiac: tachycardic, largely regular, no murmurs, rubs, or gallops.  Respiratory: CPAP ventilatory sounds GI: Soft, nontender, non-distended  MS: Trivial bilateral LE edema; No deformity. Neuro:  Nonfocal  Psych: Normal affect   Labs    High Sensitivity Troponin:   Recent Labs  Lab 04/15/21 1530  04/15/21 1730  TROPONINIHS 35* 112*      Chemistry Recent Labs  Lab 04/15/21 1530 04/15/21 1730 04/16/21 0413  NA 137  --  137  K 3.8  --  3.9  CL 102  --  103  CO2 20*  --  23  GLUCOSE 173*  --  137*  BUN 14  --  15  CREATININE 1.44*  --  1.16  CALCIUM 9.2  --  8.4*  PROT  --  6.5  --   ALBUMIN  --  3.5  --   AST  --  22  --   ALT  --  22  --   ALKPHOS  --  76  --   BILITOT  --  1.0  --   GFRNONAA >60  --  >60  ANIONGAP 15  --  11     Hematology Recent Labs  Lab 04/15/21 1530 04/16/21 0413  WBC 11.9* 7.7  RBC 6.11* 5.51  HGB 17.1* 15.4  HCT 51.3 49.0  MCV 84.0 88.9  MCH 28.0 27.9  MCHC 33.3 31.4  RDW 18.9* 19.1*  PLT 301 234    BNPNo results for input(s): BNP, PROBNP in the last 168 hours.   DDimer No results for input(s): DDIMER in the last 168 hours.  Radiology    DG Chest Port 1 View  Result Date: 04/15/2021 CLINICAL DATA:  Chest pain EXAM: PORTABLE CHEST 1 VIEW COMPARISON:  Chest radiograph 01/28/2020 FINDINGS: Defibrillator pads overlie the chest. The heart is enlarged, similar to the prior study. Mediastinal contours are within normal limits. There is no focal consolidation or pulmonary edema. There is no pleural effusion or pneumothorax. There is no acute osseous abnormality. IMPRESSION: Unchanged cardiomegaly. Otherwise, no radiographic evidence of acute cardiopulmonary process. Electronically Signed   By: Valetta Mole M.D.   On: 04/15/2021 16:21    Cardiac Studies   Echo pending  Patient Profile     45 y.o. male with PMH AF s/p prior ablation, PSA on CPAP admitted for atypical flutter.   Assessment & Plan    Atrial flutter -CHA2DS2/VAS Stroke Risk Points=2  -has been off of anticoagulation -started on heparin this admission. If EF preserved, would change to DOAC (as no plans for cath). If reduced, continue heparin, would consider R/LHC prior to TEE-CV -planned for TEE-CV this admission -rate controlled on diltiazem drip. If EF reduced,  change to beta blocker -once rate controlled, stop digoxin  Clinical heart failure, unclear if preserved or reduced EF -echo pending -furosemide 40 mg IV daily. Charted as -300 cc, admission weight 143.3 kg, no weight today -with new diagnosis of diabetes, would start SGLT2i prior to discharge -GDMT based on whether preserved or reduced EF  Elevated troponin: -suspect demand in the setting of RVR and heart failure -hsTn 35 > 112 -prior imaging with no coronary calcium calcium, no known CAD  Type II diabetes: -A1c 6.9, meets criteria for diabetes  For questions or updates, please contact [facility] Please consult [email or web address] for contact info under        Signed, Buford Dresser, MD  04/16/2021, 9:23 AM

## 2021-04-16 NOTE — Progress Notes (Addendum)
[facility] for Heparin >> Xarelto   Indication: atrial fibrillation  No Known Allergies  Patient Measurements: Height: '6\' 2"'$  (188 cm) Weight: (!) 143.3 kg (315 lb 14.4 oz) IBW/kg (Calculated) : 82.2 Heparin Dosing Weight: 115.5 kg  Vital Signs: Temp: 97.8 F (36.6 C) (08/13 0747) Temp Source: Axillary (08/13 0747) BP: 114/79 (08/13 0747) Pulse Rate: 94 (08/13 0747)  Labs: Recent Labs    04/15/21 1530 04/15/21 1704 04/15/21 1730 04/15/21 2324 04/16/21 0413 04/16/21 1002  HGB 17.1*  --   --   --  15.4  --   HCT 51.3  --   --   --  49.0  --   PLT 301  --   --   --  234  --   APTT  --  43*  --  47*  --  52*  HEPARINUNFRC  --  >1.10*  --  >1.10*  --  >1.10*  CREATININE 1.44*  --   --   --  1.16  --   TROPONINIHS 35*  --  112*  --   --   --      Estimated Creatinine Clearance: 120 mL/min (by C-G formula based on SCr of 1.16 mg/dL).   Medical History: Past Medical History:  Diagnosis Date   Atrial fibrillation with RVR (HCC)    Gastric ulcer    Gout    Hypertension    Paroxysmal A-fib (HCC)    Sleep apnea    USES CPAP   Wears glasses     Assessment: 45 YO male who presented with chest pain and has been in afib for the last 4 days. He had diaphoresis, SOB, and has been out of his metoprolol for 2 days. HR on arrival was 270, hgb 17, hct and plts WNL, Scr 1.44, trop 35. Patient is on Xarelto at home for afib and last known dose was this past week.  Echo on 8/13 resulted in an EF of 55-60% and Dr. Harrell Gave recommended transitioning to a DOAC as there are no plans for cath. CHADS-VASc = 2. Since patient was on Xarelto PTA, we will restart Xarelto 20 mg daily. CBC stable. Discussed dosing schedule with RN.   Goal of Therapy:  Monitor platelets by anticoagulation protocol: Yes   Plan:  Discontinue heparin gtt when give first dose of Xarelto - have communicated this with RN Restart Xarelto 20 mg daily. Will give with lunch  today then resume supper dosing schedule tomorrow Monitor CBC and s/sx bleeding daily  Pauletta Browns, Pharm.D. PGY-1 Ambulatory Care Resident [id number] 04/16/2021 10:58 AM

## 2021-04-16 NOTE — Progress Notes (Signed)
Patient will self place cpap when ready, RT will continue to monitor

## 2021-04-16 NOTE — Progress Notes (Signed)
  Echocardiogram 2D Echocardiogram has been performed.  Michiel Cowboy 04/16/2021, 9:54 AM

## 2021-04-16 NOTE — Progress Notes (Signed)
ANTICOAGULATION CONSULT NOTE   Pharmacy Consult for Heparin  Indication: atrial fibrillation  No Known Allergies  Patient Measurements: Height: '6\' 2"'$  (188 cm) Weight: (!) 143.3 kg (315 lb 14.4 oz) IBW/kg (Calculated) : 82.2 Heparin Dosing Weight: 115.5 kg  Vital Signs: Temp: 97.5 F (36.4 C) (08/12 2348) Temp Source: Axillary (08/12 2348) BP: 146/73 (08/12 2255) Pulse Rate: 86 (08/12 2348)  Labs: Recent Labs    04/15/21 1530 04/15/21 1704 04/15/21 1730 04/15/21 2324  HGB 17.1*  --   --   --   HCT 51.3  --   --   --   PLT 301  --   --   --   APTT  --  43*  --  47*  HEPARINUNFRC  --  >1.10*  --  >1.10*  CREATININE 1.44*  --   --   --   TROPONINIHS 35*  --  112*  --      Estimated Creatinine Clearance: 96.6 mL/min (A) (by C-G formula based on SCr of 1.44 mg/dL (H)).   Medical History: Past Medical History:  Diagnosis Date   Atrial fibrillation with RVR (HCC)    Gastric ulcer    Gout    Hypertension    Paroxysmal A-fib (HCC)    Sleep apnea    USES CPAP   Wears glasses     Assessment: 45 YO male who presented with chest pain and has been in afib for the last 4 days. He had diaphoresis, SOB, and has been out of his metoprolol for 2 days. HR on arrival was 270, hgb 17, hct and plts WNL, Scr 1.44, trop 35. Patient is on Xarelto at home for afib and last known dose was this past week.   8/13 AM update:  aPTT low  Goal of Therapy:  Heparin level 0.3-0.7 units/ml aPTT 66-102 secs Monitor platelets by anticoagulation protocol: Yes   Plan:  Inc heparin to 2000 units/hr 0930 aPTT/heparin level   Narda Bonds, PharmD, [place] Pharmacist Phone: [phone]

## 2021-04-17 DIAGNOSIS — I5031 Acute diastolic (congestive) heart failure: Secondary | ICD-10-CM

## 2021-04-17 LAB — BASIC METABOLIC PANEL
Anion gap: 9 (ref 5–15)
BUN: 16 mg/dL (ref 6–20)
CO2: 25 mmol/L (ref 22–32)
Calcium: 8.3 mg/dL — ABNORMAL LOW (ref 8.9–10.3)
Chloride: 103 mmol/L (ref 98–111)
Creatinine, Ser: 1.24 mg/dL (ref 0.61–1.24)
GFR, Estimated: >60 mL/min (ref >60)
Glucose, Bld: 157 mg/dL — ABNORMAL HIGH (ref 70–99)
Potassium: 3.3 mmol/L — ABNORMAL LOW (ref 3.5–5.1)
Sodium: 137 mmol/L (ref 135–145)

## 2021-04-17 LAB — CBC
HCT: 43.2 % (ref 39.0–52.0)
Hemoglobin: 14.5 g/dL (ref 13.0–17.0)
MCH: 28 pg (ref 26.0–34.0)
MCHC: 33.6 g/dL (ref 30.0–36.0)
MCV: 83.4 fL (ref 80.0–100.0)
Platelets: 190 10*3/uL (ref 150–400)
RBC: 5.18 MIL/uL (ref 4.22–5.81)
RDW: 18.4 % — ABNORMAL HIGH (ref 11.5–15.5)
WBC: 7 10*3/uL (ref 4.0–10.5)
nRBC: 0 % (ref 0.0–0.2)

## 2021-04-17 LAB — PROTIME-INR
INR: 1.1 (ref 0.8–1.2)
Prothrombin Time: 14.5 seconds (ref 11.4–15.2)

## 2021-04-17 MED ORDER — POTASSIUM CHLORIDE CRYS ER 20 MEQ PO TBCR
40.0000 meq | EXTENDED_RELEASE_TABLET | ORAL | Status: AC
  Administered 2021-04-17 (×2): 40 meq via ORAL
  Filled 2021-04-17 (×2): qty 2

## 2021-04-17 MED ORDER — HYDROCODONE-ACETAMINOPHEN 5-325 MG PO TABS
1.0000 | ORAL_TABLET | Freq: Four times a day (QID) | ORAL | Status: DC | PRN
  Administered 2021-04-17 – 2021-04-18 (×3): 1 via ORAL
  Filled 2021-04-17 (×3): qty 1

## 2021-04-17 MED ORDER — COLCHICINE 0.6 MG PO TABS
0.6000 mg | ORAL_TABLET | Freq: Once | ORAL | Status: AC
  Administered 2021-04-17: 0.6 mg via ORAL
  Filled 2021-04-17: qty 1

## 2021-04-17 MED ORDER — SODIUM CHLORIDE 0.9 % IV SOLN: INTRAVENOUS | Status: DC

## 2021-04-17 NOTE — Progress Notes (Signed)
   Contacted by the patient's nurse for gout pain along his left great toe and right knee. He has been receiving Allopurinol and Colchicine without much improvement. Tried a higher dose for a total of 1.'2mg'$  of Colchicine without much improvement either. NSAIDS are not ideal given he is on anticoagulation. Reviewed with Dr. Harrell Gave and will try to avoid steroids as well given his arrhythmia. Recommended trying PO pain medication temporarily. Will order Hydrocodone PRN Q6H.   Signed, Erma Heritage, PA-C 04/17/2021, 3:17 PM

## 2021-04-17 NOTE — H&P (View-Only) (Signed)
Progress Note  Patient Name: Samuel Flynn Date of Encounter: 04/17/2021  [facility] Cardiologist: Skeet Latch, MD   Subjective   No acute events overnight. Heart rate much improved. Amenable to procedure tomorrow.  Inpatient Medications    Scheduled Meds:  allopurinol  100 mg Oral Daily   furosemide  40 mg Intravenous Daily   pantoprazole  40 mg Oral Daily   rivaroxaban  20 mg Oral Q supper   sodium chloride flush  3 mL Intravenous Q12H   Continuous Infusions:  sodium chloride     diltiazem (CARDIZEM) infusion 15 mg/hr (04/17/21 0316)   PRN Meds: sodium chloride, acetaminophen, colchicine, hydrOXYzine, ondansetron (ZOFRAN) IV, sodium chloride flush, traZODone   Vital Signs    Vitals:   04/16/21 1354 04/16/21 2010 04/16/21 2011 04/17/21 0614  BP: 124/77 124/84  123/81  Pulse: 98 96  91  Resp: '20 18  17  '$ Temp: (!) 97.2 F (36.2 C) 98.3 F (36.8 C) 98.3 F (36.8 C) 98.3 F (36.8 C)  TempSrc: Axillary Oral  Oral  SpO2: 98% 96%  98%  Weight:    (!) 141.6 kg  Height:        Intake/Output Summary (Last 24 hours) at 04/17/2021 0918 Last data filed at 04/17/2021 0000 Gross per 24 hour  Intake 617.39 ml  Output --  Net 617.39 ml   Last 3 Weights 04/17/2021 04/15/2021 04/15/2021  Weight (lbs) 312 lb 1.6 oz 315 lb 14.4 oz 320 lb  Weight (kg) 141.568 kg 143.291 kg 145.151 kg      Telemetry    Atrial flutter, rates improved, currently in 4:1 flutter with V rates in the 70s - Personally Reviewed  ECG    04/15/21  atrial flutter at 140 bpm- Personally Reviewed  Physical Exam   GEN: Well nourished, well developed in no acute distress HEENT: Normal, moist mucous membranes NECK: No JVD CARDIAC: regular rhythm, normal S1 and S2, no rubs or gallops. No murmur. VASCULAR: Radial and DP pulses 2+ bilaterally. No carotid bruits RESPIRATORY:  Clear to auscultation without rales, wheezing or rhonchi  ABDOMEN: Soft, non-tender, non-distended MUSCULOSKELETAL:   Ambulates independently SKIN: Warm and dry, no significant LE edema NEUROLOGIC:  Alert and oriented x 3. No focal neuro deficits noted. PSYCHIATRIC:  Normal affect    Labs    High Sensitivity Troponin:   Recent Labs  Lab 04/15/21 1530 04/15/21 1730  TROPONINIHS 35* 112*      Chemistry Recent Labs  Lab 04/15/21 1530 04/15/21 1730 04/16/21 0413 04/17/21 0320  NA 137  --  137 137  K 3.8  --  3.9 3.3*  CL 102  --  103 103  CO2 20*  --  23 25  GLUCOSE 173*  --  137* 157*  BUN 14  --  15 16  CREATININE 1.44*  --  1.16 1.24  CALCIUM 9.2  --  8.4* 8.3*  PROT  --  6.5  --   --   ALBUMIN  --  3.5  --   --   AST  --  22  --   --   ALT  --  22  --   --   ALKPHOS  --  76  --   --   BILITOT  --  1.0  --   --   GFRNONAA >60  --  >60 >60  ANIONGAP 15  --  11 9     Hematology Recent Labs  Lab 04/15/21 1530 04/16/21 0413  04/17/21 0320  WBC 11.9* 7.7 7.0  RBC 6.11* 5.51 5.18  HGB 17.1* 15.4 14.5  HCT 51.3 49.0 43.2  MCV 84.0 88.9 83.4  MCH 28.0 27.9 28.0  MCHC 33.3 31.4 33.6  RDW 18.9* 19.1* 18.4*  PLT 301 234 190    BNPNo results for input(s): BNP, PROBNP in the last 168 hours.   DDimer No results for input(s): DDIMER in the last 168 hours.   Radiology    DG Chest Port 1 View  Result Date: 04/15/2021 CLINICAL DATA:  Chest pain EXAM: PORTABLE CHEST 1 VIEW COMPARISON:  Chest radiograph 01/28/2020 FINDINGS: Defibrillator pads overlie the chest. The heart is enlarged, similar to the prior study. Mediastinal contours are within normal limits. There is no focal consolidation or pulmonary edema. There is no pleural effusion or pneumothorax. There is no acute osseous abnormality. IMPRESSION: Unchanged cardiomegaly. Otherwise, no radiographic evidence of acute cardiopulmonary process. Electronically Signed   By: Valetta Mole M.D.   On: 04/15/2021 16:21   ECHOCARDIOGRAM COMPLETE  Result Date: 04/16/2021    ECHOCARDIOGRAM REPORT   Patient Name:   Samuel Flynn [facility] Date of  Exam: 04/16/2021 Medical Rec #:  [id number]          Height:       74.0 in Accession #:    [id number]         Weight:       315.9 lb Date of Birth:  August 13, 1976         BSA:          2.640 m Patient Age:    45 years           BP:           114/79 mmHg Patient Gender: M                  HR:           94 bpm. Exam Location:  Inpatient Procedure: 2D Echo, Cardiac Doppler, Color Doppler and Intracardiac            Opacification Agent Indications:    Atrial Fibrillation I48.91  History:        Patient has prior history of Echocardiogram examinations, most                 recent 07/14/2019. Arrythmias:Atrial Fibrillation; Risk                 Factors:Hypertension, Sleep Apnea and Former Smoker.  Sonographer:    Vickie Epley RDCS Referring Phys: [phone] [place] Comments: Image acquisition challenging due to patient body habitus. IMPRESSIONS  1. Left ventricular ejection fraction, by estimation, is 55 to 60%. The left ventricle has normal function. The left ventricle has no regional wall motion abnormalities. The left ventricular internal cavity size was mildly dilated. There is mild left ventricular hypertrophy. Left ventricular diastolic parameters are indeterminate.  2. Right ventricular systolic function is normal. The right ventricular size is normal.  3. The mitral valve is normal in structure. No evidence of mitral valve regurgitation. No evidence of mitral stenosis.  4. The aortic valve is tricuspid. Aortic valve regurgitation is not visualized. No aortic stenosis is present.  5. The inferior vena cava is normal in size with greater than 50% respiratory variability, suggesting right atrial pressure of 3 mmHg. FINDINGS  Left Ventricle: Left ventricular ejection fraction, by estimation, is 55 to 60%. The left ventricle has normal function. The left ventricle has no regional  wall motion abnormalities. Definity contrast agent was given IV to delineate the left ventricular  endocardial borders. The left  ventricular internal cavity size was mildly dilated. There is mild left ventricular hypertrophy. Left ventricular diastolic parameters are indeterminate. Right Ventricle: The right ventricular size is normal. Right ventricular systolic function is normal. Left Atrium: Left atrial size was normal in size. Right Atrium: Right atrial size was normal in size. Pericardium: There is no evidence of pericardial effusion. Mitral Valve: The mitral valve is normal in structure. No evidence of mitral valve regurgitation. No evidence of mitral valve stenosis. Tricuspid Valve: The tricuspid valve is normal in structure. Tricuspid valve regurgitation is trivial. No evidence of tricuspid stenosis. Aortic Valve: The aortic valve is tricuspid. Aortic valve regurgitation is not visualized. No aortic stenosis is present. Pulmonic Valve: The pulmonic valve was not well visualized. Pulmonic valve regurgitation is not visualized. No evidence of pulmonic stenosis. Aorta: The aortic root is normal in size and structure. Venous: The inferior vena cava is normal in size with greater than 50% respiratory variability, suggesting right atrial pressure of 3 mmHg. IAS/Shunts: No atrial level shunt detected by color flow Doppler.  LEFT VENTRICLE PLAX 2D LVIDd:         5.90 cm      Diastology LVIDs:         4.70 cm      LV e' medial:    6.83 cm/s LV PW:         1.20 cm      LV E/e' medial:  7.8 LV IVS:        1.20 cm      LV e' lateral:   6.30 cm/s LVOT diam:     2.60 cm      LV E/e' lateral: 8.5 LV SV:         49 LV SV Index:   18 LVOT Area:     5.31 cm  LV Volumes (MOD) LV vol d, MOD A4C: 152.0 ml LV vol s, MOD A4C: 61.8 ml LV SV MOD A4C:     152.0 ml RIGHT VENTRICLE RV S prime:     8.48 cm/s TAPSE (M-mode): 1.2 cm LEFT ATRIUM             Index       RIGHT ATRIUM           Index LA diam:        4.40 cm 1.67 cm/m  RA Area:     17.00 cm LA Vol (A2C):   44.1 ml 16.71 ml/m RA Volume:   42.40 ml  16.06 ml/m LA Vol (A4C):   36.7 ml 13.90 ml/m LA  Biplane Vol: 41.2 ml 15.61 ml/m  AORTIC VALVE LVOT Vmax:   51.80 cm/s LVOT Vmean:  36.300 cm/s LVOT VTI:    0.092 m  AORTA Ao Root diam: 3.70 cm Ao Asc diam:  3.40 cm MITRAL VALVE MV Area (PHT): 5.79 cm    SHUNTS MV Decel Time: 131 msec    Systemic VTI:  0.09 m MV E velocity: 53.50 cm/s  Systemic Diam: 2.60 cm MV A velocity: 47.40 cm/s MV E/A ratio:  1.13 Kirk Ruths MD Electronically signed by Kirk Ruths MD Signature Date/Time: 04/16/2021/10:32:15 AM    Final     Cardiac Studies   Echo 04/16/21  1. Left ventricular ejection fraction, by estimation, is 55 to 60%. The  left ventricle has normal function. The left ventricle has no regional  wall motion abnormalities.  The left ventricular internal cavity size was  mildly dilated. There is mild left  ventricular hypertrophy. Left ventricular diastolic parameters are  indeterminate.   2. Right ventricular systolic function is normal. The right ventricular  size is normal.   3. The mitral valve is normal in structure. No evidence of mitral valve  regurgitation. No evidence of mitral stenosis.   4. The aortic valve is tricuspid. Aortic valve regurgitation is not  visualized. No aortic stenosis is present.   5. The inferior vena cava is normal in size with greater than 50%  respiratory variability, suggesting right atrial pressure of 3 mmHg.   Patient Profile     45 y.o. male with PMH AF s/p prior ablation, PSA on CPAP admitted for atypical flutter.   Assessment & Plan    Atrial flutter -CHA2DS2/VAS Stroke Risk Points=2  -has been off of anticoagulation prior to admission -started on heparin this admission, transitioned to rivaroxaban 04/16/21 when echo showed normal EF -planned for TEE-CV 04/18/21, NPO at midnight -rate controlled on diltiazem drip. Continue until cardioversion, then can consolidate based on resting heart rate  Acute diastolic heart failure -echo as above, preserved EF -furosemide 40 mg IV daily. Charted as  net  even, admission weight 143.3 kg, weight today 141.6 kg -will start SGLT2i prior to discharge  Elevated troponin: -suspect demand in the setting of RVR and heart failure -hsTn 35 > 112 -prior imaging with no coronary calcium calcium, no known CAD  Type II diabetes: -A1c 6.9, meets criteria for diabetes  For questions or updates, please contact [place] [facility] Please consult [email or web address] for contact info under        Signed, Buford Dresser, MD  04/17/2021, 9:18 AM

## 2021-04-17 NOTE — Progress Notes (Signed)
Progress Note  Patient Name: Samuel Flynn Date of Encounter: 04/17/2021  [facility] Cardiologist: Skeet Latch, MD   Subjective   No acute events overnight. Heart rate much improved. Amenable to procedure tomorrow.  Inpatient Medications    Scheduled Meds:  allopurinol  100 mg Oral Daily   furosemide  40 mg Intravenous Daily   pantoprazole  40 mg Oral Daily   rivaroxaban  20 mg Oral Q supper   sodium chloride flush  3 mL Intravenous Q12H   Continuous Infusions:  sodium chloride     diltiazem (CARDIZEM) infusion 15 mg/hr (04/17/21 0316)   PRN Meds: sodium chloride, acetaminophen, colchicine, hydrOXYzine, ondansetron (ZOFRAN) IV, sodium chloride flush, traZODone   Vital Signs    Vitals:   04/16/21 1354 04/16/21 2010 04/16/21 2011 04/17/21 0614  BP: 124/77 124/84  123/81  Pulse: 98 96  91  Resp: '20 18  17  '$ Temp: (!) 97.2 F (36.2 C) 98.3 F (36.8 C) 98.3 F (36.8 C) 98.3 F (36.8 C)  TempSrc: Axillary Oral  Oral  SpO2: 98% 96%  98%  Weight:    (!) 141.6 kg  Height:        Intake/Output Summary (Last 24 hours) at 04/17/2021 0918 Last data filed at 04/17/2021 0000 Gross per 24 hour  Intake 617.39 ml  Output --  Net 617.39 ml   Last 3 Weights 04/17/2021 04/15/2021 04/15/2021  Weight (lbs) 312 lb 1.6 oz 315 lb 14.4 oz 320 lb  Weight (kg) 141.568 kg 143.291 kg 145.151 kg      Telemetry    Atrial flutter, rates improved, currently in 4:1 flutter with V rates in the 70s - Personally Reviewed  ECG    04/15/21  atrial flutter at 140 bpm- Personally Reviewed  Physical Exam   GEN: Well nourished, well developed in no acute distress HEENT: Normal, moist mucous membranes NECK: No JVD CARDIAC: regular rhythm, normal S1 and S2, no rubs or gallops. No murmur. VASCULAR: Radial and DP pulses 2+ bilaterally. No carotid bruits RESPIRATORY:  Clear to auscultation without rales, wheezing or rhonchi  ABDOMEN: Soft, non-tender, non-distended MUSCULOSKELETAL:   Ambulates independently SKIN: Warm and dry, no significant LE edema NEUROLOGIC:  Alert and oriented x 3. No focal neuro deficits noted. PSYCHIATRIC:  Normal affect    Labs    High Sensitivity Troponin:   Recent Labs  Lab 04/15/21 1530 04/15/21 1730  TROPONINIHS 35* 112*      Chemistry Recent Labs  Lab 04/15/21 1530 04/15/21 1730 04/16/21 0413 04/17/21 0320  NA 137  --  137 137  K 3.8  --  3.9 3.3*  CL 102  --  103 103  CO2 20*  --  23 25  GLUCOSE 173*  --  137* 157*  BUN 14  --  15 16  CREATININE 1.44*  --  1.16 1.24  CALCIUM 9.2  --  8.4* 8.3*  PROT  --  6.5  --   --   ALBUMIN  --  3.5  --   --   AST  --  22  --   --   ALT  --  22  --   --   ALKPHOS  --  76  --   --   BILITOT  --  1.0  --   --   GFRNONAA >60  --  >60 >60  ANIONGAP 15  --  11 9     Hematology Recent Labs  Lab 04/15/21 1530 04/16/21 0413  04/17/21 0320  WBC 11.9* 7.7 7.0  RBC 6.11* 5.51 5.18  HGB 17.1* 15.4 14.5  HCT 51.3 49.0 43.2  MCV 84.0 88.9 83.4  MCH 28.0 27.9 28.0  MCHC 33.3 31.4 33.6  RDW 18.9* 19.1* 18.4*  PLT 301 234 190    BNPNo results for input(s): BNP, PROBNP in the last 168 hours.   DDimer No results for input(s): DDIMER in the last 168 hours.   Radiology    DG Chest Port 1 View  Result Date: 04/15/2021 CLINICAL DATA:  Chest pain EXAM: PORTABLE CHEST 1 VIEW COMPARISON:  Chest radiograph 01/28/2020 FINDINGS: Defibrillator pads overlie the chest. The heart is enlarged, similar to the prior study. Mediastinal contours are within normal limits. There is no focal consolidation or pulmonary edema. There is no pleural effusion or pneumothorax. There is no acute osseous abnormality. IMPRESSION: Unchanged cardiomegaly. Otherwise, no radiographic evidence of acute cardiopulmonary process. Electronically Signed   By: Valetta Mole M.D.   On: 04/15/2021 16:21   ECHOCARDIOGRAM COMPLETE  Result Date: 04/16/2021    ECHOCARDIOGRAM REPORT   Patient Name:   Samuel Flynn [facility] Date of  Exam: 04/16/2021 Medical Rec #:  [id number]          Height:       74.0 in Accession #:    [id number]         Weight:       315.9 lb Date of Birth:  1975-10-26         BSA:          2.640 m Patient Age:    45 years           BP:           114/79 mmHg Patient Gender: M                  HR:           94 bpm. Exam Location:  Inpatient Procedure: 2D Echo, Cardiac Doppler, Color Doppler and Intracardiac            Opacification Agent Indications:    Atrial Fibrillation I48.91  History:        Patient has prior history of Echocardiogram examinations, most                 recent 07/14/2019. Arrythmias:Atrial Fibrillation; Risk                 Factors:Hypertension, Sleep Apnea and Former Smoker.  Sonographer:    Vickie Epley RDCS Referring Phys: [phone] [place] Comments: Image acquisition challenging due to patient body habitus. IMPRESSIONS  1. Left ventricular ejection fraction, by estimation, is 55 to 60%. The left ventricle has normal function. The left ventricle has no regional wall motion abnormalities. The left ventricular internal cavity size was mildly dilated. There is mild left ventricular hypertrophy. Left ventricular diastolic parameters are indeterminate.  2. Right ventricular systolic function is normal. The right ventricular size is normal.  3. The mitral valve is normal in structure. No evidence of mitral valve regurgitation. No evidence of mitral stenosis.  4. The aortic valve is tricuspid. Aortic valve regurgitation is not visualized. No aortic stenosis is present.  5. The inferior vena cava is normal in size with greater than 50% respiratory variability, suggesting right atrial pressure of 3 mmHg. FINDINGS  Left Ventricle: Left ventricular ejection fraction, by estimation, is 55 to 60%. The left ventricle has normal function. The left ventricle has no regional  wall motion abnormalities. Definity contrast agent was given IV to delineate the left ventricular  endocardial borders. The left  ventricular internal cavity size was mildly dilated. There is mild left ventricular hypertrophy. Left ventricular diastolic parameters are indeterminate. Right Ventricle: The right ventricular size is normal. Right ventricular systolic function is normal. Left Atrium: Left atrial size was normal in size. Right Atrium: Right atrial size was normal in size. Pericardium: There is no evidence of pericardial effusion. Mitral Valve: The mitral valve is normal in structure. No evidence of mitral valve regurgitation. No evidence of mitral valve stenosis. Tricuspid Valve: The tricuspid valve is normal in structure. Tricuspid valve regurgitation is trivial. No evidence of tricuspid stenosis. Aortic Valve: The aortic valve is tricuspid. Aortic valve regurgitation is not visualized. No aortic stenosis is present. Pulmonic Valve: The pulmonic valve was not well visualized. Pulmonic valve regurgitation is not visualized. No evidence of pulmonic stenosis. Aorta: The aortic root is normal in size and structure. Venous: The inferior vena cava is normal in size with greater than 50% respiratory variability, suggesting right atrial pressure of 3 mmHg. IAS/Shunts: No atrial level shunt detected by color flow Doppler.  LEFT VENTRICLE PLAX 2D LVIDd:         5.90 cm      Diastology LVIDs:         4.70 cm      LV e' medial:    6.83 cm/s LV PW:         1.20 cm      LV E/e' medial:  7.8 LV IVS:        1.20 cm      LV e' lateral:   6.30 cm/s LVOT diam:     2.60 cm      LV E/e' lateral: 8.5 LV SV:         49 LV SV Index:   18 LVOT Area:     5.31 cm  LV Volumes (MOD) LV vol d, MOD A4C: 152.0 ml LV vol s, MOD A4C: 61.8 ml LV SV MOD A4C:     152.0 ml RIGHT VENTRICLE RV S prime:     8.48 cm/s TAPSE (M-mode): 1.2 cm LEFT ATRIUM             Index       RIGHT ATRIUM           Index LA diam:        4.40 cm 1.67 cm/m  RA Area:     17.00 cm LA Vol (A2C):   44.1 ml 16.71 ml/m RA Volume:   42.40 ml  16.06 ml/m LA Vol (A4C):   36.7 ml 13.90 ml/m LA  Biplane Vol: 41.2 ml 15.61 ml/m  AORTIC VALVE LVOT Vmax:   51.80 cm/s LVOT Vmean:  36.300 cm/s LVOT VTI:    0.092 m  AORTA Ao Root diam: 3.70 cm Ao Asc diam:  3.40 cm MITRAL VALVE MV Area (PHT): 5.79 cm    SHUNTS MV Decel Time: 131 msec    Systemic VTI:  0.09 m MV E velocity: 53.50 cm/s  Systemic Diam: 2.60 cm MV A velocity: 47.40 cm/s MV E/A ratio:  1.13 Kirk Ruths MD Electronically signed by Kirk Ruths MD Signature Date/Time: 04/16/2021/10:32:15 AM    Final     Cardiac Studies   Echo 04/16/21  1. Left ventricular ejection fraction, by estimation, is 55 to 60%. The  left ventricle has normal function. The left ventricle has no regional  wall motion abnormalities.  The left ventricular internal cavity size was  mildly dilated. There is mild left  ventricular hypertrophy. Left ventricular diastolic parameters are  indeterminate.   2. Right ventricular systolic function is normal. The right ventricular  size is normal.   3. The mitral valve is normal in structure. No evidence of mitral valve  regurgitation. No evidence of mitral stenosis.   4. The aortic valve is tricuspid. Aortic valve regurgitation is not  visualized. No aortic stenosis is present.   5. The inferior vena cava is normal in size with greater than 50%  respiratory variability, suggesting right atrial pressure of 3 mmHg.   Patient Profile     45 y.o. male with PMH AF s/p prior ablation, PSA on CPAP admitted for atypical flutter.   Assessment & Plan    Atrial flutter -CHA2DS2/VAS Stroke Risk Points=2  -has been off of anticoagulation prior to admission -started on heparin this admission, transitioned to rivaroxaban 04/16/21 when echo showed normal EF -planned for TEE-CV 04/18/21, NPO at midnight -rate controlled on diltiazem drip. Continue until cardioversion, then can consolidate based on resting heart rate  Acute diastolic heart failure -echo as above, preserved EF -furosemide 40 mg IV daily. Charted as  net  even, admission weight 143.3 kg, weight today 141.6 kg -will start SGLT2i prior to discharge  Elevated troponin: -suspect demand in the setting of RVR and heart failure -hsTn 35 > 112 -prior imaging with no coronary calcium calcium, no known CAD  Type II diabetes: -A1c 6.9, meets criteria for diabetes  For questions or updates, please contact [place] [facility] Please consult [email or web address] for contact info under        Signed, Buford Dresser, MD  04/17/2021, 9:18 AM

## 2021-04-18 ENCOUNTER — Encounter (HOSPITAL_COMMUNITY)
Admission: EM | Disposition: A | Discharge status: Home / Self Care | Payer: Self-pay | Source: Home / Self Care | Attending: Cardiology

## 2021-04-18 ENCOUNTER — Inpatient Hospital Stay (HOSPITAL_COMMUNITY): Payer: Medicaid Other | Admitting: Certified Registered Nurse Anesthetist

## 2021-04-18 ENCOUNTER — Other Ambulatory Visit: Payer: Self-pay

## 2021-04-18 ENCOUNTER — Inpatient Hospital Stay (HOSPITAL_COMMUNITY): Payer: Medicaid Other

## 2021-04-18 ENCOUNTER — Other Ambulatory Visit (HOSPITAL_COMMUNITY): Payer: Self-pay

## 2021-04-18 DIAGNOSIS — I4891 Unspecified atrial fibrillation: Secondary | ICD-10-CM

## 2021-04-18 DIAGNOSIS — E119 Type 2 diabetes mellitus without complications: Secondary | ICD-10-CM

## 2021-04-18 DIAGNOSIS — M1000 Idiopathic gout, unspecified site: Secondary | ICD-10-CM

## 2021-04-18 DIAGNOSIS — I483 Typical atrial flutter: Secondary | ICD-10-CM

## 2021-04-18 DIAGNOSIS — I340 Nonrheumatic mitral (valve) insufficiency: Secondary | ICD-10-CM

## 2021-04-18 HISTORY — PX: TEE WITHOUT CARDIOVERSION: SHX5443

## 2021-04-18 HISTORY — PX: CARDIOVERSION: SHX1299

## 2021-04-18 LAB — CBC
HCT: 41.6 % (ref 39.0–52.0)
Hemoglobin: 14.1 g/dL (ref 13.0–17.0)
MCH: 28.2 pg (ref 26.0–34.0)
MCHC: 33.9 g/dL (ref 30.0–36.0)
MCV: 83.2 fL (ref 80.0–100.0)
Platelets: 213 10*3/uL (ref 150–400)
RBC: 5 MIL/uL (ref 4.22–5.81)
RDW: 18 % — ABNORMAL HIGH (ref 11.5–15.5)
WBC: 7.9 10*3/uL (ref 4.0–10.5)
nRBC: 0 % (ref 0.0–0.2)

## 2021-04-18 LAB — BASIC METABOLIC PANEL
Anion gap: 11 (ref 5–15)
BUN: 14 mg/dL (ref 6–20)
CO2: 25 mmol/L (ref 22–32)
Calcium: 8.5 mg/dL — ABNORMAL LOW (ref 8.9–10.3)
Chloride: 102 mmol/L (ref 98–111)
Creatinine, Ser: 1.3 mg/dL — ABNORMAL HIGH (ref 0.61–1.24)
GFR, Estimated: >60 mL/min (ref >60)
Glucose, Bld: 143 mg/dL — ABNORMAL HIGH (ref 70–99)
Potassium: 4.1 mmol/L (ref 3.5–5.1)
Sodium: 138 mmol/L (ref 135–145)

## 2021-04-18 SURGERY — ECHOCARDIOGRAM, TRANSESOPHAGEAL
Anesthesia: Monitor Anesthesia Care

## 2021-04-18 MED ORDER — ACETAMINOPHEN 325 MG PO TABS: 650.0000 mg | ORAL_TABLET | ORAL | Status: DC | PRN

## 2021-04-18 MED ORDER — DILTIAZEM HCL ER COATED BEADS 300 MG PO CP24
300.0000 mg | ORAL_CAPSULE | Freq: Every day | ORAL | 2 refills | Status: DC
  Filled 2021-04-19: qty 30, 30d supply, fill #0
  Filled 2021-05-15: qty 30, 30d supply, fill #1
  Filled 2021-06-18: qty 30, 30d supply, fill #2

## 2021-04-18 MED ORDER — KETOROLAC TROMETHAMINE 30 MG/ML IJ SOLN
30.0000 mg | Freq: Three times a day (TID) | INTRAMUSCULAR | Status: DC | PRN
  Administered 2021-04-18: 30 mg via INTRAVENOUS
  Filled 2021-04-18 (×2): qty 1

## 2021-04-18 MED ORDER — PROPOFOL 500 MG/50ML IV EMUL
INTRAVENOUS | Status: DC | PRN
  Administered 2021-04-18: 125 ug/kg/min via INTRAVENOUS

## 2021-04-18 MED ORDER — DILTIAZEM HCL ER COATED BEADS 180 MG PO CP24
300.0000 mg | ORAL_CAPSULE | Freq: Every day | ORAL | Status: DC
  Administered 2021-04-18: 300 mg via ORAL
  Filled 2021-04-18: qty 1

## 2021-04-18 MED ORDER — ALLOPURINOL 100 MG PO TABS
100.0000 mg | ORAL_TABLET | Freq: Every day | ORAL | 0 refills | Status: DC
  Filled 2021-04-19: qty 30, 30d supply, fill #0

## 2021-04-18 MED ORDER — PROPOFOL 10 MG/ML IV BOLUS
INTRAVENOUS | Status: DC | PRN
  Administered 2021-04-18 (×6): 20 mg via INTRAVENOUS

## 2021-04-18 MED ORDER — DAPAGLIFLOZIN PROPANEDIOL 10 MG PO TABS
10.0000 mg | ORAL_TABLET | Freq: Every day | ORAL | 2 refills | Status: DC
  Filled 2021-04-19: qty 30, 30d supply, fill #0
  Filled 2021-05-16: qty 30, 30d supply, fill #1

## 2021-04-18 MED ORDER — DAPAGLIFLOZIN PROPANEDIOL 10 MG PO TABS
10.0000 mg | ORAL_TABLET | Freq: Every day | ORAL | Status: DC
  Administered 2021-04-18: 10 mg via ORAL
  Filled 2021-04-18: qty 1

## 2021-04-18 MED ORDER — LIDOCAINE 2% (20 MG/ML) 5 ML SYRINGE
INTRAMUSCULAR | Status: DC | PRN
  Administered 2021-04-18: 100 mg via INTRAVENOUS

## 2021-04-18 MED ORDER — XARELTO 20 MG PO TABS
20.0000 mg | ORAL_TABLET | Freq: Every day | ORAL | 2 refills | Status: DC
  Filled 2021-04-19: qty 30, 30d supply, fill #0
  Filled 2021-05-16: qty 30, 30d supply, fill #1
  Filled 2021-06-18: qty 30, 30d supply, fill #2

## 2021-04-18 NOTE — CV Procedure (Signed)
    Transesophageal Echocardiogram Note  Samuel Flynn [id number] 08-Sep-1975  Procedure: Transesophageal Echocardiogram Indications: atrial flutter   Procedure Details Consent: Obtained Time Out: Verified patient identification, verified procedure, site/side was marked, verified correct patient position, special equipment/implants available, Radiology Safety Procedures followed,  medications/allergies/relevent history reviewed, required imaging and test results available.  Performed  Medications:  During this procedure the patient is administered Lidocaine 100  mg IV followed by propofol drip - total of 484 mg for the TEE and CV.  The patient's heart rate, blood pressure, and oxygen saturation are monitored continuously during the procedure. The period of conscious sedation is 45  minutes, of which I was present face-to-face 100% of this time.  Left Ventrical:  normal LV function .  Mitral Valve: mild MR   Aortic Valve: normal 3 leaflet valve, no AS o rAI   Tricuspid Valve: normal valve,   Pulmonic Valve: normal   Left Atrium/ Left atrial appendage: no thrombi,   Atrial septum: no obvious asd or PFO with color doppler   Aorta: normal.   Complications: No apparent complications Patient did tolerate procedure well.      Cardioversion Note  Samuel Flynn [id number] 1976-03-10  Procedure: DC Cardioversion Indications: atrial flutter   Procedure Details Consent: Obtained Time Out: Verified patient identification, verified procedure, site/side was marked, verified correct patient position, special equipment/implants available, Radiology Safety Procedures followed,  medications/allergies/relevent history reviewed, required imaging and test results available.  Performed  The patient has been on adequate anticoagulation.  The patient received Lidocaine and propofol ( see above)  for sedation.  Synchronous cardioversion was performed at 200  joules.  The  cardioversion was successful.     Complications: No apparent complications Patient did tolerate procedure well.   Thayer Headings, Brooke Bonito., MD, [facility] 04/18/2021, 9:01 AM

## 2021-04-18 NOTE — Interval H&P Note (Signed)
History and Physical Interval Note:  04/18/2021 8:15 AM  Samuel Flynn  has presented today for surgery, with the diagnosis of AFLUTTER.  The various methods of treatment have been discussed with the patient and family. After consideration of risks, benefits and other options for treatment, the patient has consented to  Procedure(s): TRANSESOPHAGEAL ECHOCARDIOGRAM (TEE) (N/A) CARDIOVERSION (N/A) as a surgical intervention.  The patient's history has been reviewed, patient examined, no change in status, stable for surgery.  I have reviewed the patient's chart and labs.  Questions were answered to the patient's satisfaction.     [name]

## 2021-04-18 NOTE — Discharge Summary (Signed)
.  Discharge Summary    Patient ID: Samuel Flynn MRN: [id number]; DOB: 1976/06/03  Admit date: 04/15/2021 Discharge date: 04/18/2021  PCP:  Nicolette Bang, MD   [facility] Providers Cardiologist:  Skeet Latch, MD  Electrophysiologist:  Constance Haw, MD  {   Discharge Diagnoses    Active Problems:   Atrial flutter with rapid ventricular response (HCC)   OSA (obstructive sleep apnea)   Atrial flutter [facility])   Atrial fibrillation with rapid ventricular response ([place])   Heart failure, type unknown ([place])    Diagnostic Studies/Procedures    TEE from 04/18/21:   Left Ventrical:  normal LV function .   Mitral Valve: mild MR    Aortic Valve: normal 3 leaflet valve, no AS o rAI    Tricuspid Valve: normal valve,    Pulmonic Valve: normal    Left Atrium/ Left atrial appendage: no thrombi,    Atrial septum: no obvious asd or PFO with color doppler    Aorta: normal.   Complications: No apparent complications Patient did tolerate procedure well.     DCCV on 04/18/21:   Procedure Details Consent: Obtained Time Out: Verified patient identification, verified procedure, site/side was marked, verified correct patient position, special equipment/implants available, Radiology Safety Procedures followed,  medications/allergies/relevent history reviewed, required imaging and test results available.  Performed   The patient has been on adequate anticoagulation.  The patient received Lidocaine and propofol ( see above)  for sedation.  Synchronous cardioversion was performed at 200  joules.   The cardioversion was successful.    Complications: No apparent complications Patient did tolerate procedure well.   Echo 04/16/21    1. Left ventricular ejection fraction, by estimation, is 55 to 60%. The  left ventricle has normal function. The left ventricle has no regional  wall motion abnormalities. The left ventricular internal cavity size was  mildly  dilated. There is mild left ventricular hypertrophy. Left ventricular diastolic parameters are  indeterminate.   2. Right ventricular systolic function is normal. The right ventricular  size is normal.   3. The mitral valve is normal in structure. No evidence of mitral valve  regurgitation. No evidence of mitral stenosis.   4. The aortic valve is tricuspid. Aortic valve regurgitation is not  visualized. No aortic stenosis is present.   5. The inferior vena cava is normal in size with greater than 50%  respiratory variability, suggesting right atrial pressure of 3 mmHg.    _____________   History of Present Illness     Per H&P from 04/15/21:  Samuel Flynn noted that he is feeling unwell for the past one month. Family member noted that for the past month he has had leg swelling, abdominal swelling,  DOE, SOB, and weight gain.  Noted PND and orthopnea. This had progressively getting worse, despite no dietary changes or known exacerbating factors.  The only medication patient presently takes is lasix 40 mg PO Daily; and this has not helped.    For the past four days (only, not with the above) he has noted palpitations.  Heart rate had been going high both at rest an with minimal activity. No near syncope or syncope.  Reason for ED Eval was that this has lead to chest pain.  Chest pain and pressure in the center of his heart.  Patient notes that he was taken off his blood thinner post 2021 AF ablation.   In the ED Heart rates as high as 220. Given 20 IV  diltiazem; slowed to 140.  Has persistent ST depression in the anterolateral leads.  Cardiology called for evaluation.   Patient noted that despite the decrease in heart rate he still have chest pain.  Felt hard to catch his breath.  Denies the chest pain prior to 4 days ago.  No syncope presently.  Hospital Course     Consultants: N/A   Atrial flutter Hx of persistent atrial fibrillation  - presented with unwell feeling for 1 month, leg  swelling, abd swelling, DOE, SOB, weight gain, PND, orthopnea, heart palpitation  - EKG at admission showed A flutter 2:1 with ventricular rate of 140s  - Echo showed EF 55-60%, no RWMA, mild LVH, no valvular disease  - CHA2DS2/VAS 2, has been off of anticoagulation since 03/22/20 after EP office visit; started on heparin gtt this admission, transitioned to rivaroxaban 04/16/21, tolerating  - rate controlled on diltiazem drip initially, s/p TEE with DCCV today, normal LV function, mild MR, no LA/LAA thrombus, no ASD or PFO; successful conversion to SR, stated on PO Cardizem '300mg'$  daily after procedure, home metoprolol XL has been discontinued, discussed with MD, will continue PO Cardizem '300mg'$  daily at DC for rate control  - follow up with cardiology at [facility] office arranged on [id number]    Acute diastolic heart failure - Echo as above, preserved EF - continued on furosemide 40 mg IV daily, was on PO lasix '40mg'$  daily at home without improvement  - I&O not accurately documented, weight is 320 >315 ib since admission  - clinically euvolemic today, will transition to PO lasix '40mg'$  daily at DC (was on BID at home) - will start [place] '10mg'$  daily at DC  - follow up with cardiology at [facility] office arranged on 05/05/21 , may consider repeat BMP at office visit    Elevated troponin -suspect demand in the setting of RVR and heart failure -hsTn 35 > 112 -prior imaging with no coronary calcium calcium, no known CAD   HTN - BP overall controlled, home meds metoprolol XL '100mg'$  daily stopped, started PO cardizem '300mg'$  daily    Type II diabetes, new /presumed  -A1c 6.9 this admission  - Will start Farxiga '10mg'$  daily at DC, defer metformin to PCP,   follow up with PCP in 1-2 weeks   Acute pain of left great toe and right knee/leg Gouty arthritis  - suppose to take allopurinol '100mg'$  daily at home but no refills and had been off for months, this was started at admission and will be continued for now, script will be  sent  - s/p colchicine load without adequate relief (probably too late in the course), currently on PRN hydrocodone, attempt to avoid steroid due to arrhythmia per MD  -continue PRN tylenol at home, follow up with PCP in 1-2 weeks   OSA - CPAP continued at night      Did the patient have an acute coronary syndrome (MI, NSTEMI, STEMI, etc) this admission?:  No                               Did the patient have a percutaneous coronary intervention (stent / angioplasty)?:  No.       _____________  Discharge Vitals Blood pressure 127/74, pulse 61, temperature 98.4 F (36.9 C), temperature source Oral, resp. rate 18, height '6\' 2"'$  (1.88 m), weight (!) 142.9 kg, SpO2 98 %.  Filed Weights   04/17/21 0614 04/18/21 0500 04/18/21 0736  Weight: (!) 141.6 kg (!) 141.6 kg (!) 142.9 kg    Labs & Radiologic Studies    CBC Recent Labs    04/17/21 0320 04/18/21 0235  WBC 7.0 7.9  HGB 14.5 14.1  HCT 43.2 41.6  MCV 83.4 83.2  PLT 190 [id number]   Basic Metabolic Panel Recent Labs    04/15/21 1530 04/16/21 0413 04/17/21 0320 04/18/21 0235  NA 137   < > 137 138  K 3.8   < > 3.3* 4.1  CL 102   < > 103 102  CO2 20*   < > 25 25  GLUCOSE 173*   < > 157* 143*  BUN 14   < > 16 14  CREATININE 1.44*   < > 1.24 1.30*  CALCIUM 9.2   < > 8.3* 8.5*  MG 2.2  --   --   --    < > = values in this interval not displayed.   Liver Function Tests Recent Labs    04/15/21 1730  AST 22  ALT 22  ALKPHOS 76  BILITOT 1.0  PROT 6.5  ALBUMIN 3.5   No results for input(s): LIPASE, AMYLASE in the last 72 hours. High Sensitivity Troponin:   Recent Labs  Lab 04/15/21 1530 04/15/21 1730  TROPONINIHS 35* 112*    BNP Invalid input(s): POCBNP D-Dimer No results for input(s): DDIMER in the last 72 hours. Hemoglobin A1C Recent Labs    04/15/21 1736  HGBA1C 6.9*   Fasting Lipid Panel Recent Labs    04/16/21 0413  CHOL 203*  HDL 24*  LDLCALC UNABLE TO CALCULATE IF TRIGLYCERIDE OVER 400 mg/dL   TRIG 428*  CHOLHDL 8.5  LDLDIRECT 107.0*   Thyroid Function Tests Recent Labs    04/16/21 0413  TSH 2.620   _____________  DG Chest Port 1 View  Result Date: 04/15/2021 CLINICAL DATA:  Chest pain EXAM: PORTABLE CHEST 1 VIEW COMPARISON:  Chest radiograph 01/28/2020 FINDINGS: Defibrillator pads overlie the chest. The heart is enlarged, similar to the prior study. Mediastinal contours are within normal limits. There is no focal consolidation or pulmonary edema. There is no pleural effusion or pneumothorax. There is no acute osseous abnormality. IMPRESSION: Unchanged cardiomegaly. Otherwise, no radiographic evidence of acute cardiopulmonary process. Electronically Signed   By: Valetta Mole M.D.   On: 04/15/2021 16:21   ECHOCARDIOGRAM COMPLETE  Result Date: 04/16/2021    ECHOCARDIOGRAM REPORT   Patient Name:   Samuel Flynn [facility] Date of Exam: 04/16/2021 Medical Rec #:  [id number]          Height:       74.0 in Accession #:    [id number]         Weight:       315.9 lb Date of Birth:  May 20, 1976         BSA:          2.640 m Patient Age:    60 years           BP:           114/79 mmHg Patient Gender: M                  HR:           94 bpm. Exam Location:  Inpatient Procedure: 2D Echo, Cardiac Doppler, Color Doppler and Intracardiac            Opacification Agent Indications:    Atrial Fibrillation I48.91  History:  Patient has prior history of Echocardiogram examinations, most                 recent 07/14/2019. Arrythmias:Atrial Fibrillation; Risk                 Factors:Hypertension, Sleep Apnea and Former Smoker.  Sonographer:    Vickie Epley RDCS Referring Phys: [phone] [place] Comments: Image acquisition challenging due to patient body habitus. IMPRESSIONS  1. Left ventricular ejection fraction, by estimation, is 55 to 60%. The left ventricle has normal function. The left ventricle has no regional wall motion abnormalities. The left ventricular internal cavity size was mildly  dilated. There is mild left ventricular hypertrophy. Left ventricular diastolic parameters are indeterminate.  2. Right ventricular systolic function is normal. The right ventricular size is normal.  3. The mitral valve is normal in structure. No evidence of mitral valve regurgitation. No evidence of mitral stenosis.  4. The aortic valve is tricuspid. Aortic valve regurgitation is not visualized. No aortic stenosis is present.  5. The inferior vena cava is normal in size with greater than 50% respiratory variability, suggesting right atrial pressure of 3 mmHg. FINDINGS  Left Ventricle: Left ventricular ejection fraction, by estimation, is 55 to 60%. The left ventricle has normal function. The left ventricle has no regional wall motion abnormalities. Definity contrast agent was given IV to delineate the left ventricular  endocardial borders. The left ventricular internal cavity size was mildly dilated. There is mild left ventricular hypertrophy. Left ventricular diastolic parameters are indeterminate. Right Ventricle: The right ventricular size is normal. Right ventricular systolic function is normal. Left Atrium: Left atrial size was normal in size. Right Atrium: Right atrial size was normal in size. Pericardium: There is no evidence of pericardial effusion. Mitral Valve: The mitral valve is normal in structure. No evidence of mitral valve regurgitation. No evidence of mitral valve stenosis. Tricuspid Valve: The tricuspid valve is normal in structure. Tricuspid valve regurgitation is trivial. No evidence of tricuspid stenosis. Aortic Valve: The aortic valve is tricuspid. Aortic valve regurgitation is not visualized. No aortic stenosis is present. Pulmonic Valve: The pulmonic valve was not well visualized. Pulmonic valve regurgitation is not visualized. No evidence of pulmonic stenosis. Aorta: The aortic root is normal in size and structure. Venous: The inferior vena cava is normal in size with greater than 50%  respiratory variability, suggesting right atrial pressure of 3 mmHg. IAS/Shunts: No atrial level shunt detected by color flow Doppler.  LEFT VENTRICLE PLAX 2D LVIDd:         5.90 cm      Diastology LVIDs:         4.70 cm      LV e' medial:    6.83 cm/s LV PW:         1.20 cm      LV E/e' medial:  7.8 LV IVS:        1.20 cm      LV e' lateral:   6.30 cm/s LVOT diam:     2.60 cm      LV E/e' lateral: 8.5 LV SV:         49 LV SV Index:   18 LVOT Area:     5.31 cm  LV Volumes (MOD) LV vol d, MOD A4C: 152.0 ml LV vol s, MOD A4C: 61.8 ml LV SV MOD A4C:     152.0 ml RIGHT VENTRICLE RV S prime:     8.48 cm/s TAPSE (M-mode):  1.2 cm LEFT ATRIUM             Index       RIGHT ATRIUM           Index LA diam:        4.40 cm 1.67 cm/m  RA Area:     17.00 cm LA Vol (A2C):   44.1 ml 16.71 ml/m RA Volume:   42.40 ml  16.06 ml/m LA Vol (A4C):   36.7 ml 13.90 ml/m LA Biplane Vol: 41.2 ml 15.61 ml/m  AORTIC VALVE LVOT Vmax:   51.80 cm/s LVOT Vmean:  36.300 cm/s LVOT VTI:    0.092 m  AORTA Ao Root diam: 3.70 cm Ao Asc diam:  3.40 cm MITRAL VALVE MV Area (PHT): 5.79 cm    SHUNTS MV Decel Time: 131 msec    Systemic VTI:  0.09 m MV E velocity: 53.50 cm/s  Systemic Diam: 2.60 cm MV A velocity: 47.40 cm/s MV E/A ratio:  1.13 Kirk Ruths MD Electronically signed by Kirk Ruths MD Signature Date/Time: 04/16/2021/10:32:15 AM    Final    Disposition   Pt is being discharged home today in good condition.  Follow-up Plans & Appointments     Follow-up Information     [facility] Follow up on 05/05/2021.   Specialty: Cardiology Why: at 10AM for your post hospital follow up with cardiology, with Ms Gilford Rile, call [phone] for questions if any Contact information: [address] [place] [phone]               Discharge Instructions     Diet - low sodium heart healthy   Complete by: As directed    Discharge instructions   Complete by:  As directed    Please stop Metoprolol , start Cardizem '300mg'$  daily for your heart rate control   Please start Xarelto '20mg'$  daily for stroke prevention   Please start Farxiga '10mg'$  daily for diabetes, follow up with your PCP in 1-2 weeks for diabetes, gout   Please follow up with cardiology on 05/05/21, bring all your medications for review   Increase activity slowly   Complete by: As directed        Discharge Medications   Allergies as of 04/18/2021   No Known Allergies      Medication List     STOP taking these medications    benzonatate 100 MG capsule Commonly known as: Tessalon Perles   hydrOXYzine 50 MG tablet Commonly known as: ATARAX/VISTARIL   loratadine 10 MG tablet Commonly known as: CLARITIN   metoprolol succinate 100 MG 24 hr tablet Commonly known as: TOPROL-XL   sucralfate 1 GM/10ML suspension Commonly known as: Carafate       TAKE these medications    acetaminophen 325 MG tablet Commonly known as: TYLENOL Take 2 tablets (650 mg total) by mouth every 4 (four) hours as needed for headache or mild pain.   allopurinol 100 MG tablet Commonly known as: ZYLOPRIM Take 1 tablet (100 mg total) by mouth daily. For gout   colchicine 0.6 MG tablet Take 1 tablet (0.6 mg total) by mouth daily as needed (gout flare).   dapagliflozin propanediol 10 MG Tabs tablet Commonly known as: FARXIGA Take 1 tablet (10 mg total) by mouth daily.   diltiazem 300 MG 24 hr capsule Commonly known as: CARDIZEM CD Take 1 capsule (300 mg total) by mouth daily. Start taking on: April 19, 2021   furosemide 40 MG tablet  Commonly known as: LASIX Take 1 tablet (40 mg total) by mouth daily. What changed: when to take this   pantoprazole 40 MG tablet Commonly known as: PROTONIX Take 1 tablet (40 mg total) by mouth daily.   traZODone 50 MG tablet Commonly known as: DESYREL Take 0.5-1 tablets (25-50 mg total) by mouth at bedtime as needed for sleep.   Xarelto 20 MG Tabs  tablet Generic drug: rivaroxaban Take 1 tablet (20 mg total) by mouth daily with supper.           Outstanding Labs/Studies    Duration of Discharge Encounter   Greater than 30 minutes including physician time.  Signed, Margie Billet, NP 04/18/2021, 11:24 AM

## 2021-04-18 NOTE — Anesthesia Preprocedure Evaluation (Signed)
Anesthesia Evaluation  Patient identified by MRN, date of birth, ID band Patient awake    Reviewed: Allergy & Precautions, NPO status , Patient's Chart, lab work & pertinent test results  Airway Mallampati: III  TM Distance: >3 FB Neck ROM: Full    Dental no notable dental hx.    Pulmonary Current Smoker, former smoker,    Pulmonary exam normal breath sounds clear to auscultation       Cardiovascular hypertension, Pt. on medications Normal cardiovascular exam+ dysrhythmias Atrial Fibrillation  Rhythm:Regular Rate:Normal     Neuro/Psych negative neurological ROS     GI/Hepatic Neg liver ROS, PUD,   Endo/Other  Morbid obesity  Renal/GU negative Renal ROS     Musculoskeletal   Abdominal (+) + obese,   Peds  Hematology negative hematology ROS (+)   Anesthesia Other Findings   Reproductive/Obstetrics                             Anesthesia Physical  Anesthesia Plan  ASA: 3  Anesthesia Plan: MAC   Post-op Pain Management:    Induction: Intravenous  PONV Risk Score and Plan: Treatment may vary due to age or medical condition  Airway Management Planned: Nasal Cannula  Additional Equipment:   Intra-op Plan:   Post-operative Plan:   Informed Consent: I have reviewed the patients History and Physical, chart, labs and discussed the procedure including the risks, benefits and alternatives for the proposed anesthesia with the patient or authorized representative who has indicated his/her understanding and acceptance.     Dental advisory given  Plan Discussed with: CRNA  Anesthesia Plan Comments:         Anesthesia Quick Evaluation

## 2021-04-18 NOTE — TOC Benefit Eligibility Note (Signed)
Patient Advocate Encounter  Insurance verification completed.    The patient is uninsured  Ohn Bostic, CPhT Pharmacy Patient Advocate Specialist [place] Antimicrobial Stewardship Team Direct Number: [phone]  Fax: [phone]        

## 2021-04-18 NOTE — Progress Notes (Signed)
Echocardiogram Echocardiogram Transesophageal has been performed.  Oneal Deputy Koleman Marling RDCS 04/18/2021, 9:09 AM

## 2021-04-18 NOTE — Progress Notes (Signed)
Patient resting on CPAP, via FFM previous settings (13.0) cm H20 with 3L O2 bleed in.Tolerating well at this time.

## 2021-04-18 NOTE — Transfer of Care (Signed)
Immediate Anesthesia Transfer of Care Note  Patient: Samuel Flynn [facility]  Procedure(s) Performed: TRANSESOPHAGEAL ECHOCARDIOGRAM (TEE) CARDIOVERSION  Patient Location: Endoscopy Unit  Anesthesia Type:MAC  Level of Consciousness: awake, alert  and oriented  Airway & Oxygen Therapy: Patient Spontanous Breathing and Patient connected to nasal cannula oxygen  Post-op Assessment: Report given to RN and Post -op Vital signs reviewed and stable  Post vital signs: Reviewed and stable  Last Vitals:  Vitals Value Taken Time  BP 114/72   Temp    Pulse 67 04/18/21 0904  Resp 24 04/18/21 0904  SpO2 100 % 04/18/21 0904  Vitals shown include unvalidated device data.  Last Pain:  Vitals:   04/18/21 0736  TempSrc: Temporal  PainSc: 8       Patients Stated Pain Goal: 2 (37/04/88 8916)  Complications: No notable events documented.

## 2021-04-18 NOTE — Progress Notes (Signed)
Pt provided 30 day coupon for Farxiga, pt is un-insured and will need to apply for pt. Assistance programs for both [place]- discussed this with pt and patient to f/u with MD on follow up visit.

## 2021-04-18 NOTE — Progress Notes (Signed)
Progress Note  Patient Name: Samuel Flynn Date of Encounter: 04/18/2021  [facility] Cardiologist: Skeet Latch, MD   Subjective   Patient is feeling overall improved, underwent TEE with DCCV today with conversion to SR. He is still having left great toe pain and right knee and leg pain. He states he was off allopurinol for at least few months before current admission because he had no refills. He felt colchicine is not taking care of the pain.    Inpatient Medications    Scheduled Meds:  allopurinol  100 mg Oral Daily   diltiazem  300 mg Oral Daily   furosemide  40 mg Intravenous Daily   pantoprazole  40 mg Oral Daily   rivaroxaban  20 mg Oral Q supper   sodium chloride flush  3 mL Intravenous Q12H   Continuous Infusions:  sodium chloride     PRN Meds: sodium chloride, acetaminophen, colchicine, HYDROcodone-acetaminophen, hydrOXYzine, ketorolac, ondansetron (ZOFRAN) IV, sodium chloride flush, traZODone   Vital Signs    Vitals:   04/18/21 0905 04/18/21 0915 04/18/21 0925 04/18/21 0944  BP: 127/84 134/77 135/90 127/74  Pulse:    61  Resp:    18  Temp: (!) 97.3 F (36.3 C)   98.4 F (36.9 C)  TempSrc: Tympanic   Oral  SpO2:  100% 100% 98%  Weight:      Height:        Intake/Output Summary (Last 24 hours) at 04/18/2021 1016 Last data filed at 04/18/2021 0857 Gross per 24 hour  Intake 818.82 ml  Output --  Net 818.82 ml   Last 3 Weights 04/18/2021 04/18/2021 04/17/2021  Weight (lbs) 315 lb 312 lb 1 oz 312 lb 1.6 oz  Weight (kg) 142.883 kg 141.55 kg 141.568 kg      Telemetry    Currently in SR with rate of 70s after DCCV, was in A fib /A flutter prior  - Personally Reviewed  ECG    Post DCCV EKG today showed sinus rhythm with rate of 63 bpm, TWI of inferior leads appears new and TWI of lateral leads are old - Personally Reviewed  Physical Exam   GEN: Well nourished, well developed in no acute distress HEENT: Head atraumatic  NECK: No  JVD CARDIAC: RRR, normal S1 and S2, no rubs or gallops. No murmur. RESPIRATORY:  Clear to auscultation without rales, wheezing or rhonchi , on room air, speaks full sentence  ABDOMEN: Soft, non-tender, non-distended MUSCULOSKELETAL:  No BLE edema   SKIN: Warm and dry NEUROLOGIC:  Alert and oriented x 3. No focal neuro deficits noted. PSYCHIATRIC:  Normal affect    Labs    High Sensitivity Troponin:   Recent Labs  Lab 04/15/21 1530 04/15/21 1730  TROPONINIHS 35* 112*      Chemistry Recent Labs  Lab 04/15/21 1730 04/16/21 0413 04/17/21 0320 04/18/21 0235  NA  --  137 137 138  K  --  3.9 3.3* 4.1  CL  --  103 103 102  CO2  --  '23 25 25  '$ GLUCOSE  --  137* 157* 143*  BUN  --  '15 16 14  '$ CREATININE  --  1.16 1.24 1.30*  CALCIUM  --  8.4* 8.3* 8.5*  PROT 6.5  --   --   --   ALBUMIN 3.5  --   --   --   AST 22  --   --   --   ALT 22  --   --   --  ALKPHOS 76  --   --   --   BILITOT 1.0  --   --   --   GFRNONAA  --  >60 >60 >60  ANIONGAP  --  '11 9 11     '$ Hematology Recent Labs  Lab 04/16/21 0413 04/17/21 0320 04/18/21 0235  WBC 7.7 7.0 7.9  RBC 5.51 5.18 5.00  HGB 15.4 14.5 14.1  HCT 49.0 43.2 41.6  MCV 88.9 83.4 83.2  MCH 27.9 28.0 28.2  MCHC 31.4 33.6 33.9  RDW 19.1* 18.4* 18.0*  PLT 234 190 213    BNPNo results for input(s): BNP, PROBNP in the last 168 hours.   DDimer No results for input(s): DDIMER in the last 168 hours.   Radiology    No results found.  Cardiac Studies   TEE from 04/18/21:  Left Ventrical:  normal LV function .   Mitral Valve: mild MR    Aortic Valve: normal 3 leaflet valve, no AS o rAI    Tricuspid Valve: normal valve,    Pulmonic Valve: normal    Left Atrium/ Left atrial appendage: no thrombi,    Atrial septum: no obvious asd or PFO with color doppler    Aorta: normal.   Complications: No apparent complications Patient did tolerate procedure well.   DCCV on 04/18/21:  Procedure Details Consent: Obtained Time  Out: Verified patient identification, verified procedure, site/side was marked, verified correct patient position, special equipment/implants available, Radiology Safety Procedures followed,  medications/allergies/relevent history reviewed, required imaging and test results available.  Performed   The patient has been on adequate anticoagulation.  The patient received Lidocaine and propofol ( see above)  for sedation.  Synchronous cardioversion was performed at 200  joules.   The cardioversion was successful.    Complications: No apparent complications Patient did tolerate procedure well.  Echo 04/16/21   1. Left ventricular ejection fraction, by estimation, is 55 to 60%. The  left ventricle has normal function. The left ventricle has no regional  wall motion abnormalities. The left ventricular internal cavity size was  mildly dilated. There is mild left ventricular hypertrophy. Left ventricular diastolic parameters are  indeterminate.   2. Right ventricular systolic function is normal. The right ventricular  size is normal.   3. The mitral valve is normal in structure. No evidence of mitral valve  regurgitation. No evidence of mitral stenosis.   4. The aortic valve is tricuspid. Aortic valve regurgitation is not  visualized. No aortic stenosis is present.   5. The inferior vena cava is normal in size with greater than 50%  respiratory variability, suggesting right atrial pressure of 3 mmHg.   Patient Profile     45 y.o. male with PMH of HTN, obesity, OSA on CPAP, CKD II-III, persistent atrial fibrillation s/p AF ablation 04/26/2018 with repeat ablation 11/07/2019. He is currently admitted since 04/15/21 for atrial flutter.   Assessment & Plan    Atrial flutter Hx of persistent atrial fibrillation  - presented with unwell feeling for 1 month, leg swelling, abd swelling, DOE, SOB, weight gain, PND, orthopnea, heart palpitation  - EKG at admission showed A flutter 2:1 with ventricular rate  of 140s  - Echo showed EF 55-60%, no RWMA, mild LVH, no valvular disease  - CHA2DS2/VAS 2, has been off of anticoagulation since 03/22/20 after EP office visit; started on heparin gtt this admission, transitioned to rivaroxaban 04/16/21  - rate controlled on diltiazem drip initially, s/p TEE with DCCV today, normal LV function,  mild MR, no LA/LAA thrombus, no ASD or PFO; successful conversion to SR, stated on PO Cardizem '300mg'$  daily after procedure, home metoprolol XL has been held, will defer to MD on final regimen   Acute diastolic heart failure - Echo as above, preserved EF - continued on furosemide 40 mg IV daily, was on PO lasix '40mg'$  daily at home without improvement  - I&O not accurately documented, weight is 320 >315 ib since admission  - clinically euvolemic today, may transition to PO lasix  - plan to start SGLT2i prior to discharge  Elevated troponin -suspect demand in the setting of RVR and heart failure -hsTn 35 > 112 -prior imaging with no coronary calcium calcium, no known CAD  HTN - BP overall controlled, home meds metoprolol XL '100mg'$  daily has been held in the setting of cardizem gtt use for RVR, may transition post DCCV today   Type II diabetes, new /presumed  -A1c 6.9 this admission  -plan to start metformin at discharge,  follow up with PCP   Acute pain of left great toe and right knee/leg Gouty arthritis  - suppose to take allopurinol '100mg'$  daily at home but no refills and had been off for months  - s/p colchicine load without adequate relief (probably too late in the course), currently on PRN hydrocodone, attempt to avoid steroid due to arrhythmia per MD  - will add PRN Toradol today for symptoms relief   OSA - CPAP continued at night     For questions or updates, please contact [place] [facility] Please consult [email or web address] for contact info under        Signed, Margie Billet, NP  04/18/2021, 10:16 AM

## 2021-04-18 NOTE — Anesthesia Postprocedure Evaluation (Signed)
Anesthesia Post Note  Patient: Marquinn Meschke [facility]  Procedure(s) Performed: TRANSESOPHAGEAL ECHOCARDIOGRAM (TEE) CARDIOVERSION     Patient location during evaluation: Endoscopy Anesthesia Type: MAC Level of consciousness: awake and alert Pain management: pain level controlled Vital Signs Assessment: post-procedure vital signs reviewed and stable Respiratory status: spontaneous breathing, nonlabored ventilation and respiratory function stable Cardiovascular status: blood pressure returned to baseline and stable Postop Assessment: no apparent nausea or vomiting Anesthetic complications: no   No notable events documented.  Last Vitals:  Vitals:   04/18/21 0915 04/18/21 0925  BP: 134/77 135/90  Pulse:    Resp:    Temp:    SpO2: 100% 100%    Last Pain:  Vitals:   04/18/21 0925  TempSrc:   PainSc: 0-No pain                 Lynda Rainwater

## 2021-04-19 ENCOUNTER — Other Ambulatory Visit: Payer: Self-pay

## 2021-04-19 ENCOUNTER — Telehealth: Payer: Self-pay

## 2021-04-19 ENCOUNTER — Encounter (HOSPITAL_COMMUNITY): Payer: Self-pay | Admitting: Cardiovascular Disease

## 2021-04-19 NOTE — Telephone Encounter (Signed)
**Note De-Identified Jerry Haugen Obfuscation** We received a Xarelto PA request from [employer]. I called [employer] and attempted to do this PA over the phone with [place] but was advised that Xarelto is covered under the pts plan.  I have closed this request out in covermymeds so they will not send anymore request for Xarelto for this pt.

## 2021-04-19 NOTE — Telephone Encounter (Signed)
Transition Care Management Follow-up Telephone Call Date of discharge and from where: 04/18/2021, [facility]  How have you been since you were released from the hospital? The patient said that his gout is hurting and then handed the phone to his wife , Samuel Flynn, to complete the call.  Any questions or concerns? Yes- he does not have all of his medications.  Samuel Flynn  said that he only has furosemide and xarelto. She said that his [facility] called and said the medications that were ready for pick up are $1500 and they are not able to afford that. Explained to her that [facility] has a one time free-fill for patients without insurance and he may also qualify for patient assistance programs to help with medication cost.  She was in agreement to having this CM ask  [facility] call her about the programs available to her husband.  Samuel Flynn can be reached at # [phone]. She was also in agreement to having prescriptions transferred from [facility] to [place].  The request for transferring prescriptions was shared with Samuel Flynn, [facility] Tech.  Provided Samuel Flynn with the phone number for [facility] if she has any questions.    Items Reviewed: Did the pt receive and understand the discharge instructions provided? Yes  Medications obtained and verified?  He does not have all medications - information noted above.  Other?  no Any new allergies since your discharge? No  Dietary orders reviewed? No Do you have support at home? Yes   Home Care and Equipment/Supplies: Were home health services ordered? no If so, what is the name of the agency? N/a  Has the agency set up a time to come to the patient's home? not applicable Were any new equipment or medical supplies ordered?  No What is the name of the medical supply agency? N/a Were you able to get the supplies/equipment? not applicable Do you have any questions related to the use of the equipment or supplies? No  Functional Questionnaire:  (I = Independent and D = Dependent) ADLs: independent  Follow up appointments reviewed:  PCP Hospital f/u appt confirmed? Yes  Scheduled to see Samuel Fruits, NP on 04/29/2021  @ 1050. [facility] f/u appt confirmed? Yes  Scheduled to see cardiology on 05/05/2021.  Are transportation arrangements needed? No  If their condition worsens, is the pt aware to call PCP or go to the Emergency Dept.? Yes Was the patient provided with contact information for the PCP's office or ED? Yes Was to pt encouraged to call back with questions or concerns? Yes

## 2021-04-20 ENCOUNTER — Other Ambulatory Visit: Payer: Self-pay

## 2021-04-20 ENCOUNTER — Telehealth: Payer: Self-pay

## 2021-04-20 NOTE — Telephone Encounter (Signed)
Call placed to patient's wife, Samuel Flynn.  She confirmed that she spoke to Cheryle Horsfall, [place] about the medications at [facility] and paperwork that needs to be completed for patient assistance.  Samuel Flynn was very appreciative of the assistance.

## 2021-04-28 NOTE — Progress Notes (Signed)
Patient did not show for appointment.   

## 2021-04-29 ENCOUNTER — Encounter: Payer: Medicaid Other | Admitting: Family

## 2021-04-29 DIAGNOSIS — Z8679 Personal history of other diseases of the circulatory system: Secondary | ICD-10-CM

## 2021-04-29 DIAGNOSIS — I4892 Unspecified atrial flutter: Secondary | ICD-10-CM

## 2021-04-29 DIAGNOSIS — Z09 Encounter for follow-up examination after completed treatment for conditions other than malignant neoplasm: Secondary | ICD-10-CM

## 2021-04-29 DIAGNOSIS — E119 Type 2 diabetes mellitus without complications: Secondary | ICD-10-CM

## 2021-04-29 DIAGNOSIS — G4733 Obstructive sleep apnea (adult) (pediatric): Secondary | ICD-10-CM

## 2021-04-29 DIAGNOSIS — R778 Other specified abnormalities of plasma proteins: Secondary | ICD-10-CM

## 2021-04-29 DIAGNOSIS — I5031 Acute diastolic (congestive) heart failure: Secondary | ICD-10-CM

## 2021-04-29 DIAGNOSIS — M109 Gout, unspecified: Secondary | ICD-10-CM

## 2021-04-29 DIAGNOSIS — I10 Essential (primary) hypertension: Secondary | ICD-10-CM

## 2021-05-04 NOTE — Progress Notes (Signed)
Office Visit    Patient Name: Samuel Flynn Date of Encounter: 05/04/2021  PCP:  Nicolette Bang, MD   [place]  Cardiologist:  Skeet Latch, MD  Advanced Practice Provider:  No care team member to display Electrophysiologist:  Will Meredith Leeds, MD      Chief Complaint    Samuel Flynn is a 45 y.o. male with a hx of atrial flutter, atrial fibrillation s/p ablation, OSA, HFpEF, DM2, HTN, gout presents today for hospital follow up.   Past Medical History    Past Medical History:  Diagnosis Date   Atrial fibrillation with RVR ([place])    Gastric ulcer    Gout    Hypertension    Paroxysmal A-fib (HCC)    Sleep apnea    USES CPAP   Wears glasses    Past Surgical History:  Procedure Laterality Date   ATRIAL FIBRILLATION ABLATION N/A 04/26/2018   Procedure: ATRIAL FIBRILLATION ABLATION;  Surgeon: Constance Haw, MD;  Location: [place] CV LAB;  Service: Cardiovascular;  Laterality: N/A;   ATRIAL FIBRILLATION ABLATION N/A 11/07/2019   Procedure: ATRIAL FIBRILLATION ABLATION;  Surgeon: Constance Haw, MD;  Location: [place] CV LAB;  Service: Cardiovascular;  Laterality: N/A;   CARDIOVERSION N/A 01/18/2018   Procedure: CARDIOVERSION;  Surgeon: Larey Dresser, MD;  Location: [facility] ENDOSCOPY;  Service: Cardiovascular;  Laterality: N/A;   CARDIOVERSION N/A 08/19/2019   Procedure: CARDIOVERSION;  Surgeon: Thayer Headings, MD;  Location: [facility] ENDOSCOPY;  Service: Cardiovascular;  Laterality: N/A;   CARDIOVERSION N/A 04/18/2021   Procedure: CARDIOVERSION;  Surgeon: Acie Fredrickson Wonda Cheng, MD;  Location: [facility] ENDOSCOPY;  Service: Cardiovascular;  Laterality: N/A;   ELBOW LIGAMENT RECONSTRUCTION Right 09/29/2014   Procedure: Merian Capron FRACTURE FIXATION, POSSIBLE LIGAMENT REPAIR;  Surgeon: Nita Sells, MD;  Location: [place];  Service: Orthopedics;  Laterality: Right;  Right elbow coranoid fracture fixation,  possible ligament repair   TEE WITHOUT CARDIOVERSION N/A 01/18/2018   Procedure: TRANSESOPHAGEAL ECHOCARDIOGRAM (TEE);  Surgeon: Larey Dresser, MD;  Location: [facility] ENDOSCOPY;  Service: Cardiovascular;  Laterality: N/A;   TEE WITHOUT CARDIOVERSION N/A 04/18/2021   Procedure: TRANSESOPHAGEAL ECHOCARDIOGRAM (TEE);  Surgeon: Thayer Headings, MD;  Location: [facility] ENDOSCOPY;  Service: Cardiovascular;  Laterality: N/A;   WISDOM TOOTH EXTRACTION      Allergies  No Known Allergies  History of Present Illness    Samuel Flynn is a 45 y.o. male with a hx of atrial flutter, atrial fibrillation s/p ablation, OSA, HFpEF, DM2, HTN, gout last seen while hospitalized.  He had atrial fibrillation ablation in 2021 with Dr. Curt Bears. His anticoagulation was subsequently discontinued.   Admitted 04/15/21 after not feeling well for one month with edema, DOE, weight gain, PND, orthopnea, palpitations. He was in atrial flutter. Echo LVEF 55-60%, no rrWMA, mild LVH, no valvular disease. He was started on Diltiazem and Xarelto. He underwent cardioversion during admission.   Presents today for follow up with his wife. Notes intermittent palpitations since discharge. Initial HR by vitals 120bpm though subsequent EKG with HR 59bpm. Notes dyspnea on exertion. No edema, orthopnea, PND. No chest pain. Overall still very fatigued.   EKGs/Labs/Other Studies Reviewed:   The following studies were reviewed today:  TEE from 04/18/21:   Left Ventrical:  normal LV function .   Mitral Valve: mild MR    Aortic Valve: normal 3 leaflet valve, no AS o rAI    Tricuspid Valve: normal valve,  Pulmonic Valve: normal    Left Atrium/ Left atrial appendage: no thrombi,    Atrial septum: no obvious asd or PFO with color doppler    Aorta: normal.   Complications: No apparent complications Patient did tolerate procedure well.     DCCV on 04/18/21:   Procedure Details Consent: Obtained Time Out: Verified patient  identification, verified procedure, site/side was marked, verified correct patient position, special equipment/implants available, Radiology Safety Procedures followed,  medications/allergies/relevent history reviewed, required imaging and test results available.  Performed   The patient has been on adequate anticoagulation.  The patient received Lidocaine and propofol ( see above)  for sedation.  Synchronous cardioversion was performed at 200  joules.   The cardioversion was successful.    Complications: No apparent complications Patient did tolerate procedure well.   Echo 04/16/21    1. Left ventricular ejection fraction, by estimation, is 55 to 60%. The  left ventricle has normal function. The left ventricle has no regional  wall motion abnormalities. The left ventricular internal cavity size was  mildly dilated. There is mild left ventricular hypertrophy. Left ventricular diastolic parameters are  indeterminate.   2. Right ventricular systolic function is normal. The right ventricular  size is normal.   3. The mitral valve is normal in structure. No evidence of mitral valve  regurgitation. No evidence of mitral stenosis.   4. The aortic valve is tricuspid. Aortic valve regurgitation is not  visualized. No aortic stenosis is present.   5. The inferior vena cava is normal in size with greater than 50%  respiratory variability, suggesting right atrial pressure of 3 mmHg.    _____________  EKG:  EKG is ordered today.  The ekg ordered today demonstrates SB 59 bpm with stable anterolateral TWI.   Recent Labs: 04/15/2021: ALT 22; Magnesium 2.2 04/16/2021: TSH 2.620 04/18/2021: BUN 14; Creatinine, Ser 1.30; Hemoglobin 14.1; Platelets 213; Potassium 4.1; Sodium 138  Recent Lipid Panel    Component Value Date/Time   CHOL 203 (H) 04/16/2021 0413   TRIG 428 (H) 04/16/2021 0413   HDL 24 (L) 04/16/2021 0413   CHOLHDL 8.5 04/16/2021 0413   VLDL UNABLE TO CALCULATE IF TRIGLYCERIDE OVER 400  mg/dL 04/16/2021 0413   LDLCALC UNABLE TO CALCULATE IF TRIGLYCERIDE OVER 400 mg/dL 04/16/2021 0413   LDLDIRECT 107.0 (H) 04/16/2021 0413      Home Medications   No outpatient medications have been marked as taking for the 05/05/21 encounter (Appointment) with Loel Dubonnet, NP.     Review of Systems      All other systems reviewed and are otherwise negative except as noted above.  Physical Exam    VS:  There were no vitals taken for this visit. , BMI There is no height or weight on file to calculate BMI.  Wt Readings from Last 3 Encounters:  04/18/21 (!) 315 lb (142.9 kg)  08/25/20 (!) 313 lb 3.2 oz (142.1 kg)  03/22/20 (!) 321 lb (145.6 kg)     GEN: Well nourished, well developed, in no acute distress. HEENT: normal. Neck: Supple, no JVD, carotid bruits, or masses. Cardiac: RRR, no murmurs, rubs, or gallops. No clubbing, cyanosis, edema.  Radials/PT 2+ and equal bilaterally.  Respiratory:  Respirations regular and unlabored, clear to auscultation bilaterally. GI: Soft, nontender, nondistended. MS: No deformity or atrophy. Skin: Warm and dry, no rash. Neuro:  Strength and sensation are intact. Psych: Normal affect.  Assessment & Plan    Atrial flutter / Atrial fibrillation - Initial  vitals with HR 120bpm though subsequent EKG 59bpm. Reports intermittent palpitations. Continue Diltiazem '300mg'$  QD. Unable to further uptitrate due to bradycardia. BMP, CBC ordered. Continue Xarelto '20mg'$  QD due to CHads2vasc of at least 3 (DM2, HTN, HF). Denies bleeding complications. Given intermittent palpitations, 14 day ZIO placed. Recommend follow up with EP Dr. Curt Bears. If still with atrial fib/flutter will need to consider AAD.   HTN - BP not at goal though volume up and stressed regarding intermittent palpitations. Home monitoring encouraged. Future considerations include addition of ARB vs Spironolactone.  Diastolic heart failure - Weight up 2 lbs since discharge. Take Lasix '80mg'$   tomorrow and '40mg'$  thereafter with additional '40mg'$  PRn for weight gain 2 lbs overnight or 5 lbs in 1 week. Low salt diet and <2L fluid restriction encouraged. GDMT includes Lasix, Farxiga.  DM2 - New diagnosis during admit. 04/15/21 A1c 6.9. Wilder Glade started during recent admission for additional HF benefit. Continue to follow with PCP.   OSA - CPAP compliance encouraged.   Gout - Taking Allopurinol daily. Reports continued gout. Recommend addition of Colchicine 0.'6mg'$  QD for 2 weeks. Uric acid level ordered. May use OTC Tylenol. If no relief, discuss with PCP. Try to avoid prednisone given recent arrhythmia.   Disposition: Follow up in 6-8 week(s) with Dr. Oval Linsey or APP.  Signed, Loel Dubonnet, NP 05/04/2021, 9:09 PM [place]

## 2021-05-05 ENCOUNTER — Other Ambulatory Visit: Payer: Self-pay

## 2021-05-05 ENCOUNTER — Ambulatory Visit (INDEPENDENT_AMBULATORY_CARE_PROVIDER_SITE_OTHER): Payer: Medicaid Other | Admitting: Family

## 2021-05-05 ENCOUNTER — Ambulatory Visit (HOSPITAL_BASED_OUTPATIENT_CLINIC_OR_DEPARTMENT_OTHER): Payer: Medicaid Other

## 2021-05-05 ENCOUNTER — Encounter (HOSPITAL_BASED_OUTPATIENT_CLINIC_OR_DEPARTMENT_OTHER): Payer: Self-pay | Admitting: Family

## 2021-05-05 VITALS — BP 140/78 | HR 120 | Ht 74.0 in | Wt 317.0 lb

## 2021-05-05 DIAGNOSIS — I4819 Other persistent atrial fibrillation: Secondary | ICD-10-CM | POA: Diagnosis not present

## 2021-05-05 DIAGNOSIS — I5032 Chronic diastolic (congestive) heart failure: Secondary | ICD-10-CM

## 2021-05-05 DIAGNOSIS — M1000 Idiopathic gout, unspecified site: Secondary | ICD-10-CM

## 2021-05-05 DIAGNOSIS — I4892 Unspecified atrial flutter: Secondary | ICD-10-CM | POA: Diagnosis not present

## 2021-05-05 DIAGNOSIS — E1165 Type 2 diabetes mellitus with hyperglycemia: Secondary | ICD-10-CM | POA: Diagnosis not present

## 2021-05-05 DIAGNOSIS — Z7901 Long term (current) use of anticoagulants: Secondary | ICD-10-CM | POA: Diagnosis not present

## 2021-05-05 MED ORDER — ALLOPURINOL 100 MG PO TABS: 100.0000 mg | ORAL_TABLET | Freq: Every day | ORAL | 0 refills | Status: DC

## 2021-05-05 MED ORDER — COLCHICINE 0.6 MG PO TABS: 0.6000 mg | ORAL_TABLET | Freq: Every day | ORAL | 1 refills | Status: DC | PRN

## 2021-05-05 NOTE — Patient Instructions (Addendum)
Medication Instructions:  Your physician has recommended you make the following change in your medication:   CHANGE Furosemide (Lasix) to '40mg'$  daily with additional tablet as needed for weight gain of 2 lbs overnight or 5 lbs in 1 week TOMORROW (05/06/21) take '80mg'$ , thereafter take '40mg'$  with '80mg'$  dose only as needed If you are requiring additional Lasix more than twice per week, contact our office.  START Colchicine one tablet (0.'6mg'$ ) daily. If your gout does not improve over the next 1-2 weeks recommend contacting your primary care office.   *If you need a refill on your cardiac medications before your next appointment, please call your pharmacy*  Lab Work: Your physician recommends that you return for lab work today or tomorrow at [facility] for BMP, CBC, uric acid  If you have labs (blood work) drawn today and your tests are completely normal, you will receive your results only by: [facility] (if you have MyChart) OR A paper copy in the mail If you have any lab test that is abnormal or we need to change your treatment, we will call you to review the results.   Testing/Procedures: Your EKG today showed sinus bradycardia which is a stable heart rate.   Your physician has recommended that you wear a Zio monitor.   This monitor is a medical device that records the heart's electrical activity. Doctors most often use these monitors to diagnose arrhythmias. Arrhythmias are problems with the speed or rhythm of the heartbeat. The monitor is a small device applied to your chest. You can wear one while you do your normal daily activities. While wearing this monitor if you have any symptoms to push the button and record what you felt. Once you have worn this monitor for the period of time provider prescribed (Usually 14 days), you will return the monitor device in the postage paid box. Once it is returned they will download the data collected and provide [place] with a report which the provider will then  review and we will call you with those results. Important tips:  Avoid showering during the first 24 hours of wearing the monitor. Avoid excessive sweating to help maximize wear time. Do not submerge the device, no hot tubs, and no swimming pools. Keep any lotions or oils away from the patch. After 24 hours you may shower with the patch on. Take brief showers with your back facing the shower head.  Do not remove patch once it has been placed because that will interrupt data and decrease adhesive wear time. Push the button when you have any symptoms and write down what you were feeling. Once you have completed wearing your monitor, remove and place into box which has postage paid and place in your outgoing mailbox.  If for some reason you have misplaced your box then call our office and we can provide another box and/or mail it off for you.   Follow-Up: At [facility], you and your health needs are our priority.  As part of our continuing mission to provide you with exceptional heart care, we have created designated Provider Care Teams.  These Care Teams include your primary Cardiologist (physician) and Advanced Practice Providers (APPs -  Physician Assistants and Nurse Practitioners) who all work together to provide you with the care you need, when you need it.  We recommend signing up for the patient portal called "MyChart".  Sign up information is provided on this After Visit Summary.  MyChart is used to connect with patients for Virtual  Visits (Telemedicine).  Patients are able to view lab/test results, encounter notes, upcoming appointments, etc.  Non-urgent messages can be sent to your provider as well.   To learn more about what you can do with MyChart, go to [email or web address].    Your next appointment:   With Dr. Curt Bears after monitor Loel Dubonnet, NP will reach out to his scheduling team) AND  In 6-8 weeks with Dr. Oval Linsey or APP   Other Instructions  Heart Healthy  Diet Recommendations: A low-salt diet is recommended. Meats should be grilled, baked, or boiled. Avoid fried foods. Focus on lean protein sources like fish or chicken with vegetables and fruits. The American Heart Association is a Microbiologist!    To prevent or reduce lower extremity swelling: Eat a low salt diet. Salt makes the body hold onto extra fluid which causes swelling. Sit with legs elevated. For example, in the recliner or on an [place].  Wear knee-high compression stockings during the daytime. Ones labeled 15-20 mmHg provide good compression.   Exercise recommendations: The American Heart Association recommends 150 minutes of moderate intensity exercise weekly. Try 30 minutes of moderate intensity exercise 4-5 times per week. This could include walking, jogging, or swimming.   Recommend establishing with a primary care provider.  You may call [facility] @ [phone] for a list of primary care providers in your area or visit their website [email or web address] Please have any insurance card available before calling or going online.

## 2021-05-16 ENCOUNTER — Other Ambulatory Visit: Payer: Self-pay

## 2021-05-18 ENCOUNTER — Other Ambulatory Visit: Payer: Self-pay

## 2021-05-19 ENCOUNTER — Other Ambulatory Visit: Payer: Self-pay

## 2021-06-15 ENCOUNTER — Encounter (HOSPITAL_BASED_OUTPATIENT_CLINIC_OR_DEPARTMENT_OTHER): Payer: Self-pay

## 2021-06-15 NOTE — Telephone Encounter (Signed)
Thanks for the update!  Schylar Wuebker S Jasimine Simms, NP  

## 2021-06-18 ENCOUNTER — Other Ambulatory Visit (HOSPITAL_BASED_OUTPATIENT_CLINIC_OR_DEPARTMENT_OTHER): Payer: Self-pay | Admitting: Family

## 2021-06-18 ENCOUNTER — Other Ambulatory Visit: Payer: Self-pay | Admitting: Home Health

## 2021-06-18 DIAGNOSIS — M1000 Idiopathic gout, unspecified site: Secondary | ICD-10-CM

## 2021-06-20 ENCOUNTER — Other Ambulatory Visit: Payer: Self-pay

## 2021-06-20 MED ORDER — DAPAGLIFLOZIN PROPANEDIOL 10 MG PO TABS
10.0000 mg | ORAL_TABLET | Freq: Every day | ORAL | 2 refills | Status: DC
  Filled 2021-06-20: qty 30, 30d supply, fill #0
  Filled 2021-07-23 – 2021-08-28 (×3): qty 30, 30d supply, fill #1

## 2021-06-21 NOTE — Telephone Encounter (Signed)
Spoke to pt. He stated he and his wife are having problems and he is not sure what his wife has done with the monitor, but he will check on it. I reminded him to make sure the monitor and log book are in the box and box is sealed, then place in mailbox to go back to ZIO so we can get his results. Pt verbalized understanding. He stated he has been having chest pain, sob, and feels his "heart is out again." He stated he is still having dizziness, states "this is an everyday thing." He stated he did not want to go to the ED and is hoping he can get a sooner appointment with Urban Gibson, NP. He stated he does not want to wait until next week if possible. He is currently scheduled on 07/01/21 with Urban Gibson, NP.  Routing to [place], NP to advise. I informed him that right now, the schedule is booked, but that I would send a message to see if we can get him appointment sooner.

## 2021-06-21 NOTE — Telephone Encounter (Signed)
Spoke with pt. He stated he will come in tomorrow at 8:30 AM. Informed him to go to the ED if symptoms worsen before appointment tomorrow. Pt verbalized thanks and understanding.

## 2021-06-21 NOTE — Telephone Encounter (Signed)
Okay to add onto my schedule tomorrow (06/22/21) at 8:30 AM or 1:15PM with EKG.   Recommend checking BP and HR at home.  Recommend adequate hydration, slow position changes.   Also has f/u with Dr. Curt Bears 06/27/21.   Loel Dubonnet, NP

## 2021-06-22 ENCOUNTER — Encounter (HOSPITAL_BASED_OUTPATIENT_CLINIC_OR_DEPARTMENT_OTHER): Payer: Self-pay | Admitting: Emergency Medicine

## 2021-06-22 ENCOUNTER — Emergency Department (HOSPITAL_BASED_OUTPATIENT_CLINIC_OR_DEPARTMENT_OTHER): Payer: Medicaid Other | Admitting: Radiology

## 2021-06-22 ENCOUNTER — Emergency Department (HOSPITAL_BASED_OUTPATIENT_CLINIC_OR_DEPARTMENT_OTHER)
Admission: EM | Admit: 2021-06-22 | Discharge: 2021-06-22 | Disposition: A | Discharge status: Home / Self Care | Payer: Medicaid Other | Attending: Emergency Medicine | Admitting: Emergency Medicine

## 2021-06-22 ENCOUNTER — Other Ambulatory Visit: Payer: Self-pay

## 2021-06-22 ENCOUNTER — Encounter (HOSPITAL_BASED_OUTPATIENT_CLINIC_OR_DEPARTMENT_OTHER): Payer: Self-pay | Admitting: Family

## 2021-06-22 ENCOUNTER — Ambulatory Visit (INDEPENDENT_AMBULATORY_CARE_PROVIDER_SITE_OTHER): Payer: Medicaid Other | Admitting: Family

## 2021-06-22 VITALS — BP 130/86 | HR 151 | Ht 74.0 in | Wt 303.8 lb

## 2021-06-22 DIAGNOSIS — I5032 Chronic diastolic (congestive) heart failure: Secondary | ICD-10-CM

## 2021-06-22 DIAGNOSIS — E1165 Type 2 diabetes mellitus with hyperglycemia: Secondary | ICD-10-CM | POA: Diagnosis not present

## 2021-06-22 DIAGNOSIS — I4892 Unspecified atrial flutter: Secondary | ICD-10-CM | POA: Insufficient documentation

## 2021-06-22 DIAGNOSIS — I5033 Acute on chronic diastolic (congestive) heart failure: Secondary | ICD-10-CM | POA: Diagnosis not present

## 2021-06-22 DIAGNOSIS — R000 Tachycardia, unspecified: Secondary | ICD-10-CM

## 2021-06-22 DIAGNOSIS — I110 Hypertensive heart disease with heart failure: Secondary | ICD-10-CM | POA: Insufficient documentation

## 2021-06-22 DIAGNOSIS — Z79899 Other long term (current) drug therapy: Secondary | ICD-10-CM | POA: Insufficient documentation

## 2021-06-22 DIAGNOSIS — D6859 Other primary thrombophilia: Secondary | ICD-10-CM

## 2021-06-22 DIAGNOSIS — I4891 Unspecified atrial fibrillation: Secondary | ICD-10-CM | POA: Diagnosis not present

## 2021-06-22 DIAGNOSIS — Z7901 Long term (current) use of anticoagulants: Secondary | ICD-10-CM | POA: Diagnosis not present

## 2021-06-22 DIAGNOSIS — F17290 Nicotine dependence, other tobacco product, uncomplicated: Secondary | ICD-10-CM | POA: Insufficient documentation

## 2021-06-22 LAB — CBC
HCT: 48.3 % (ref 39.0–52.0)
Hemoglobin: 16 g/dL (ref 13.0–17.0)
MCH: 26.9 pg (ref 26.0–34.0)
MCHC: 33.1 g/dL (ref 30.0–36.0)
MCV: 81.2 fL (ref 80.0–100.0)
Platelets: 312 10*3/uL (ref 150–400)
RBC: 5.95 MIL/uL — ABNORMAL HIGH (ref 4.22–5.81)
RDW: 19.1 % — ABNORMAL HIGH (ref 11.5–15.5)
WBC: 7.5 10*3/uL (ref 4.0–10.5)
nRBC: 0 % (ref 0.0–0.2)

## 2021-06-22 LAB — BASIC METABOLIC PANEL
Anion gap: 15 (ref 5–15)
BUN: 14 mg/dL (ref 6–20)
CO2: 26 mmol/L (ref 22–32)
Calcium: 9 mg/dL (ref 8.9–10.3)
Chloride: 97 mmol/L — ABNORMAL LOW (ref 98–111)
Creatinine, Ser: 1.19 mg/dL (ref 0.61–1.24)
GFR, Estimated: >60 mL/min (ref >60)
Glucose, Bld: 143 mg/dL — ABNORMAL HIGH (ref 70–99)
Potassium: 3.7 mmol/L (ref 3.5–5.1)
Sodium: 138 mmol/L (ref 135–145)

## 2021-06-22 LAB — MAGNESIUM: Magnesium: 1.8 mg/dL (ref 1.7–2.4)

## 2021-06-22 LAB — TROPONIN I (HIGH SENSITIVITY): Troponin I (High Sensitivity): 7 ng/L (ref <18)

## 2021-06-22 MED ORDER — ETOMIDATE 2 MG/ML IV SOLN
INTRAVENOUS | Status: AC | PRN
  Administered 2021-06-22: 13.8 mg via INTRAVENOUS

## 2021-06-22 MED ORDER — ETOMIDATE 2 MG/ML IV SOLN
0.1000 mg/kg | Freq: Once | INTRAVENOUS | Status: AC
  Administered 2021-06-22: 13.78 mg via INTRAVENOUS
  Filled 2021-06-22: qty 10

## 2021-06-22 MED ORDER — OXYCODONE HCL 5 MG PO TABS
5.0000 mg | ORAL_TABLET | Freq: Once | ORAL | Status: AC
  Administered 2021-06-22: 5 mg via ORAL
  Filled 2021-06-22: qty 1

## 2021-06-22 MED ORDER — MAGNESIUM OXIDE -MG SUPPLEMENT 400 (240 MG) MG PO TABS
800.0000 mg | ORAL_TABLET | Freq: Once | ORAL | Status: AC
  Administered 2021-06-22: 800 mg via ORAL
  Filled 2021-06-22: qty 2

## 2021-06-22 MED ORDER — POTASSIUM CHLORIDE CRYS ER 20 MEQ PO TBCR
40.0000 meq | EXTENDED_RELEASE_TABLET | Freq: Once | ORAL | Status: AC
  Administered 2021-06-22: 40 meq via ORAL
  Filled 2021-06-22: qty 2

## 2021-06-22 NOTE — ED Notes (Addendum)
At time of post procedure, pt immediately yells out with pain level "50". He is able to respond and answer questions. [place] score at this time 8.

## 2021-06-22 NOTE — ED Provider Notes (Signed)
[facility] EMERGENCY DEPT Provider Note   CSN: [id number] Arrival date & time: 06/22/21  9024     History Chief Complaint  Patient presents with   Tachycardia    Samuel Flynn is a 45 y.o. male.  HPI Patient presents for tachycardia.  He was at a cardiology visit upstairs and was found to be in atrial flutter with a rate of 150.  Per chart review, history includes A. fib s/p ablation, OSA, HFpEF, DM2, HTN.  He last underwent DCCV 2 months ago.  Currently, he takes Cardizem for rate control as well as Xarelto for elevated CHA2DS2-VASc score.  He states that he has not missed any doses of Xarelto.  He was at the cardiology office today for palpitations.  He endorses palpitations over the past week.  He also endorses associated chest pain and dyspnea.  He has not eaten in the past day due to his symptoms.  He denies any fevers or chills.    Past Medical History:  Diagnosis Date   Atrial fibrillation with RVR ([place])    Gastric ulcer    Gout    Hypertension    Paroxysmal A-fib (HCC)    Sleep apnea    USES CPAP   Wears glasses     Patient Active Problem List   Diagnosis Date Noted   Heart failure, type unknown ([place]) 04/16/2021   Atrial fibrillation with rapid ventricular response ([place]) 04/15/2021   Persistent atrial fibrillation (HCC)    Acute on chronic diastolic heart failure ([place]) 07/15/2019   Atrial flutter ([place]) 07/13/2019   Normal coronary arteries 04/11/2019   OSA (obstructive sleep apnea) 08/13/2018   GAD (generalized anxiety disorder) 08/13/2018   Acute pulmonary edema ([place]) 08/13/2018   S/P ablation of atrial fibrillation 04/26/18 04/27/2018   Paroxysmal atrial fibrillation ([place]) 04/26/2018   Atrial fibrillation with RVR ([place]) 04/10/2016   Atrial flutter with rapid ventricular response ([place]) 10/18/2015   Essential hypertension, benign 02/19/2014   Gout 12/27/2006   Morbid obesity ([place]) 12/27/2006    Past Surgical History:  Procedure  Laterality Date   ATRIAL FIBRILLATION ABLATION N/A 04/26/2018   Procedure: ATRIAL FIBRILLATION ABLATION;  Surgeon: Constance Haw, MD;  Location: [facility] [place] [facility];  Service: Cardiovascular;  Laterality: N/A;   ATRIAL FIBRILLATION ABLATION N/A 11/07/2019   Procedure: ATRIAL FIBRILLATION ABLATION;  Surgeon: Constance Haw, MD;  Location: [place] [facility] LAB;  Service: Cardiovascular;  Laterality: N/A;   CARDIOVERSION N/A 01/18/2018   Procedure: CARDIOVERSION;  Surgeon: Larey Dresser, MD;  Location: [facility] ENDOSCOPY;  Service: Cardiovascular;  Laterality: N/A;   CARDIOVERSION N/A 08/19/2019   Procedure: CARDIOVERSION;  Surgeon: Thayer Headings, MD;  Location: [facility] ENDOSCOPY;  Service: Cardiovascular;  Laterality: N/A;   CARDIOVERSION N/A 04/18/2021   Procedure: CARDIOVERSION;  Surgeon: Acie Fredrickson Wonda Cheng, MD;  Location: [facility] ENDOSCOPY;  Service: Cardiovascular;  Laterality: N/A;   ELBOW LIGAMENT RECONSTRUCTION Right 09/29/2014   Procedure: Merian Capron FRACTURE FIXATION, POSSIBLE LIGAMENT REPAIR;  Surgeon: Nita Sells, MD;  Location: [place];  Service: Orthopedics;  Laterality: Right;  Right elbow coranoid fracture fixation, possible ligament repair   TEE WITHOUT CARDIOVERSION N/A 01/18/2018   Procedure: TRANSESOPHAGEAL ECHOCARDIOGRAM (TEE);  Surgeon: Larey Dresser, MD;  Location: [facility] ENDOSCOPY;  Service: Cardiovascular;  Laterality: N/A;   TEE WITHOUT CARDIOVERSION N/A 04/18/2021   Procedure: TRANSESOPHAGEAL ECHOCARDIOGRAM (TEE);  Surgeon: Acie Fredrickson Wonda Cheng, MD;  Location: [place];  Service: Cardiovascular;  Laterality: N/A;   WISDOM TOOTH EXTRACTION  Family History  Problem Relation Age of Onset   Hypertension Paternal Uncle    Hypertension Maternal Grandmother    Hypertension Paternal Uncle    Hypertension Paternal Uncle    Hypertension Paternal Uncle    Hypertension Paternal Uncle    Hypertension Mother    Stroke Father    Heart disease Father     Heart attack Father    Hypertension Father    Colon cancer Neg Hx     Social History   Tobacco Use   Smoking status: Every Day    Types: Cigars    Last attempt to quit: 02/03/2016    Years since quitting: 5.3   Smokeless tobacco: Never  Vaping Use   Vaping Use: Never used  Substance Use Topics   Alcohol use: Yes    Alcohol/week: 0.0 standard drinks    Comment: occ   Drug use: No    Home Medications Prior to Admission medications   Medication Sig Start Date End Date Taking? Authorizing Provider  allopurinol (ZYLOPRIM) 100 MG tablet TAKE 1 TABLET BY MOUTH ONCE DAILY FOR GOUT 06/20/21  Yes Skeet Latch, MD  colchicine 0.6 MG tablet Take 1 tablet (0.6 mg total) by mouth daily as needed (gout flare). 05/05/21  Yes Loel Dubonnet, NP  dapagliflozin propanediol (FARXIGA) 10 MG TABS tablet Take 1 tablet (10 mg total) by mouth daily. 06/20/21  Yes Skeet Latch, MD  diltiazem (CARDIZEM CD) 300 MG 24 hr capsule Take 1 capsule (300 mg total) by mouth daily. 04/19/21  Yes Margie Billet, NP  furosemide (LASIX) 40 MG tablet Take 1 tablet (40 mg total) by mouth daily. 04/18/21  Yes Margie Billet, NP  pantoprazole (PROTONIX) 40 MG tablet Take 1 tablet (40 mg total) by mouth daily. 08/11/19  Yes Fulp, Cammie, MD  traZODone (DESYREL) 50 MG tablet Take 0.5-1 tablets (25-50 mg total) by mouth at bedtime as needed for sleep. 05/04/20  Yes Nicolette Bang, MD  XARELTO 20 MG TABS tablet Take 1 tablet (20 mg total) by mouth daily with supper. 04/18/21  Yes Margie Billet, NP  acetaminophen (TYLENOL) 325 MG tablet Take 2 tablets (650 mg total) by mouth every 4 (four) hours as needed for headache or mild pain. 04/18/21   Margie Billet, NP    Allergies    Patient has no known allergies.  Review of Systems   Review of Systems  Constitutional:  Positive for appetite change and fatigue. Negative for activity change, chills and fever.  HENT:  Negative for ear pain and sore throat.   Eyes:  Negative for  pain and visual disturbance.  Respiratory:  Positive for shortness of breath. Negative for cough.   Cardiovascular:  Positive for chest pain and palpitations. Negative for leg swelling.  Gastrointestinal:  Negative for abdominal pain, diarrhea, nausea and vomiting.  Genitourinary:  Negative for dysuria, flank pain and hematuria.  Musculoskeletal:  Negative for arthralgias, back pain, myalgias and neck pain.  Skin:  Negative for color change and rash.  Neurological:  Negative for dizziness, seizures, syncope, weakness, light-headedness, numbness and headaches.  Hematological:  Bruises/bleeds easily (On Xarelto).  Psychiatric/Behavioral:  Negative for confusion and decreased concentration.   All other systems reviewed and are negative.  Physical Exam Updated Vital Signs BP (!) 131/93 (BP Location: Right Arm)   Pulse 79   Temp 98 F (36.7 C) (Oral)   Resp 12   SpO2 96%   Physical Exam Vitals and nursing note reviewed.  Constitutional:  General: He is not in acute distress.    Appearance: Normal appearance. He is well-developed. He is not ill-appearing, toxic-appearing or diaphoretic.  HENT:     Head: Normocephalic and atraumatic.     Right Ear: External ear normal.     Left Ear: External ear normal.     Nose: Nose normal.     Mouth/Throat:     Mouth: Mucous membranes are moist.     Pharynx: Oropharynx is clear.  Eyes:     Conjunctiva/sclera: Conjunctivae normal.  Cardiovascular:     Rate and Rhythm: Regular rhythm. Tachycardia present.     Heart sounds: No murmur heard. Pulmonary:     Effort: Pulmonary effort is normal. No respiratory distress.     Breath sounds: Normal breath sounds. No wheezing or rales.  Chest:     Chest wall: No tenderness.  Abdominal:     Palpations: Abdomen is soft.     Tenderness: There is no abdominal tenderness. There is no right CVA tenderness or left CVA tenderness.  Musculoskeletal:        General: Normal range of motion.     Cervical  back: Normal range of motion and neck supple.     Right lower leg: No edema.     Left lower leg: No edema.  Skin:    General: Skin is warm and dry.     Coloration: Skin is not jaundiced or pale.  Neurological:     General: No focal deficit present.     Mental Status: He is alert and oriented to person, place, and time.     Cranial Nerves: No cranial nerve deficit.     Sensory: No sensory deficit.     Motor: No weakness.  Psychiatric:        Mood and Affect: Mood normal.        Behavior: Behavior normal.    ED Results / Procedures / Treatments   Labs (all labs ordered are listed, but only abnormal results are displayed) Labs Reviewed  BASIC METABOLIC PANEL - Abnormal; Notable for the following components:      Result Value   Chloride 97 (*)    Glucose, Bld 143 (*)    All other components within normal limits  CBC - Abnormal; Notable for the following components:   RBC 5.95 (*)    RDW 19.1 (*)    All other components within normal limits  MAGNESIUM  TROPONIN I (HIGH SENSITIVITY)    EKG EKG Interpretation  Date/Time:  Wednesday June 22 2021 12:37:16 EDT Ventricular Rate:  75 PR Interval:  143 QRS Duration: 95 QT Interval:  388 QTC Calculation: 434 R Axis:   43 Text Interpretation: Sinus rhythm Abnormal R-wave progression, early transition Abnormal T, consider ischemia, diffuse leads Confirmed by Godfrey Pick [phone]) on 06/22/2021 12:45:39 PM  Radiology DG Chest Port 1 View  Result Date: 06/22/2021 CLINICAL DATA:  Atrial fibrillation EXAM: PORTABLE CHEST 1 VIEW COMPARISON:  August 2022 FINDINGS: Stable cardiomediastinal contours. No new consolidation or edema. No pleural effusion. IMPRESSION: No acute process in the chest. Electronically Signed   By: Macy Mis M.D.   On: 06/22/2021 10:15    Procedures .Sedation  Date/Time: 06/22/2021 11:11 AM Performed by: Godfrey Pick, MD Authorized by: Godfrey Pick, MD   Consent:    Consent obtained:  Verbal and written    Consent given by:  Patient   Risks discussed:  Allergic reaction, dysrhythmia and inadequate sedation Universal protocol:    Procedure explained and  questions answered to patient or proxy's satisfaction: yes     Immediately prior to procedure, a time out was called: yes     Patient identity confirmed:  Verbally with patient Indications:    Procedure performed:  Cardioversion   Procedure necessitating sedation performed by:  Physician performing sedation Pre-sedation assessment:    Time since last food or drink:  36 hours   ASA classification: class 3 - patient with severe systemic disease     Mouth opening:  2 finger widths   Thyromental distance:  2 finger widths   Mallampati score:  IV - only hard palate visible   Neck mobility: reduced     Pre-sedation assessments completed and reviewed: airway patency, cardiovascular function, hydration status, mental status, nausea/vomiting, pain level and respiratory function     Pre-sedation assessment completed:  06/22/2021 11:00 AM Immediate pre-procedure details:    Reassessment: Patient reassessed immediately prior to procedure     Reviewed: vital signs, relevant labs/tests and NPO status     Verified: bag valve mask available, emergency equipment available, intubation equipment available, IV patency confirmed, oxygen available and suction available   Procedure details (see MAR for exact dosages):    Preoxygenation:  Nasal cannula   Sedation:  Etomidate   Intended level of sedation: deep   Analgesia:  None   Intra-procedure monitoring:  Blood pressure monitoring, cardiac monitor, continuous capnometry, continuous pulse oximetry, frequent LOC assessments and frequent vital sign checks   Intra-procedure events: none     Total Provider sedation time (minutes):  12 Post-procedure details:    Post-sedation assessment completed:  06/22/2021 11:13 AM   Attendance: Constant attendance by certified staff until patient recovered     Recovery:  Patient returned to pre-procedure baseline     Post-sedation assessments completed and reviewed: airway patency, cardiovascular function, hydration status, mental status, nausea/vomiting, pain level and respiratory function     Patient is stable for discharge or admission: yes     Procedure completion:  Tolerated well, no immediate complications .Cardioversion  Date/Time: 06/22/2021 11:05 AM Performed by: Godfrey Pick, MD Authorized by: Godfrey Pick, MD   Consent:    Consent obtained:  Written and verbal   Consent given by:  Patient   Risks discussed:  Induced arrhythmia and death   Alternatives discussed:  No treatment and rate-control medication Pre-procedure details:    Cardioversion basis:  Elective   Rhythm:  Atrial flutter   Electrode placement:  Anterior-posterior Patient sedated: Yes. Refer to sedation procedure documentation for details of sedation.  Attempt one:    Cardioversion mode:  Synchronous   Waveform:  Biphasic   Shock (Joules):  200   Shock outcome:  Conversion to normal sinus rhythm Post-procedure details:    Patient status:  Awake   Patient tolerance of procedure:  Tolerated well, no immediate complications   Medications Ordered in ED Medications  etomidate (AMIDATE) injection 13.78 mg (13.78 mg Intravenous Given 06/22/21 1104)  etomidate (AMIDATE) injection (13.8 mg Intravenous Given 06/22/21 1104)  potassium chloride SA (KLOR-CON) CR tablet 40 mEq (40 mEq Oral Given 06/22/21 1216)  magnesium oxide (MAG-OX) tablet 800 mg (800 mg Oral Given 06/22/21 1214)  oxyCODONE (Oxy IR/ROXICODONE) immediate release tablet 5 mg (5 mg Oral Given 06/22/21 1214)    ED Course  I have reviewed the triage vital signs and the nursing notes.  Pertinent labs & imaging results that were available during my care of the patient were reviewed by me and considered in my medical decision  making (see chart for details).    MDM Rules/Calculators/A&P                          CRITICAL CARE Performed by: Godfrey Pick   Total critical care time: 35 minutes  Critical care time was exclusive of separately billable procedures and treating other patients.  Critical care was necessary to treat or prevent imminent or life-threatening deterioration.  Critical care was time spent personally by me on the following activities: development of treatment plan with patient and/or surrogate as well as nursing, discussions with consultants, evaluation of patient's response to treatment, examination of patient, obtaining history from patient or surrogate, ordering and performing treatments and interventions, ordering and review of laboratory studies, ordering and review of radiographic studies, pulse oximetry and re-evaluation of patient's condition.   Patient is a 45 year old male with history of A. fib, presenting for 1 week of palpitations, chest pain, and shortness of breath.  Found to be tachycardic in the range of 150 at cardiology office prior to arrival.  EKG is consistent with atrial flutter with 2-1 conduction.  On arrival in the ED, patient is alert and oriented.  He continues to be tachycardic in the range of 150.  He continues to endorse chest pain or shortness of breath.  Lab work was obtained.  Patient confirms that he has not missed any doses of Xarelto.  I did discuss with cardiologist on-call who agrees with synchronized cardioversion.  Currently, patient has a cardiologist follow-up appointment scheduled for 5 days from now.  Patient was consented for procedural sedation and cardioversion.  He was slightly apprehensive due to a previous cardioversion that was traumatic for him.  Plan will be for adequate sedation with etomidate.  Patient successfully underwent cardioversion.  Sinus rhythm with a normal heart rate was obtained following first attempt.  Patient was monitored while recovering from sedation.  He was kept on bedside cardiac monitor following cardioversion and had  sustained normal sinus rhythm on repeat EKGs.  Laboratory work-up showed potassium and magnesium levels that were at the low range of normal.  Replacement electrolytes were given to optimize electrolytes.  Following post cardioversion observation in the ED, he was discharged in stable condition. Final Clinical Impression(s) / ED Diagnoses Final diagnoses:  Tachycardia  Atrial flutter, unspecified type [facility])    Rx / DC Orders ED Discharge Orders     None        Godfrey Pick, MD 06/23/21 [id number]

## 2021-06-22 NOTE — Progress Notes (Signed)
Office Visit    Patient Name: Samuel Flynn Date of Encounter: 06/22/2021  PCP:  Nicolette Bang, MD   [place]  Cardiologist:  Skeet Latch, MD  Advanced Practice Provider:  No care team member to display Electrophysiologist:  Will Meredith Leeds, MD      Chief Complaint    Samuel Flynn is a 45 y.o. male with a hx of atrial flutter, atrial fibrillation s/p ablation, OSA, HFpEF, DM2, HTN, gout presents today for palpitations  Past Medical History    Past Medical History:  Diagnosis Date   Atrial fibrillation with RVR ([place])    Gastric ulcer    Gout    Hypertension    Paroxysmal A-fib ([place])    Sleep apnea    USES CPAP   Wears glasses    Past Surgical History:  Procedure Laterality Date   ATRIAL FIBRILLATION ABLATION N/A 04/26/2018   Procedure: ATRIAL FIBRILLATION ABLATION;  Surgeon: Constance Haw, MD;  Location: [place] [facility];  Service: Cardiovascular;  Laterality: N/A;   ATRIAL FIBRILLATION ABLATION N/A 11/07/2019   Procedure: ATRIAL FIBRILLATION ABLATION;  Surgeon: Constance Haw, MD;  Location: [place] [facility];  Service: Cardiovascular;  Laterality: N/A;   CARDIOVERSION N/A 01/18/2018   Procedure: CARDIOVERSION;  Surgeon: Larey Dresser, MD;  Location: [facility] ENDOSCOPY;  Service: Cardiovascular;  Laterality: N/A;   CARDIOVERSION N/A 08/19/2019   Procedure: CARDIOVERSION;  Surgeon: Thayer Headings, MD;  Location: [facility] ENDOSCOPY;  Service: Cardiovascular;  Laterality: N/A;   CARDIOVERSION N/A 04/18/2021   Procedure: CARDIOVERSION;  Surgeon: Acie Fredrickson Wonda Cheng, MD;  Location: [facility] ENDOSCOPY;  Service: Cardiovascular;  Laterality: N/A;   ELBOW LIGAMENT RECONSTRUCTION Right 09/29/2014   Procedure: Merian Capron FRACTURE FIXATION, POSSIBLE LIGAMENT REPAIR;  Surgeon: Nita Sells, MD;  Location: [place];  Service: Orthopedics;  Laterality: Right;  Right elbow coranoid fracture fixation,  possible ligament repair   TEE WITHOUT CARDIOVERSION N/A 01/18/2018   Procedure: TRANSESOPHAGEAL ECHOCARDIOGRAM (TEE);  Surgeon: Larey Dresser, MD;  Location: [facility] ENDOSCOPY;  Service: Cardiovascular;  Laterality: N/A;   TEE WITHOUT CARDIOVERSION N/A 04/18/2021   Procedure: TRANSESOPHAGEAL ECHOCARDIOGRAM (TEE);  Surgeon: Thayer Headings, MD;  Location: [facility] ENDOSCOPY;  Service: Cardiovascular;  Laterality: N/A;   WISDOM TOOTH EXTRACTION      Allergies  No Known Allergies  History of Present Illness    Samuel Flynn is a 45 y.o. male with a hx of atrial flutter, atrial fibrillation s/p ablation, OSA, HFpEF, DM2, HTN, gout last seen 05/05/10.  He had atrial fibrillation ablation in 2021 with Dr. Curt Bears. His anticoagulation was subsequently discontinued.   Admitted 04/15/21 after not feeling well for one month with edema, DOE, weight gain, PND, orthopnea, palpitations. He was in atrial flutter. Echo LVEF 55-60%, no rrWMA, mild LVH, no valvular disease. He was started on Diltiazem and Xarelto. He underwent cardioversion during admission.   Seen 05/05/21 noting intermittent palpitations. Initial HR by pulse ox 120 bpm though subsequent EKG with SB 59 bpm. He noted gout and uric acid level ordered as well as BMP, CBC but he did not have labs collected as recommended. 14 day ZIO placed in clinic.  He was contacted 06/15/21 as we had not yet received monitor results. He was able to be reached 06/22/21. Was unsure about monitor but was going to ask his wife. He endorsed lightheadedness, dizziness, "heart is out again", chest pain, shortness of breath. He was scheduled for visit  today.   Presents today for follow up. EKG shows atrial flutter 151 bpm. Reports feeling like he was out of rhythm since last Wednesday. Did not contact office - but noted palpitations when we called him to discuss returning ZIO monitor. Endorses palpitations associated with chest pain, dyspnea. Does not have way to check BP or  HR at home.  EKGs/Labs/Other Studies Reviewed:   The following studies were reviewed today:  TEE from 04/18/21:   Left Ventrical:  normal LV function .   Mitral Valve: mild MR    Aortic Valve: normal 3 leaflet valve, no AS o rAI    Tricuspid Valve: normal valve,    Pulmonic Valve: normal    Left Atrium/ Left atrial appendage: no thrombi,    Atrial septum: no obvious asd or PFO with color doppler    Aorta: normal.   Complications: No apparent complications Patient did tolerate procedure well.     DCCV on 04/18/21:   Procedure Details Consent: Obtained Time Out: Verified patient identification, verified procedure, site/side was marked, verified correct patient position, special equipment/implants available, Radiology Safety Procedures followed,  medications/allergies/relevent history reviewed, required imaging and test results available.  Performed   The patient has been on adequate anticoagulation.  The patient received Lidocaine and propofol ( see above)  for sedation.  Synchronous cardioversion was performed at 200  joules.   The cardioversion was successful.    Complications: No apparent complications Patient did tolerate procedure well.   Echo 04/16/21    1. Left ventricular ejection fraction, by estimation, is 55 to 60%. The  left ventricle has normal function. The left ventricle has no regional  wall motion abnormalities. The left ventricular internal cavity size was  mildly dilated. There is mild left ventricular hypertrophy. Left ventricular diastolic parameters are  indeterminate.   2. Right ventricular systolic function is normal. The right ventricular  size is normal.   3. The mitral valve is normal in structure. No evidence of mitral valve  regurgitation. No evidence of mitral stenosis.   4. The aortic valve is tricuspid. Aortic valve regurgitation is not  visualized. No aortic stenosis is present.   5. The inferior vena cava is normal in size with  greater than 50%  respiratory variability, suggesting right atrial pressure of 3 mmHg.    _____________  EKG:  EKG is ordered today.  The ekg ordered today demonstrates atrial flutter 151 bpm.  Recent Labs: 04/15/2021: ALT 22; Magnesium 2.2 04/16/2021: TSH 2.620 04/18/2021: BUN 14; Creatinine, Ser 1.30; Hemoglobin 14.1; Platelets 213; Potassium 4.1; Sodium 138  Recent Lipid Panel    Component Value Date/Time   CHOL 203 (H) 04/16/2021 0413   TRIG 428 (H) 04/16/2021 0413   HDL 24 (L) 04/16/2021 0413   CHOLHDL 8.5 04/16/2021 0413   VLDL UNABLE TO CALCULATE IF TRIGLYCERIDE OVER 400 mg/dL 04/16/2021 0413   LDLCALC UNABLE TO CALCULATE IF TRIGLYCERIDE OVER 400 mg/dL 04/16/2021 0413   LDLDIRECT 107.0 (H) 04/16/2021 0413      Home Medications   No outpatient medications have been marked as taking for the 06/22/21 encounter (Appointment) with Loel Dubonnet, NP.     Review of Systems      All other systems reviewed and are otherwise negative except as noted above.  Physical Exam    VS:  There were no vitals taken for this visit. , BMI There is no height or weight on file to calculate BMI.  Wt Readings from Last 3 Encounters:  05/05/21 [place] Kitchen)  317 lb (143.8 kg)  04/18/21 (!) 315 lb (142.9 kg)  08/25/20 (!) 313 lb 3.2 oz (142.1 kg)     GEN: Well nourished, well developed, in no acute distress. HEENT: normal. Neck: Supple, no JVD, carotid bruits, or masses. Cardiac: irregularly irreguar and tachycardic, no murmurs, rubs, or gallops. No clubbing, cyanosis, edema.  Radials/PT 2+ and equal bilaterally.  Respiratory:  Respirations regular and unlabored, clear to auscultation bilaterally. GI: Soft, nontender, nondistended. MS: No deformity or atrophy. Skin: Warm and dry, no rash. Neuro:  Strength and sensation are intact. Psych: Normal affect.  Assessment & Plan    Atrial flutter / Atrial fibrillation - EKG today shows atrial flutter 151  bpm. No availability for outpatient DCCV  this week. Recommend evaluation in ED for consideration of DCCV. Follow up with Dr. Curt Bears 06/27/21 as scheduled. Continue Xarelto 20mg  QD due to CHads2vasc of at least 3 (DM2, HTN, HF). Denies bleeding complications. No missed doses over last 3 weeks. Will likely discuss AAD at upcoming visit with Dr. Curt Bears.   HTN - BP well controlled. Continue current antihypertensive regimen.    Diastolic heart failure - Weight down 14 lbs. Continue Lasix 40mg  QD with additional 40mg  PRN for weight gain 2 lbs overnight or 5 lbs in 1 week.  GDMT includes Lasix, Farxiga. Low salt diet, fluid restriction <2L encouraged.   DM2 - 04/15/21 A1c 6.9. Wilder Glade started during recent admission for additional HF benefit. Continue to follow with PCP.   OSA - CPAP compliance encouraged.   Gout - Continue to follow with PCP. Uric acid level ordered at clinic visit 05/05/21 but he did not have collected. Further refills of colchicine will need to come from primary care provider. Try to avoid prednisone given arrhythmia history.  Disposition: Patient sent to ED for consideration of cardioversion. Follow up as scheduled with Dr. Curt Bears  Signed, Loel Dubonnet, NP 06/22/2021, 7:56 AM [place]

## 2021-06-22 NOTE — Patient Instructions (Signed)
Medication Instructions:  Continue current medications.   *If you need a refill on your cardiac medications before your next appointment, please call your pharmacy*   Lab Work: None ordered today.   Testing/Procedures: EKG today shows atrial flutter 151 bpm.   Follow-Up: At [facility], you and your health needs are our priority.  As part of our continuing mission to provide you with exceptional heart care, we have created designated Provider Care Teams.  These Care Teams include your primary Cardiologist (physician) and Advanced Practice Providers (APPs -  Physician Assistants and Nurse Practitioners) who all work together to provide you with the care you need, when you need it.  We recommend signing up for the patient portal called "MyChart".  Sign up information is provided on this After Visit Summary.  MyChart is used to connect with patients for Virtual Visits (Telemedicine).  Patients are able to view lab/test results, encounter notes, upcoming appointments, etc.  Non-urgent messages can be sent to your provider as well.   To learn more about what you can do with MyChart, go to [email or web address].    Your next appointment:   As scheduled with Dr. Ileana Ladd   Other Instructions  Present to emergency department for evaluation of atrial flutter.

## 2021-06-22 NOTE — ED Triage Notes (Signed)
Pt saw cardiologist upstairs today for palpitations. Found to be in atrial flutter at a rate of 150

## 2021-06-22 NOTE — Discharge Instructions (Addendum)
Follow-up with your cardiologist next week.  Please return to the emergency department for any recurrence of symptoms.

## 2021-06-22 NOTE — ED Notes (Signed)
Witnessed Pt signature for consent

## 2021-06-22 NOTE — ED Notes (Signed)
Vagal maneuver attempted

## 2021-06-22 NOTE — ED Notes (Signed)
Pt dc home. [place] score 10. Pt assisted home with his wife ,Tye [place]. Pt ambulatory.

## 2021-06-23 ENCOUNTER — Other Ambulatory Visit: Payer: Self-pay

## 2021-06-27 ENCOUNTER — Ambulatory Visit: Payer: Self-pay | Admitting: Cardiology

## 2021-06-28 ENCOUNTER — Telehealth (HOSPITAL_BASED_OUTPATIENT_CLINIC_OR_DEPARTMENT_OTHER): Payer: Self-pay | Admitting: Family

## 2021-06-28 ENCOUNTER — Other Ambulatory Visit: Payer: Self-pay

## 2021-06-28 ENCOUNTER — Telehealth: Payer: Self-pay | Admitting: Licensed Clinical Social Worker

## 2021-06-28 NOTE — Telephone Encounter (Signed)
Recommend rescheduling EP follow up. Have [place] (EP scheduler) so she is aware. He is s/p ablation 04/26/18 and 11/07/19 now with recurrent atrial flutter requiring cardioversion in ED last week. Likely will require anti-arrhythmic but will defer to Dr. Curt Bears.  Follow up with gen cards [place] or APP) in 2-3 mos.   I have mailed him the [place] Application to complete.   Loel Dubonnet, NP

## 2021-06-28 NOTE — Telephone Encounter (Signed)
Attempted to reach pt, no answer and vm not set up. Monitor reported as lost in [place]. Also, pt no-showed for appt with Dr. Curt Bears yesterday.

## 2021-06-28 NOTE — Progress Notes (Addendum)
Heart and Vascular Care Navigation  06/28/2021  Samuel Flynn [facility] 04-Jun-1976 [id number]  Reason for Referral:  Engaged with patient by telephone for initial visit for Heart and Vascular Care Coordination.                                                                                                   Assessment:           LCSW received referral from NP regarding pt coverage as he has had ongoing medical work up and appears to only have [employer]. I was able to reach him this morning on cell at [phone]. Introduced self, role, reason for call. Pt confirmed home address and shares that he lives with his wife and two minor children. He is not currently employed, although his wife Juliann Pulse is working. He had tried to go back but "they would not give me a physical to return due to my heart issues." When asked the last time he applied for Medicaid he is not sure sharing that "my wife handles all of that."   He currently gets his medications from [facility], he is unsure if he has completed any patient assistance applications recommended in August by [facility] staff. LCSW shared that there are several programs that pt may be eligible for if he is ineligible for full Medicaid. Pt agreeable to these being sent to him at this time. I shared that there are also two additional patient assistance applications for his high cost medications (Xarelto and [place]). I will place these along with instructions into the mail also.   Pt shares that they receive SNAP (food stamp) assistance and that they are getting by with bills. I will f/u with this while working with pt to see if any additional assistance we can provide. Pt shares that he has had a tough time lately, his friend just passed and they had his funeral yesterday. I provided brief support, shared that I would f/u with pt again and if he desired any additional formal mental health support to please reach out and we can connect him to what may  be available for him.                         HRT/VAS Care Coordination     Patients Home Cardiology Office [place]   Outpatient Care Team Social Worker   Social Worker Name: Westley Hummer, LCSW, [place] arrangements for the past 2 months Single Family Home   Lives with: Spouse; Minor Children   Patient Current Insurance Coverage Self-Pay   Patient Has Concern With Paying Medical Bills Yes   Patient Concerns With Medical Bills ongoing medical work up with no Counselling psychologist Referrals: [employer] to screen for Medicaid (minor children in home), mailed [employer] and [employer] as f/u if not eligible per [employer] for [employer]   Does Patient Have Prescription Coverage? No   Patient Prescription Assistance Programs Patient Assistance Programs   Home Assistive Devices/Equipment None  Social History:                                                                             SDOH Screenings   Alcohol Screen: Not on file  Depression (PHQ2-9): Low Risk    PHQ-2 Score: 0  Financial Resource Strain: High Risk   Difficulty of Paying Living Expenses: Hard  Food Insecurity: No Food Insecurity   Worried About Charity fundraiser in the Last Year: Never true   Ran Out of Food in the Last Year: Never true  Housing: Low Risk    Last Housing Risk Score: 0  Physical Activity: Not on file  Social Connections: Not on file  Stress: Not on file  Tobacco Use: High Risk   Smoking Tobacco Use: Every Day   Smokeless Tobacco Use: Never   Passive Exposure: Not on file  Transportation Needs: No Transportation Needs   Lack of Transportation (Medical): No   Lack of Transportation (Non-Medical): No    SDOH Interventions: Financial Resources:  Sales promotion account executive Interventions: Development worker, community, Other (Comment) (sent to Development worker, community for [employer] screening, mailed [employer] and [employer]; offered additional resources as needed) Occupational hygienist  for [employer] Insecurity:  Food Insecurity Interventions: Intervention Not Indicated (recieves SNAP currently)  Housing Insecurity:  Housing Interventions: Intervention Not Indicated  Transportation:   Transportation Interventions: Intervention Not Indicated    Other Care Navigation Interventions:     Provided Pharmacy assistance resources Patient Assistance Programs   Follow-up plan:   LCSW f/u with [facility] who confirm pt needs to fill out pt assistance and PAP applications as he has been receiving it at a discounted rate but can only do so if completes assistance apps. I also sent pt account to Financial Counselor Shanon Rosser who will pass it on for screening by [employer] as pt has two minor children in home at this time. LCSW has mailed pt my card, the PAP applications for Wilder Glade and Xarelto, information about Medicaid and that [employer] will be screening him for eligibility. If deemed not eligible he has also been sent the applications for [employer] and [employer] . I will f/u within 2 weeks to ensure paperwork received, the screening has been initiated by [employer] and answer any additional questions. I remain available before that time should pt/pt wife need.

## 2021-06-30 ENCOUNTER — Encounter: Payer: Self-pay | Admitting: Cardiology

## 2021-06-30 ENCOUNTER — Ambulatory Visit (INDEPENDENT_AMBULATORY_CARE_PROVIDER_SITE_OTHER): Payer: Medicaid Other | Admitting: Cardiology

## 2021-06-30 ENCOUNTER — Other Ambulatory Visit: Payer: Self-pay

## 2021-06-30 VITALS — BP 160/82 | HR 74 | Ht 74.0 in | Wt 311.0 lb

## 2021-06-30 DIAGNOSIS — I4819 Other persistent atrial fibrillation: Secondary | ICD-10-CM | POA: Diagnosis not present

## 2021-06-30 MED ORDER — FLECAINIDE ACETATE 100 MG PO TABS: 100.0000 mg | ORAL_TABLET | Freq: Two times a day (BID) | ORAL | 3 refills | Status: DC

## 2021-06-30 NOTE — Progress Notes (Signed)
Electrophysiology Office Note   Date:  06/30/2021   ID:  Samuel Flynn, DOB [birth year], MRN [id number]  PCP:  Nicolette Bang, MD  Cardiologist:  Oval Linsey Primary Electrophysiologist:  Aaliya Maultsby Meredith Leeds, MD    Chief Complaint: AF   History of Present Illness: Samuel Flynn is a 45 y.o. male who is being seen today for the evaluation of AF at the request of Samuel Flynn*. Presenting today for electrophysiology evaluation.  He has a history significant for hypertension, obesity, OSA on CPAP, CKD, persistent atrial fibrillation.  He is status post ablation 04/26/2018.  He had more frequent episodes of atrial fibrillation and had a repeat ablation 11/07/2019.  Unfortunately he presented to the emergency room in atrial flutter 06/22/2021.  He had a cardioversion in the emergency room.  He feels that he has had more arrhythmias since that time.  He feels that he is back out of rhythm today.  Today, denies symptoms of orthopnea, PND, lower extremity edema, claudication, dizziness, presyncope, syncope, bleeding, or neurologic sequela. The patient is tolerating medications without difficulties.  He has chest pain, palpitations, shortness of breath associated with his atrial arrhythmias.  He feels that he is having an atrial arrhythmia today.  He would like to get back into normal rhythm.   Past Medical History:  Diagnosis Date   Atrial fibrillation with RVR ([place])    Gastric ulcer    Gout    Hypertension    Paroxysmal A-fib (HCC)    Sleep apnea    USES CPAP   Wears glasses    Past Surgical History:  Procedure Laterality Date   ATRIAL FIBRILLATION ABLATION N/A 04/26/2018   Procedure: ATRIAL FIBRILLATION ABLATION;  Surgeon: Constance Haw, MD;  Location: [place] [facility];  Service: Cardiovascular;  Laterality: N/A;   ATRIAL FIBRILLATION ABLATION N/A 11/07/2019   Procedure: ATRIAL FIBRILLATION ABLATION;  Surgeon: Constance Haw, MD;  Location: [place] [facility];  Service: Cardiovascular;  Laterality: N/A;   CARDIOVERSION N/A 01/18/2018   Procedure: CARDIOVERSION;  Surgeon: Larey Dresser, MD;  Location: [facility] ENDOSCOPY;  Service: Cardiovascular;  Laterality: N/A;   CARDIOVERSION N/A 08/19/2019   Procedure: CARDIOVERSION;  Surgeon: Thayer Headings, MD;  Location: [facility] ENDOSCOPY;  Service: Cardiovascular;  Laterality: N/A;   CARDIOVERSION N/A 04/18/2021   Procedure: CARDIOVERSION;  Surgeon: Acie Fredrickson Wonda Cheng, MD;  Location: [facility] ENDOSCOPY;  Service: Cardiovascular;  Laterality: N/A;   ELBOW LIGAMENT RECONSTRUCTION Right 09/29/2014   Procedure: Merian Capron FRACTURE FIXATION, POSSIBLE LIGAMENT REPAIR;  Surgeon: Nita Sells, MD;  Location: [place];  Service: Orthopedics;  Laterality: Right;  Right elbow coranoid fracture fixation, possible ligament repair   TEE WITHOUT CARDIOVERSION N/A 01/18/2018   Procedure: TRANSESOPHAGEAL ECHOCARDIOGRAM (TEE);  Surgeon: Larey Dresser, MD;  Location: [facility] ENDOSCOPY;  Service: Cardiovascular;  Laterality: N/A;   TEE WITHOUT CARDIOVERSION N/A 04/18/2021   Procedure: TRANSESOPHAGEAL ECHOCARDIOGRAM (TEE);  Surgeon: Thayer Headings, MD;  Location: [facility] ENDOSCOPY;  Service: Cardiovascular;  Laterality: N/A;   WISDOM TOOTH EXTRACTION       Current Outpatient Medications  Medication Sig Dispense Refill   acetaminophen (TYLENOL) 325 MG tablet Take 2 tablets (650 mg total) by mouth every 4 (four) hours as needed for headache or mild pain.     allopurinol (ZYLOPRIM) 100 MG tablet TAKE 1 TABLET BY MOUTH ONCE DAILY FOR GOUT 30 tablet 0   colchicine 0.6 MG tablet Take 1 tablet (0.6 mg total) by mouth daily  as needed (gout flare). 30 tablet 1   dapagliflozin propanediol (FARXIGA) 10 MG TABS tablet Take 1 tablet (10 mg total) by mouth daily. 30 tablet 2   diltiazem (CARDIZEM CD) 300 MG 24 hr capsule Take 1 capsule (300 mg total) by mouth daily. 30 capsule 2   flecainide (TAMBOCOR) 100 MG tablet Take 1  tablet (100 mg total) by mouth 2 (two) times daily. 60 tablet 3   furosemide (LASIX) 40 MG tablet Take 1 tablet (40 mg total) by mouth daily. 60 tablet 0   pantoprazole (PROTONIX) 40 MG tablet Take 1 tablet (40 mg total) by mouth daily. 90 tablet 4   traZODone (DESYREL) 50 MG tablet Take 0.5-1 tablets (25-50 mg total) by mouth at bedtime as needed for sleep. 30 tablet 3   XARELTO 20 MG TABS tablet Take 1 tablet (20 mg total) by mouth daily with supper. 30 tablet 2   No current facility-administered medications for this visit.    Allergies:   Patient has no known allergies.   Social History:  The patient  reports that he has been smoking cigars. He has Flynn used smokeless tobacco. He reports current alcohol use. He reports that he does not use drugs.   Family History:  The patient's family history includes Heart attack in his father; Heart disease in his father; Hypertension in his father, maternal grandmother, mother, paternal uncle, paternal uncle, paternal uncle, paternal uncle, and paternal uncle; Stroke in his father.   ROS:  Please see the history of present illness.   Otherwise, review of systems is positive for none.   All other systems are reviewed and negative.   PHYSICAL EXAM: VS:  BP (!) 160/82   Pulse 74   Ht 6\' 2"  (1.88 m)   Wt (!) 311 lb (141.1 kg)   SpO2 97%   BMI 39.93 kg/m  , BMI Body mass index is 39.93 kg/m. GEN: Well nourished, well developed, in no acute distress  HEENT: normal  Neck: no JVD, carotid bruits, or masses Cardiac: irregular; no murmurs, rubs, or gallops,no edema  Respiratory:  clear to auscultation bilaterally, normal work of breathing GI: soft, nontender, nondistended, + BS MS: no deformity or atrophy  Skin: warm and dry Neuro:  Strength and sensation are intact Psych: euthymic mood, full affect  EKG:  EKG is not ordered today. Personal review of the ekg ordered 06/22/21 shows atrial flutter, rate 154  Recent Labs: 04/15/2021: ALT  22 04/16/2021: TSH 2.620 06/22/2021: BUN 14; Creatinine, Ser 1.19; Hemoglobin 16.0; Magnesium 1.8; Platelets 312; Potassium 3.7; Sodium 138    Lipid Panel     Component Value Date/Time   CHOL 203 (H) 04/16/2021 0413   TRIG 428 (H) 04/16/2021 0413   HDL 24 (L) 04/16/2021 0413   CHOLHDL 8.5 04/16/2021 0413   VLDL UNABLE TO CALCULATE IF TRIGLYCERIDE OVER 400 mg/dL 04/16/2021 0413   LDLCALC UNABLE TO CALCULATE IF TRIGLYCERIDE OVER 400 mg/dL 04/16/2021 0413   LDLDIRECT 107.0 (H) 04/16/2021 0413     Wt Readings from Last 3 Encounters:  06/30/21 (!) 311 lb (141.1 kg)  06/22/21 (!) 303 lb 12.8 oz (137.8 kg)  05/05/21 (!) 317 lb (143.8 kg)      Other studies Reviewed: Additional studies/ records that were reviewed today include: TTE 07/14/19  Review of the above records today demonstrates:   1. Left ventricular ejection fraction, by visual estimation, is 55 to 60%. The left ventricle has normal function. There is mildly increased left ventricular hypertrophy. Appears  normal systolic function, though anterior wall is not well visualized  2. Left ventricular diastolic parameters are indeterminate.  3. Global right ventricle has normal systolic function.The right ventricular size is normal. No increase in right ventricular wall thickness.  4. Left atrial size was mildly dilated.  5. Right atrial size was normal.  6. The mitral valve is normal in structure. Mild mitral valve regurgitation.  7. The tricuspid valve is normal in structure. Tricuspid valve regurgitation is not demonstrated.  8. The aortic valve is tricuspid. Aortic valve regurgitation is not visualized. No evidence of aortic valve sclerosis or stenosis.  9. The pulmonic valve was not well visualized. Pulmonic valve regurgitation is not visualized. 10. The inferior vena cava is dilated in size with >50% respiratory variability, suggesting right atrial pressure of 8 mmHg. 11. TR signal is inadequate for assessing pulmonary artery  systolic pressure.   ASSESSMENT AND PLAN:  1.  Persistent atrial fibrillation: Status post ablation 04/26/2018 with repeat ablation 11/07/2019.  CHA2DS2-VASc of 1 currently on Xarelto 20 mg daily.  He is unfortunately in atrial fibrillation today by auscultation.  He is also had an emergency room visit for what appears to be a left atrial flutter.  He would prefer atrial fibrillation ablation, though he does not have the correct insurance.  We Chavela Justiniano work on getting him insurance.  In the interim, we Edwin Baines start flecainide 100 mg twice daily.  We Devynne Sturdivant have him follow-up in A. fib clinic.  Risk, benefits, and alternatives to EP study and radiofrequency ablation for afib were also discussed in detail today. These risks include but are not limited to stroke, bleeding, vascular damage, tamponade, perforation, damage to the esophagus, lungs, and other structures, pulmonary vein stenosis, worsening renal function, and death. The patient understands these risk and wishes to proceed.  We Rolla Servidio therefore proceed with catheter ablation at the next available time.  Carto, ICE, anesthesia are requested for the procedure.  Hisham Provence also obtain CT PV protocol prior to the procedure to exclude LAA thrombus and further evaluate atrial anatomy.   2.  Obstructive sleep apnea: CPAP compliance encouraged  1.  Persistent atrial fibrillation: Status post ablation 04/26/2018 with repeat ablation 11/07/2019.  CHA2DS2-VASc of 1.  He is remained in sinus rhythm since his ablation.  We Parneet Glantz thus stop his Xarelto.  2.  Obstructive sleep apnea: CPAP compliance encouraged    Current medicines are reviewed at length with the patient today.   The patient does not have concerns regarding his medicines.  The following changes were made today: Stop Xarelto  Labs/ tests ordered today include:  No orders of the defined types were placed in this encounter.    Disposition:   FU with Amiaya Mcneeley 3 months  Signed, Harmani Neto Meredith Leeds, MD   06/30/2021 3:40 PM     [address] [place] [address] [phone] (office) [phone] (fax)

## 2021-06-30 NOTE — Telephone Encounter (Signed)
Pt has been scheduled for appt today, 10/27, with Dr. Curt Bears.

## 2021-06-30 NOTE — Patient Instructions (Addendum)
Medication Instructions:  Your physician has recommended you make the following change in your medication:  START Flecainide 100 mg TWICE daily  *If you need a refill on your cardiac medications before your next appointment, please call your pharmacy*   Lab Work: None ordered   Testing/Procedures: None ordered   Follow-Up: At [facility], you and your health needs are our priority.  As part of our continuing mission to provide you with exceptional heart care, we have created designated Provider Care Teams.  These Care Teams include your primary Cardiologist (physician) and Advanced Practice Providers (APPs -  Physician Assistants and Nurse Practitioners) who all work together to provide you with the care you need, when you need it.  Your next appointment:   1 month(s)  The format for your next appointment:   In Person  Provider:   You will follow up in the [facility] located at [facility]. Your provider will be: Roderic Palau, NP or Clint R. Fenton, PA-C    Thank you for choosing [facility]!!   Trinidad Curet, RN [phone]   Other Instructions   Please let us know when your family planning medicaid is changed to regular medicaid and we can discuss scheduling an ablation then.

## 2021-07-01 ENCOUNTER — Ambulatory Visit (HOSPITAL_BASED_OUTPATIENT_CLINIC_OR_DEPARTMENT_OTHER): Payer: Medicaid Other | Admitting: Family

## 2021-07-07 ENCOUNTER — Telehealth: Payer: Self-pay | Admitting: Licensed Clinical Social Worker

## 2021-07-07 NOTE — Telephone Encounter (Signed)
LCSW sent email to [facility] regarding pt pending Medicaid application per account notes. Await updates for any missing items and will f/u and remind pt during my check in.    Westley Hummer, MSW, [place]  [phone]

## 2021-07-07 NOTE — Telephone Encounter (Signed)
LCSW attempted to reach pt via telephone this afternoon after receiving update from [place], that pt would be receiving paperwork from [facility] caseworker. No answer at [phone], was sent to voicemail and it has not been set up. Will reattempt again tomorrow.   Westley Hummer, MSW, [place]  [phone]

## 2021-07-08 ENCOUNTER — Telehealth: Payer: Self-pay | Admitting: Licensed Clinical Social Worker

## 2021-07-08 NOTE — Telephone Encounter (Deleted)
Was able to reach pt this morning at [phone]. Re-introduced self, role, reason for call. Confirmed he has received patient assistance applications. I encouraged him to hold onto the [employer]  and [employer] as he is being screened for full Medicaid. I let him know he should be receiving documents for completion from [place] and [place] financial counseling is following with

## 2021-07-08 NOTE — Telephone Encounter (Signed)
Was able to reach pt this morning at [phone]. Re-introduced self, role, reason for call. Confirmed he has received patient assistance applications. I encouraged him to hold onto the [employer]  and [employer] as he is being screened for full Medicaid. I let him know he should be receiving documents for completion from [place] and [place] financial counseling is following. I did mail pt both the Xarelto and Farxiga PAP applications. I noted that in Dr. Macky Lower note there had been some mention about discontinuing Xarelto but pt was only instructed to start a new medication. I have sent a message to [place], RN, inquiring what pt needs to be taking and if he does need the Xarelto still will remind him to complete PAP application.    Westley Hummer, MSW, [place]  [phone]

## 2021-07-10 ENCOUNTER — Other Ambulatory Visit: Payer: Self-pay | Admitting: Cardiology

## 2021-07-11 NOTE — Telephone Encounter (Signed)
Rx(s) sent to pharmacy electronically.  

## 2021-07-23 ENCOUNTER — Other Ambulatory Visit (HOSPITAL_BASED_OUTPATIENT_CLINIC_OR_DEPARTMENT_OTHER): Payer: Self-pay | Admitting: Cardiovascular Disease

## 2021-07-23 ENCOUNTER — Other Ambulatory Visit: Payer: Self-pay | Admitting: Home Health

## 2021-07-23 DIAGNOSIS — M1000 Idiopathic gout, unspecified site: Secondary | ICD-10-CM

## 2021-07-25 ENCOUNTER — Other Ambulatory Visit: Payer: Self-pay

## 2021-07-25 MED ORDER — DILTIAZEM HCL ER COATED BEADS 300 MG PO CP24
300.0000 mg | ORAL_CAPSULE | Freq: Every day | ORAL | 2 refills | Status: DC
  Filled 2021-07-25: qty 30, 30d supply, fill #0

## 2021-07-25 MED ORDER — XARELTO 20 MG PO TABS
20.0000 mg | ORAL_TABLET | Freq: Every day | ORAL | 2 refills | Status: DC
  Filled 2021-07-25: qty 30, 30d supply, fill #0
  Filled 2021-08-28: qty 30, 30d supply, fill #1
  Filled 2021-10-07 – 2021-10-11 (×2): qty 30, 30d supply, fill #0

## 2021-07-25 NOTE — Telephone Encounter (Signed)
Prescription refill request for Xarelto received.  Indication:Afib Last office visit:10/22 Weight:141.1 kg Age:45 Scr:1.1 CrCl:165.69 ml/min  Prescription refilled

## 2021-07-26 ENCOUNTER — Telehealth: Payer: Self-pay | Admitting: Licensed Clinical Social Worker

## 2021-07-26 NOTE — Telephone Encounter (Signed)
LCSW reviewed pt chart, was able to note that pt now has active [facility] coverage; confirmed w/ [employer] under ID #[id number]. I called pt and inquired if he was aware that he was approved, it was difficult to hear pt at times during call. He states that he was not aware, I shared that he would get a new patient packet in the mail with a card which he needs to bring with him to the pharmacy and any upcoming appointments. Pt was texted the customer service line for [facility] and the other information above at his request (to [phone]). I have added this to the upcoming appt he has also. I remain available for any additional questions/concerns.   Westley Hummer, MSW, [place]  [phone]- work cell phone (preferred) [phone]- desk phone

## 2021-07-27 ENCOUNTER — Other Ambulatory Visit: Payer: Self-pay

## 2021-08-02 ENCOUNTER — Inpatient Hospital Stay (HOSPITAL_COMMUNITY): Payer: Medicaid Other

## 2021-08-02 ENCOUNTER — Other Ambulatory Visit: Payer: Self-pay

## 2021-08-02 ENCOUNTER — Inpatient Hospital Stay (HOSPITAL_COMMUNITY)
Admission: EM | Admit: 2021-08-02 | Discharge: 2021-08-06 | DRG: 309 | Disposition: A | Discharge status: Home / Self Care | Payer: Medicaid Other | Attending: Internal Medicine | Admitting: Internal Medicine

## 2021-08-02 ENCOUNTER — Ambulatory Visit (HOSPITAL_COMMUNITY)
Admission: RE | Admit: 2021-08-02 | Discharge: 2021-08-02 | Disposition: A | Discharge status: Home / Self Care | Payer: Medicaid Other | Source: Ambulatory Visit | Attending: Nurse Practitioner | Admitting: Nurse Practitioner

## 2021-08-02 DIAGNOSIS — I4892 Unspecified atrial flutter: Secondary | ICD-10-CM | POA: Diagnosis not present

## 2021-08-02 DIAGNOSIS — I484 Atypical atrial flutter: Secondary | ICD-10-CM | POA: Diagnosis not present

## 2021-08-02 DIAGNOSIS — Z20822 Contact with and (suspected) exposure to covid-19: Secondary | ICD-10-CM | POA: Diagnosis not present

## 2021-08-02 DIAGNOSIS — I472 Ventricular tachycardia, unspecified: Secondary | ICD-10-CM | POA: Diagnosis not present

## 2021-08-02 DIAGNOSIS — I10 Essential (primary) hypertension: Secondary | ICD-10-CM | POA: Diagnosis not present

## 2021-08-02 DIAGNOSIS — Z8249 Family history of ischemic heart disease and other diseases of the circulatory system: Secondary | ICD-10-CM | POA: Diagnosis not present

## 2021-08-02 DIAGNOSIS — I4891 Unspecified atrial fibrillation: Secondary | ICD-10-CM

## 2021-08-02 DIAGNOSIS — G4733 Obstructive sleep apnea (adult) (pediatric): Secondary | ICD-10-CM | POA: Diagnosis not present

## 2021-08-02 DIAGNOSIS — Z8711 Personal history of peptic ulcer disease: Secondary | ICD-10-CM

## 2021-08-02 DIAGNOSIS — Z79899 Other long term (current) drug therapy: Secondary | ICD-10-CM

## 2021-08-02 DIAGNOSIS — M109 Gout, unspecified: Secondary | ICD-10-CM | POA: Diagnosis not present

## 2021-08-02 DIAGNOSIS — F17290 Nicotine dependence, other tobacco product, uncomplicated: Secondary | ICD-10-CM | POA: Diagnosis not present

## 2021-08-02 DIAGNOSIS — E876 Hypokalemia: Secondary | ICD-10-CM

## 2021-08-02 DIAGNOSIS — I4819 Other persistent atrial fibrillation: Secondary | ICD-10-CM | POA: Diagnosis not present

## 2021-08-02 DIAGNOSIS — R61 Generalized hyperhidrosis: Secondary | ICD-10-CM | POA: Insufficient documentation

## 2021-08-02 DIAGNOSIS — Z0389 Encounter for observation for other suspected diseases and conditions ruled out: Secondary | ICD-10-CM | POA: Diagnosis not present

## 2021-08-02 DIAGNOSIS — I248 Other forms of acute ischemic heart disease: Secondary | ICD-10-CM | POA: Diagnosis not present

## 2021-08-02 DIAGNOSIS — Z7901 Long term (current) use of anticoagulants: Secondary | ICD-10-CM | POA: Insufficient documentation

## 2021-08-02 DIAGNOSIS — Z823 Family history of stroke: Secondary | ICD-10-CM

## 2021-08-02 DIAGNOSIS — E669 Obesity, unspecified: Secondary | ICD-10-CM | POA: Diagnosis present

## 2021-08-02 DIAGNOSIS — Z6839 Body mass index (BMI) 39.0-39.9, adult: Secondary | ICD-10-CM

## 2021-08-02 DIAGNOSIS — Z419 Encounter for procedure for purposes other than remedying health state, unspecified: Secondary | ICD-10-CM | POA: Diagnosis not present

## 2021-08-02 LAB — BASIC METABOLIC PANEL
Anion gap: 13 (ref 5–15)
BUN: 13 mg/dL (ref 6–20)
CO2: 24 mmol/L (ref 22–32)
Calcium: 9.2 mg/dL (ref 8.9–10.3)
Chloride: 103 mmol/L (ref 98–111)
Creatinine, Ser: 1.33 mg/dL — ABNORMAL HIGH (ref 0.61–1.24)
GFR, Estimated: >60 mL/min (ref >60)
Glucose, Bld: 145 mg/dL — ABNORMAL HIGH (ref 70–99)
Potassium: 3.7 mmol/L (ref 3.5–5.1)
Sodium: 140 mmol/L (ref 135–145)

## 2021-08-02 LAB — RAPID URINE DRUG SCREEN, HOSP PERFORMED
Amphetamines: NOT DETECTED
Barbiturates: NOT DETECTED
Benzodiazepines: NOT DETECTED
Cocaine: NOT DETECTED
Opiates: NOT DETECTED
Tetrahydrocannabinol: NOT DETECTED

## 2021-08-02 LAB — RESP PANEL BY RT-PCR (FLU A&B, COVID) ARPGX2
Influenza A by PCR: NEGATIVE
Influenza B by PCR: NEGATIVE
SARS Coronavirus 2 by RT PCR: NEGATIVE

## 2021-08-02 LAB — MAGNESIUM: Magnesium: 1.9 mg/dL (ref 1.7–2.4)

## 2021-08-02 LAB — TROPONIN I (HIGH SENSITIVITY)
Troponin I (High Sensitivity): 35 ng/L — ABNORMAL HIGH (ref <18)
Troponin I (High Sensitivity): 50 ng/L — ABNORMAL HIGH (ref <18)

## 2021-08-02 MED ORDER — PROPOFOL 10 MG/ML IV BOLUS
INTRAVENOUS | Status: DC | PRN
  Administered 2021-08-02: 20 mg via INTRAVENOUS

## 2021-08-02 MED ORDER — ACETAMINOPHEN 325 MG PO TABS
650.0000 mg | ORAL_TABLET | ORAL | Status: DC | PRN
  Administered 2021-08-03: 650 mg via ORAL
  Filled 2021-08-02: qty 2

## 2021-08-02 MED ORDER — POTASSIUM CHLORIDE CRYS ER 20 MEQ PO TBCR
40.0000 meq | EXTENDED_RELEASE_TABLET | Freq: Once | ORAL | Status: AC
  Administered 2021-08-02: 40 meq via ORAL

## 2021-08-02 MED ORDER — PROPOFOL 10 MG/ML IV BOLUS
INTRAVENOUS | Status: AC | PRN
  Administered 2021-08-02 (×2): 70 mg via INTRAVENOUS
  Administered 2021-08-02: 30 mg via INTRAVENOUS

## 2021-08-02 MED ORDER — DILTIAZEM HCL 25 MG/5ML IV SOLN
10.0000 mg | Freq: Once | INTRAVENOUS | Status: AC
  Administered 2021-08-02: 10 mg via INTRAVENOUS

## 2021-08-02 MED ORDER — MAGNESIUM SULFATE 2 GM/50ML IV SOLN
2.0000 g | Freq: Once | INTRAVENOUS | Status: AC
  Administered 2021-08-02: 2 g via INTRAVENOUS

## 2021-08-02 MED ORDER — PROPOFOL 500 MG/50ML IV EMUL: INTRAVENOUS | Status: AC | PRN

## 2021-08-02 MED ORDER — ETOMIDATE 2 MG/ML IV SOLN: 10.0000 mg | Freq: Once | INTRAVENOUS | Status: DC

## 2021-08-02 MED ORDER — ONDANSETRON HCL 4 MG/2ML IJ SOLN: 4.0000 mg | Freq: Four times a day (QID) | INTRAMUSCULAR | Status: DC | PRN

## 2021-08-02 MED ORDER — FENTANYL CITRATE PF 50 MCG/ML IJ SOSY
25.0000 ug | PREFILLED_SYRINGE | Freq: Once | INTRAMUSCULAR | Status: AC
  Administered 2021-08-02: 25 ug via INTRAVENOUS

## 2021-08-02 NOTE — H&P (Addendum)
ELECTROPHYSIOLOGY CONSULT NOTE    Patient ID: Samuel Flynn MRN: [id number], DOB/AGE: 47-Apr-1977 47 y.o.  Admit date: 08/02/2021 Date of Consult: 08/02/2021  Primary Physician: Nicolette Bang, MD Primary Cardiologist: Skeet Latch, MD  Electrophysiologist: Dr. Curt Bears  Referring Provider: Dr. Matilde Sprang  Patient Profile: Samuel Flynn is a 45 y.o. male with a history of persistent atrial fibrillation s/p ablation 04/2018 and 11/2019, paroxysmal atrial flutter, OSA, and CKD who is being seen today for the evaluation of AFL with RVR at the request of Dr. Matilde Sprang.  HPI:  Samuel Flynn is a 45 y.o. male with medical history as above.   Presented to [facility] 06/22/21 with atrial flutter with RVR 06/22/2021 and underwent cardioversion.   Seen by Dr. Curt Bears 06/30/2021. Feeling OK, but felt he was still intermittently going out of rhythm. Complaints included CP, SOB, and palpitations. He was started on flecainide 100 mg BID and planned for tentative ablation of his atrial flutter pending insurance.   He presented to AF clinic this afternoon with not feeling well for the past month. Noted to be in atrial flutter with 1:1 conduction at 236 bpm. No BP was palpable and he was diaphroretic, thus he was sent to the ED.   He denies missing any doses of his flecainide or Xarelto. He cannot remember if he was taking diltiazem (though is just waking up from [facility] sedation).  He states he now has [employer], and should be able to afford the usual $40-80 price of tikosyn regardless of his coverage.  He has continued to have chest discomfort, SOB, and CP intermittently with his atrial arrhythmias.    Past Medical History:  Diagnosis Date   Atrial fibrillation with RVR ([place])    Gastric ulcer    Gout    Hypertension    Paroxysmal A-fib (HCC)    Sleep apnea    USES CPAP   Wears glasses      Surgical History:  Past Surgical History:  Procedure Laterality Date   ATRIAL  FIBRILLATION ABLATION N/A 04/26/2018   Procedure: ATRIAL FIBRILLATION ABLATION;  Surgeon: Constance Haw, MD;  Location: [place] [facility];  Service: Cardiovascular;  Laterality: N/A;   ATRIAL FIBRILLATION ABLATION N/A 11/07/2019   Procedure: ATRIAL FIBRILLATION ABLATION;  Surgeon: Constance Haw, MD;  Location: [place] CV LAB;  Service: Cardiovascular;  Laterality: N/A;   CARDIOVERSION N/A 01/18/2018   Procedure: CARDIOVERSION;  Surgeon: Larey Dresser, MD;  Location: [facility] ENDOSCOPY;  Service: Cardiovascular;  Laterality: N/A;   CARDIOVERSION N/A 08/19/2019   Procedure: CARDIOVERSION;  Surgeon: Thayer Headings, MD;  Location: [facility] ENDOSCOPY;  Service: Cardiovascular;  Laterality: N/A;   CARDIOVERSION N/A 04/18/2021   Procedure: CARDIOVERSION;  Surgeon: Acie Fredrickson Wonda Cheng, MD;  Location: [facility] ENDOSCOPY;  Service: Cardiovascular;  Laterality: N/A;   ELBOW LIGAMENT RECONSTRUCTION Right 09/29/2014   Procedure: Merian Capron FRACTURE FIXATION, POSSIBLE LIGAMENT REPAIR;  Surgeon: Nita Sells, MD;  Location: [place];  Service: Orthopedics;  Laterality: Right;  Right elbow coranoid fracture fixation, possible ligament repair   TEE WITHOUT CARDIOVERSION N/A 01/18/2018   Procedure: TRANSESOPHAGEAL ECHOCARDIOGRAM (TEE);  Surgeon: Larey Dresser, MD;  Location: [facility] ENDOSCOPY;  Service: Cardiovascular;  Laterality: N/A;   TEE WITHOUT CARDIOVERSION N/A 04/18/2021   Procedure: TRANSESOPHAGEAL ECHOCARDIOGRAM (TEE);  Surgeon: Acie Fredrickson Wonda Cheng, MD;  Location: [facility] ENDOSCOPY;  Service: Cardiovascular;  Laterality: N/A;   WISDOM TOOTH EXTRACTION       (Not in a hospital admission)  Inpatient Medications:   etomidate  10 mg Intravenous Once    Allergies: No Known Allergies  Social History   Socioeconomic History   Marital status: Married    Spouse name: Not on file   Number of children: 4   Years of education: 10   Highest education level: Not on file  Occupational  History   Occupation: Truck Education administrator: [employer]  Tobacco Use   Smoking status: Every Day    Types: Cigars    Last attempt to quit: 02/03/2016    Years since quitting: 5.4   Smokeless tobacco: Never  Vaping Use   Vaping Use: Never used  Substance and Sexual Activity   Alcohol use: Yes    Alcohol/week: 0.0 standard drinks    Comment: occ   Drug use: No   Sexual activity: Yes    Comment: 5 a day  Other Topics Concern   Not on file  Social History Narrative   Not on file   Social Determinants of Health   Financial Resource Strain: High Risk   Difficulty of Paying Living Expenses: Hard  Food Insecurity: No Food Insecurity   Worried About Running Out of Food in the Last Year: Never true   Ran Out of Food in the Last Year: Never true  Transportation Needs: No Transportation Needs   Lack of Transportation (Medical): No   Lack of Transportation (Non-Medical): No  Physical Activity: Not on file  Stress: Not on file  Social Connections: Not on file  Intimate Partner Violence: Not on file     Family History  Problem Relation Age of Onset   Hypertension Paternal Uncle    Hypertension Maternal Grandmother    Hypertension Paternal Uncle    Hypertension Paternal Uncle    Hypertension Paternal Uncle    Hypertension Paternal Uncle    Hypertension Mother    Stroke Father    Heart disease Father    Heart attack Father    Hypertension Father    Colon cancer Neg Hx      Review of Systems: All other systems reviewed and are otherwise negative except as noted above.  Physical Exam: Vitals:   08/02/21 1535 08/02/21 1535 08/02/21 1540 08/02/21 1545  BP:    (!) 157/131  Pulse: (!) 233  (!) 237 (!) 237  Resp: 14  (!) 25 (!) 22  Temp:  98.5 F (36.9 C)    TempSrc:  Oral    SpO2: 97%  97% 95%    GEN- The patient is well appearing, alert and oriented x 3 today.   HEENT: normocephalic, atraumatic; sclera clear, conjunctiva pink; hearing intact; oropharynx  clear; neck supple Lungs- Clear to ausculation bilaterally, normal work of breathing.  No wheezes, rales, rhonchi Heart- Regular rate and rhythm, no murmurs, rubs or gallops GI- Obese, soft, non-tender, non-distended, bowel sounds present Extremities- no clubbing, cyanosis, or edema; DP/PT/radial pulses 2+ bilaterally MS- no significant deformity or atrophy Skin- warm and dry, no rash or lesion Psych- euthymic mood, full affect Neuro- strength and sensation are intact  Labs:   Lab Results  Component Value Date   WBC 7.5 06/22/2021   HGB 16.0 06/22/2021   HCT 48.3 06/22/2021   MCV 81.2 06/22/2021   PLT 312 06/22/2021   No results for input(s): NA, K, CL, CO2, BUN, CREATININE, CALCIUM, PROT, BILITOT, ALKPHOS, ALT, AST, GLUCOSE in the last 168 hours.  Invalid input(s): LABALBU    Radiology/Studies: No results found.  EKG:on  arrival shows 1:1 atrial flutter in 240s (personally reviewed)  TELEMETRY: Atrial flutter with RVR on arrival, cardioverted to NSR 90s (personally reviewed)  Assessment/Plan: 1.  Paroxysmal atrial flutter with RVR Contnue Xarelto 20 mg daily Continue diltiazem 300 mg daily Echo 04/2021 LVEF 55-60% Do NOT start amiodarone.  Do NOT continue flecainide Plan to start Tikosyn tomorrow after flecainide washout.  Tentatively planning ablation pending insurance  Discussed with Dr. Curt Bears who would recommend stopping flecainide and starting Tikosyn.  Keep K > 4.0 and Mg > 2.0. Labs pending at this time. Plan to supp accordingly.  2. Obesity Lifestyle modification has been advised.  3. OSA Continue CPAP  Dr. Rayann Heman has seen. Plan to admit for tikosyn, likely tomorrow evening after flecainide wash out.   For questions or updates, please contact [place] Please consult [email or web address] for contact info under Cardiology/STEMI.  Signed, Shirley Friar, PA-C  08/02/2021 3:52 PM  I have seen, examined the patient, and reviewed the above assessment  and plan.  Changes to above are made where necessary.  On exam, RRR.  The patient is admitted for further management of atypical atrial flutter and afib.  I have spoken with Dr Curt Bears who advises stopping flecainide and starting on tikosyn.  We will admit for further management.  Co Sign: Thompson Grayer, MD

## 2021-08-02 NOTE — ED Notes (Signed)
Pt came in w HR in the 200s. PA Deatra Canter made aware.

## 2021-08-02 NOTE — ED Provider Notes (Signed)
[facility] EMERGENCY DEPARTMENT Provider Note   CSN: [id number] Arrival date & time: 08/02/21  1526     History Chief Complaint  Patient presents with   Atrial Fibrillation    Samuel Flynn is a 45 y.o. male with PMH A. fib versus a flutter status post ablation x2 currently on flecainide, sleep apnea, HTN who presents emergency department for evaluation of tachycardia, diaphoresis.  Patient was in [place] clinic this morning and was found to have accelerated ventricular rates in the 230s with associated diaphoresis and was transferred to the emergency department for emergent evaluation.  He arrives with no complaints of chest pain, shortness of breath, abdominal pain, nausea, vomiting and states that he feels that he has been in this tachycardia for weeks.  Patient has been compliant with his Xarelto.   Atrial Fibrillation Pertinent negatives include no chest pain, no abdominal pain and no shortness of breath.      Past Medical History:  Diagnosis Date   Atrial fibrillation with RVR ([place])    Gastric ulcer    Gout    Hypertension    Paroxysmal A-fib (HCC)    Sleep apnea    USES CPAP   Wears glasses     Patient Active Problem List   Diagnosis Date Noted   Heart failure, type unknown ([place]) 04/16/2021   Atrial fibrillation with rapid ventricular response ([place]) 04/15/2021   Persistent atrial fibrillation (HCC)    Acute on chronic diastolic heart failure ([place]) 07/15/2019   Atrial flutter ([place]) 07/13/2019   Normal coronary arteries 04/11/2019   OSA (obstructive sleep apnea) 08/13/2018   GAD (generalized anxiety disorder) 08/13/2018   Acute pulmonary edema ([place]) 08/13/2018   S/P ablation of atrial fibrillation 04/26/18 04/27/2018   Paroxysmal atrial fibrillation ([place]) 04/26/2018   Atrial fibrillation with RVR ([place]) 04/10/2016   Atrial flutter with rapid ventricular response ([place]) 10/18/2015   Essential hypertension, benign 02/19/2014   Gout 12/27/2006    Morbid obesity ([place]) 12/27/2006    Past Surgical History:  Procedure Laterality Date   ATRIAL FIBRILLATION ABLATION N/A 04/26/2018   Procedure: ATRIAL FIBRILLATION ABLATION;  Surgeon: Constance Haw, MD;  Location: [place] CV LAB;  Service: Cardiovascular;  Laterality: N/A;   ATRIAL FIBRILLATION ABLATION N/A 11/07/2019   Procedure: ATRIAL FIBRILLATION ABLATION;  Surgeon: Constance Haw, MD;  Location: [place] CV LAB;  Service: Cardiovascular;  Laterality: N/A;   CARDIOVERSION N/A 01/18/2018   Procedure: CARDIOVERSION;  Surgeon: Larey Dresser, MD;  Location: [facility] ENDOSCOPY;  Service: Cardiovascular;  Laterality: N/A;   CARDIOVERSION N/A 08/19/2019   Procedure: CARDIOVERSION;  Surgeon: Thayer Headings, MD;  Location: [facility] ENDOSCOPY;  Service: Cardiovascular;  Laterality: N/A;   CARDIOVERSION N/A 04/18/2021   Procedure: CARDIOVERSION;  Surgeon: Acie Fredrickson Wonda Cheng, MD;  Location: [facility] ENDOSCOPY;  Service: Cardiovascular;  Laterality: N/A;   ELBOW LIGAMENT RECONSTRUCTION Right 09/29/2014   Procedure: Merian Capron FRACTURE FIXATION, POSSIBLE LIGAMENT REPAIR;  Surgeon: Nita Sells, MD;  Location: [place];  Service: Orthopedics;  Laterality: Right;  Right elbow coranoid fracture fixation, possible ligament repair   TEE WITHOUT CARDIOVERSION N/A 01/18/2018   Procedure: TRANSESOPHAGEAL ECHOCARDIOGRAM (TEE);  Surgeon: Larey Dresser, MD;  Location: [facility] ENDOSCOPY;  Service: Cardiovascular;  Laterality: N/A;   TEE WITHOUT CARDIOVERSION N/A 04/18/2021   Procedure: TRANSESOPHAGEAL ECHOCARDIOGRAM (TEE);  Surgeon: Acie Fredrickson Wonda Cheng, MD;  Location: [place];  Service: Cardiovascular;  Laterality: N/A;   WISDOM TOOTH EXTRACTION  Family History  Problem Relation Age of Onset   Hypertension Paternal Uncle    Hypertension Maternal Grandmother    Hypertension Paternal Uncle    Hypertension Paternal Uncle    Hypertension Paternal Uncle    Hypertension Paternal  Uncle    Hypertension Mother    Stroke Father    Heart disease Father    Heart attack Father    Hypertension Father    Colon cancer Neg Hx     Social History   Tobacco Use   Smoking status: Every Day    Types: Cigars    Last attempt to quit: 02/03/2016    Years since quitting: 5.4   Smokeless tobacco: Never  Vaping Use   Vaping Use: Never used  Substance Use Topics   Alcohol use: Yes    Alcohol/week: 0.0 standard drinks    Comment: occ   Drug use: No    Home Medications Prior to Admission medications   Medication Sig Start Date End Date Taking? Authorizing Provider  acetaminophen (TYLENOL) 325 MG tablet Take 2 tablets (650 mg total) by mouth every 4 (four) hours as needed for headache or mild pain. 04/18/21   Margie Billet, NP  allopurinol (ZYLOPRIM) 100 MG tablet TAKE 1 TABLET BY MOUTH ONCE DAILY FOR GOUT 07/25/21   Skeet Latch, MD  colchicine 0.6 MG tablet Take 1 tablet (0.6 mg total) by mouth daily as needed (gout flare). 05/05/21   Loel Dubonnet, NP  dapagliflozin propanediol (FARXIGA) 10 MG TABS tablet Take 1 tablet (10 mg total) by mouth daily. 06/20/21   Skeet Latch, MD  diltiazem (CARTIA XT) 300 MG 24 hr capsule Take 1 capsule (300 mg total) by mouth daily. 07/25/21   Skeet Latch, MD  flecainide (TAMBOCOR) 100 MG tablet Take 1 tablet (100 mg total) by mouth 2 (two) times daily. 06/30/21   Camnitz, Ocie Doyne, MD  furosemide (LASIX) 40 MG tablet Take 1 tablet by mouth twice daily 07/11/21   Camnitz, Ocie Doyne, MD  pantoprazole (PROTONIX) 40 MG tablet Take 1 tablet (40 mg total) by mouth daily. 08/11/19   Fulp, Cammie, MD  traZODone (DESYREL) 50 MG tablet Take 0.5-1 tablets (25-50 mg total) by mouth at bedtime as needed for sleep. 05/04/20   Nicolette Bang, MD  XARELTO 20 MG TABS tablet Take 1 tablet (20 mg total) by mouth daily with supper. 07/25/21   Skeet Latch, MD    Allergies    Patient has no known allergies.  Review of Systems    Review of Systems  Constitutional:  Positive for diaphoresis. Negative for chills and fever.  HENT:  Negative for ear pain and sore throat.   Eyes:  Negative for pain and visual disturbance.  Respiratory:  Negative for cough and shortness of breath.   Cardiovascular:  Positive for palpitations. Negative for chest pain.  Gastrointestinal:  Negative for abdominal pain and vomiting.  Genitourinary:  Negative for dysuria and hematuria.  Musculoskeletal:  Negative for arthralgias and back pain.  Skin:  Negative for color change and rash.  Neurological:  Negative for seizures and syncope.  All other systems reviewed and are negative.  Physical Exam Updated Vital Signs BP 124/74   Pulse 93   Temp 98.5 F (36.9 C) (Oral)   Resp (!) 22   SpO2 100%   Physical Exam Vitals and nursing note reviewed.  Constitutional:      General: He is not in acute distress.    Appearance: He is well-developed. He is  ill-appearing and diaphoretic.  HENT:     Head: Normocephalic and atraumatic.  Eyes:     Conjunctiva/sclera: Conjunctivae normal.  Cardiovascular:     Rate and Rhythm: Tachycardia present. Rhythm irregular.     Heart sounds: No murmur heard. Pulmonary:     Effort: Pulmonary effort is normal. No respiratory distress.     Breath sounds: Normal breath sounds.  Abdominal:     Palpations: Abdomen is soft.     Tenderness: There is no abdominal tenderness.  Musculoskeletal:        General: No swelling.     Cervical back: Neck supple.  Skin:    General: Skin is warm.     Capillary Refill: Capillary refill takes less than 2 seconds.  Neurological:     Mental Status: He is alert.  Psychiatric:        Mood and Affect: Mood normal.    ED Results / Procedures / Treatments   Labs (all labs ordered are listed, but only abnormal results are displayed) Labs Reviewed  RESP PANEL BY RT-PCR (FLU A&B, COVID) ARPGX2  BASIC METABOLIC PANEL  MAGNESIUM  RAPID URINE DRUG SCREEN, HOSP PERFORMED   BASIC METABOLIC PANEL  MAGNESIUM  TROPONIN I (HIGH SENSITIVITY)    EKG None  Radiology DG Chest 2 View  Result Date: 08/02/2021 CLINICAL DATA:  Possible pneumonia. EXAM: CHEST - 2 VIEW COMPARISON:  April 22, 2021. FINDINGS: The heart size and mediastinal contours are within normal limits. Both lungs are clear. The visualized skeletal structures are unremarkable. IMPRESSION: No active cardiopulmonary disease. Electronically Signed   By: Marijo Conception M.D.   On: 08/02/2021 16:55    Procedures .Critical Care Performed by: Teressa Lower, MD Authorized by: Teressa Lower, MD   Critical care provider statement:    Critical care time (minutes):  40   Critical care was necessary to treat or prevent imminent or life-threatening deterioration of the following conditions:  Cardiac failure   Critical care was time spent personally by me on the following activities:  Development of treatment plan with patient or surrogate, discussions with consultants, evaluation of patient's response to treatment, examination of patient, ordering and review of laboratory studies, ordering and review of radiographic studies, ordering and performing treatments and interventions, pulse oximetry, re-evaluation of patient's condition and review of old charts .Sedation  Date/Time: 08/02/2021 5:32 PM Performed by: Teressa Lower, MD Authorized by: Teressa Lower, MD   Consent:    Consent obtained:  Written   Risks discussed:  Allergic reaction, prolonged hypoxia resulting in organ damage, dysrhythmia, prolonged sedation necessitating reversal, inadequate sedation and vomiting   Alternatives discussed:  Anxiolysis Universal protocol:    Immediately prior to procedure, a time out was called: yes   Pre-sedation assessment:    Time since last food or drink:  0800   ASA classification: class 2 - patient with mild systemic disease     Mallampati score:  I - soft palate, uvula, fauces, pillars visible    Pre-sedation assessments completed and reviewed: airway patency, cardiovascular function, mental status, nausea/vomiting and respiratory function   Procedure details (see MAR for exact dosages):    Preoxygenation:  Nasal cannula   Sedation:  Propofol   Intended level of sedation: deep   Intra-procedure monitoring:  Continuous capnometry, blood pressure monitoring, cardiac monitor and continuous pulse oximetry   Intra-procedure events: none     Total Provider sedation time (minutes):  25 Post-procedure details:    Post-sedation assessments completed and reviewed: airway patency,  cardiovascular function, mental status, nausea/vomiting, respiratory function and temperature     Patient is stable for discharge or admission: yes     Procedure completion:  Tolerated well, no immediate complications .Cardioversion  Date/Time: 08/02/2021 5:33 PM Performed by: Teressa Lower, MD Authorized by: Teressa Lower, MD   Consent:    Consent obtained:  Written   Consent given by:  Patient   Risks discussed:  Cutaneous burn, death, induced arrhythmia and pain   Alternatives discussed:  Rate-control medication Attempt one:    Cardioversion mode:  Synchronous   Waveform:  Monophasic   Shock (Joules):  200   Shock outcome:  Conversion to normal sinus rhythm Post-procedure details:    Patient status:  Awake   Patient tolerance of procedure:  Tolerated well, no immediate complications   Medications Ordered in ED Medications  etomidate (AMIDATE) injection 10 mg (10 mg Intravenous Not Given 08/02/21 1609)  propofol (DIPRIVAN) 10 mg/mL bolus/IV push (20 mg Intravenous Given 08/02/21 1556)  acetaminophen (TYLENOL) tablet 650 mg (has no administration in time range)  ondansetron (ZOFRAN) injection 4 mg (has no administration in time range)  diltiazem (CARDIZEM) injection 10 mg (10 mg Intravenous Given 08/02/21 1547)  propofol (DIPRIVAN) 10 mg/mL bolus/IV push (30 mg Intravenous Given 08/02/21 1555)   propofol (DIPRIVAN) 500 MG/50ML infusion (0 mcg/kg/min Intravenous Stopped 08/02/21 1604)  fentaNYL (SUBLIMAZE) injection 25 mcg (25 mcg Intravenous Given 08/02/21 1659)    ED Course  I have reviewed the triage vital signs and the nursing notes.  Pertinent labs & imaging results that were available during my care of the patient were reviewed by me and considered in my medical decision making (see chart for details).    MDM Rules/Calculators/A&P                           Patient seen emergency department for evaluation of tachycardia and diaphoresis.  Physical exam reveals an ill-appearing patient with a significant tachycardia with ventricular rates in the 240s and active diaphoresis.  He did not have complaints of chest pain or shortness of breath.  Initial ECG with what appears to be a flutter with one-to-one block, rate dependent bundle and ST changes.  While preparing to perform a synchronized cardioversion patient received 10 mg of diltiazem with minimal effect.  Flu recitation was begun and the patient was sedated using propofol and a synchronized cardioversion at 200 J was performed to which led to conversion to normal sinus rhythm.  Laboratory evaluation is currently pending.  He with no pneumonia.  Cardiology evaluated the patient at bedside and will admit the patient for transition to [place]. Final Clinical Impression(s) / ED Diagnoses Final diagnoses:  Atrial fibrillation with rapid ventricular response (HCC)  Atrial flutter, unspecified type [facility])    Rx / DC Orders ED Discharge Orders     None        Sarim Rothman, Debe Coder, MD 08/02/21 1735

## 2021-08-02 NOTE — Progress Notes (Addendum)
Pt is afib clinic for not feeling well for last month. EKG shows atrial flutter 1 to 1  ratio at 236 bpm. He is on xarelto and flecainide 100 mg bid, started one month ago. S/p 2 previous ablations.  He is diaphoretic  and no BP was palpable. To ER via w/c with RN.

## 2021-08-02 NOTE — Progress Notes (Signed)
Pharmacy Review for Dofetilide (Tikosyn) Initiation  Admit Complaint: 45 y.o. male admitted 08/02/2021 with atrial fibrillation to be initiated on dofetilide.   Assessment:  Patient Exclusion Criteria: If any screening criteria checked as "Yes", then  patient  should NOT receive dofetilide until criteria item is corrected. If "Yes" please indicate correction plan.  YES  NO Patient  Exclusion Criteria Correction Plan  []  [x]  Baseline QTc interval is greater than or equal to 440 msec. IF above YES box checked dofetilide contraindicated unless patient has ICD; then may proceed if QTc 500-550 msec or with known ventricular conduction abnormalities may proceed with QTc 550-600 msec. QTc =     []  [x]  Magnesium level is less than 1.8 mEq/l : Last magnesium: 1.9        [x]  []  Potassium level is less than 4 mEq/l : Last potassium: 3.7        []  [x]  Patient is known or suspected to have a digoxin level greater than 2 ng/ml: Lab Results  Component Value Date   DIGOXIN 0.2 (L) 04/16/2021      []  [x]  Creatinine clearance less than 20 ml/min (calculated using Cockcroft-Gault, actual body weight and serum creatinine): CrCl cannot be calculated (Patient's most recent lab result is older than the maximum 21 days allowed.).    []  [x]  Patient has received drugs known to prolong the QT intervals within the last 48 hours (phenothiazines, tricyclics or tetracyclic antidepressants, erythromycin, H-1 antihistamines, cisapride, fluoroquinolones, azithromycin). Drugs not listed above may have an, as yet, undetected potential to prolong the QT interval, updated information on QT prolonging agents is available at this website:QT prolonging agents   []  [x]  Patient received a dose of hydrochlorothiazide (Oretic) alone or in any combination including triamterene (Dyazide, Maxzide) in the last 48 hours.   []  [x]  Patient received a medication known to increase dofetilide plasma concentrations prior to initial  dofetilide dose:  Trimethoprim (Primsol, Proloprim) in the last 36 hours Verapamil (Calan, Verelan) in the last 36 hours or a sustained release dose in the last 72 hours Megestrol (Megace) in the last 5 days  Cimetidine (Tagamet) in the last 6 hours Ketoconazole (Nizoral) in the last 24 hours Itraconazole (Sporanox) in the last 48 hours  Prochlorperazine (Compazine) in the last 36 hours    []  [x]  Patient is known to have a history of torsades de pointes; congenital or acquired long QT syndromes.   [x]  []  Patient has received a Class 1 antiarrhythmic with less than 2 half-lives since last dose. (Disopyramide, Quinidine, Procainamide, Lidocaine, Mexiletine, Flecainide, Propafenone)   []  [x]  Patient has received amiodarone therapy in the past 3 months or amiodarone level is greater than 0.3 ng/ml.    Patient has been appropriately anticoagulated with Xarelto.  Ordering provider was confirmed at [email or web address] if they are not listed on the [place] Prescribers list.  Goal of Therapy: Follow renal function, electrolytes, potential drug interactions, and dose adjustment. Provide education and 1 week supply at discharge.  Plan:  -Planning for Tikosyn start on 11/30 following Flecainide washout period per EP note -Replace K/Mag -pharmacy to follow  Lorelei Pont, PharmD, BCPS 08/02/2021 5:06 PM ED Clinical Pharmacist -  [phone]

## 2021-08-02 NOTE — ED Triage Notes (Signed)
Pt came in from an Afib clinic d/t HR in the 230s. Pt appears diaphoretic.

## 2021-08-03 ENCOUNTER — Encounter (HOSPITAL_COMMUNITY): Payer: Self-pay | Admitting: Internal Medicine

## 2021-08-03 ENCOUNTER — Other Ambulatory Visit (HOSPITAL_COMMUNITY): Payer: Self-pay

## 2021-08-03 DIAGNOSIS — F17290 Nicotine dependence, other tobacco product, uncomplicated: Secondary | ICD-10-CM | POA: Diagnosis not present

## 2021-08-03 DIAGNOSIS — I248 Other forms of acute ischemic heart disease: Secondary | ICD-10-CM | POA: Diagnosis not present

## 2021-08-03 DIAGNOSIS — I4892 Unspecified atrial flutter: Secondary | ICD-10-CM

## 2021-08-03 DIAGNOSIS — E876 Hypokalemia: Secondary | ICD-10-CM | POA: Diagnosis not present

## 2021-08-03 DIAGNOSIS — I472 Ventricular tachycardia, unspecified: Secondary | ICD-10-CM | POA: Diagnosis not present

## 2021-08-03 DIAGNOSIS — E669 Obesity, unspecified: Secondary | ICD-10-CM | POA: Diagnosis not present

## 2021-08-03 DIAGNOSIS — G4733 Obstructive sleep apnea (adult) (pediatric): Secondary | ICD-10-CM | POA: Diagnosis not present

## 2021-08-03 DIAGNOSIS — Z20822 Contact with and (suspected) exposure to covid-19: Secondary | ICD-10-CM | POA: Diagnosis not present

## 2021-08-03 DIAGNOSIS — Z6839 Body mass index (BMI) 39.0-39.9, adult: Secondary | ICD-10-CM | POA: Diagnosis not present

## 2021-08-03 DIAGNOSIS — I484 Atypical atrial flutter: Secondary | ICD-10-CM | POA: Diagnosis not present

## 2021-08-03 DIAGNOSIS — M109 Gout, unspecified: Secondary | ICD-10-CM | POA: Diagnosis not present

## 2021-08-03 DIAGNOSIS — I10 Essential (primary) hypertension: Secondary | ICD-10-CM | POA: Diagnosis not present

## 2021-08-03 LAB — BASIC METABOLIC PANEL
Anion gap: 11 (ref 5–15)
Anion gap: 10 (ref 5–15)
BUN: 13 mg/dL (ref 6–20)
BUN: 15 mg/dL (ref 6–20)
CO2: 23 mmol/L (ref 22–32)
CO2: 22 mmol/L (ref 22–32)
Calcium: 8.3 mg/dL — ABNORMAL LOW (ref 8.9–10.3)
Calcium: 8.5 mg/dL — ABNORMAL LOW (ref 8.9–10.3)
Chloride: 102 mmol/L (ref 98–111)
Chloride: 107 mmol/L (ref 98–111)
Creatinine, Ser: 1.18 mg/dL (ref 0.61–1.24)
Creatinine, Ser: 1.05 mg/dL (ref 0.61–1.24)
GFR, Estimated: >60 mL/min (ref >60)
GFR, Estimated: >60 mL/min (ref >60)
Glucose, Bld: 148 mg/dL — ABNORMAL HIGH (ref 70–99)
Glucose, Bld: 126 mg/dL — ABNORMAL HIGH (ref 70–99)
Potassium: 3.2 mmol/L — ABNORMAL LOW (ref 3.5–5.1)
Potassium: 4.3 mmol/L (ref 3.5–5.1)
Sodium: 136 mmol/L (ref 135–145)
Sodium: 139 mmol/L (ref 135–145)

## 2021-08-03 LAB — MAGNESIUM: Magnesium: 2.2 mg/dL (ref 1.7–2.4)

## 2021-08-03 MED ORDER — RIVAROXABAN 20 MG PO TABS
20.0000 mg | ORAL_TABLET | Freq: Every day | ORAL | Status: DC
  Administered 2021-08-03 – 2021-08-05 (×3): 20 mg via ORAL
  Filled 2021-08-03 (×3): qty 1

## 2021-08-03 MED ORDER — POTASSIUM CHLORIDE CRYS ER 20 MEQ PO TBCR
40.0000 meq | EXTENDED_RELEASE_TABLET | Freq: Once | ORAL | Status: AC
  Administered 2021-08-03: 40 meq via ORAL
  Filled 2021-08-03: qty 2

## 2021-08-03 MED ORDER — DAPAGLIFLOZIN PROPANEDIOL 10 MG PO TABS
10.0000 mg | ORAL_TABLET | Freq: Every day | ORAL | Status: DC
  Administered 2021-08-03 – 2021-08-06 (×4): 10 mg via ORAL
  Filled 2021-08-03 (×5): qty 1

## 2021-08-03 MED ORDER — POTASSIUM CHLORIDE CRYS ER 20 MEQ PO TBCR: 60.0000 meq | EXTENDED_RELEASE_TABLET | Freq: Once | ORAL | Status: DC

## 2021-08-03 MED ORDER — COLCHICINE 0.6 MG PO TABS
0.6000 mg | ORAL_TABLET | Freq: Every day | ORAL | Status: DC | PRN
  Administered 2021-08-03: 0.6 mg via ORAL
  Filled 2021-08-03 (×2): qty 1

## 2021-08-03 MED ORDER — FUROSEMIDE 40 MG PO TABS
40.0000 mg | ORAL_TABLET | Freq: Two times a day (BID) | ORAL | Status: DC
  Administered 2021-08-03 – 2021-08-06 (×6): 40 mg via ORAL
  Filled 2021-08-03 (×6): qty 1

## 2021-08-03 MED ORDER — ACETAMINOPHEN 325 MG PO TABS: 650.0000 mg | ORAL_TABLET | ORAL | Status: DC | PRN

## 2021-08-03 MED ORDER — TRAZODONE HCL 50 MG PO TABS: 25.0000 mg | ORAL_TABLET | Freq: Every evening | ORAL | Status: DC | PRN

## 2021-08-03 MED ORDER — ALLOPURINOL 100 MG PO TABS
100.0000 mg | ORAL_TABLET | Freq: Every day | ORAL | Status: DC
  Administered 2021-08-03 – 2021-08-06 (×4): 100 mg via ORAL
  Filled 2021-08-03 (×4): qty 1

## 2021-08-03 MED ORDER — DILTIAZEM HCL ER COATED BEADS 180 MG PO CP24
300.0000 mg | ORAL_CAPSULE | Freq: Every day | ORAL | Status: DC
  Administered 2021-08-03: 300 mg via ORAL
  Filled 2021-08-03: qty 1

## 2021-08-03 MED ORDER — PANTOPRAZOLE SODIUM 40 MG PO TBEC
40.0000 mg | DELAYED_RELEASE_TABLET | Freq: Every day | ORAL | Status: DC
  Administered 2021-08-03 – 2021-08-06 (×4): 40 mg via ORAL
  Filled 2021-08-03 (×4): qty 1

## 2021-08-03 MED ORDER — DOFETILIDE 500 MCG PO CAPS
500.0000 ug | ORAL_CAPSULE | Freq: Two times a day (BID) | ORAL | Status: DC
  Administered 2021-08-03 – 2021-08-06 (×6): 500 ug via ORAL
  Filled 2021-08-03 (×7): qty 1

## 2021-08-03 NOTE — ED Notes (Signed)
Breakfast order placed ?

## 2021-08-03 NOTE — TOC Benefit Eligibility Note (Signed)
Patient Teacher, English as a foreign language completed.    The patient is currently admitted and upon discharge could be taking dofetilide (Tikosyn) 500 mcg.  The current 30 day co-pay is, $0.00.   The patient is insured through [employer] Medicaid     Lyndel Safe, [place] Patient Advocate Specialist Black Patient Advocate Team Direct Number: [phone]  Fax: [phone]

## 2021-08-03 NOTE — Plan of Care (Signed)
  Problem: Health Behavior/Discharge Planning: Goal: Ability to manage health-related needs will improve Outcome: Progressing   Problem: Clinical Measurements: Goal: Ability to maintain clinical measurements within normal limits will improve Outcome: Progressing Goal: Will remain free from infection Outcome: Progressing Goal: Diagnostic test results will improve Outcome: Progressing Goal: Cardiovascular complication will be avoided Outcome: Progressing   Problem: Activity: Goal: Risk for activity intolerance will decrease Outcome: Progressing   Problem: Nutrition: Goal: Adequate nutrition will be maintained Outcome: Progressing   Problem: Coping: Goal: Level of anxiety will decrease Outcome: Progressing   Problem: Elimination: Goal: Will not experience complications related to bowel motility Outcome: Progressing Goal: Will not experience complications related to urinary retention Outcome: Progressing   Problem: Pain Managment: Goal: General experience of comfort will improve Outcome: Progressing

## 2021-08-03 NOTE — ED Notes (Signed)
Patient is resting comfortably. Pt states he does not need anything from RN at this time

## 2021-08-03 NOTE — ED Notes (Signed)
Patient is resting comfortably. 

## 2021-08-03 NOTE — Progress Notes (Addendum)
Pharmacy: Dofetilide (Tikosyn) - Follow Up Assessment and Electrolyte Replacement  Pharmacy consulted to assist in monitoring and replacing electrolytes in this 45 y.o. male admitted on 08/02/2021 undergoing dofetilide initiation. First dofetilide dose: planned for 11/30 AM.  Labs:    Component Value Date/Time   K 3.2 (L) 08/03/2021 0345   MG 2.2 08/03/2021 0345     Plan: Potassium: K < 3.5:  Give KCl 40 mEq q4hr x2   Magnesium: Mg > 2: No additional supplementation needed   Thank you for allowing pharmacy to participate in this patient's care   Antonietta Jewel, PharmD, [place] Pharmacist  Phone: [phone] 08/03/2021 7:20 AM  Please check AMION for all [place] phone numbers After 10:00 PM, call [place] [phone]  ADDENDUM  Latest Reference Range & Units 08/03/21 12:50  Potassium 3.5 - 5.1 mmol/L 4.3   K came back in goal range after replacement - okay to proceed with tikosyn initiation tonight.   Antonietta Jewel, PharmD, [place] Clinical Pharmacist

## 2021-08-03 NOTE — Progress Notes (Addendum)
Electrophysiology Rounding Note  Patient Name: Samuel Flynn Date of Encounter: 08/03/2021  Primary Cardiologist: Skeet Latch, MD Electrophysiologist: Aleph Nickson Meredith Leeds, MD   Subjective   The patient is doing well today.  At this time, the patient denies chest pain, shortness of breath, or any new concerns.  Inpatient Medications    Scheduled Meds:  etomidate  10 mg Intravenous Once   potassium chloride  40 mEq Oral Once   Continuous Infusions:  PRN Meds: acetaminophen, ondansetron (ZOFRAN) IV, propofol   Vital Signs    Vitals:   08/03/21 0500 08/03/21 0530 08/03/21 0600 08/03/21 0630  BP: 118/76 (!) 138/91 137/89 (!) 133/93  Pulse: 60 64 (!) 59 (!) 56  Resp: 16 20 14 16   Temp:      TempSrc:      SpO2: 95% 98% 96% 98%   No intake or output data in the 24 hours ending 08/03/21 0845 There were no vitals filed for this visit.  Physical Exam    GEN- The patient is well appearing, alert and oriented x 3 today.   Head- normocephalic, atraumatic Eyes-  Sclera clear, conjunctiva pink Ears- hearing intact Oropharynx- clear Neck- supple Lungs- Clear to ausculation bilaterally, normal work of breathing Heart- Regular rate and rhythm, no murmurs, rubs or gallops GI- soft, NT, ND, + BS Extremities- no clubbing or cyanosis. No edema Skin- no rash or lesion Psych- euthymic mood, full affect Neuro- strength and sensation are intact  Labs    CBC No results for input(s): WBC, NEUTROABS, HGB, HCT, MCV, PLT in the last 72 hours. Basic Metabolic Panel Recent Labs    08/02/21 1602 08/03/21 0345  NA 140 136  K 3.7 3.2*  CL 103 102  CO2 24 23  GLUCOSE 145* 148*  BUN 13 13  CREATININE 1.33* 1.18  CALCIUM 9.2 8.3*  MG 1.9 2.2   Liver Function Tests No results for input(s): AST, ALT, ALKPHOS, BILITOT, PROT, ALBUMIN in the last 72 hours. No results for input(s): LIPASE, AMYLASE in the last 72 hours. Cardiac Enzymes No results for input(s): CKTOTAL,  CKMB, CKMBINDEX, TROPONINI in the last 72 hours.   Telemetry    Sinus Brady/NSR 50-60s (personally reviewed)  Radiology    DG Chest 2 View  Result Date: 08/02/2021 CLINICAL DATA:  Possible pneumonia. EXAM: CHEST - 2 VIEW COMPARISON:  April 22, 2021. FINDINGS: The heart size and mediastinal contours are within normal limits. Both lungs are clear. The visualized skeletal structures are unremarkable. IMPRESSION: No active cardiopulmonary disease. Electronically Signed   By: Marijo Conception M.D.   On: 08/02/2021 16:55    Patient Profile     Samuel Flynn is a 45 y.o. male with a history of persistent atrial fibrillation s/p ablation 04/2018 and 11/2019, paroxysmal atrial flutter, OSA, and CKD who is being seen today for the evaluation of AFL with RVR at the request of Dr. Matilde Sprang.  Assessment & Plan    1.  Paroxysmal atrial flutter with RVR Contnue Xarelto 20 mg daily Continue diltiazem 300 mg daily Echo 04/2021 LVEF 55-60% Do NOT start amiodarone.  Do NOT continue flecainide We Kanin Lia plan to start Tikosyn this evening for load after flecainide wash out.  Tentatively planning ablation in near future pending insurance  K 3.2 this am. Ellissa Ayo supp aggressively and recheck in afternoon per tikosyn protocol.    2. Obesity Lifestyle modification has been advised.   3. OSA Continue CPAP  Earliest d/c with tikosyn load Ginette Bradway be  Saturday afternoon after am dose.     For questions or updates, please contact [place] Please consult [email or web address] for contact info under Cardiology/STEMI.  Signed, Shirley Friar, PA-C  08/03/2021, 8:45 AM   I have seen and examined this patient with Samuel Flynn.  Agree with above, note added to reflect my findings.  Patient remains in sinus rhythm.  He is currently feeling well.  He did have some chest discomfort overnight last night, but that could have certainly been due to anxiety and his issues yesterday.    GEN: Well nourished, well  developed, in no acute distress  HEENT: normal  Neck: no JVD, carotid bruits, or masses Cardiac: RRR; no murmurs, rubs, or gallops,no edema  Respiratory:  clear to auscultation bilaterally, normal work of breathing GI: soft, nontender, nondistended, + BS MS: no deformity or atrophy  Skin: warm and dry Neuro:  Strength and sensation are intact Psych: euthymic mood, full affect   1.  Paroxysmal atrial fibrillation/flutter: Status post ablation x2.  He was previously on flecainide, but this is not been controlling his symptoms.  Due to that, we Tru Rana plan for dofetilide load.  We Inaki Vantine start tonight as flecainide [place]nee Gambone have washed out.  2.  Obesity: Lifestyle modification encouraged  3.  Obstructive sleep apnea: CPAP compliance encouraged  Anthony Roland M. Charlott Calvario MD 08/03/2021 10:59 AM

## 2021-08-03 NOTE — ED Notes (Signed)
Pt given sandwich bag, ginger ale, and ice cup. Pt also continues to complain of pain in chest, aching, and being hot. Room temp is hot to family and staff, attempted to adjust thermostat. Pt given fan. No acute changes noted. Will continue to monitor.

## 2021-08-04 DIAGNOSIS — Z419 Encounter for procedure for purposes other than remedying health state, unspecified: Secondary | ICD-10-CM | POA: Diagnosis not present

## 2021-08-04 DIAGNOSIS — I4892 Unspecified atrial flutter: Secondary | ICD-10-CM | POA: Diagnosis not present

## 2021-08-04 DIAGNOSIS — I472 Ventricular tachycardia, unspecified: Secondary | ICD-10-CM | POA: Diagnosis not present

## 2021-08-04 DIAGNOSIS — I484 Atypical atrial flutter: Secondary | ICD-10-CM | POA: Diagnosis not present

## 2021-08-04 DIAGNOSIS — G4733 Obstructive sleep apnea (adult) (pediatric): Secondary | ICD-10-CM | POA: Diagnosis not present

## 2021-08-04 DIAGNOSIS — M109 Gout, unspecified: Secondary | ICD-10-CM | POA: Diagnosis not present

## 2021-08-04 DIAGNOSIS — I248 Other forms of acute ischemic heart disease: Secondary | ICD-10-CM | POA: Diagnosis not present

## 2021-08-04 DIAGNOSIS — I10 Essential (primary) hypertension: Secondary | ICD-10-CM | POA: Diagnosis not present

## 2021-08-04 DIAGNOSIS — E876 Hypokalemia: Secondary | ICD-10-CM | POA: Diagnosis not present

## 2021-08-04 DIAGNOSIS — F17290 Nicotine dependence, other tobacco product, uncomplicated: Secondary | ICD-10-CM | POA: Diagnosis not present

## 2021-08-04 DIAGNOSIS — Z20822 Contact with and (suspected) exposure to covid-19: Secondary | ICD-10-CM | POA: Diagnosis not present

## 2021-08-04 DIAGNOSIS — E669 Obesity, unspecified: Secondary | ICD-10-CM | POA: Diagnosis not present

## 2021-08-04 DIAGNOSIS — Z6839 Body mass index (BMI) 39.0-39.9, adult: Secondary | ICD-10-CM | POA: Diagnosis not present

## 2021-08-04 LAB — BASIC METABOLIC PANEL WITH GFR
Anion gap: 11 (ref 5–15)
BUN: 12 mg/dL (ref 6–20)
CO2: 24 mmol/L (ref 22–32)
Calcium: 8.6 mg/dL — ABNORMAL LOW (ref 8.9–10.3)
Chloride: 106 mmol/L (ref 98–111)
Creatinine, Ser: 1.2 mg/dL (ref 0.61–1.24)
GFR, Estimated: >60 mL/min
Glucose, Bld: 119 mg/dL — ABNORMAL HIGH (ref 70–99)
Potassium: 4 mmol/L (ref 3.5–5.1)
Sodium: 141 mmol/L (ref 135–145)

## 2021-08-04 LAB — MAGNESIUM: Magnesium: 2 mg/dL (ref 1.7–2.4)

## 2021-08-04 MED ORDER — POTASSIUM CHLORIDE CRYS ER 20 MEQ PO TBCR
20.0000 meq | EXTENDED_RELEASE_TABLET | Freq: Every day | ORAL | Status: DC
  Administered 2021-08-04: 20 meq via ORAL
  Filled 2021-08-04: qty 1

## 2021-08-04 MED ORDER — COLCHICINE 0.6 MG PO TABS
1.2000 mg | ORAL_TABLET | Freq: Once | ORAL | Status: AC
  Administered 2021-08-04: 1.2 mg via ORAL
  Filled 2021-08-04: qty 2

## 2021-08-04 MED ORDER — DILTIAZEM HCL ER COATED BEADS 240 MG PO CP24
240.0000 mg | ORAL_CAPSULE | Freq: Every day | ORAL | Status: DC
  Filled 2021-08-04: qty 1

## 2021-08-04 MED ORDER — MAGNESIUM SULFATE 2 GM/50ML IV SOLN
2.0000 g | Freq: Once | INTRAVENOUS | Status: AC
  Administered 2021-08-04: 2 g via INTRAVENOUS
  Filled 2021-08-04: qty 50

## 2021-08-04 MED ORDER — DILTIAZEM HCL ER COATED BEADS 180 MG PO CP24
180.0000 mg | ORAL_CAPSULE | Freq: Every day | ORAL | Status: DC
  Administered 2021-08-04 – 2021-08-06 (×3): 180 mg via ORAL
  Filled 2021-08-04 (×3): qty 1

## 2021-08-04 NOTE — Progress Notes (Addendum)
Electrophysiology Rounding Note  Patient Name: Samuel Flynn Date of Encounter: 08/04/2021  Primary Cardiologist: Skeet Latch, MD  Electrophysiologist: Constance Haw, MD    Subjective   Pt  remains in NSR  on Tikosyn 500 mcg BID   QTc from EKG last pm shows stable QTc at ~420  The patient is doing well today.  At this time, the patient denies chest pain, shortness of breath, or any new concerns.  Inpatient Medications    Scheduled Meds:  allopurinol  100 mg Oral Daily   dapagliflozin propanediol  10 mg Oral Daily   diltiazem  300 mg Oral Daily   dofetilide  500 mcg Oral BID   furosemide  40 mg Oral BID   pantoprazole  40 mg Oral Daily   rivaroxaban  20 mg Oral Q supper   Continuous Infusions:  PRN Meds: acetaminophen, colchicine, traZODone   Vital Signs    Vitals:   08/03/21 1659 08/03/21 1905 08/04/21 0016 08/04/21 0611  BP: (!) 144/79 139/72 132/83 (!) 129/91  Pulse:   65 (!) 52  Resp:  16 15 16   Temp:  97.8 F (36.6 C) 97.8 F (36.6 C) 97.8 F (36.6 C)  TempSrc:  Oral Oral Oral  SpO2:  96% 98% 98%  Weight:    (!) 141.9 kg  Height:        Intake/Output Summary (Last 24 hours) at 08/04/2021 0711 Last data filed at 08/04/2021 0616 Gross per 24 hour  Intake 259 ml  Output --  Net 259 ml   Filed Weights   08/03/21 1538 08/04/21 0611  Weight: (!) 141.1 kg (!) 141.9 kg    Physical Exam    GEN- The patient is well appearing, alert and oriented x 3 today.   Head- normocephalic, atraumatic Eyes-  Sclera clear, conjunctiva pink Ears- hearing intact Oropharynx- clear Neck- supple Lungs- Clear to ausculation bilaterally, normal work of breathing Heart- Regular rate and rhythm, no murmurs, rubs or gallops GI- soft, NT, ND, + BS Extremities- no clubbing, cyanosis, or edema Skin- no rash or lesion Psych- euthymic mood, full affect Neuro- strength and sensation are intact  Labs    CBC No results for input(s): WBC, NEUTROABS, HGB,  HCT, MCV, PLT in the last 72 hours. Basic Metabolic Panel Recent Labs    08/03/21 0345 08/03/21 1250 08/04/21 0221  NA 136 139 141  K 3.2* 4.3 4.0  CL 102 107 106  CO2 23 22 24   GLUCOSE 148* 126* 119*  BUN 13 15 12   CREATININE 1.18 1.05 1.20  CALCIUM 8.3* 8.5* 8.6*  MG 2.2  --  2.0    Potassium  Date/Time Value Ref Range Status  08/04/2021 02:21 AM 4.0 3.5 - 5.1 mmol/L Final   Magnesium  Date/Time Value Ref Range Status  08/04/2021 02:21 AM 2.0 1.7 - 2.4 mg/dL Final    Comment:    Performed at [facility], [place] [address] [place],  [address]    Telemetry    Sinus bradycardia 40-50s (personally reviewed)  Radiology    DG Chest 2 View  Result Date: 08/02/2021 CLINICAL DATA:  Possible pneumonia. EXAM: CHEST - 2 VIEW COMPARISON:  April 22, 2021. FINDINGS: The heart size and mediastinal contours are within normal limits. Both lungs are clear. The visualized skeletal structures are unremarkable. IMPRESSION: No active cardiopulmonary disease. Electronically Signed   By: Marijo Conception M.D.   On: 08/02/2021 16:55     Patient Profile  Samuel Flynn is a 45 y.o. male with a past medical history significant for persistent atrial fibrillation.  They were admitted for tikosyn load.   Assessment & Plan    Persistent atrial fibrillation Pt  remains in NSR  on Tikosyn 500 mcg BID  Continue Xarelto K 4.0, Mg 2.0. Samuel Flynn gently supp both.  CHA2DS2VASC is at least 3 Samuel Flynn downtitrate diltiazem as needed now on tikosyn.  2. Obesity Body mass index is 39.1 kg/m. Lifestyle modification has been advised.   3. OSA Continue CPAP  Earliest home Samuel Flynn be Saturday afternoon after 6 doses of tikosyn.    For questions or updates, please contact Samuel Flynn Please consult [email or web address] for contact info under Cardiology/STEMI.  Signed, Samuel Friar, PA-C  08/04/2021, 7:11 AM   I have seen and examined this patient with Samuel Flynn.  Agree with  above, note added to reflect my findings.  On exam, RRR, no murmurs, lungs clear, no edema, no JVD. Feeling well without complaint. Feeling well in sinus rhythm. Rested well overnight without issue. Tolerating tikosyn. Samuel Flynn continue with current dose as QTc has remained stable..    Samuel Flynn M. Diyari Cherne MD 08/04/2021 8:42 AM

## 2021-08-04 NOTE — Progress Notes (Signed)
Pt states that he feels like he is having a gout flare up in the left foot. Spoke with [place] and verbal order was given to place order for Colcrys 1.2mg  one time dose tonight. Order placed.

## 2021-08-04 NOTE — Care Management (Signed)
1552 08-04-21 Patient presented for Tikosyn Load. Case Manager spoke with the patient regarding co pay cost. Patient would like to have the initial Rx sent to [facility] and the Rx refills sent to the [facility] and [place]. No further needs identified at this time.

## 2021-08-04 NOTE — Progress Notes (Addendum)
Pharmacy: Dofetilide (Tikosyn) - Follow Up Assessment and Electrolyte Replacement  Pharmacy consulted to assist in monitoring and replacing electrolytes in this 45 y.o. male admitted on 08/02/2021 undergoing dofetilide initiation. First dofetilide dose: 08/03/21 pm  Labs:    Component Value Date/Time   K 4.0 08/04/2021 0221   MG 2.0 08/04/2021 0221     Plan: Potassium: K >/= 4: No additional supplementation needed but will give 20 mEq x1 given BID Lasix  Magnesium: Mg 1.8-2: Give Mg 2 gm IV x1    Thank you for allowing pharmacy to participate in this patient's care   Einar Grad 08/04/2021  7:13 AM

## 2021-08-04 NOTE — Progress Notes (Signed)
Morning EKG reviewed    Shows remains in NSR at 60 bpm with stable QTc at ~460-470 ms.  Continue  Tikosyn 500 mcg BID.   Pt will not require DCCV   Annamaria Helling  Pager: [phone]  08/04/2021 1:54 PM

## 2021-08-04 NOTE — Progress Notes (Signed)
Mobility Specialist Progress Note    08/04/21 1559  Mobility  Activity Ambulated in hall  Level of Assistance Independent  Assistive Device None  Distance Ambulated (ft) 120 ft  Mobility Ambulated independently in hallway  Mobility Response Tolerated well  Mobility performed by Mobility specialist  $Mobility charge 1 Mobility   Pt received in bed and agreeable. C/o gout pain in L foot and had a limp. Returned to bed with call bell in reach and family present.   [facility] Mobility Specialist  M.S. Primary Phone: [phone] M.S. Secondary Phone: [phone]

## 2021-08-05 DIAGNOSIS — Z6839 Body mass index (BMI) 39.0-39.9, adult: Secondary | ICD-10-CM | POA: Diagnosis not present

## 2021-08-05 DIAGNOSIS — I472 Ventricular tachycardia, unspecified: Secondary | ICD-10-CM | POA: Diagnosis not present

## 2021-08-05 DIAGNOSIS — Z20822 Contact with and (suspected) exposure to covid-19: Secondary | ICD-10-CM | POA: Diagnosis not present

## 2021-08-05 DIAGNOSIS — F17290 Nicotine dependence, other tobacco product, uncomplicated: Secondary | ICD-10-CM | POA: Diagnosis not present

## 2021-08-05 DIAGNOSIS — I484 Atypical atrial flutter: Secondary | ICD-10-CM | POA: Diagnosis not present

## 2021-08-05 DIAGNOSIS — E876 Hypokalemia: Secondary | ICD-10-CM | POA: Diagnosis not present

## 2021-08-05 DIAGNOSIS — I248 Other forms of acute ischemic heart disease: Secondary | ICD-10-CM | POA: Diagnosis not present

## 2021-08-05 DIAGNOSIS — I10 Essential (primary) hypertension: Secondary | ICD-10-CM | POA: Diagnosis not present

## 2021-08-05 DIAGNOSIS — G4733 Obstructive sleep apnea (adult) (pediatric): Secondary | ICD-10-CM | POA: Diagnosis not present

## 2021-08-05 DIAGNOSIS — E669 Obesity, unspecified: Secondary | ICD-10-CM | POA: Diagnosis not present

## 2021-08-05 DIAGNOSIS — I4892 Unspecified atrial flutter: Secondary | ICD-10-CM | POA: Diagnosis not present

## 2021-08-05 DIAGNOSIS — M109 Gout, unspecified: Secondary | ICD-10-CM | POA: Diagnosis not present

## 2021-08-05 LAB — BASIC METABOLIC PANEL
Anion gap: 9 (ref 5–15)
Anion gap: 8 (ref 5–15)
BUN: 14 mg/dL (ref 6–20)
BUN: 14 mg/dL (ref 6–20)
CO2: 25 mmol/L (ref 22–32)
CO2: 25 mmol/L (ref 22–32)
Calcium: 8.3 mg/dL — ABNORMAL LOW (ref 8.9–10.3)
Calcium: 8.5 mg/dL — ABNORMAL LOW (ref 8.9–10.3)
Chloride: 105 mmol/L (ref 98–111)
Chloride: 105 mmol/L (ref 98–111)
Creatinine, Ser: 1.27 mg/dL — ABNORMAL HIGH (ref 0.61–1.24)
Creatinine, Ser: 1.13 mg/dL (ref 0.61–1.24)
GFR, Estimated: >60 mL/min (ref >60)
GFR, Estimated: >60 mL/min (ref >60)
Glucose, Bld: 143 mg/dL — ABNORMAL HIGH (ref 70–99)
Glucose, Bld: 119 mg/dL — ABNORMAL HIGH (ref 70–99)
Potassium: 3.2 mmol/L — ABNORMAL LOW (ref 3.5–5.1)
Potassium: 3.9 mmol/L (ref 3.5–5.1)
Sodium: 139 mmol/L (ref 135–145)
Sodium: 138 mmol/L (ref 135–145)

## 2021-08-05 LAB — MAGNESIUM: Magnesium: 2.3 mg/dL (ref 1.7–2.4)

## 2021-08-05 MED ORDER — COLCHICINE 0.6 MG PO TABS
0.6000 mg | ORAL_TABLET | Freq: Once | ORAL | Status: AC
  Administered 2021-08-05: 0.6 mg via ORAL

## 2021-08-05 MED ORDER — POTASSIUM CHLORIDE CRYS ER 20 MEQ PO TBCR
40.0000 meq | EXTENDED_RELEASE_TABLET | ORAL | Status: AC
  Administered 2021-08-05: 40 meq via ORAL
  Filled 2021-08-05: qty 2

## 2021-08-05 MED ORDER — COLCHICINE 0.6 MG PO TABS
0.6000 mg | ORAL_TABLET | Freq: Every day | ORAL | Status: DC
  Administered 2021-08-05 – 2021-08-06 (×2): 0.6 mg via ORAL
  Filled 2021-08-05 (×3): qty 1

## 2021-08-05 MED ORDER — POTASSIUM CHLORIDE CRYS ER 20 MEQ PO TBCR
40.0000 meq | EXTENDED_RELEASE_TABLET | Freq: Once | ORAL | Status: AC
  Administered 2021-08-05: 40 meq via ORAL
  Filled 2021-08-05: qty 2

## 2021-08-05 MED ORDER — POTASSIUM CHLORIDE CRYS ER 20 MEQ PO TBCR
40.0000 meq | EXTENDED_RELEASE_TABLET | Freq: Every day | ORAL | Status: DC
  Administered 2021-08-05 – 2021-08-06 (×2): 40 meq via ORAL
  Filled 2021-08-05 (×2): qty 2

## 2021-08-05 MED ORDER — IBUPROFEN 200 MG PO TABS
400.0000 mg | ORAL_TABLET | Freq: Once | ORAL | Status: AC
  Administered 2021-08-05: 400 mg via ORAL
  Filled 2021-08-05: qty 2

## 2021-08-05 MED ORDER — POTASSIUM CHLORIDE CRYS ER 20 MEQ PO TBCR: 40.0000 meq | EXTENDED_RELEASE_TABLET | Freq: Every day | ORAL | Status: DC

## 2021-08-05 NOTE — Progress Notes (Signed)
Notified Ignacia Bayley NP pt cont to have pain from gout flare. Colchicine 0.6mg  x 1 ordered. Carroll Kinds RN

## 2021-08-05 NOTE — Progress Notes (Addendum)
   Called local pharmacies to confirm stock of Tikosyn for weekend discharge.  [facility] [address], [place], [place] [address] Phone [phone] Has Tikosyn 500 mcg  Tikosyn 250 mcg  Both in stock with goodRx price of $50 (Unsure what would be through patients insurance)  Discussed with patient who verbalizes understanding of location and price.   Pt may be able to get them from [facility] moving forward, but they are not open on Saturdays/Sundays for discharge.   Legrand Como [address] [place], PA-C  08/05/2021 9:39 AM

## 2021-08-05 NOTE — Progress Notes (Addendum)
Electrophysiology Rounding Note  Patient Name: Samuel Flynn Date of Encounter: 08/05/2021  Primary Cardiologist: Skeet Latch, MD  Electrophysiologist: Constance Haw, MD    Subjective   Pt  remains in NSR  on Tikosyn 500 mcg BID   QTc from EKG last pm shows stable QTc at ~450  The patient is doing well today.  He does feel like he is having a gout flare and colchicine started last night.  Inpatient Medications    Scheduled Meds:  allopurinol  100 mg Oral Daily   dapagliflozin propanediol  10 mg Oral Daily   diltiazem  180 mg Oral Daily   dofetilide  500 mcg Oral BID   furosemide  40 mg Oral BID   pantoprazole  40 mg Oral Daily   potassium chloride  40 mEq Oral STAT   potassium chloride  40 mEq Oral Daily   rivaroxaban  20 mg Oral Q supper   Continuous Infusions:  PRN Meds: acetaminophen, colchicine   Vital Signs    Vitals:   08/04/21 1030 08/04/21 1413 08/04/21 2053 08/05/21 0606  BP: 139/88 (!) 144/89 (!) 155/78 (!) 158/82  Pulse:  60 65 (!) 51  Resp:  16 17 19   Temp:   99.3 F (37.4 C) 99 F (37.2 C)  TempSrc:   Oral Oral  SpO2:  99% 100% 100%  Weight:    (!) 140.2 kg  Height:        Intake/Output Summary (Last 24 hours) at 08/05/2021 0719 Last data filed at 08/04/2021 2000 Gross per 24 hour  Intake 720 ml  Output --  Net 720 ml   Filed Weights   08/03/21 1538 08/04/21 0611 08/05/21 0606  Weight: (!) 141.1 kg (!) 141.9 kg (!) 140.2 kg    Physical Exam    GEN- The patient is well appearing, alert and oriented x 3 today.   Head- normocephalic, atraumatic Eyes-  Sclera clear, conjunctiva pink Ears- hearing intact Oropharynx- clear Neck- supple Lungs- Clear to ausculation bilaterally, normal work of breathing Heart- Regular rate and rhythm, no murmurs, rubs or gallops GI- soft, NT, ND, + BS Extremities- no clubbing, cyanosis, or edema Skin- no rash or lesion Psych- euthymic mood, full affect Neuro- strength and sensation are  intact  Labs    CBC No results for input(s): WBC, NEUTROABS, HGB, HCT, MCV, PLT in the last 72 hours. Basic Metabolic Panel Recent Labs    08/04/21 0221 08/05/21 0138  NA 141 139  K 4.0 3.2*  CL 106 105  CO2 24 25  GLUCOSE 119* 143*  BUN 12 14  CREATININE 1.20 1.27*  CALCIUM 8.6* 8.3*  MG 2.0 2.3    Potassium  Date/Time Value Ref Range Status  08/05/2021 01:38 AM 3.2 (L) 3.5 - 5.1 mmol/L Final   Magnesium  Date/Time Value Ref Range Status  08/05/2021 01:38 AM 2.3 1.7 - 2.4 mg/dL Final    Comment:    Performed at [facility], [place] [address], [place], [place] [address]    Telemetry    Sinus brady 40-50s, one 3 beat NSVT (personally reviewed)  Radiology    No results found.   Patient Profile     Samuel Flynn is a 45 y.o. male with a past medical history significant for persistent atrial fibrillation.  They were admitted for tikosyn load.   Assessment & Plan    Persistent atrial fibrillation Pt  remains in NSR/sinus brady  on Tikosyn 500 mcg BID  Continue  Xarelto K 3.2 Supp with 40 meq now and 40 meq in 1 hr. Recheck BMET early noon. Mg 2.3 CHA2DS2VASC is at least 3  2. Obesity Lifestyle modification encouraged  3. OSA Encouraged CPAP use  4. Gout flare Continue home colchicine after 1.2 mg dose last night.  Daily until flare resolves.  Plan for home tomorrow if QTc remains stable.  For questions or updates, please contact [place] Please consult [email or web address] for contact info under Cardiology/STEMI.  Signed, Shirley Friar, PA-C  08/05/2021, 7:19 AM   I have seen and examined this patient with [name].  Agree with above, note added to reflect my findings.  On exam, RRR, no murmurs, lungs clear, no LE edema, no JVD. Patient did well yesterday. No issues. Had 3 beats NSVT though not close to T wave on telemetry. QTc remains stable. Continue with current management. Samuel Rittenhouse replete K.    Ky Moskowitz M. Buster Schueller  MD 08/05/2021 7:51 AM

## 2021-08-05 NOTE — Progress Notes (Signed)
Morning EKG reviewed    Shows remains in NSR at 61 bpm with stable QTc at 440-450 ms.  Continue  Tikosyn 500 mcg BID.   Plan for home tomorrow if QTc remains stable  See note regarding Pharmacy location.    Shirley Friar, PA-C  Pager: [phone]  08/05/2021 11:09 AM

## 2021-08-05 NOTE — Progress Notes (Addendum)
Pharmacy: Dofetilide (Tikosyn) - Follow Up Assessment and Electrolyte Replacement  Pharmacy consulted to assist in monitoring and replacing electrolytes in this 45 y.o. male admitted on 08/02/2021 undergoing dofetilide initiation. First dofetilide dose: 08/03/21 pm  Labs:    Component Value Date/Time   K 3.2 (L) 08/05/2021 0138   MG 2.3 08/05/2021 0138     Plan: Potassium: K < 3.5:  Give KCl 40 mEq q4hr x2    Magnesium: Mg > 2: No additional supplementation needed  As patient has required on average 45 mEq of potassium replacement every day, recommend discharging patient with prescription for:  Potassium chloride 40 mEq  daily  Thank you for allowing pharmacy to participate in this patient's care   Antonietta Jewel, PharmD, [place] Pharmacist  Phone: [phone] 08/05/2021 7:13 AM  Please check AMION for all [place] phone numbers After 10:00 PM, call [place] [phone]  ADDENDUM  Latest Reference Range & Units 08/05/21 11:18  Potassium 3.5 - 5.1 mmol/L 3.9   Will order another 40 mEq once today prior to evening dose.   Antonietta Jewel, PharmD, [place] [facility] Clinical Pharmacist  Phone: [phone] 08/05/2021 2:18 PM  Please check AMION for all [place] phone numbers After 10:00 PM, call [place] [phone]

## 2021-08-06 ENCOUNTER — Other Ambulatory Visit: Payer: Self-pay | Admitting: Student

## 2021-08-06 DIAGNOSIS — G4733 Obstructive sleep apnea (adult) (pediatric): Secondary | ICD-10-CM | POA: Diagnosis not present

## 2021-08-06 DIAGNOSIS — I4892 Unspecified atrial flutter: Secondary | ICD-10-CM | POA: Diagnosis not present

## 2021-08-06 DIAGNOSIS — F17290 Nicotine dependence, other tobacco product, uncomplicated: Secondary | ICD-10-CM | POA: Diagnosis not present

## 2021-08-06 DIAGNOSIS — M109 Gout, unspecified: Secondary | ICD-10-CM | POA: Diagnosis not present

## 2021-08-06 DIAGNOSIS — I4819 Other persistent atrial fibrillation: Secondary | ICD-10-CM | POA: Diagnosis not present

## 2021-08-06 DIAGNOSIS — I472 Ventricular tachycardia, unspecified: Secondary | ICD-10-CM | POA: Diagnosis not present

## 2021-08-06 DIAGNOSIS — E876 Hypokalemia: Secondary | ICD-10-CM

## 2021-08-06 DIAGNOSIS — E669 Obesity, unspecified: Secondary | ICD-10-CM | POA: Diagnosis not present

## 2021-08-06 DIAGNOSIS — Z20822 Contact with and (suspected) exposure to covid-19: Secondary | ICD-10-CM | POA: Diagnosis not present

## 2021-08-06 DIAGNOSIS — Z79899 Other long term (current) drug therapy: Secondary | ICD-10-CM

## 2021-08-06 DIAGNOSIS — I484 Atypical atrial flutter: Secondary | ICD-10-CM | POA: Diagnosis not present

## 2021-08-06 DIAGNOSIS — I248 Other forms of acute ischemic heart disease: Secondary | ICD-10-CM | POA: Diagnosis not present

## 2021-08-06 DIAGNOSIS — Z6839 Body mass index (BMI) 39.0-39.9, adult: Secondary | ICD-10-CM | POA: Diagnosis not present

## 2021-08-06 DIAGNOSIS — I10 Essential (primary) hypertension: Secondary | ICD-10-CM | POA: Diagnosis not present

## 2021-08-06 LAB — POTASSIUM: Potassium: 3.9 mmol/L (ref 3.5–5.1)

## 2021-08-06 LAB — MAGNESIUM: Magnesium: 2.2 mg/dL (ref 1.7–2.4)

## 2021-08-06 LAB — BASIC METABOLIC PANEL
Anion gap: 9 (ref 5–15)
BUN: 15 mg/dL (ref 6–20)
CO2: 25 mmol/L (ref 22–32)
Calcium: 8.2 mg/dL — ABNORMAL LOW (ref 8.9–10.3)
Chloride: 105 mmol/L (ref 98–111)
Creatinine, Ser: 1.33 mg/dL — ABNORMAL HIGH (ref 0.61–1.24)
GFR, Estimated: >60 mL/min (ref >60)
Glucose, Bld: 132 mg/dL — ABNORMAL HIGH (ref 70–99)
Potassium: 3.5 mmol/L (ref 3.5–5.1)
Sodium: 139 mmol/L (ref 135–145)

## 2021-08-06 MED ORDER — POTASSIUM CHLORIDE CRYS ER 20 MEQ PO TBCR: 40.0000 meq | EXTENDED_RELEASE_TABLET | Freq: Two times a day (BID) | ORAL | 2 refills | Status: DC

## 2021-08-06 MED ORDER — DILTIAZEM HCL ER COATED BEADS 180 MG PO CP24: 180.0000 mg | ORAL_CAPSULE | Freq: Every day | ORAL | 2 refills | Status: DC

## 2021-08-06 MED ORDER — POTASSIUM CHLORIDE CRYS ER 20 MEQ PO TBCR
40.0000 meq | EXTENDED_RELEASE_TABLET | Freq: Once | ORAL | Status: AC
  Administered 2021-08-06: 40 meq via ORAL
  Filled 2021-08-06: qty 2

## 2021-08-06 MED ORDER — POTASSIUM CHLORIDE CRYS ER 20 MEQ PO TBCR
20.0000 meq | EXTENDED_RELEASE_TABLET | Freq: Once | ORAL | Status: AC
  Administered 2021-08-06: 20 meq via ORAL
  Filled 2021-08-06: qty 1

## 2021-08-06 MED ORDER — DOFETILIDE 500 MCG PO CAPS: 500.0000 ug | ORAL_CAPSULE | Freq: Two times a day (BID) | ORAL | 5 refills | Status: DC

## 2021-08-06 MED ORDER — POTASSIUM CHLORIDE CRYS ER 20 MEQ PO TBCR: 40.0000 meq | EXTENDED_RELEASE_TABLET | Freq: Every day | ORAL | 2 refills | Status: DC

## 2021-08-06 NOTE — Discharge Instructions (Addendum)
Medication Changes: - STOP Flecainide. - START Tikosyn 551mcg twice daily. - DECREASE Diltiazem to 180mg  daily. - START Potassium chloride 40 mEq twice daily.  Continue all other medications as directed elsewhere on discharge paperwork  Please come by our office next Friday 08/12/2021 for repeat blood work so we can make sure your potassium is stable.  Information on my medicine - XARELTO (Rivaroxaban)  This medication education was reviewed with me or my healthcare representative as part of my discharge preparation.  The pharmacist that spoke with me during my hospital stay was:    Why was Xarelto prescribed for you? Xarelto was prescribed for you to reduce the risk of a blood clot forming that can cause a stroke if you have a medical condition called atrial fibrillation (a type of irregular heartbeat).  What do you need to know about xarelto ? Take your Xarelto ONCE DAILY at the same time every day with your evening meal. If you have difficulty swallowing the tablet whole, you may crush it and mix in applesauce just prior to taking your dose.  Take Xarelto exactly as prescribed by your doctor and DO NOT stop taking Xarelto without talking to the doctor who prescribed the medication.  Stopping without other stroke prevention medication to take the place of Xarelto may increase your risk of developing a clot that causes a stroke.  Refill your prescription before you run out.  After discharge, you should have regular check-up appointments with your healthcare provider that is prescribing your Xarelto.  In the future your dose may need to be changed if your kidney function or weight changes by a significant amount.  What do you do if you miss a dose? If you are taking Xarelto ONCE DAILY and you miss a dose, take it as soon as you remember on the same day then continue your regularly scheduled once daily regimen the next day. Do not take two doses of Xarelto at the same time or on  the same day.   Important Safety Information A possible side effect of Xarelto is bleeding. You should call your healthcare provider right away if you experience any of the following: Bleeding from an injury or your nose that does not stop. Unusual colored urine (red or dark brown) or unusual colored stools (red or black). Unusual bruising for unknown reasons. A serious fall or if you hit your head (even if there is no bleeding).  Some medicines may interact with Xarelto and might increase your risk of bleeding while on Xarelto. To help avoid this, consult your healthcare provider or pharmacist prior to using any new prescription or non-prescription medications, including herbals, vitamins, non-steroidal anti-inflammatory drugs (NSAIDs) and supplements.  This website has more information on Xarelto: [email or web address]-[email or web address]

## 2021-08-06 NOTE — TOC Progression Note (Signed)
Transition of Care [facility]) - Progression Note    Patient Details  Name: Samuel Flynn MRN: [id number] Date of Birth: [birth year]  [facility] CM/SW Contact  Zenon Mayo, RN Phone Number: 08/06/2021, 2:57 PM  Clinical Narrative:    Patient is for dc today, NCM called [facility], they are working on getting his meds ready now, they do have the tikyson in stock and it is 19.93 per pharmacy rep.         Expected Discharge Plan and Services           Expected Discharge Date: 08/06/21                                     Social Determinants of Health (SDOH) Interventions    Readmission Risk Interventions No flowsheet data found.

## 2021-08-06 NOTE — Progress Notes (Signed)
Qt is stable.  He has prominent u wave in V3-V5 which is chronic.  I feel like qt however is acceptable for discharge.   DC to home today with close follow-up in AF clinic.  Thompson Grayer MD, [facility] 08/06/2021 10:59 AM

## 2021-08-06 NOTE — Discharge Summary (Signed)
Discharge Summary    Patient ID: Samuel Flynn MRN: [id number]; DOB: 02-28-1976  Admit date: 08/02/2021 Discharge date: 08/06/2021  PCP:  Nicolette Bang, MD   [facility] Providers Cardiologist:  Skeet Latch, MD  Electrophysiologist:  Constance Haw, MD  {  Discharge Diagnoses    Principal Problem:   Atrial flutter with RVR Active Problems:   Gout   Essential hypertension, benign   OSA (obstructive sleep apnea)   History persistent atrial fibrillation   Hypokalemia   Diagnostic Studies/Procedures    None   History of Present Illness     Samuel Flynn is a 45 year old male with a history of persistent atrial fibrillation s/p ablation in 04/2018 and again in 11/2019 as well as paroxysmal atrial flutter s/p DCCV in 06/2021 on Xarelot, hypertension, and obstructive sleep apnea who was admitted on 08/02/2021 for Tikosyn load for atrial flutter.   Patient has a history of persistent atrial fibrillation s/p ablation in 04/2018 and again in 11/2019; however, more recently he has had problems with atrial flutter. He was admitted in 06/2021 with atrial flutter with RVR and underwent DCCV. He was seen by Dr. Lennie Odor for follow-up on 06/30/2021 at which time he reported intermittently going out of rhythm. He was started on Flecainide. He was then seen in the A.Fib Clinic on 08/02/2021 and reported not feeling well for the past month. EKG showed that he was in atrial flutter with 1:1 conduction at 236 bpm. Therefore, he was admitted with plans for Tikosyn load after Flecainide washout.   Hospital Course     Consultants: None  Patient was admitted for Tikosyn load for atrial flutter with ventricular rates of 236 bpm as stated above. High-sensitivity troponin minimally elevated at 35 >> 50 consistent with demand ischemia. He was started on Tikosyn on 08/03/2021 after Flecainide washout. Diltiazem was decreased to 180mg  daily due to low heart rates. Potassium  and Magnesium were repleted as needed. He tolerated the Tikosyn load well. QTc stable today. He will be discharged on Tikosyn 559mcg twice daily and Xarelto 20mg  daily.    Potassium is 3.5 on morning of discharge. He was given addition supplementation and potassium improved to 3.9 on recheck. Will discharge patient on potassium chloride 40 mEq twice daily and have him come in for repeat BMET in 1 weak. Magnesium 2.2 on day of discharge.  Patient was also noted to have a gout flare during admission. He was treated Colchicine 1.2mg  was restarted at 1.2mg  dose and and then started on 0.6mg  daily. He was advised to continue to take daily until flare resolves.   Patient was seen and examined by Dr. Rayann Heman and determined to be stable for discharge. Outpatient follow-up arranged in A.Fib Clinic. Medications as below.   Did the patient have an acute coronary syndrome (MI, NSTEMI, STEMI, etc) this admission?:  No.   The elevated Troponin was due to the acute medical illness (demand ischemia).   _____________  Discharge Vitals Blood pressure (!) 144/90, pulse 62, temperature 97.9 F (36.6 C), temperature source Oral, resp. rate 18, height 6\' 3"  (1.905 m), weight (!) 141.6 kg, SpO2 98 %.  Filed Weights   08/04/21 0611 08/05/21 0606 08/06/21 0500  Weight: (!) 141.9 kg (!) 140.2 kg (!) 141.6 kg    Labs & Radiologic Studies    CBC No results for input(s): WBC, NEUTROABS, HGB, HCT, MCV, PLT in the last 72 hours. Basic Metabolic Panel Recent Labs    08/05/21 0138 08/05/21  1118 08/06/21 0247 08/06/21 1224  NA 139 138 139  --   K 3.2* 3.9 3.5 3.9  CL 105 105 105  --   CO2 25 25 25   --   GLUCOSE 143* 119* 132*  --   BUN 14 14 15   --   CREATININE 1.27* 1.13 1.33*  --   CALCIUM 8.3* 8.5* 8.2*  --   MG 2.3  --  2.2  --    Liver Function Tests No results for input(s): AST, ALT, ALKPHOS, BILITOT, PROT, ALBUMIN in the last 72 hours. No results for input(s): LIPASE, AMYLASE in the last 72  hours. High Sensitivity Troponin:   Recent Labs  Lab 08/02/21 1602 08/02/21 1812  TROPONINIHS 35* 50*    BNP Invalid input(s): POCBNP D-Dimer No results for input(s): DDIMER in the last 72 hours. Hemoglobin A1C No results for input(s): HGBA1C in the last 72 hours. Fasting Lipid Panel No results for input(s): CHOL, HDL, LDLCALC, TRIG, CHOLHDL, LDLDIRECT in the last 72 hours. Thyroid Function Tests No results for input(s): TSH, T4TOTAL, T3FREE, THYROIDAB in the last 72 hours.  Invalid input(s): FREET3 _____________  DG Chest 2 View  Result Date: 08/02/2021 CLINICAL DATA:  Possible pneumonia. EXAM: CHEST - 2 VIEW COMPARISON:  April 22, 2021. FINDINGS: The heart size and mediastinal contours are within normal limits. Both lungs are clear. The visualized skeletal structures are unremarkable. IMPRESSION: No active cardiopulmonary disease. Electronically Signed   By: Marijo Conception M.D.   On: 08/02/2021 16:55   Disposition   Patient is being discharged home today in good condition.  Follow-up Plans & Appointments     Follow-up Information     [facility] Follow up.   Specialty: Cardiology Why: on 12/13 at 3 pm for post hospital tikosyn follow up Contact information: [address] [id number] [place] [phone]               Discharge Instructions     Diet - low sodium heart healthy   Complete by: As directed    Increase activity slowly   Complete by: As directed        Discharge Medications   Allergies as of 08/06/2021   No Known Allergies      Medication List     STOP taking these medications    flecainide 100 MG tablet Commonly known as: TAMBOCOR       TAKE these medications    acetaminophen 325 MG tablet Commonly known as: TYLENOL Take 2 tablets (650 mg total) by mouth every 4 (four) hours as needed for headache or mild pain.   allopurinol 100 MG tablet Commonly known as:  ZYLOPRIM TAKE 1 TABLET BY MOUTH ONCE DAILY FOR GOUT What changed: See the new instructions.   colchicine 0.6 MG tablet Take 1 tablet (0.6 mg total) by mouth daily as needed (gout flare).   diltiazem 180 MG 24 hr capsule Commonly known as: CARDIZEM CD Take 1 capsule (180 mg total) by mouth daily. Start taking on: August 07, 2021 What changed:  medication strength how much to take   dofetilide 500 MCG capsule Commonly known as: TIKOSYN Take 1 capsule (500 mcg total) by mouth 2 (two) times daily.   Farxiga 10 MG Tabs tablet Generic drug: dapagliflozin propanediol Take 1 tablet (10 mg total) by mouth daily.   furosemide 40 MG tablet Commonly known as: LASIX Take 1 tablet by mouth twice daily   pantoprazole 40 MG tablet  Commonly known as: PROTONIX Take 1 tablet (40 mg total) by mouth daily.   potassium chloride SA 20 MEQ tablet Commonly known as: KLOR-CON M Take 2 tablets (40 mEq total) by mouth 2 (two) times daily.   traZODone 50 MG tablet Commonly known as: DESYREL Take 0.5-1 tablets (25-50 mg total) by mouth at bedtime as needed for sleep.   Xarelto 20 MG Tabs tablet Generic drug: rivaroxaban Take 1 tablet (20 mg total) by mouth daily with supper.           Outstanding Labs/Studies   Repeat BMET in 1 week.  Duration of Discharge Encounter   Greater than 30 minutes including physician time.  Signed, Darreld Mclean, PA-C 08/06/2021, 1:58 PM

## 2021-08-06 NOTE — Progress Notes (Signed)
Pt safely discharged. Discharge packet provided with teach-back method. VS wnL and as per flow. IVs removed, Pt verbalized understanding. All questions and concerns addressed. Spouse available for transport.

## 2021-08-06 NOTE — Progress Notes (Addendum)
Pharmacy: Dofetilide (Tikosyn) - Follow Up Assessment and Electrolyte Replacement  Pharmacy consulted to assist in monitoring and replacing electrolytes in this 45 y.o. male admitted on 08/02/2021 undergoing dofetilide initiation. First dofetilide dose: 08/03/21 pm  Labs:    Component Value Date/Time   K 3.5 08/06/2021 0247   MG 2.2 08/06/2021 0247     Plan: Potassium: K 3.5: KCl 20 mEq po x1 in addition to 56meq qday  Magnesium: Mg > 2: No additional supplementation needed  As patient has required on average 45 mEq of potassium replacement every day, recommend discharging patient with prescription for:  Potassium chloride 40 mEq  daily  Onnie Boer, PharmD, [place], AAHIVP, CPP Infectious Disease Pharmacist 08/06/2021 8:12 AM   Addendum  Repeat k+ came back at 3.9 after supplementation. We will give an additional 46meq x1 dose. Recheck level in AM if still here.  Onnie Boer, PharmD, BCIDP, AAHIVP, CPP Infectious Disease Pharmacist 08/06/2021 1:23 PM

## 2021-08-06 NOTE — Progress Notes (Signed)
Ordered repeat BMET in 1 week to ensure potassium is stable on Tikosyn. Please see discharge summary from today for additional information.  Darreld Mclean, PA-C 08/06/2021 2:23 PM

## 2021-08-08 ENCOUNTER — Telehealth: Payer: Self-pay

## 2021-08-08 ENCOUNTER — Other Ambulatory Visit: Payer: Self-pay

## 2021-08-08 NOTE — Telephone Encounter (Signed)
Transition Care Management Follow-up Telephone Call Date of discharge and from where: 08/06/2021, [facility]  How have you been since you were released from the hospital? He said he was doing better than he was doing when he went into the hospital  Any questions or concerns? No  Items Reviewed: Did the pt receive and understand the discharge instructions provided? Yes  Medications obtained and verified?  He said he has all medications except the farxiga. He is waiting to find out the cost of a refill.   He didn't have any questions about his med regime.  Other? No  Any new allergies since your discharge? No  Dietary orders reviewed? Yes Do you have support at home? Yes   Home Care and Equipment/Supplies: Were home health services ordered? no If so, what is the name of the agency? N/a  Has the agency set up a time to come to the patient's home? not applicable Were any new equipment or medical supplies ordered?  No What is the name of the medical supply agency? N/a Were you able to get the supplies/equipment? not applicable Do you have any questions related to the use of the equipment or supplies? No  Functional Questionnaire: (I = Independent and D = Dependent) ADLs: independent   Follow up appointments reviewed:  PCP Hospital f/u appt confirmed?  Dr Juleen China is listed as his PCP but she is no longer at [facility]. He said he will call to schedule an appointment with another provider. He didn't want to schedule anything at this time    [facility] f/u appt confirmed? Yes  Scheduled to see  A Fib clinic labs - 08/12/2021 and A Fib Clinic appt - 08/16/2021.  Are transportation arrangements needed? No  If their condition worsens, is the pt aware to call PCP or go to the Emergency Dept.? Yes Was the patient provided with contact information for the PCP's office or ED? Yes Was to pt encouraged to call back with questions or concerns? Yes

## 2021-08-09 ENCOUNTER — Other Ambulatory Visit: Payer: Self-pay

## 2021-08-10 ENCOUNTER — Other Ambulatory Visit: Payer: Self-pay

## 2021-08-12 ENCOUNTER — Other Ambulatory Visit: Payer: Medicaid Other

## 2021-08-15 ENCOUNTER — Other Ambulatory Visit: Payer: Self-pay

## 2021-08-16 ENCOUNTER — Encounter (HOSPITAL_COMMUNITY): Payer: Self-pay | Admitting: Nurse Practitioner

## 2021-08-16 ENCOUNTER — Ambulatory Visit (HOSPITAL_COMMUNITY)
Admit: 2021-08-16 | Discharge: 2021-08-16 | Disposition: A | Discharge status: Home / Self Care | Payer: Medicaid Other | Attending: Nurse Practitioner | Admitting: Nurse Practitioner

## 2021-08-16 ENCOUNTER — Other Ambulatory Visit: Payer: Self-pay

## 2021-08-16 VITALS — BP 138/82 | HR 73 | Ht 75.0 in | Wt 309.0 lb

## 2021-08-16 DIAGNOSIS — Z7901 Long term (current) use of anticoagulants: Secondary | ICD-10-CM | POA: Diagnosis not present

## 2021-08-16 DIAGNOSIS — I4892 Unspecified atrial flutter: Secondary | ICD-10-CM

## 2021-08-16 DIAGNOSIS — I480 Paroxysmal atrial fibrillation: Secondary | ICD-10-CM | POA: Diagnosis not present

## 2021-08-16 DIAGNOSIS — D6869 Other thrombophilia: Secondary | ICD-10-CM | POA: Diagnosis not present

## 2021-08-16 DIAGNOSIS — I4819 Other persistent atrial fibrillation: Secondary | ICD-10-CM | POA: Diagnosis not present

## 2021-08-16 DIAGNOSIS — I10 Essential (primary) hypertension: Secondary | ICD-10-CM | POA: Insufficient documentation

## 2021-08-16 DIAGNOSIS — Z79899 Other long term (current) drug therapy: Secondary | ICD-10-CM | POA: Diagnosis not present

## 2021-08-16 DIAGNOSIS — G4733 Obstructive sleep apnea (adult) (pediatric): Secondary | ICD-10-CM | POA: Insufficient documentation

## 2021-08-16 LAB — BASIC METABOLIC PANEL
Anion gap: 9 (ref 5–15)
BUN: 15 mg/dL (ref 6–20)
CO2: 26 mmol/L (ref 22–32)
Calcium: 9.1 mg/dL (ref 8.9–10.3)
Chloride: 103 mmol/L (ref 98–111)
Creatinine, Ser: 1.17 mg/dL (ref 0.61–1.24)
GFR, Estimated: >60 mL/min (ref >60)
Glucose, Bld: 109 mg/dL — ABNORMAL HIGH (ref 70–99)
Potassium: 3.7 mmol/L (ref 3.5–5.1)
Sodium: 138 mmol/L (ref 135–145)

## 2021-08-16 LAB — MAGNESIUM: Magnesium: 2.1 mg/dL (ref 1.7–2.4)

## 2021-08-16 NOTE — Progress Notes (Signed)
Primary Care Physician: No primary care provider on file. Referring Physician: Dr. Michele Rockers is a 45 y.o. male with a h/o afib, s/p ablation 2019 and again in 2021, OSA and recent 1:1 atrial flutter with v rates in the 230's  when I saw him f/u flecainide start on last visit in [place] clinic, 08/02/21 . He was sent to the ER and he had emergent cardioversion. He was then admitted, flecainide was washed out and he was loaded on tikosyn.   He is now f/u in the afib clinic and is in [place] with qtc stable at 416 ms. He states he still feels fatigued but better overall. He is being compliant with tikosyn.   Today, he denies symptoms of palpitations, chest pain, shortness of breath, orthopnea, PND, lower extremity edema, dizziness, presyncope, syncope, or neurologic sequela. The patient is tolerating medications without difficulties and is otherwise without complaint today.   Past Medical History:  Diagnosis Date   Atrial fibrillation with RVR ([place])    Gastric ulcer    Gout    Hypertension    Paroxysmal A-fib (HCC)    Sleep apnea    USES CPAP   Wears glasses    Past Surgical History:  Procedure Laterality Date   ATRIAL FIBRILLATION ABLATION N/A 04/26/2018   Procedure: ATRIAL FIBRILLATION ABLATION;  Surgeon: Constance Haw, MD;  Location: [facility];  Service: Cardiovascular;  Laterality: N/A;   ATRIAL FIBRILLATION ABLATION N/A 11/07/2019   Procedure: ATRIAL FIBRILLATION ABLATION;  Surgeon: Constance Haw, MD;  Location: [facility];  Service: Cardiovascular;  Laterality: N/A;   CARDIOVERSION N/A 01/18/2018   Procedure: CARDIOVERSION;  Surgeon: Larey Dresser, MD;  Location: [facility] ENDOSCOPY;  Service: Cardiovascular;  Laterality: N/A;   CARDIOVERSION N/A 08/19/2019   Procedure: CARDIOVERSION;  Surgeon: Thayer Headings, MD;  Location: [facility] ENDOSCOPY;  Service: Cardiovascular;  Laterality: N/A;   CARDIOVERSION N/A 04/18/2021   Procedure: CARDIOVERSION;   Surgeon: Acie Fredrickson Wonda Cheng, MD;  Location: [facility] ENDOSCOPY;  Service: Cardiovascular;  Laterality: N/A;   ELBOW LIGAMENT RECONSTRUCTION Right 09/29/2014   Procedure: Merian Capron FRACTURE FIXATION, POSSIBLE LIGAMENT REPAIR;  Surgeon: Nita Sells, MD;  Location: [place];  Service: Orthopedics;  Laterality: Right;  Right elbow coranoid fracture fixation, possible ligament repair   TEE WITHOUT CARDIOVERSION N/A 01/18/2018   Procedure: TRANSESOPHAGEAL ECHOCARDIOGRAM (TEE);  Surgeon: Larey Dresser, MD;  Location: [facility] ENDOSCOPY;  Service: Cardiovascular;  Laterality: N/A;   TEE WITHOUT CARDIOVERSION N/A 04/18/2021   Procedure: TRANSESOPHAGEAL ECHOCARDIOGRAM (TEE);  Surgeon: Thayer Headings, MD;  Location: [facility] ENDOSCOPY;  Service: Cardiovascular;  Laterality: N/A;   WISDOM TOOTH EXTRACTION      Current Outpatient Medications  Medication Sig Dispense Refill   acetaminophen (TYLENOL) 325 MG tablet Take 2 tablets (650 mg total) by mouth every 4 (four) hours as needed for headache or mild pain.     allopurinol (ZYLOPRIM) 100 MG tablet TAKE 1 TABLET BY MOUTH ONCE DAILY FOR GOUT 30 tablet 0   colchicine 0.6 MG tablet Take 1 tablet (0.6 mg total) by mouth daily as needed (gout flare). 30 tablet 1   diltiazem (CARDIZEM CD) 180 MG 24 hr capsule Take 1 capsule (180 mg total) by mouth daily. 30 capsule 2   dofetilide (TIKOSYN) 500 MCG capsule Take 1 capsule (500 mcg total) by mouth 2 (two) times daily. 60 capsule 5   furosemide (LASIX) 40 MG tablet Take 1 tablet by mouth twice  daily 180 tablet 1   pantoprazole (PROTONIX) 40 MG tablet Take 1 tablet (40 mg total) by mouth daily. 90 tablet 4   potassium chloride SA (KLOR-CON M) 20 MEQ tablet Take 2 tablets (40 mEq total) by mouth 2 (two) times daily. 120 tablet 2   traZODone (DESYREL) 50 MG tablet Take 0.5-1 tablets (25-50 mg total) by mouth at bedtime as needed for sleep. 30 tablet 3   XARELTO 20 MG TABS tablet Take 1 tablet (20 mg total) by  mouth daily with supper. 30 tablet 2   dapagliflozin propanediol (FARXIGA) 10 MG TABS tablet Take 1 tablet (10 mg total) by mouth daily. (Patient not taking: Reported on 08/16/2021) 30 tablet 2   No current facility-administered medications for this encounter.    No Known Allergies  Social History   Socioeconomic History   Marital status: Married    Spouse name: Not on file   Number of children: 4   Years of education: 10   Highest education level: Not on file  Occupational History   Occupation: Truck Education administrator: [employer]  Tobacco Use   Smoking status: Former    Types: Cigars    Quit date: 07/20/2021    Years since quitting: 0.0   Smokeless tobacco: Never   Tobacco comments:    Former smoker 08/16/21  Vaping Use   Vaping Use: Never used  Substance and Sexual Activity   Alcohol use: Not Currently    Comment: occ   Drug use: No   Sexual activity: Yes    Comment: 5 a day  Other Topics Concern   Not on file  Social History Narrative   Not on file   Social Determinants of Health   Financial Resource Strain: High Risk   Difficulty of Paying Living Expenses: Hard  Food Insecurity: No Food Insecurity   Worried About Running Out of Food in the Last Year: Never true   Ran Out of Food in the Last Year: Never true  Transportation Needs: No Transportation Needs   Lack of Transportation (Medical): No   Lack of Transportation (Non-Medical): No  Physical Activity: Not on file  Stress: Not on file  Social Connections: Not on file  Intimate Partner Violence: Not on file    Family History  Problem Relation Age of Onset   Hypertension Paternal Uncle    Hypertension Maternal Grandmother    Hypertension Paternal Uncle    Hypertension Paternal Uncle    Hypertension Paternal Uncle    Hypertension Paternal Uncle    Hypertension Mother    Stroke Father    Heart disease Father    Heart attack Father    Hypertension Father    Colon cancer Neg Hx     ROS-  All systems are reviewed and negative except as per the HPI above  Physical Exam: Vitals:   08/16/21 1407  BP: 138/82  Pulse: 73  Weight: (!) 140.2 kg  Height: 6\' 3"  (1.905 m)   Wt Readings from Last 3 Encounters:  08/16/21 (!) 140.2 kg  08/06/21 (!) 141.6 kg  08/02/21 (!) 141.1 kg    Labs: Lab Results  Component Value Date   NA 139 08/06/2021   K 3.9 08/06/2021   CL 105 08/06/2021   CO2 25 08/06/2021   GLUCOSE 132 (H) 08/06/2021   BUN 15 08/06/2021   CREATININE 1.33 (H) 08/06/2021   CALCIUM 8.2 (L) 08/06/2021   MG 2.2 08/06/2021   Lab Results  Component Value  Date   INR 1.1 04/17/2021   Lab Results  Component Value Date   CHOL 203 (H) 04/16/2021   HDL 24 (L) 04/16/2021   LDLCALC UNABLE TO CALCULATE IF TRIGLYCERIDE OVER 400 mg/dL 04/16/2021   TRIG 428 (H) 04/16/2021     GEN- The patient is well appearing, alert and oriented x 3 today.   Head- normocephalic, atraumatic Eyes-  Sclera clear, conjunctiva pink Ears- hearing intact Oropharynx- clear Neck- supple, no JVP Lymph- no cervical lymphadenopathy Lungs- Clear to ausculation bilaterally, normal work of breathing Heart- Regular rate and rhythm, no murmurs, rubs or gallops, PMI not laterally displaced GI- soft, NT, ND, + BS Extremities- no clubbing, cyanosis, or edema MS- no significant deformity or atrophy Skin- no rash or lesion Psych- euthymic mood, full affect Neuro- strength and sensation are intact  EKG-Normal sinus rhythm at 73 bpm, pr int 120 ms, qrs int 80 ms, qtc 416 ms  Epic records reviewed     Assessment and Plan:  1. Afib Failed flecainide with 1:1 atrial flutter  with v rates in the 230's  Received successful emergent cardioversion in the ER and was admitted for tikosyn load  after flecainide wash out He is in SR today  Continue Tikosyn at 500 mcg bid, precautions discussed  Continue Cardizem 180  mg daily   2. CHA2DS2VASc  score of at least 2 Continue xarelto 20 mg daily   3.  HTN Stable today   F/u with Dr. Curt Bears 09/20/21   Geroge Baseman. Elira Colasanti, [facility] [address] [place], [place] [address] [phone]

## 2021-08-24 ENCOUNTER — Other Ambulatory Visit: Payer: Self-pay

## 2021-08-24 ENCOUNTER — Encounter (HOSPITAL_COMMUNITY): Payer: Self-pay | Admitting: Nurse Practitioner

## 2021-08-24 ENCOUNTER — Ambulatory Visit (HOSPITAL_COMMUNITY)
Admission: RE | Admit: 2021-08-24 | Discharge: 2021-08-24 | Disposition: A | Discharge status: Home / Self Care | Payer: Medicaid Other | Source: Ambulatory Visit | Attending: Nurse Practitioner | Admitting: Nurse Practitioner

## 2021-08-24 DIAGNOSIS — I4819 Other persistent atrial fibrillation: Secondary | ICD-10-CM | POA: Insufficient documentation

## 2021-08-24 LAB — BASIC METABOLIC PANEL
Anion gap: 12 (ref 5–15)
BUN: 12 mg/dL (ref 6–20)
CO2: 25 mmol/L (ref 22–32)
Calcium: 9 mg/dL (ref 8.9–10.3)
Chloride: 102 mmol/L (ref 98–111)
Creatinine, Ser: 1.11 mg/dL (ref 0.61–1.24)
GFR, Estimated: >60 mL/min (ref >60)
Glucose, Bld: 154 mg/dL — ABNORMAL HIGH (ref 70–99)
Potassium: 4.1 mmol/L (ref 3.5–5.1)
Sodium: 139 mmol/L (ref 135–145)

## 2021-08-24 LAB — MAGNESIUM: Magnesium: 2.1 mg/dL (ref 1.7–2.4)

## 2021-08-25 ENCOUNTER — Other Ambulatory Visit: Payer: Self-pay

## 2021-08-25 NOTE — Telephone Encounter (Signed)
Created in error

## 2021-08-29 NOTE — Progress Notes (Signed)
TRANSITION OF CARE VISIT   Primary Care Provider (PCP):      Dorna Mai, MD                                                      [facility] at [facility] [address] [place],  [place]  [address] Phone: [phone] Fax: [phone]     Date of Admission: 08/02/2021  Date of Discharge: 08/06/2021  Transitions of Care Call: 08/08/2021  Discharged from: [facility]   Discharge Diagnosis:  Principal Problem:   Atrial flutter with RVR Active Problems:   Gout   Essential hypertension, benign   OSA (obstructive sleep apnea)   History persistent atrial fibrillation   Hypokalemia    Summary of Admission per MD note: Patient was admitted for Tikosyn load for atrial flutter with ventricular rates of 236 bpm as stated above. High-sensitivity troponin minimally elevated at 35 >> 50 consistent with demand ischemia. He was started on Tikosyn on 08/03/2021 after Flecainide washout. Diltiazem was decreased to 180mg  daily due to low heart rates. Potassium and Magnesium were repleted as needed. He tolerated the Tikosyn load well. QTc stable today. He will be discharged on Tikosyn 558mcg twice daily and Xarelto 20mg  daily.     Potassium is 3.5 on morning of discharge. He was given addition supplementation and potassium improved to 3.9 on recheck. Will discharge patient on potassium chloride 40 mEq twice daily and have him come in for repeat BMET in 1 weak. Magnesium 2.2 on day of discharge.  Patient was also noted to have a gout flare during admission. He was treated Colchicine 1.2mg  was restarted at 1.2mg  dose and and then started on 0.6mg  daily. He was advised to continue to take daily until flare resolves.   Patient was seen and examined by Dr. Rayann Heman and determined to be stable for discharge. Outpatient follow-up arranged in [facility]. Medications as below.    Did the patient have an  acute coronary syndrome (MI, NSTEMI, STEMI, etc) this admission?:  No.   The elevated Troponin was due to the acute medical illness (demand ischemia).    08/16/2021 at [facility] per NP note: 1. Afib Failed flecainide with 1:1 atrial flutter  with v rates in the 230's  Received successful emergent cardioversion in the ER and was admitted for tikosyn load  after flecainide wash out He is in SR today  Continue Tikosyn at 500 mcg bid, precautions discussed  Continue Cardizem 180  mg daily    2. CHA2DS2VASc  score of at least 2 Continue xarelto 20 mg daily    3. HTN Stable today    F/u with Dr. Curt Bears 09/20/21   TODAY's VISIT Doing well on current regimen managed by [facility]. Reports does feel a little slower than normal and notices fluctuations in his heart rate. Requesting refills on Farxiga and Protonix be sent to [facility] on [address]. Considering bariatric surgery to improve health. Would like to lose 100 pounds. He is monitoring what he eats and staying as active as possible. Has skin tags on face and neck for 2 years and would like removed.    Patient/Caregiver self-reported problems/concerns: Needs refills on Farxiga and Protonix  MEDICATIONS  Medication Reconciliation conducted with patient/caregiver? (Yes/ No):Yes   New  medications prescribed/discontinued upon discharge? (Yes/No): Yes   Barriers identified related to medications: Unable to get [place] since hospital discharge. LABS  Lab Reviewed (Yes/No/NA): Yes  PHYSICAL EXAM:  Vitals with BMI 09/01/2021 09/01/2021 08/24/2021  Height - - 6\' 2"   Weight - 314 lbs 316 lbs  BMI - 84.1 32.44  Systolic 010 272 536  Diastolic 93 92 74  Pulse - 61 70   Physical Exam HENT:     Head: Normocephalic and atraumatic.  Eyes:     Extraocular Movements: Extraocular movements intact.     Conjunctiva/sclera: Conjunctivae normal.     Pupils: Pupils are equal, round, and reactive to light.   Cardiovascular:     Rate and Rhythm: Normal rate and regular rhythm.     Pulses: Normal pulses.     Heart sounds: Normal heart sounds.  Pulmonary:     Effort: Pulmonary effort is normal.     Breath sounds: Normal breath sounds.  Musculoskeletal:     Cervical back: Normal range of motion and neck supple.  Skin:    Comments: Facial skin tags.  Neurological:     General: No focal deficit present.     Mental Status: He is alert and oriented to person, place, and time.  Psychiatric:        Mood and Affect: Mood normal.        Behavior: Behavior normal.   ASSESSMENT & PLAN: 1. Hospital discharge follow-up: - Reviewed hospital course, current medications, ensured proper follow-up in place, and addressed concerns.   2. Atrial flutter, unspecified type ([place]): 3. History persistent atrial fibrillation: 4. Essential hypertension, benign: - Keep next scheduled appointment with Allegra Lai, MD on 09/20/2021 located at Cardiology. - Keep all scheduled appointments with [facility].  5. Hypokalemia: - Resolved, last BMET obtained 08/24/2021.  6. Encounter for weight management: 7. Class 3 severe obesity with serious comorbidity and body mass index (BMI) of 40.0 to 44.9 in adult, unspecified obesity type [facility]): - Per patient preference referral to Bariatric Surgery for further evaluation and management.  - Amb Referral to Bariatric Surgery  8. Multiple acquired skin tags: - Per patient preference referral to Dermatology for further evaluation and management.  - Ambulatory referral to Dermatology  9. Type 2 diabetes mellitus without complication, without long-term current use of insulin ([place]): - Dapagliflozin Propanediol refilled per patient request.  - Follow-up with primary provider as scheduled. - dapagliflozin propanediol (FARXIGA) 10 MG TABS tablet; Take 1 tablet (10 mg total) by mouth daily.  Dispense: 30 tablet; Refill: 2  10. Gastroesophageal reflux  disease, unspecified whether esophagitis present: - Continue Pantoprazole as prescribed.  - Follow-up with primary provider as scheduled.  - pantoprazole (PROTONIX) 40 MG tablet; Take 1 tablet (40 mg total) by mouth daily.  Dispense: 90 tablet; Refill: 1    PATIENT EDUCATION PROVIDED: See AVS   FOLLOW-UP (Include any further testing or referrals): - Follow-up with primary provider as scheduled.  - Keep all scheduled appointments at [place] all scheduled appointments at Cardiology.  - Referral to Bariatric Surgery.  - Referral to Dermatology.

## 2021-08-30 ENCOUNTER — Other Ambulatory Visit: Payer: Self-pay

## 2021-09-01 ENCOUNTER — Ambulatory Visit (INDEPENDENT_AMBULATORY_CARE_PROVIDER_SITE_OTHER): Payer: Medicaid Other | Admitting: Family

## 2021-09-01 ENCOUNTER — Other Ambulatory Visit: Payer: Self-pay

## 2021-09-01 VITALS — BP 147/93 | HR 61 | Temp 98.4°F | Resp 18 | Wt 314.0 lb

## 2021-09-01 DIAGNOSIS — I4819 Other persistent atrial fibrillation: Secondary | ICD-10-CM

## 2021-09-01 DIAGNOSIS — Z6841 Body Mass Index (BMI) 40.0 and over, adult: Secondary | ICD-10-CM | POA: Diagnosis not present

## 2021-09-01 DIAGNOSIS — I10 Essential (primary) hypertension: Secondary | ICD-10-CM | POA: Diagnosis not present

## 2021-09-01 DIAGNOSIS — K219 Gastro-esophageal reflux disease without esophagitis: Secondary | ICD-10-CM

## 2021-09-01 DIAGNOSIS — L918 Other hypertrophic disorders of the skin: Secondary | ICD-10-CM | POA: Diagnosis not present

## 2021-09-01 DIAGNOSIS — E119 Type 2 diabetes mellitus without complications: Secondary | ICD-10-CM

## 2021-09-01 DIAGNOSIS — I4892 Unspecified atrial flutter: Secondary | ICD-10-CM

## 2021-09-01 DIAGNOSIS — E876 Hypokalemia: Secondary | ICD-10-CM | POA: Diagnosis not present

## 2021-09-01 DIAGNOSIS — Z09 Encounter for follow-up examination after completed treatment for conditions other than malignant neoplasm: Secondary | ICD-10-CM | POA: Diagnosis not present

## 2021-09-01 DIAGNOSIS — Z7689 Persons encountering health services in other specified circumstances: Secondary | ICD-10-CM

## 2021-09-01 MED ORDER — DAPAGLIFLOZIN PROPANEDIOL 10 MG PO TABS: 10.0000 mg | ORAL_TABLET | Freq: Every day | ORAL | 2 refills | Status: DC

## 2021-09-01 MED ORDER — PANTOPRAZOLE SODIUM 40 MG PO TBEC: 40.0000 mg | DELAYED_RELEASE_TABLET | Freq: Every day | ORAL | 1 refills | Status: DC

## 2021-09-01 NOTE — Progress Notes (Signed)
Pt presents for hospital follow-up, pt request referral for removal of skin tags and wants weight loss surgery

## 2021-09-04 DIAGNOSIS — Z419 Encounter for procedure for purposes other than remedying health state, unspecified: Secondary | ICD-10-CM | POA: Diagnosis not present

## 2021-09-13 ENCOUNTER — Other Ambulatory Visit: Payer: Self-pay | Admitting: Family

## 2021-09-13 ENCOUNTER — Other Ambulatory Visit (HOSPITAL_BASED_OUTPATIENT_CLINIC_OR_DEPARTMENT_OTHER): Payer: Self-pay | Admitting: Cardiovascular Disease

## 2021-09-13 DIAGNOSIS — M1000 Idiopathic gout, unspecified site: Secondary | ICD-10-CM

## 2021-09-16 ENCOUNTER — Ambulatory Visit (INDEPENDENT_AMBULATORY_CARE_PROVIDER_SITE_OTHER): Payer: Medicaid Other | Admitting: Family Medicine

## 2021-09-16 ENCOUNTER — Other Ambulatory Visit: Payer: Self-pay

## 2021-09-16 ENCOUNTER — Encounter: Payer: Self-pay | Admitting: Family Medicine

## 2021-09-16 VITALS — BP 135/83 | HR 71 | Temp 98.4°F | Resp 16 | Ht 74.0 in | Wt 317.8 lb

## 2021-09-16 DIAGNOSIS — Z6841 Body Mass Index (BMI) 40.0 and over, adult: Secondary | ICD-10-CM | POA: Diagnosis not present

## 2021-09-16 DIAGNOSIS — I10 Essential (primary) hypertension: Secondary | ICD-10-CM | POA: Diagnosis not present

## 2021-09-16 DIAGNOSIS — E119 Type 2 diabetes mellitus without complications: Secondary | ICD-10-CM

## 2021-09-16 DIAGNOSIS — M1000 Idiopathic gout, unspecified site: Secondary | ICD-10-CM | POA: Diagnosis not present

## 2021-09-16 LAB — POCT GLYCOSYLATED HEMOGLOBIN (HGB A1C): Hemoglobin A1C: 7 % — AB (ref 4.0–5.6)

## 2021-09-16 MED ORDER — METFORMIN HCL ER 500 MG PO TB24: 500.0000 mg | ORAL_TABLET | Freq: Every day | ORAL | 1 refills | Status: DC

## 2021-09-16 MED ORDER — COLCHICINE 0.6 MG PO TABS: 0.6000 mg | ORAL_TABLET | Freq: Every day | ORAL | 1 refills | Status: DC | PRN

## 2021-09-16 MED ORDER — DAPAGLIFLOZIN PROPANEDIOL 10 MG PO TABS: 10.0000 mg | ORAL_TABLET | Freq: Every day | ORAL | 2 refills | Status: DC

## 2021-09-16 MED ORDER — MIRTAZAPINE 30 MG PO TABS: 30.0000 mg | ORAL_TABLET | Freq: Every day | ORAL | 3 refills | Status: DC

## 2021-09-16 NOTE — Progress Notes (Signed)
Patient is here for paper work and follow-up DM  Patient has no other concerns for provider today

## 2021-09-17 ENCOUNTER — Encounter: Payer: Self-pay | Admitting: Family Medicine

## 2021-09-17 NOTE — Progress Notes (Signed)
Established Patient Office Visit  Subjective:  Patient ID: Samuel Flynn, male    DOB: 1975/12/24  Age: 46 y.o. MRN: [id number]  CC:  Chief Complaint  Patient presents with   Follow-up    Paper work   Diabetes    HPI Samuel Flynn [facility] presents for follow up of chronic med issues including diabetes and hypertension. Patient denies acute complaints or concerns. 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   Past Medical History:  Diagnosis Date   Atrial fibrillation with RVR ([place])    Gastric ulcer    Gout    Hypertension    Paroxysmal A-fib ([place])    Sleep apnea    USES CPAP   Wears glasses     Past Surgical History:  Procedure Laterality Date   ATRIAL FIBRILLATION ABLATION N/A 04/26/2018   Procedure: ATRIAL FIBRILLATION ABLATION;  Surgeon: Constance Haw, MD;  Location: [place] CV LAB;  Service: Cardiovascular;  Laterality: N/A;   ATRIAL FIBRILLATION ABLATION N/A 11/07/2019   Procedure: ATRIAL FIBRILLATION ABLATION;  Surgeon: Constance Haw, MD;  Location: [place] CV LAB;  Service: Cardiovascular;  Laterality: N/A;   CARDIOVERSION N/A 01/18/2018   Procedure: CARDIOVERSION;  Surgeon: Larey Dresser, MD;  Location: [facility] ENDOSCOPY;  Service: Cardiovascular;  Laterality: N/A;   CARDIOVERSION N/A 08/19/2019   Procedure: CARDIOVERSION;  Surgeon: Thayer Headings, MD;  Location: [facility] ENDOSCOPY;  Service: Cardiovascular;  Laterality: N/A;   CARDIOVERSION N/A 04/18/2021   Procedure: CARDIOVERSION;  Surgeon: Acie Fredrickson Wonda Cheng, MD;  Location: [facility] ENDOSCOPY;  Service: Cardiovascular;  Laterality: N/A;   ELBOW LIGAMENT RECONSTRUCTION Right 09/29/2014   Procedure: Merian Capron FRACTURE FIXATION, POSSIBLE LIGAMENT REPAIR;  Surgeon: Nita Sells, MD;  Location: [place];  Service: Orthopedics;  Laterality: Right;  Right elbow coranoid fracture fixation, possible ligament repair   TEE WITHOUT CARDIOVERSION N/A 01/18/2018    Procedure: TRANSESOPHAGEAL ECHOCARDIOGRAM (TEE);  Surgeon: Larey Dresser, MD;  Location: [facility] ENDOSCOPY;  Service: Cardiovascular;  Laterality: N/A;   TEE WITHOUT CARDIOVERSION N/A 04/18/2021   Procedure: TRANSESOPHAGEAL ECHOCARDIOGRAM (TEE);  Surgeon: Acie Fredrickson, Wonda Cheng, MD;  Location: [facility] ENDOSCOPY;  Service: Cardiovascular;  Laterality: N/A;   WISDOM TOOTH EXTRACTION      Family History  Problem Relation Age of Onset   Hypertension Paternal Uncle    Hypertension Maternal Grandmother    Hypertension Paternal Uncle    Hypertension Paternal Uncle    Hypertension Paternal Uncle    Hypertension Paternal Uncle    Hypertension Mother    Stroke Father    Heart disease Father    Heart attack Father    Hypertension Father    Colon cancer Neg Hx     Social History   Socioeconomic History   Marital status: Married    Spouse name: Not on file   Number of children: 4   Years of education: 10   Highest education level: Not on file  Occupational History   Occupation: Truck Education administrator: [employer]  Tobacco Use   Smoking status: Former    Types: Cigars    Quit date: 07/20/2021    Years since quitting: 0.1   Smokeless tobacco: Never   Tobacco comments:    Former smoker 08/16/21  Vaping Use   Vaping Use: Never used  Substance and Sexual Activity   Alcohol use: Not Currently    Comment: occ   Drug use: No   Sexual activity: Yes  Comment: 5 a day  Other Topics Concern   Not on file  Social History Narrative   Not on file   Social Determinants of Health   Financial Resource Strain: High Risk   Difficulty of Paying Living Expenses: Hard  Food Insecurity: No Food Insecurity   Worried About Running Out of Food in the Last Year: Never true   Ran Out of Food in the Last Year: Never true  Transportation Needs: No Transportation Needs   Lack of Transportation (Medical): No   Lack of Transportation (Non-Medical): No  Physical Activity: Not on file  Stress: Not on  file  Social Connections: Not on file  Intimate Partner Violence: Not on file    ROS Review of Systems  All other systems reviewed and are negative.  Objective:   Today's Vitals: BP 135/83    Pulse 71    Temp 98.4 F (36.9 C) (Oral)    Resp 16    Ht 6\' 2"  (1.88 m)    Wt (!) 317 lb 12.8 oz (144.2 kg)    SpO2 93%    BMI 40.80 kg/m   Physical Exam Vitals and nursing note reviewed.  Constitutional:      General: He is not in acute distress.    Appearance: He is obese.  Cardiovascular:     Rate and Rhythm: Normal rate and regular rhythm.  Pulmonary:     Effort: Pulmonary effort is normal.     Breath sounds: Normal breath sounds.  Abdominal:     Palpations: Abdomen is soft.     Tenderness: There is no abdominal tenderness.  Neurological:     General: No focal deficit present.     Mental Status: He is alert and oriented to person, place, and time.    Assessment & Plan:   1. Type 2 diabetes mellitus without complication, without long-term current use of insulin (HCC) A1c right at goal with dietary management only. Will prescribe metformin 500mg  daily. Monitor. Paperwork completed for DOT  - dapagliflozin propanediol (FARXIGA) 10 MG TABS tablet; Take 1 tablet (10 mg total) by mouth daily.  Dispense: 30 tablet; Refill: 2 - POCT glycosylated hemoglobin (Hb A1C) - Microalbumin / creatinine urine ratio  2. Essential hypertension, benign Appears stable. Continue present management and monitor.  3. Idiopathic gout, unspecified chronicity, unspecified site Appears stable. Meds refilled. Continue present management.  - colchicine 0.6 MG tablet; Take 1 tablet (0.6 mg total) by mouth daily as needed (gout flare).  Dispense: 30 tablet; Refill: 1  4. Class 3 severe obesity with serious comorbidity and body mass index (BMI) of 40.0 to 44.9 in adult, unspecified obesity type ([place]) Discussed dietary and activity options. Goal is 4-6lbs/mo wt loss.    Outpatient Encounter Medications as of  09/16/2021  Medication Sig   metFORMIN (GLUCOPHAGE XR) 500 MG 24 hr tablet Take 1 tablet (500 mg total) by mouth daily with breakfast.   mirtazapine (REMERON) 30 MG tablet Take 1 tablet (30 mg total) by mouth at bedtime.   acetaminophen (TYLENOL) 325 MG tablet Take 2 tablets (650 mg total) by mouth every 4 (four) hours as needed for headache or mild pain.   allopurinol (ZYLOPRIM) 100 MG tablet Take 1 tablet (100 mg total) by mouth daily.   colchicine 0.6 MG tablet Take 1 tablet (0.6 mg total) by mouth daily as needed (gout flare).   dapagliflozin propanediol (FARXIGA) 10 MG TABS tablet Take 1 tablet (10 mg total) by mouth daily.   diltiazem (CARDIZEM CD)  180 MG 24 hr capsule Take 1 capsule (180 mg total) by mouth daily.   dofetilide (TIKOSYN) 500 MCG capsule Take 1 capsule (500 mcg total) by mouth 2 (two) times daily.   furosemide (LASIX) 40 MG tablet Take 1 tablet by mouth twice daily   pantoprazole (PROTONIX) 40 MG tablet Take 1 tablet (40 mg total) by mouth daily.   potassium chloride SA (KLOR-CON M) 20 MEQ tablet Take 2 tablets (40 mEq total) by mouth 2 (two) times daily.   XARELTO 20 MG TABS tablet Take 1 tablet (20 mg total) by mouth daily with supper.   [DISCONTINUED] colchicine 0.6 MG tablet Take 1 tablet (0.6 mg total) by mouth daily as needed (gout flare).   [DISCONTINUED] dapagliflozin propanediol (FARXIGA) 10 MG TABS tablet Take 1 tablet (10 mg total) by mouth daily.   [DISCONTINUED] traZODone (DESYREL) 50 MG tablet Take 0.5-1 tablets (25-50 mg total) by mouth at bedtime as needed for sleep.   No facility-administered encounter medications on file as of 09/16/2021.    Follow-up: Return in about 6 months (around 03/16/2022) for follow up.   Becky Sax, MD

## 2021-09-18 LAB — MICROALBUMIN / CREATININE URINE RATIO
Creatinine, Urine: 32.4 mg/dL
Microalb/Creat Ratio: 81 mg/g creat — ABNORMAL HIGH (ref 0–29)
Microalbumin, Urine: 26.3 ug/mL

## 2021-09-20 ENCOUNTER — Encounter: Payer: Self-pay | Admitting: Cardiology

## 2021-09-20 ENCOUNTER — Ambulatory Visit (INDEPENDENT_AMBULATORY_CARE_PROVIDER_SITE_OTHER): Payer: Medicaid Other | Admitting: Cardiology

## 2021-09-20 ENCOUNTER — Other Ambulatory Visit: Payer: Self-pay

## 2021-09-20 VITALS — BP 144/78 | HR 99 | Ht 74.0 in | Wt 319.0 lb

## 2021-09-20 DIAGNOSIS — G4733 Obstructive sleep apnea (adult) (pediatric): Secondary | ICD-10-CM

## 2021-09-20 DIAGNOSIS — I4819 Other persistent atrial fibrillation: Secondary | ICD-10-CM | POA: Diagnosis not present

## 2021-09-20 DIAGNOSIS — I10 Essential (primary) hypertension: Secondary | ICD-10-CM

## 2021-09-20 MED ORDER — METOPROLOL SUCCINATE ER 100 MG PO TB24: 100.0000 mg | ORAL_TABLET | Freq: Every day | ORAL | 6 refills | Status: DC

## 2021-09-20 NOTE — Patient Instructions (Addendum)
Medication Instructions:  Your physician has recommended you make the following change in your medication:  STOP Diltiazem START Toprol 100 mg once daily  *If you need a refill on your cardiac medications before your next appointment, please call your pharmacy*   Lab Work: None ordered   Testing/Procedures: None ordered   Follow-Up: At [facility], you and your health needs are our priority.  As part of our continuing mission to provide you with exceptional heart care, we have created designated Provider Care Teams.  These Care Teams include your primary Cardiologist (physician) and Advanced Practice Providers (APPs -  Physician Assistants and Nurse Practitioners) who all work together to provide you with the care you need, when you need it.  Your next appointment:   6 month(s)  The format for your next appointment:   In Person  Provider:   Allegra Lai, MD    Thank you for choosing [place]!!   Trinidad Curet, RN [phone]

## 2021-09-20 NOTE — Progress Notes (Signed)
Electrophysiology Office Note   Date:  09/20/2021   ID:  Samuel Flynn, DOB 1975/09/26, MRN [id number]  PCP:  Dorna Mai, MD  Cardiologist:  Oval Linsey Primary Electrophysiologist:  Samuel Matlack Meredith Leeds, MD    Chief Complaint: AF   History of Present Illness: Samuel Flynn is a 46 y.o. male who is being seen today for the evaluation of AF at the request of Samuel Flynn*. Presenting today for electrophysiology evaluation.  He has a history significant for hypertension, obesity, OSA on CPAP, CKD, persistent atrial fibrillation.  He is status post ablation 04/26/2018.  He had more frequent episodes of atrial fibrillation and had a repeat ablation 11/07/2019.  He presented to the emergency room in atrial flutter 06/22/2021.  He was in one-to-one atrial flutter thought due to flecainide.  He had a cardioversion emergency room.  He has since been loaded on dofetilide.  Today, denies symptoms of palpitations, chest pain, shortness of breath, orthopnea, PND, lower extremity edema, claudication, dizziness, presyncope, syncope, bleeding, or neurologic sequela. The patient is tolerating medications without difficulties.  Since his dofetilide load he has done well.  He has had 1 episode of atrial fibrillation that lasted approximately 6 hours.  Aside from that, he has no major complaints.  He does state that his blood pressure has been elevated more recently, most of the time greater than 140, at times in the 170s.   Past Medical History:  Diagnosis Date   Atrial fibrillation with RVR ([place])    Gastric ulcer    Gout    Hypertension    Paroxysmal A-fib (HCC)    Sleep apnea    USES CPAP   Wears glasses    Past Surgical History:  Procedure Laterality Date   ATRIAL FIBRILLATION ABLATION N/A 04/26/2018   Procedure: ATRIAL FIBRILLATION ABLATION;  Surgeon: Constance Haw, MD;  Location: [place] [facility];  Service: Cardiovascular;  Laterality: N/A;   ATRIAL FIBRILLATION  ABLATION N/A 11/07/2019   Procedure: ATRIAL FIBRILLATION ABLATION;  Surgeon: Constance Haw, MD;  Location: [place] [facility];  Service: Cardiovascular;  Laterality: N/A;   CARDIOVERSION N/A 01/18/2018   Procedure: CARDIOVERSION;  Surgeon: Larey Dresser, MD;  Location: [facility] ENDOSCOPY;  Service: Cardiovascular;  Laterality: N/A;   CARDIOVERSION N/A 08/19/2019   Procedure: CARDIOVERSION;  Surgeon: Thayer Headings, MD;  Location: [facility] ENDOSCOPY;  Service: Cardiovascular;  Laterality: N/A;   CARDIOVERSION N/A 04/18/2021   Procedure: CARDIOVERSION;  Surgeon: Acie Fredrickson Wonda Cheng, MD;  Location: [facility] ENDOSCOPY;  Service: Cardiovascular;  Laterality: N/A;   ELBOW LIGAMENT RECONSTRUCTION Right 09/29/2014   Procedure: Merian Capron FRACTURE FIXATION, POSSIBLE LIGAMENT REPAIR;  Surgeon: Nita Sells, MD;  Location: [place];  Service: Orthopedics;  Laterality: Right;  Right elbow coranoid fracture fixation, possible ligament repair   TEE WITHOUT CARDIOVERSION N/A 01/18/2018   Procedure: TRANSESOPHAGEAL ECHOCARDIOGRAM (TEE);  Surgeon: Larey Dresser, MD;  Location: [facility] ENDOSCOPY;  Service: Cardiovascular;  Laterality: N/A;   TEE WITHOUT CARDIOVERSION N/A 04/18/2021   Procedure: TRANSESOPHAGEAL ECHOCARDIOGRAM (TEE);  Surgeon: Thayer Headings, MD;  Location: [facility] ENDOSCOPY;  Service: Cardiovascular;  Laterality: N/A;   WISDOM TOOTH EXTRACTION       Current Outpatient Medications  Medication Sig Dispense Refill   acetaminophen (TYLENOL) 325 MG tablet Take 2 tablets (650 mg total) by mouth every 4 (four) hours as needed for headache or mild pain.     allopurinol (ZYLOPRIM) 100 MG tablet Take 1 tablet (100 mg total) by  mouth daily. 90 tablet 3   colchicine 0.6 MG tablet Take 1 tablet (0.6 mg total) by mouth daily as needed (gout flare). 30 tablet 1   dapagliflozin propanediol (FARXIGA) 10 MG TABS tablet Take 1 tablet (10 mg total) by mouth daily. 30 tablet 2   diltiazem (CARDIZEM CD) 180 MG  24 hr capsule Take 1 capsule (180 mg total) by mouth daily. 30 capsule 2   dofetilide (TIKOSYN) 500 MCG capsule Take 1 capsule (500 mcg total) by mouth 2 (two) times daily. 60 capsule 5   furosemide (LASIX) 40 MG tablet Take 1 tablet by mouth twice daily 180 tablet 1   metFORMIN (GLUCOPHAGE XR) 500 MG 24 hr tablet Take 1 tablet (500 mg total) by mouth daily with breakfast. 90 tablet 1   mirtazapine (REMERON) 30 MG tablet Take 1 tablet (30 mg total) by mouth at bedtime. 30 tablet 3   pantoprazole (PROTONIX) 40 MG tablet Take 1 tablet (40 mg total) by mouth daily. 90 tablet 1   potassium chloride SA (KLOR-CON M) 20 MEQ tablet Take 2 tablets (40 mEq total) by mouth 2 (two) times daily. 120 tablet 2   XARELTO 20 MG TABS tablet Take 1 tablet (20 mg total) by mouth daily with supper. 30 tablet 2   No current facility-administered medications for this visit.    Allergies:   Patient has no known allergies.   Social History:  The patient  reports that he quit smoking about 2 months ago. His smoking use included cigars. He has Flynn used smokeless tobacco. He reports that he does not currently use alcohol. He reports that he does not use drugs.   Family History:  The patient's family history includes Heart attack in his father; Heart disease in his father; Hypertension in his father, maternal grandmother, mother, paternal uncle, paternal uncle, paternal uncle, paternal uncle, and paternal uncle; Stroke in his father.   ROS:  Please see the history of present illness.   Otherwise, review of systems is positive for none.   All other systems are reviewed and negative.   PHYSICAL EXAM: VS:  BP (!) 144/78    Pulse 99    Ht 6\' 2"  (1.88 m)    Wt (!) 319 lb (144.7 kg)    SpO2 94%    BMI 40.96 kg/m  , BMI Body mass index is 40.96 kg/m. GEN: Well nourished, well developed, in no acute distress  HEENT: normal  Neck: no JVD, carotid bruits, or masses Cardiac: RRR; no murmurs, rubs, or gallops,no edema   Respiratory:  clear to auscultation bilaterally, normal work of breathing GI: soft, nontender, nondistended, + BS MS: no deformity or atrophy  Skin: warm and dry Neuro:  Strength and sensation are intact Psych: euthymic mood, full affect  EKG:  EKG is ordered today. Personal review of the ekg ordered shows sinus rhythm, rate 99  Recent Labs: 04/15/2021: ALT 22 04/16/2021: TSH 2.620 06/22/2021: Hemoglobin 16.0; Platelets 312 08/24/2021: BUN 12; Creatinine, Ser 1.11; Magnesium 2.1; Potassium 4.1; Sodium 139    Lipid Panel     Component Value Date/Time   CHOL 203 (H) 04/16/2021 0413   TRIG 428 (H) 04/16/2021 0413   HDL 24 (L) 04/16/2021 0413   CHOLHDL 8.5 04/16/2021 0413   VLDL UNABLE TO CALCULATE IF TRIGLYCERIDE OVER 400 mg/dL 04/16/2021 0413   LDLCALC UNABLE TO CALCULATE IF TRIGLYCERIDE OVER 400 mg/dL 04/16/2021 0413   LDLDIRECT 107.0 (H) 04/16/2021 0413     Wt Readings from Last 3  Encounters:  09/20/21 (!) 319 lb (144.7 kg)  09/16/21 (!) 317 lb 12.8 oz (144.2 kg)  09/01/21 (!) 314 lb (142.4 kg)      Other studies Reviewed: Additional studies/ records that were reviewed today include: TTE 07/14/19  Review of the above records today demonstrates:   1. Left ventricular ejection fraction, by visual estimation, is 55 to 60%. The left ventricle has normal function. There is mildly increased left ventricular hypertrophy. Appears normal systolic function, though anterior wall is not well visualized  2. Left ventricular diastolic parameters are indeterminate.  3. Global right ventricle has normal systolic function.The right ventricular size is normal. No increase in right ventricular wall thickness.  4. Left atrial size was mildly dilated.  5. Right atrial size was normal.  6. The mitral valve is normal in structure. Mild mitral valve regurgitation.  7. The tricuspid valve is normal in structure. Tricuspid valve regurgitation is not demonstrated.  8. The aortic valve is  tricuspid. Aortic valve regurgitation is not visualized. No evidence of aortic valve sclerosis or stenosis.  9. The pulmonic valve was not well visualized. Pulmonic valve regurgitation is not visualized. 10. The inferior vena cava is dilated in size with >50% respiratory variability, suggesting right atrial pressure of 8 mmHg. 11. TR signal is inadequate for assessing pulmonary artery systolic pressure.   ASSESSMENT AND PLAN:  1.  Persistent atrial fibrillation: Currently not anticoagulated.  CHA2DS2-VASc of 1.  Status post ablation 04/26/2018 with repeat ablation 11/07/2019.  He failed flecainide with one-to-one flutter with heart rates in the 230s.  He has now status post dofetilide load.  Currently on 500 mcg twice daily.  High risk medication monitoring.  He has remained in sinus rhythm.  We Tyus Kallam continue with current management.  2.  Obstructive sleep apnea: CPAP compliance encouraged  3.  Hypertension: Blood pressure is elevated today.  He states that it is usually greater than 098 systolic.  We Victora Irby start him on overall XL 100 mg.  We Elyssa Pendelton stop diltiazem.   Current medicines are reviewed at length with the patient today.   The patient does not have concerns regarding his medicines.  The following changes were made today: Stop diltiazem, start Toprol-XL  Labs/ tests ordered today include:  Orders Placed This Encounter  Procedures   EKG 12-Lead      Disposition:   FU with Aodhan Scheidt 6 months  Signed, Eddy Liszewski Meredith Leeds, MD  09/20/2021 3:23 PM     [place] [address] [place] [address] [phone] (office) [phone] (fax)

## 2021-10-05 DIAGNOSIS — Z419 Encounter for procedure for purposes other than remedying health state, unspecified: Secondary | ICD-10-CM | POA: Diagnosis not present

## 2021-10-11 ENCOUNTER — Other Ambulatory Visit (HOSPITAL_COMMUNITY): Payer: Self-pay

## 2021-10-11 ENCOUNTER — Other Ambulatory Visit: Payer: Self-pay

## 2021-10-11 ENCOUNTER — Telehealth: Payer: Self-pay | Admitting: Cardiology

## 2021-10-11 NOTE — Telephone Encounter (Signed)
Called pt back in regards to paperwork.  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  Reports paperwork is due by the middle of february. Paperwork not in MD box.  Advised pt that message would be routed to MD and RN to follow up.

## 2021-10-11 NOTE — Telephone Encounter (Signed)
Patient is calling about some paper work that was filled out for him.

## 2021-10-13 NOTE — Telephone Encounter (Signed)
Pt made aware paperwork completed and faxed, confirmation received.

## 2021-10-13 NOTE — Telephone Encounter (Signed)
Pt made aware that I cannot find his paperwork. 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 Contacted [facility]/[address]..... they will fax over needed paperwork to complete and fax back.

## 2021-10-17 DIAGNOSIS — E7800 Pure hypercholesterolemia, unspecified: Secondary | ICD-10-CM | POA: Diagnosis not present

## 2021-10-17 DIAGNOSIS — E1169 Type 2 diabetes mellitus with other specified complication: Secondary | ICD-10-CM | POA: Diagnosis not present

## 2021-10-17 DIAGNOSIS — Z6841 Body Mass Index (BMI) 40.0 and over, adult: Secondary | ICD-10-CM | POA: Diagnosis not present

## 2021-10-17 DIAGNOSIS — I4891 Unspecified atrial fibrillation: Secondary | ICD-10-CM | POA: Diagnosis not present

## 2021-10-17 DIAGNOSIS — I10 Essential (primary) hypertension: Secondary | ICD-10-CM | POA: Diagnosis not present

## 2021-10-18 ENCOUNTER — Other Ambulatory Visit: Payer: Self-pay

## 2021-11-02 DIAGNOSIS — Z419 Encounter for procedure for purposes other than remedying health state, unspecified: Secondary | ICD-10-CM | POA: Diagnosis not present

## 2021-11-06 ENCOUNTER — Other Ambulatory Visit: Payer: Self-pay | Admitting: Student

## 2021-11-07 NOTE — Telephone Encounter (Signed)
This is Dr. [place]'s pt.  °

## 2021-11-11 DIAGNOSIS — L918 Other hypertrophic disorders of the skin: Secondary | ICD-10-CM | POA: Diagnosis not present

## 2021-11-11 DIAGNOSIS — L853 Xerosis cutis: Secondary | ICD-10-CM | POA: Diagnosis not present

## 2021-12-02 ENCOUNTER — Telehealth: Payer: Self-pay | Admitting: Cardiology

## 2021-12-02 MED ORDER — XARELTO 20 MG PO TABS: 20.0000 mg | ORAL_TABLET | Freq: Every day | ORAL | 1 refills | Status: DC

## 2021-12-02 NOTE — Telephone Encounter (Signed)
?*  STAT* If patient is at the pharmacy, call can be transferred to refill team. ? ? ?1. Which medications need to be refilled? (please list name of each medication and dose if known)  ?XARELTO 20 MG TABS tablet ? ?2. Which pharmacy/location (including street and city if local pharmacy) is medication to be sent to? ?[place] ([place] ? ?3. Do they need a 30 day or 90 day supply? 90 with refills ? ?Patient said his medication has not been going to [place]  ?

## 2021-12-02 NOTE — Telephone Encounter (Signed)
Prescription refill request for Xarelto received.  ? ?Indication: afib  ?Last office visit: [place], 09/20/2021 ?Weight: 144.7 kg  ?Age: 46 yo  ?Scr: 1.11, 08/16/2021 ?CrCl: 167 ml/min  ? ?Refill sent.  ?

## 2021-12-03 DIAGNOSIS — Z419 Encounter for procedure for purposes other than remedying health state, unspecified: Secondary | ICD-10-CM | POA: Diagnosis not present

## 2021-12-11 ENCOUNTER — Other Ambulatory Visit: Payer: Self-pay | Admitting: Student

## 2021-12-12 NOTE — Telephone Encounter (Signed)
This is Dr. Lorane's pt.  °

## 2021-12-19 DIAGNOSIS — Z6841 Body Mass Index (BMI) 40.0 and over, adult: Secondary | ICD-10-CM | POA: Diagnosis not present

## 2021-12-19 DIAGNOSIS — E1169 Type 2 diabetes mellitus with other specified complication: Secondary | ICD-10-CM | POA: Diagnosis not present

## 2021-12-19 DIAGNOSIS — I10 Essential (primary) hypertension: Secondary | ICD-10-CM | POA: Diagnosis not present

## 2021-12-22 DIAGNOSIS — Z9889 Other specified postprocedural states: Secondary | ICD-10-CM | POA: Diagnosis not present

## 2021-12-22 DIAGNOSIS — G4733 Obstructive sleep apnea (adult) (pediatric): Secondary | ICD-10-CM | POA: Diagnosis not present

## 2021-12-22 DIAGNOSIS — I10 Essential (primary) hypertension: Secondary | ICD-10-CM | POA: Diagnosis not present

## 2021-12-22 DIAGNOSIS — F17200 Nicotine dependence, unspecified, uncomplicated: Secondary | ICD-10-CM | POA: Insufficient documentation

## 2021-12-22 DIAGNOSIS — E119 Type 2 diabetes mellitus without complications: Secondary | ICD-10-CM | POA: Insufficient documentation

## 2021-12-22 DIAGNOSIS — F17210 Nicotine dependence, cigarettes, uncomplicated: Secondary | ICD-10-CM | POA: Diagnosis not present

## 2021-12-22 DIAGNOSIS — M109 Gout, unspecified: Secondary | ICD-10-CM | POA: Diagnosis not present

## 2021-12-22 DIAGNOSIS — I509 Heart failure, unspecified: Secondary | ICD-10-CM | POA: Diagnosis not present

## 2021-12-22 DIAGNOSIS — Z794 Long term (current) use of insulin: Secondary | ICD-10-CM | POA: Diagnosis not present

## 2021-12-22 DIAGNOSIS — I4891 Unspecified atrial fibrillation: Secondary | ICD-10-CM | POA: Diagnosis not present

## 2021-12-26 ENCOUNTER — Other Ambulatory Visit: Payer: Self-pay | Admitting: Family Medicine

## 2021-12-26 DIAGNOSIS — M1000 Idiopathic gout, unspecified site: Secondary | ICD-10-CM

## 2022-01-01 ENCOUNTER — Other Ambulatory Visit: Payer: Self-pay | Admitting: Family Medicine

## 2022-01-01 DIAGNOSIS — M1000 Idiopathic gout, unspecified site: Secondary | ICD-10-CM

## 2022-01-02 DIAGNOSIS — Z419 Encounter for procedure for purposes other than remedying health state, unspecified: Secondary | ICD-10-CM | POA: Diagnosis not present

## 2022-01-03 DIAGNOSIS — F17210 Nicotine dependence, cigarettes, uncomplicated: Secondary | ICD-10-CM | POA: Diagnosis not present

## 2022-01-03 DIAGNOSIS — Z6841 Body Mass Index (BMI) 40.0 and over, adult: Secondary | ICD-10-CM | POA: Diagnosis not present

## 2022-01-03 DIAGNOSIS — F54 Psychological and behavioral factors associated with disorders or diseases classified elsewhere: Secondary | ICD-10-CM | POA: Diagnosis not present

## 2022-01-03 DIAGNOSIS — S82121A Displaced fracture of lateral condyle of right tibia, initial encounter for closed fracture: Secondary | ICD-10-CM | POA: Diagnosis not present

## 2022-01-03 DIAGNOSIS — S82141A Displaced bicondylar fracture of right tibia, initial encounter for closed fracture: Secondary | ICD-10-CM | POA: Diagnosis not present

## 2022-01-03 DIAGNOSIS — Z7189 Other specified counseling: Secondary | ICD-10-CM | POA: Diagnosis not present

## 2022-01-04 DIAGNOSIS — S82121A Displaced fracture of lateral condyle of right tibia, initial encounter for closed fracture: Secondary | ICD-10-CM | POA: Insufficient documentation

## 2022-01-04 DIAGNOSIS — Z720 Tobacco use: Secondary | ICD-10-CM | POA: Insufficient documentation

## 2022-01-09 DIAGNOSIS — F17210 Nicotine dependence, cigarettes, uncomplicated: Secondary | ICD-10-CM | POA: Diagnosis not present

## 2022-01-09 DIAGNOSIS — I110 Hypertensive heart disease with heart failure: Secondary | ICD-10-CM | POA: Diagnosis not present

## 2022-01-09 DIAGNOSIS — Z79899 Other long term (current) drug therapy: Secondary | ICD-10-CM | POA: Diagnosis not present

## 2022-01-09 DIAGNOSIS — I509 Heart failure, unspecified: Secondary | ICD-10-CM | POA: Diagnosis not present

## 2022-01-09 DIAGNOSIS — G4730 Sleep apnea, unspecified: Secondary | ICD-10-CM | POA: Diagnosis not present

## 2022-01-09 DIAGNOSIS — Z6838 Body mass index (BMI) 38.0-38.9, adult: Secondary | ICD-10-CM | POA: Diagnosis not present

## 2022-01-09 DIAGNOSIS — Z01818 Encounter for other preprocedural examination: Secondary | ICD-10-CM | POA: Diagnosis not present

## 2022-01-09 DIAGNOSIS — K219 Gastro-esophageal reflux disease without esophagitis: Secondary | ICD-10-CM | POA: Diagnosis not present

## 2022-01-09 DIAGNOSIS — I10 Essential (primary) hypertension: Secondary | ICD-10-CM | POA: Diagnosis not present

## 2022-01-09 DIAGNOSIS — S82121D Displaced fracture of lateral condyle of right tibia, subsequent encounter for closed fracture with routine healing: Secondary | ICD-10-CM | POA: Diagnosis not present

## 2022-01-09 DIAGNOSIS — Z6841 Body Mass Index (BMI) 40.0 and over, adult: Secondary | ICD-10-CM | POA: Diagnosis not present

## 2022-01-09 DIAGNOSIS — Z7984 Long term (current) use of oral hypoglycemic drugs: Secondary | ICD-10-CM | POA: Diagnosis not present

## 2022-01-09 DIAGNOSIS — S82121A Displaced fracture of lateral condyle of right tibia, initial encounter for closed fracture: Secondary | ICD-10-CM | POA: Diagnosis not present

## 2022-01-09 DIAGNOSIS — E119 Type 2 diabetes mellitus without complications: Secondary | ICD-10-CM | POA: Diagnosis not present

## 2022-01-09 DIAGNOSIS — G4733 Obstructive sleep apnea (adult) (pediatric): Secondary | ICD-10-CM | POA: Diagnosis not present

## 2022-01-09 DIAGNOSIS — K2950 Unspecified chronic gastritis without bleeding: Secondary | ICD-10-CM | POA: Diagnosis not present

## 2022-01-09 DIAGNOSIS — I4891 Unspecified atrial fibrillation: Secondary | ICD-10-CM | POA: Diagnosis not present

## 2022-01-12 DIAGNOSIS — Z7984 Long term (current) use of oral hypoglycemic drugs: Secondary | ICD-10-CM | POA: Diagnosis not present

## 2022-01-12 DIAGNOSIS — G8918 Other acute postprocedural pain: Secondary | ICD-10-CM | POA: Diagnosis not present

## 2022-01-12 DIAGNOSIS — I4891 Unspecified atrial fibrillation: Secondary | ICD-10-CM | POA: Diagnosis not present

## 2022-01-12 DIAGNOSIS — Z79899 Other long term (current) drug therapy: Secondary | ICD-10-CM | POA: Diagnosis not present

## 2022-01-12 DIAGNOSIS — I10 Essential (primary) hypertension: Secondary | ICD-10-CM | POA: Diagnosis not present

## 2022-01-12 DIAGNOSIS — S82121A Displaced fracture of lateral condyle of right tibia, initial encounter for closed fracture: Secondary | ICD-10-CM | POA: Diagnosis not present

## 2022-01-12 DIAGNOSIS — F17210 Nicotine dependence, cigarettes, uncomplicated: Secondary | ICD-10-CM | POA: Diagnosis not present

## 2022-01-12 DIAGNOSIS — S82121D Displaced fracture of lateral condyle of right tibia, subsequent encounter for closed fracture with routine healing: Secondary | ICD-10-CM | POA: Diagnosis not present

## 2022-01-12 DIAGNOSIS — K219 Gastro-esophageal reflux disease without esophagitis: Secondary | ICD-10-CM | POA: Diagnosis not present

## 2022-01-12 DIAGNOSIS — S82141A Displaced bicondylar fracture of right tibia, initial encounter for closed fracture: Secondary | ICD-10-CM | POA: Diagnosis not present

## 2022-01-12 DIAGNOSIS — E119 Type 2 diabetes mellitus without complications: Secondary | ICD-10-CM | POA: Diagnosis not present

## 2022-01-12 DIAGNOSIS — G4733 Obstructive sleep apnea (adult) (pediatric): Secondary | ICD-10-CM | POA: Diagnosis not present

## 2022-01-26 DIAGNOSIS — S82121A Displaced fracture of lateral condyle of right tibia, initial encounter for closed fracture: Secondary | ICD-10-CM | POA: Diagnosis not present

## 2022-01-27 DIAGNOSIS — Z4789 Encounter for other orthopedic aftercare: Secondary | ICD-10-CM | POA: Insufficient documentation

## 2022-02-02 DIAGNOSIS — Z419 Encounter for procedure for purposes other than remedying health state, unspecified: Secondary | ICD-10-CM | POA: Diagnosis not present

## 2022-02-04 ENCOUNTER — Other Ambulatory Visit: Payer: Self-pay | Admitting: Family Medicine

## 2022-02-04 ENCOUNTER — Other Ambulatory Visit: Payer: Self-pay | Admitting: Student

## 2022-02-04 ENCOUNTER — Other Ambulatory Visit: Payer: Self-pay | Admitting: Cardiology

## 2022-02-04 DIAGNOSIS — M1000 Idiopathic gout, unspecified site: Secondary | ICD-10-CM

## 2022-02-04 NOTE — Telephone Encounter (Signed)
Refilled per patient request. 

## 2022-02-10 DIAGNOSIS — S82121A Displaced fracture of lateral condyle of right tibia, initial encounter for closed fracture: Secondary | ICD-10-CM | POA: Diagnosis not present

## 2022-02-17 ENCOUNTER — Other Ambulatory Visit: Payer: Self-pay | Admitting: Family

## 2022-02-17 DIAGNOSIS — K219 Gastro-esophageal reflux disease without esophagitis: Secondary | ICD-10-CM

## 2022-02-17 NOTE — Telephone Encounter (Signed)
Requested Prescriptions  Pending Prescriptions Disp Refills  . pantoprazole (PROTONIX) 40 MG tablet [Pharmacy Med Name: Pantoprazole Sodium 40 MG Oral Tablet Delayed Release] 90 tablet 0    Sig: Take 1 tablet by mouth once daily     Gastroenterology: Proton Pump Inhibitors Passed - 02/17/2022  9:21 AM      Passed - Valid encounter within last 12 months    Recent Outpatient Visits          5 months ago Type 2 diabetes mellitus without complication, without long-term current use of insulin ([place])   Primary Care at [facility], MD   5 months ago Hospital discharge follow-up   Primary Care at [facility] [place], NP   1 year ago Essential hypertension, benign   Primary Care at [facility], Amy J, NP   1 year ago Idiopathic gout, unspecified chronicity, unspecified site   Primary Care at [facility], Bayard Beaver, MD   1 year ago Idiopathic gout, unspecified chronicity, unspecified site   Primary Care at [facility], Loraine Grip, PA-C      Future Appointments            In 3 weeks Dorna Mai, MD Primary Care at [facility]

## 2022-03-02 ENCOUNTER — Other Ambulatory Visit: Payer: Self-pay | Admitting: Family Medicine

## 2022-03-02 NOTE — Telephone Encounter (Signed)
Requested Prescriptions  Pending Prescriptions Disp Refills  . metFORMIN (GLUCOPHAGE-XR) 500 MG 24 hr tablet [Pharmacy Med Name: metFORMIN HCl ER 500 MG Oral Tablet Extended Release 24 Hour] 90 tablet 0    Sig: Take 1 tablet by mouth once daily with breakfast     Endocrinology:  Diabetes - Biguanides Failed - 03/02/2022  9:02 AM      Failed - B12 Level in normal range and within 720 days    Vitamin B-12  Date Value Ref Range Status  01/09/2007 333 211 - 911 pg/mL Final         Failed - CBC within normal limits and completed in the last 12 months    WBC  Date Value Ref Range Status  06/22/2021 7.5 4.0 - 10.5 K/uL Final   RBC  Date Value Ref Range Status  06/22/2021 5.95 (H) 4.22 - 5.81 MIL/uL Final   Hemoglobin  Date Value Ref Range Status  06/22/2021 16.0 13.0 - 17.0 g/dL Final  08/11/2019 13.6 13.0 - 17.7 g/dL Final   HCT  Date Value Ref Range Status  06/22/2021 48.3 39.0 - 52.0 % Final   Hematocrit  Date Value Ref Range Status  08/11/2019 41.1 37.5 - 51.0 % Final   MCHC  Date Value Ref Range Status  06/22/2021 33.1 30.0 - 36.0 g/dL Final   [facility]  Date Value Ref Range Status  06/22/2021 26.9 26.0 - 34.0 pg Final   MCV  Date Value Ref Range Status  06/22/2021 81.2 80.0 - 100.0 fL Final  08/11/2019 84 79 - 97 fL Final   No results found for: "PLTCOUNTKUC", "LABPLAT", "POCPLA" RDW  Date Value Ref Range Status  06/22/2021 19.1 (H) 11.5 - 15.5 % Final  08/11/2019 18.3 (H) 11.6 - 15.4 % Final         Passed - Cr in normal range and within 360 days    Creatinine, Ser  Date Value Ref Range Status  08/24/2021 1.11 0.61 - 1.24 mg/dL Final         Passed - HBA1C is between 0 and 7.9 and within 180 days    Hemoglobin A1C  Date Value Ref Range Status  09/16/2021 7.0 (A) 4.0 - 5.6 % Final   Hgb A1c MFr Bld  Date Value Ref Range Status  04/15/2021 6.9 (H) 4.8 - 5.6 % Final    Comment:    REPEATED TO VERIFY (NOTE) Pre diabetes:          5.7%-6.4%  Diabetes:               >6.4%  Glycemic control for   <7.0% adults with diabetes          Passed - eGFR in normal range and within 360 days    GFR calc Af Amer  Date Value Ref Range Status  01/28/2020 >60 >60 mL/min Final   GFR, Estimated  Date Value Ref Range Status  08/24/2021 >60 >60 mL/min Final    Comment:    (NOTE) Calculated using the CKD-EPI Creatinine Equation (2021)          Passed - Valid encounter within last 6 months    Recent Outpatient Visits          5 months ago Type 2 diabetes mellitus without complication, without long-term current use of insulin ([place])   Primary Care at [facility], MD   6 months ago Hospital discharge follow-up   Primary Care at [facility], Flonnie Hailstone, NP  1 year ago Essential hypertension, benign   Primary Care at [facility], Amy J, NP   1 year ago Idiopathic gout, unspecified chronicity, unspecified site   Primary Care at [facility], Bayard Beaver, MD   1 year ago Idiopathic gout, unspecified chronicity, unspecified site   Primary Care at [facility], Loraine Grip, PA-C      Future Appointments            In 2 weeks Dorna Mai, MD Primary Care at [facility]

## 2022-03-16 ENCOUNTER — Encounter: Payer: Self-pay | Admitting: Family Medicine

## 2022-03-16 ENCOUNTER — Ambulatory Visit (INDEPENDENT_AMBULATORY_CARE_PROVIDER_SITE_OTHER): Payer: Medicaid Other | Admitting: Family Medicine

## 2022-03-16 VITALS — BP 131/82 | HR 50 | Temp 98.0°F | Resp 16 | Wt 295.2 lb

## 2022-03-16 DIAGNOSIS — L918 Other hypertrophic disorders of the skin: Secondary | ICD-10-CM

## 2022-03-16 DIAGNOSIS — E119 Type 2 diabetes mellitus without complications: Secondary | ICD-10-CM

## 2022-03-16 DIAGNOSIS — I10 Essential (primary) hypertension: Secondary | ICD-10-CM | POA: Diagnosis not present

## 2022-03-18 ENCOUNTER — Encounter: Payer: Self-pay | Admitting: Family Medicine

## 2022-03-18 NOTE — Progress Notes (Signed)
Established Patient Office Visit  Subjective    Patient ID: Samuel Flynn, male    DOB: 12/16/1975  Age: 46 y.o. MRN: [id number]  CC:  Chief Complaint  Patient presents with   Follow-up    HPI [name] presents for routine follow up of diabetes and hypertension. Patient denies acute complaints or concerns.    Outpatient Encounter Medications as of 03/16/2022  Medication Sig   acetaminophen (TYLENOL) 325 MG tablet Take 2 tablets (650 mg total) by mouth every 4 (four) hours as needed for headache or mild pain.   allopurinol (ZYLOPRIM) 100 MG tablet Take 1 tablet (100 mg total) by mouth daily.   colchicine 0.6 MG tablet TAKE 1 TABLET BY MOUTH ONCE DAILY AS NEEDED (GOUT  FLARE)   dapagliflozin propanediol (FARXIGA) 10 MG TABS tablet Take 1 tablet (10 mg total) by mouth daily.   diltiazem (CARDIZEM CD) 180 MG 24 hr capsule Take 1 capsule (180 mg total) by mouth daily.   dofetilide (TIKOSYN) 500 MCG capsule Take 1 capsule by mouth twice daily   flecainide (TAMBOCOR) 100 MG tablet Take by mouth.   furosemide (LASIX) 40 MG tablet Take 1 tablet by mouth twice daily   metFORMIN (GLUCOPHAGE-XR) 500 MG 24 hr tablet Take 1 tablet by mouth once daily with breakfast   metoprolol succinate (TOPROL-XL) 100 MG 24 hr tablet Take 1 tablet (100 mg total) by mouth daily. Take with or immediately following a meal.   mirtazapine (REMERON) 30 MG tablet Take 1 tablet (30 mg total) by mouth at bedtime.   pantoprazole (PROTONIX) 40 MG tablet Take 1 tablet by mouth once daily   potassium chloride SA (KLOR-CON M) 20 MEQ tablet Take 2 tablets by mouth twice daily   spironolactone (ALDACTONE) 25 MG tablet spironolactone 25 mg tabs   XARELTO 20 MG TABS tablet Take 1 tablet (20 mg total) by mouth daily with supper.   [DISCONTINUED] diltiazem (TIAZAC) 180 MG 24 hr capsule Take by mouth.   [DISCONTINUED] metFORMIN (GLUCOPHAGE-XR) 500 MG 24 hr tablet Take by mouth.   [DISCONTINUED] potassium chloride  (KLOR-CON) 20 MEQ packet    No facility-administered encounter medications on file as of 03/16/2022.    Past Medical History:  Diagnosis Date   Atrial fibrillation with RVR (HCC)    Gastric ulcer    Gout    Hypertension    Paroxysmal A-fib (HCC)    Sleep apnea    USES CPAP   Wears glasses     Past Surgical History:  Procedure Laterality Date   ATRIAL FIBRILLATION ABLATION N/A 04/26/2018   Procedure: ATRIAL FIBRILLATION ABLATION;  Surgeon: Regan Lemming, MD;  Location: [facility];  Service: Cardiovascular;  Laterality: N/A;   ATRIAL FIBRILLATION ABLATION N/A 11/07/2019   Procedure: ATRIAL FIBRILLATION ABLATION;  Surgeon: Regan Lemming, MD;  Location: [facility];  Service: Cardiovascular;  Laterality: N/A;   CARDIOVERSION N/A 01/18/2018   Procedure: CARDIOVERSION;  Surgeon: Laurey Morale, MD;  Location: [facility] ENDOSCOPY;  Service: Cardiovascular;  Laterality: N/A;   CARDIOVERSION N/A 08/19/2019   Procedure: CARDIOVERSION;  Surgeon: Vesta Mixer, MD;  Location: [facility] ENDOSCOPY;  Service: Cardiovascular;  Laterality: N/A;   CARDIOVERSION N/A 04/18/2021   Procedure: CARDIOVERSION;  Surgeon: Elease Hashimoto Deloris Ping, MD;  Location: [facility] ENDOSCOPY;  Service: Cardiovascular;  Laterality: N/A;   ELBOW LIGAMENT RECONSTRUCTION Right 09/29/2014   Procedure: Tomma Lightning FRACTURE FIXATION, POSSIBLE LIGAMENT REPAIR;  Surgeon: Mable Paris, MD;  Location: [facility];  Service: Orthopedics;  Laterality: Right;  Right elbow coranoid fracture fixation, possible ligament repair   TEE WITHOUT CARDIOVERSION N/A 01/18/2018   Procedure: TRANSESOPHAGEAL ECHOCARDIOGRAM (TEE);  Surgeon: Laurey Morale, MD;  Location: [facility] ENDOSCOPY;  Service: Cardiovascular;  Laterality: N/A;   TEE WITHOUT CARDIOVERSION N/A 04/18/2021   Procedure: TRANSESOPHAGEAL ECHOCARDIOGRAM (TEE);  Surgeon: Elease Hashimoto, Deloris Ping, MD;  Location: [facility] ENDOSCOPY;  Service: Cardiovascular;  Laterality: N/A;    WISDOM TOOTH EXTRACTION      Family History  Problem Relation Age of Onset   Hypertension Paternal Uncle    Hypertension Maternal Grandmother    Hypertension Paternal Uncle    Hypertension Paternal Uncle    Hypertension Paternal Uncle    Hypertension Paternal Uncle    Hypertension Mother    Stroke Father    Heart disease Father    Heart attack Father    Hypertension Father    Colon cancer Neg Hx     Social History   Socioeconomic History   Marital status: Married    Spouse name: Not on file   Number of children: 4   Years of education: 10   Highest education level: Not on file  Occupational History   Occupation: Truck Air traffic controller: [employer]  Tobacco Use   Smoking status: Former    Types: Cigars    Quit date: 07/20/2021    Years since quitting: 0.6   Smokeless tobacco: Never   Tobacco comments:    Former smoker 08/16/21  Vaping Use   Vaping Use: Never used  Substance and Sexual Activity   Alcohol use: Not Currently    Comment: occ   Drug use: No   Sexual activity: Yes    Comment: 5 a day  Other Topics Concern   Not on file  Social History Narrative   Not on file   Social Determinants of Health   Financial Resource Strain: High Risk (06/28/2021)   Overall Financial Resource Strain (CARDIA)    Difficulty of Paying Living Expenses: Hard  Food Insecurity: No Food Insecurity (06/28/2021)   Hunger Vital Sign    Worried About Running Out of Food in the Last Year: Never true    Ran Out of Food in the Last Year: Never true  Transportation Needs: No Transportation Needs (06/28/2021)   PRAPARE - Administrator, Civil Service (Medical): No    Lack of Transportation (Non-Medical): No  Physical Activity: Not on file  Stress: Not on file  Social Connections: Not on file  Intimate Partner Violence: Not on file    Review of Systems  All other systems reviewed and are negative.       Objective    BP 131/82   Pulse (!) 50   Temp  98 F (36.7 C) (Oral)   Resp 16   Wt 295 lb 3.2 oz (133.9 kg)   BMI 37.90 kg/m   Physical Exam Vitals and nursing note reviewed.  Constitutional:      General: He is not in acute distress.    Appearance: He is obese.  Cardiovascular:     Rate and Rhythm: Normal rate and regular rhythm.  Pulmonary:     Effort: Pulmonary effort is normal.     Breath sounds: Normal breath sounds.  Abdominal:     Palpations: Abdomen is soft.     Tenderness: There is no abdominal tenderness.  Neurological:     General: No focal deficit present.  Mental Status: He is alert and oriented to person, place, and time.         Assessment & Plan:   1. Type 2 diabetes mellitus without complication, without long-term current use of insulin (HCC) Improved A1c and now at goal. Continue present management and monitor - POCT glycosylated hemoglobin (Hb A1C)  2. Essential hypertension, benign Appears stable. Continue and monitor.   3. Skin tag Referral for dermatology - Ambulatory referral to Dermatology    Return in about 6 months (around 09/16/2022) for follow up.   Tommie Raymond, MD

## 2022-03-20 LAB — POCT GLYCOSYLATED HEMOGLOBIN (HGB A1C): Hemoglobin A1C: 6.1 % — AB (ref 4.0–5.6)

## 2022-03-21 DIAGNOSIS — F17210 Nicotine dependence, cigarettes, uncomplicated: Secondary | ICD-10-CM | POA: Diagnosis not present

## 2022-03-21 DIAGNOSIS — K449 Diaphragmatic hernia without obstruction or gangrene: Secondary | ICD-10-CM | POA: Insufficient documentation

## 2022-03-21 DIAGNOSIS — I4891 Unspecified atrial fibrillation: Secondary | ICD-10-CM | POA: Diagnosis not present

## 2022-03-21 DIAGNOSIS — I10 Essential (primary) hypertension: Secondary | ICD-10-CM | POA: Diagnosis not present

## 2022-03-21 DIAGNOSIS — E119 Type 2 diabetes mellitus without complications: Secondary | ICD-10-CM | POA: Diagnosis not present

## 2022-03-21 DIAGNOSIS — M109 Gout, unspecified: Secondary | ICD-10-CM | POA: Diagnosis not present

## 2022-03-21 DIAGNOSIS — G4733 Obstructive sleep apnea (adult) (pediatric): Secondary | ICD-10-CM | POA: Diagnosis not present

## 2022-03-21 DIAGNOSIS — Z8679 Personal history of other diseases of the circulatory system: Secondary | ICD-10-CM | POA: Diagnosis not present

## 2022-03-21 DIAGNOSIS — Z9889 Other specified postprocedural states: Secondary | ICD-10-CM | POA: Diagnosis not present

## 2022-03-22 DIAGNOSIS — R5381 Other malaise: Secondary | ICD-10-CM | POA: Diagnosis not present

## 2022-03-22 DIAGNOSIS — M6281 Muscle weakness (generalized): Secondary | ICD-10-CM | POA: Diagnosis not present

## 2022-03-22 DIAGNOSIS — I4891 Unspecified atrial fibrillation: Secondary | ICD-10-CM | POA: Diagnosis not present

## 2022-03-22 DIAGNOSIS — Z01818 Encounter for other preprocedural examination: Secondary | ICD-10-CM | POA: Diagnosis not present

## 2022-03-22 DIAGNOSIS — I10 Essential (primary) hypertension: Secondary | ICD-10-CM | POA: Diagnosis not present

## 2022-03-22 DIAGNOSIS — Z8679 Personal history of other diseases of the circulatory system: Secondary | ICD-10-CM | POA: Diagnosis not present

## 2022-03-22 DIAGNOSIS — F17210 Nicotine dependence, cigarettes, uncomplicated: Secondary | ICD-10-CM | POA: Diagnosis not present

## 2022-03-22 DIAGNOSIS — E669 Obesity, unspecified: Secondary | ICD-10-CM | POA: Diagnosis not present

## 2022-03-22 DIAGNOSIS — E119 Type 2 diabetes mellitus without complications: Secondary | ICD-10-CM | POA: Diagnosis not present

## 2022-03-22 DIAGNOSIS — Z9889 Other specified postprocedural states: Secondary | ICD-10-CM | POA: Diagnosis not present

## 2022-03-22 DIAGNOSIS — R6889 Other general symptoms and signs: Secondary | ICD-10-CM | POA: Diagnosis not present

## 2022-04-11 DIAGNOSIS — Z713 Dietary counseling and surveillance: Secondary | ICD-10-CM | POA: Diagnosis not present

## 2022-04-15 ENCOUNTER — Other Ambulatory Visit: Payer: Self-pay | Admitting: Family

## 2022-04-15 DIAGNOSIS — M1000 Idiopathic gout, unspecified site: Secondary | ICD-10-CM

## 2022-04-15 DIAGNOSIS — E119 Type 2 diabetes mellitus without complications: Secondary | ICD-10-CM

## 2022-04-25 ENCOUNTER — Encounter: Payer: Self-pay | Admitting: Family Medicine

## 2022-04-25 ENCOUNTER — Ambulatory Visit (INDEPENDENT_AMBULATORY_CARE_PROVIDER_SITE_OTHER): Payer: Medicaid Other | Admitting: Family Medicine

## 2022-04-25 VITALS — BP 134/79 | HR 60 | Temp 97.7°F | Resp 16 | Ht 74.0 in | Wt 301.2 lb

## 2022-04-25 DIAGNOSIS — E119 Type 2 diabetes mellitus without complications: Secondary | ICD-10-CM | POA: Diagnosis not present

## 2022-04-25 DIAGNOSIS — M109 Gout, unspecified: Secondary | ICD-10-CM | POA: Diagnosis not present

## 2022-04-25 DIAGNOSIS — Z7901 Long term (current) use of anticoagulants: Secondary | ICD-10-CM | POA: Insufficient documentation

## 2022-04-25 DIAGNOSIS — Z6841 Body Mass Index (BMI) 40.0 and over, adult: Secondary | ICD-10-CM | POA: Diagnosis not present

## 2022-04-25 DIAGNOSIS — G4733 Obstructive sleep apnea (adult) (pediatric): Secondary | ICD-10-CM | POA: Diagnosis not present

## 2022-04-25 DIAGNOSIS — I4891 Unspecified atrial fibrillation: Secondary | ICD-10-CM | POA: Diagnosis not present

## 2022-04-25 DIAGNOSIS — I10 Essential (primary) hypertension: Secondary | ICD-10-CM

## 2022-04-25 DIAGNOSIS — K449 Diaphragmatic hernia without obstruction or gangrene: Secondary | ICD-10-CM | POA: Diagnosis not present

## 2022-04-25 DIAGNOSIS — F17210 Nicotine dependence, cigarettes, uncomplicated: Secondary | ICD-10-CM | POA: Diagnosis not present

## 2022-04-25 DIAGNOSIS — Z9229 Personal history of other drug therapy: Secondary | ICD-10-CM | POA: Diagnosis not present

## 2022-04-25 DIAGNOSIS — E66813 Obesity, class 3: Secondary | ICD-10-CM

## 2022-04-25 DIAGNOSIS — Z9889 Other specified postprocedural states: Secondary | ICD-10-CM | POA: Diagnosis not present

## 2022-04-26 ENCOUNTER — Encounter: Payer: Self-pay | Admitting: Family Medicine

## 2022-04-26 ENCOUNTER — Telehealth: Payer: Self-pay | Admitting: *Deleted

## 2022-04-26 NOTE — Telephone Encounter (Signed)
Patient with diagnosis of afib on Xarelto for anticoagulation.    Procedure: laparoscopic bariatric surgery Date of procedure: TBD  CHA2DS2-VASc Score = 3  This indicates a 3.2% annual risk of stroke. The patient's score is based upon: CHF History: 1 HTN History: 1 Diabetes History: 1 Stroke History: 0 Vascular Disease History: 0 Age Score: 0 Gender Score: 0   CrCl >132m/min Platelet count 312K  Per office protocol, patient can hold Xarelto for 2-3 days prior to procedure. Of note, DOACs are not preferred in patients after bariatric surgery due to decreased efficacy/absorption. Ideally would need to be changed to warfarin post-procedure when anticoagulation is resumed.  **This guidance is not considered finalized until pre-operative APP has relayed final recommendations.**

## 2022-04-26 NOTE — Telephone Encounter (Signed)
   Pre-operative Risk Assessment    Patient Name: Samuel Flynn  DOB: May 03, 1976 MRN: [id number]      Request for Surgical Clearance    Procedure:   LAPAROSCOPIC BARIATRIC SURGERY  Date of Surgery:  Clearance TBD                                  Surgeon:  DR. Norris Cross Surgeon's Group or Practice Name:  [facility] Phone number:  [phone] Fax number:  [phone]   Type of Clearance Requested:   - Medical  - Pharmacy:  Hold Rivaroxaban (Xarelto)     Type of Anesthesia:  General    Additional requests/questions:    Jiles Prows   04/26/2022, 9:44 AM

## 2022-04-26 NOTE — Telephone Encounter (Signed)
Recommendations for holding Xarelto have been made.  Preoperative team, please contact this patient and set up a phone call appointment for further cardiac evaluation.  Thank you for your help.  Jossie Ng. Dhamar Gregory NP-C    04/26/2022, 1:39 PM [facility] [place] [address] Office [phone] Fax [phone]

## 2022-04-26 NOTE — Progress Notes (Signed)
Established Patient Office Visit  Subjective    Patient ID: Samuel Flynn, male    DOB: March 25, 1976  Age: 46 y.o. MRN: [id number]  CC: No chief complaint on file.   HPI Samuel Flynn presents for routine follow up of chronic med issues including diabetes and hypertension. Patient denies acute complaints or concerns.    Outpatient Encounter Medications as of 04/25/2022  Medication Sig   acetaminophen (TYLENOL) 325 MG tablet Take 2 tablets (650 mg total) by mouth every 4 (four) hours as needed for headache or mild pain.   allopurinol (ZYLOPRIM) 100 MG tablet Take 1 tablet (100 mg total) by mouth daily.   colchicine 0.6 MG tablet TAKE 1 TABLET BY MOUTH ONCE DAILY AS NEEDED FOR  GOUT  FLARE   diltiazem (CARDIZEM CD) 180 MG 24 hr capsule Take 1 capsule (180 mg total) by mouth daily.   dofetilide (TIKOSYN) 500 MCG capsule Take 1 capsule by mouth twice daily   FARXIGA 10 MG TABS tablet Take 1 tablet by mouth once daily   flecainide (TAMBOCOR) 100 MG tablet Take by mouth.   furosemide (LASIX) 40 MG tablet Take 1 tablet by mouth twice daily   metFORMIN (GLUCOPHAGE-XR) 500 MG 24 hr tablet Take 1 tablet by mouth once daily with breakfast   metoprolol succinate (TOPROL-XL) 100 MG 24 hr tablet Take 1 tablet (100 mg total) by mouth daily. Take with or immediately following a meal.   mirtazapine (REMERON) 30 MG tablet Take 1 tablet (30 mg total) by mouth at bedtime.   pantoprazole (PROTONIX) 40 MG tablet Take 1 tablet by mouth once daily   potassium chloride SA (KLOR-CON M) 20 MEQ tablet Take 2 tablets by mouth twice daily   spironolactone (ALDACTONE) 25 MG tablet spironolactone 25 mg tabs   XARELTO 20 MG TABS tablet Take 1 tablet (20 mg total) by mouth daily with supper.   No facility-administered encounter medications on file as of 04/25/2022.    Past Medical History:  Diagnosis Date   Atrial fibrillation with RVR ([place])    Gastric ulcer    Gout    Hypertension    Paroxysmal A-fib  (HCC)    Sleep apnea    USES CPAP   Wears glasses     Past Surgical History:  Procedure Laterality Date   ATRIAL FIBRILLATION ABLATION N/A 04/26/2018   Procedure: ATRIAL FIBRILLATION ABLATION;  Surgeon: Constance Haw, MD;  Location: [place] [facility];  Service: Cardiovascular;  Laterality: N/A;   ATRIAL FIBRILLATION ABLATION N/A 11/07/2019   Procedure: ATRIAL FIBRILLATION ABLATION;  Surgeon: Constance Haw, MD;  Location: [place] [facility];  Service: Cardiovascular;  Laterality: N/A;   CARDIOVERSION N/A 01/18/2018   Procedure: CARDIOVERSION;  Surgeon: Larey Dresser, MD;  Location: [facility] ENDOSCOPY;  Service: Cardiovascular;  Laterality: N/A;   CARDIOVERSION N/A 08/19/2019   Procedure: CARDIOVERSION;  Surgeon: Thayer Headings, MD;  Location: [facility] ENDOSCOPY;  Service: Cardiovascular;  Laterality: N/A;   CARDIOVERSION N/A 04/18/2021   Procedure: CARDIOVERSION;  Surgeon: Acie Fredrickson Wonda Cheng, MD;  Location: [facility] ENDOSCOPY;  Service: Cardiovascular;  Laterality: N/A;   ELBOW LIGAMENT RECONSTRUCTION Right 09/29/2014   Procedure: Merian Capron FRACTURE FIXATION, POSSIBLE LIGAMENT REPAIR;  Surgeon: Nita Sells, MD;  Location: [place];  Service: Orthopedics;  Laterality: Right;  Right elbow coranoid fracture fixation, possible ligament repair   TEE WITHOUT CARDIOVERSION N/A 01/18/2018   Procedure: TRANSESOPHAGEAL ECHOCARDIOGRAM (TEE);  Surgeon: Larey Dresser, MD;  Location[facility] ENDOSCOPY;  Service: Cardiovascular;  Laterality: N/A;   TEE WITHOUT CARDIOVERSION N/A 04/18/2021   Procedure: TRANSESOPHAGEAL ECHOCARDIOGRAM (TEE);  Surgeon: Acie Fredrickson, Wonda Cheng, MD;  Location: [facility] ENDOSCOPY;  Service: Cardiovascular;  Laterality: N/A;   WISDOM TOOTH EXTRACTION      Family History  Problem Relation Age of Onset   Hypertension Paternal Uncle    Hypertension Maternal Grandmother    Hypertension Paternal Uncle    Hypertension Paternal Uncle    Hypertension Paternal Uncle     Hypertension Paternal Uncle    Hypertension Mother    Stroke Father    Heart disease Father    Heart attack Father    Hypertension Father    Colon cancer Neg Hx     Social History   Socioeconomic History   Marital status: Married    Spouse name: Not on file   Number of children: 4   Years of education: 10   Highest education level: Not on file  Occupational History   Occupation: Truck Education administrator: [employer]  Tobacco Use   Smoking status: Former    Types: Cigars    Quit date: 07/20/2021    Years since quitting: 0.7   Smokeless tobacco: Never   Tobacco comments:    Former smoker 08/16/21  Vaping Use   Vaping Use: Never used  Substance and Sexual Activity   Alcohol use: Not Currently    Comment: occ   Drug use: No   Sexual activity: Yes    Comment: 5 a day  Other Topics Concern   Not on file  Social History Narrative   Not on file   Social Determinants of Health   Financial Resource Strain: High Risk (06/28/2021)   Overall Financial Resource Strain (CARDIA)    Difficulty of Paying Living Expenses: Hard  Food Insecurity: No Food Insecurity (06/28/2021)   Hunger Vital Sign    Worried About Running Out of Food in the Last Year: Never true    Ran Out of Food in the Last Year: Never true  Transportation Needs: No Transportation Needs (06/28/2021)   PRAPARE - Hydrologist (Medical): No    Lack of Transportation (Non-Medical): No  Physical Activity: Not on file  Stress: Not on file  Social Connections: Not on file  Intimate Partner Violence: Not on file    Review of Systems  All other systems reviewed and are negative.       Objective    BP 134/79   Pulse 60   Temp 97.7 F (36.5 C) (Oral)   Resp 16   Ht '6\' 2"'$  (1.88 m)   Wt (!) 301 lb 3.2 oz (136.6 kg)   SpO2 94%   BMI 38.67 kg/m   Physical Exam Vitals and nursing note reviewed.  Constitutional:      General: He is not in acute distress.     Appearance: He is obese.  Cardiovascular:     Rate and Rhythm: Normal rate and regular rhythm.  Pulmonary:     Effort: Pulmonary effort is normal.     Breath sounds: Normal breath sounds.  Abdominal:     Palpations: Abdomen is soft.     Tenderness: There is no abdominal tenderness.  Neurological:     General: No focal deficit present.     Mental Status: He is alert and oriented to person, place, and time.         Assessment & Plan:   1. Type 2 diabetes  mellitus without complication, without long-term current use of insulin (HCC) Recent A1c at goal. Continue and monitor  2. Essential hypertension, benign Appears stable. Continue and monitor  3. Class 3 severe obesity with serious comorbidity and body mass index (BMI) of 40.0 to 44.9 in adult, unspecified obesity type ([place]) Discussed dietary and activity options.     No follow-ups on file.   Becky Sax, MD

## 2022-04-27 NOTE — Telephone Encounter (Signed)
I tried to reach the pt to set up tele appt for pre op , though no vm set up and DPR is only to s/w him.

## 2022-04-28 ENCOUNTER — Telehealth: Payer: Self-pay | Admitting: *Deleted

## 2022-04-28 NOTE — Telephone Encounter (Signed)
Pt agreeable to plan of care for televisit for pre op 05/03/22. Med rec and consent are done.     Patient Consent for Virtual Visit        Samuel Flynn has provided verbal consent on 04/28/2022 for a virtual visit (video or telephone).   CONSENT FOR VIRTUAL VISIT FOR:  Samuel Flynn  By participating in this virtual visit I agree to the following:  I hereby voluntarily request, consent and authorize [facility] and its employed or contracted physicians, physician assistants, nurse practitioners or other licensed health care professionals (the Practitioner), to provide me with telemedicine health care services (the "Services") as deemed necessary by the treating Practitioner. I acknowledge and consent to receive the Services by the Practitioner via telemedicine. I understand that the telemedicine visit will involve communicating with the Practitioner through live audiovisual communication technology and the disclosure of certain medical information by electronic transmission. I acknowledge that I have been given the opportunity to request an in-person assessment or other available alternative prior to the telemedicine visit and am voluntarily participating in the telemedicine visit.  I understand that I have the right to withhold or withdraw my consent to the use of telemedicine in the course of my care at any time, without affecting my right to future care or treatment, and that the Practitioner or I may terminate the telemedicine visit at any time. I understand that I have the right to inspect all information obtained and/or recorded in the course of the telemedicine visit and may receive copies of available information for a reasonable fee.  I understand that some of the potential risks of receiving the Services via telemedicine include:  Delay or interruption in medical evaluation due to technological equipment failure or disruption; Information transmitted may not be sufficient  (e.g. poor resolution of images) to allow for appropriate medical decision making by the Practitioner; and/or  In rare instances, security protocols could fail, causing a breach of personal health information.  Furthermore, I acknowledge that it is my responsibility to provide information about my medical history, conditions and care that is complete and accurate to the best of my ability. I acknowledge that Practitioner's advice, recommendations, and/or decision may be based on factors not within their control, such as incomplete or inaccurate data provided by me or distortions of diagnostic images or specimens that may result from electronic transmissions. I understand that the practice of medicine is not an exact science and that Practitioner makes no warranties or guarantees regarding treatment outcomes. I acknowledge that a copy of this consent can be made available to me via my patient portal ([facility]), or I can request a printed copy by calling the office of [facility].    I understand that my insurance will be billed for this visit.   I have read or had this consent read to me. I understand the contents of this consent, which adequately explains the benefits and risks of the Services being provided via telemedicine.  I have been provided ample opportunity to ask questions regarding this consent and the Services and have had my questions answered to my satisfaction. I give my informed consent for the services to be provided through the use of telemedicine in my medical care

## 2022-04-28 NOTE — Telephone Encounter (Signed)
Pt agreeable to plan of care for televisit for pre op 05/03/22. Med rec and consent are done.

## 2022-05-03 ENCOUNTER — Encounter: Payer: Self-pay | Admitting: Physician Assistant

## 2022-05-03 ENCOUNTER — Ambulatory Visit: Payer: Medicaid Other | Attending: Cardiology | Admitting: Physician Assistant

## 2022-05-03 DIAGNOSIS — Z01810 Encounter for preprocedural cardiovascular examination: Secondary | ICD-10-CM | POA: Diagnosis not present

## 2022-05-03 NOTE — Progress Notes (Signed)
Virtual Visit via Telephone Note   Because of Samuel Flynn's co-morbid illnesses, he is at least at moderate risk for complications without adequate follow up.  This format is felt to be most appropriate for this patient at this time.  The patient did not have access to video technology/had technical difficulties with video requiring transitioning to audio format only (telephone).  All issues noted in this document were discussed and addressed.  No physical exam could be performed with this format.  Please refer to the patient's chart for his consent to telehealth for [facility].  Evaluation Performed:  Preoperative cardiovascular risk assessment _____________   Date:  05/03/2022   Patient ID:  Samuel Flynn, DOB 1975-11-29, MRN [id number] Patient Location:  Home Provider location:   Office  Primary Care Provider:  Dorna Mai, MD Primary Cardiologist:  Skeet Latch, MD  Chief Complaint / Patient Profile   46 y.o. y/o male with a h/o  Atrial fibrillation  S/p PVI ablation 2019, 2021 Atrial Flutter s/p DCCV in 04/2021, 06/2021 Dofetilide Rx  (HFpEF) heart failure with preserved ejection fraction  Echo 04/2021: EF 55-60, no RWMA, mild LVH, normal RVSF TEE 04/2021: Trivial AI, mild MR, EF 60-65 CT 11/2019: CAC score 0, no significant CAD OSA Diabetes mellitus  Hypertension  Gout   who is pending  Procedure:   LAPAROSCOPIC BARIATRIC SURGERY Date of Surgery:  Clearance TBD                              Surgeon:  DR. Norris Cross Surgeon's Group or Practice Name:  [facility] Type of Clearance Requested:   - Medical  - Pharmacy:  Hold Rivaroxaban (Xarelto)   Type of Anesthesia:  General   He  presents today for telephonic preoperative cardiovascular risk assessment.  Past Medical History    Past Medical History:  Diagnosis Date   Atrial fibrillation with RVR ([place])    Gastric ulcer    Gout    Hypertension     Paroxysmal A-fib (HCC)    Sleep apnea    USES CPAP   Wears glasses    Past Surgical History:  Procedure Laterality Date   ATRIAL FIBRILLATION ABLATION N/A 04/26/2018   Procedure: ATRIAL FIBRILLATION ABLATION;  Surgeon: Constance Haw, MD;  Location: [place] [facility];  Service: Cardiovascular;  Laterality: N/A;   ATRIAL FIBRILLATION ABLATION N/A 11/07/2019   Procedure: ATRIAL FIBRILLATION ABLATION;  Surgeon: Constance Haw, MD;  Location: [place] [facility];  Service: Cardiovascular;  Laterality: N/A;   CARDIOVERSION N/A 01/18/2018   Procedure: CARDIOVERSION;  Surgeon: Larey Dresser, MD;  Location: [facility] ENDOSCOPY;  Service: Cardiovascular;  Laterality: N/A;   CARDIOVERSION N/A 08/19/2019   Procedure: CARDIOVERSION;  Surgeon: Thayer Headings, MD;  Location: [facility] ENDOSCOPY;  Service: Cardiovascular;  Laterality: N/A;   CARDIOVERSION N/A 04/18/2021   Procedure: CARDIOVERSION;  Surgeon: Acie Fredrickson Wonda Cheng, MD;  Location: [facility] ENDOSCOPY;  Service: Cardiovascular;  Laterality: N/A;   ELBOW LIGAMENT RECONSTRUCTION Right 09/29/2014   Procedure: Merian Capron FRACTURE FIXATION, POSSIBLE LIGAMENT REPAIR;  Surgeon: Nita Sells, MD;  Location: [place];  Service: Orthopedics;  Laterality: Right;  Right elbow coranoid fracture fixation, possible ligament repair   TEE WITHOUT CARDIOVERSION N/A 01/18/2018   Procedure: TRANSESOPHAGEAL ECHOCARDIOGRAM (TEE);  Surgeon: Larey Dresser, MD;  Location: [facility] ENDOSCOPY;  Service: Cardiovascular;  Laterality: N/A;   TEE WITHOUT CARDIOVERSION  N/A 04/18/2021   Procedure: TRANSESOPHAGEAL ECHOCARDIOGRAM (TEE);  Surgeon: Acie Fredrickson Wonda Cheng, MD;  Location: [facility] ENDOSCOPY;  Service: Cardiovascular;  Laterality: N/A;   WISDOM TOOTH EXTRACTION      Allergies  No Known Allergies  History of Present Illness    Samuel Flynn is a 46 y.o. male who presents via audio/video conferencing for a telehealth visit today.  Pt was last seen in cardiology  clinic on 09/20/21 by Dr. Curt Bears.  At that time Samuel Flynn was doing well.  The patient is now pending procedure as outlined above. Since his last visit, he has done well without chest pain, shortness of breath, palpitations, syncope.   Home Medications    Prior to Admission medications   Medication Sig Start Date End Date Taking? Authorizing Provider  acetaminophen (TYLENOL) 325 MG tablet Take 2 tablets (650 mg total) by mouth every 4 (four) hours as needed for headache or mild pain. 04/18/21   Margie Billet, NP  allopurinol (ZYLOPRIM) 100 MG tablet Take 1 tablet (100 mg total) by mouth daily. 09/13/21   Skeet Latch, MD  colchicine 0.6 MG tablet TAKE 1 TABLET BY MOUTH ONCE DAILY AS NEEDED FOR  GOUT  FLARE 04/16/22   Dorna Mai, MD  diltiazem (CARDIZEM CD) 180 MG 24 hr capsule Take 1 capsule (180 mg total) by mouth daily. 08/07/21   Darreld Mclean, PA-C  dofetilide (TIKOSYN) 500 MCG capsule Take 1 capsule by mouth twice daily 02/06/22   Camnitz, Ocie Doyne, MD  FARXIGA 10 MG TABS tablet Take 1 tablet by mouth once daily 04/16/22   Dorna Mai, MD  flecainide (TAMBOCOR) 100 MG tablet Take by mouth. 07/01/21   [provider]  furosemide (LASIX) 40 MG tablet Take 1 tablet by mouth twice daily 02/06/22   Camnitz, Ocie Doyne, MD  metFORMIN (GLUCOPHAGE-XR) 500 MG 24 hr tablet Take 1 tablet by mouth once daily with breakfast 03/02/22   Dorna Mai, MD  metoprolol succinate (TOPROL-XL) 100 MG 24 hr tablet Take 1 tablet (100 mg total) by mouth daily. Take with or immediately following a meal. 09/20/21   Camnitz, Ocie Doyne, MD  mirtazapine (REMERON) 30 MG tablet Take 1 tablet (30 mg total) by mouth at bedtime. 09/16/21   Dorna Mai, MD  pantoprazole (PROTONIX) 40 MG tablet Take 1 tablet by mouth once daily 02/17/22   Dorna Mai, MD  potassium chloride SA (KLOR-CON M) 20 MEQ tablet Take 2 tablets by mouth twice daily 12/12/21   Darreld Mclean, PA-C  spironolactone  (ALDACTONE) 25 MG tablet spironolactone 25 mg tabs    [provider]  XARELTO 20 MG TABS tablet Take 1 tablet (20 mg total) by mouth daily with supper. 12/02/21   Constance Haw, MD    Physical Exam    Vital Signs:  Samuel Flynn does not have vital signs available for review today.  Given telephonic nature of communication, physical exam is limited. AAOx3. NAD. Normal affect.  Speech and respirations are unlabored.  Accessory Clinical Findings    None  Assessment & Plan    1.  Preoperative Cardiovascular Risk Assessment:    Samuel Flynn perioperative risk of a major cardiac event is 0.9% according to the Revised Cardiac Risk Index (RCRI).  Therefore, he is at low risk for perioperative complications.   His functional capacity is good at 4.31 METs according to the Duke Activity Status Index (DASI). Recommendations: According to ACC/AHA guidelines, no further cardiovascular testing needed.  The patient  may proceed to surgery at acceptable risk.   Antiplatelet and/or Anticoagulation Recommendations: Xarelto (Rivaroxaban) can be held for 2-3 days prior to surgery.  Please resume post op when felt to be safe.   Of note, DOACs are not preferred in patients after bariatric surgery due to decreased efficacy/absorption. Ideally, patient would need to be changed to warfarin post-procedure when anticoagulation is resumed.   A copy of this note will be routed to requesting surgeon.  Time:   Today, I have spent 8 minutes with the patient with telehealth technology discussing medical history, symptoms, and management plan.     Samuel Dopp, PA-C 05/03/2022, 9:57 AM

## 2022-05-07 ENCOUNTER — Other Ambulatory Visit: Payer: Self-pay | Admitting: Family Medicine

## 2022-05-07 DIAGNOSIS — E119 Type 2 diabetes mellitus without complications: Secondary | ICD-10-CM

## 2022-05-08 ENCOUNTER — Other Ambulatory Visit: Payer: Self-pay | Admitting: Cardiology

## 2022-05-10 DIAGNOSIS — Z713 Dietary counseling and surveillance: Secondary | ICD-10-CM | POA: Diagnosis not present

## 2022-05-21 ENCOUNTER — Other Ambulatory Visit: Payer: Self-pay | Admitting: Family Medicine

## 2022-05-21 DIAGNOSIS — K219 Gastro-esophageal reflux disease without esophagitis: Secondary | ICD-10-CM

## 2022-05-25 DIAGNOSIS — Z9889 Other specified postprocedural states: Secondary | ICD-10-CM | POA: Diagnosis not present

## 2022-05-25 DIAGNOSIS — I10 Essential (primary) hypertension: Secondary | ICD-10-CM | POA: Diagnosis not present

## 2022-05-25 DIAGNOSIS — M109 Gout, unspecified: Secondary | ICD-10-CM | POA: Diagnosis not present

## 2022-05-25 DIAGNOSIS — I4891 Unspecified atrial fibrillation: Secondary | ICD-10-CM | POA: Diagnosis not present

## 2022-05-25 DIAGNOSIS — Z8679 Personal history of other diseases of the circulatory system: Secondary | ICD-10-CM | POA: Diagnosis not present

## 2022-05-25 DIAGNOSIS — E119 Type 2 diabetes mellitus without complications: Secondary | ICD-10-CM | POA: Diagnosis not present

## 2022-05-25 DIAGNOSIS — Z9229 Personal history of other drug therapy: Secondary | ICD-10-CM | POA: Diagnosis not present

## 2022-05-25 DIAGNOSIS — Z87891 Personal history of nicotine dependence: Secondary | ICD-10-CM | POA: Insufficient documentation

## 2022-05-25 DIAGNOSIS — G4733 Obstructive sleep apnea (adult) (pediatric): Secondary | ICD-10-CM | POA: Diagnosis not present

## 2022-06-06 DIAGNOSIS — I10 Essential (primary) hypertension: Secondary | ICD-10-CM | POA: Diagnosis not present

## 2022-06-06 DIAGNOSIS — Z01818 Encounter for other preprocedural examination: Secondary | ICD-10-CM | POA: Diagnosis not present

## 2022-06-06 DIAGNOSIS — E119 Type 2 diabetes mellitus without complications: Secondary | ICD-10-CM | POA: Diagnosis not present

## 2022-06-06 DIAGNOSIS — Z9229 Personal history of other drug therapy: Secondary | ICD-10-CM | POA: Diagnosis not present

## 2022-06-06 DIAGNOSIS — K449 Diaphragmatic hernia without obstruction or gangrene: Secondary | ICD-10-CM | POA: Diagnosis not present

## 2022-06-06 DIAGNOSIS — Z8679 Personal history of other diseases of the circulatory system: Secondary | ICD-10-CM | POA: Diagnosis not present

## 2022-06-06 DIAGNOSIS — Z9889 Other specified postprocedural states: Secondary | ICD-10-CM | POA: Diagnosis not present

## 2022-06-06 DIAGNOSIS — G4733 Obstructive sleep apnea (adult) (pediatric): Secondary | ICD-10-CM | POA: Diagnosis not present

## 2022-06-06 DIAGNOSIS — M109 Gout, unspecified: Secondary | ICD-10-CM | POA: Diagnosis not present

## 2022-06-06 DIAGNOSIS — Z87891 Personal history of nicotine dependence: Secondary | ICD-10-CM | POA: Diagnosis not present

## 2022-06-12 DIAGNOSIS — Z79899 Other long term (current) drug therapy: Secondary | ICD-10-CM | POA: Diagnosis not present

## 2022-06-12 DIAGNOSIS — F17210 Nicotine dependence, cigarettes, uncomplicated: Secondary | ICD-10-CM | POA: Diagnosis not present

## 2022-06-12 DIAGNOSIS — M109 Gout, unspecified: Secondary | ICD-10-CM | POA: Diagnosis not present

## 2022-06-12 DIAGNOSIS — G4733 Obstructive sleep apnea (adult) (pediatric): Secondary | ICD-10-CM | POA: Diagnosis not present

## 2022-06-12 DIAGNOSIS — Z7901 Long term (current) use of anticoagulants: Secondary | ICD-10-CM | POA: Diagnosis not present

## 2022-06-12 DIAGNOSIS — Z6839 Body mass index (BMI) 39.0-39.9, adult: Secondary | ICD-10-CM | POA: Diagnosis not present

## 2022-06-12 DIAGNOSIS — I10 Essential (primary) hypertension: Secondary | ICD-10-CM | POA: Diagnosis not present

## 2022-06-12 DIAGNOSIS — Z733 Stress, not elsewhere classified: Secondary | ICD-10-CM | POA: Diagnosis not present

## 2022-06-12 DIAGNOSIS — Z8616 Personal history of COVID-19: Secondary | ICD-10-CM | POA: Diagnosis not present

## 2022-06-12 DIAGNOSIS — E669 Obesity, unspecified: Secondary | ICD-10-CM | POA: Diagnosis not present

## 2022-06-12 DIAGNOSIS — Z7984 Long term (current) use of oral hypoglycemic drugs: Secondary | ICD-10-CM | POA: Diagnosis not present

## 2022-06-12 DIAGNOSIS — E119 Type 2 diabetes mellitus without complications: Secondary | ICD-10-CM | POA: Diagnosis not present

## 2022-06-12 DIAGNOSIS — I4891 Unspecified atrial fibrillation: Secondary | ICD-10-CM | POA: Diagnosis not present

## 2022-06-25 ENCOUNTER — Other Ambulatory Visit: Payer: Self-pay | Admitting: Family Medicine

## 2022-06-25 ENCOUNTER — Other Ambulatory Visit: Payer: Self-pay | Admitting: Student

## 2022-06-25 DIAGNOSIS — K219 Gastro-esophageal reflux disease without esophagitis: Secondary | ICD-10-CM

## 2022-06-25 DIAGNOSIS — M1000 Idiopathic gout, unspecified site: Secondary | ICD-10-CM

## 2022-06-27 DIAGNOSIS — G4733 Obstructive sleep apnea (adult) (pediatric): Secondary | ICD-10-CM | POA: Diagnosis not present

## 2022-06-27 NOTE — Telephone Encounter (Signed)
This is Dr. Pinewood's pt.  °

## 2022-07-09 ENCOUNTER — Other Ambulatory Visit: Payer: Self-pay | Admitting: Cardiology

## 2022-07-10 NOTE — Telephone Encounter (Signed)
Prescription refill request for Xarelto received.  Indication: AF Last office visit: 05/03/22  Kathleen Argue PA-C Weight: [id number] Age: 46 Scr: 1.11 on 08/24/21 CrCl: 140.44  Based on above findings Xarelto '20mg'$  daily is the appropriate dose.  Refill approved.

## 2022-08-02 ENCOUNTER — Other Ambulatory Visit: Payer: Self-pay | Admitting: Family Medicine

## 2022-08-02 DIAGNOSIS — M1000 Idiopathic gout, unspecified site: Secondary | ICD-10-CM

## 2022-08-03 NOTE — Telephone Encounter (Signed)
Requested Prescriptions  Pending Prescriptions Disp Refills   colchicine 0.6 MG tablet [Pharmacy Med Name: Colchicine 0.6 MG Oral Tablet] 90 tablet 0    Sig: TAKE 1 TABLET BY MOUTH ONCE DAILY AS NEEDED FOR GOUT     Endocrinology:  Gout Agents - colchicine Failed - 08/02/2022  6:45 PM      Failed - ALT in normal range and within 360 days    ALT  Date Value Ref Range Status  04/15/2021 22 0 - 44 U/L Final         Failed - AST in normal range and within 360 days    AST  Date Value Ref Range Status  04/15/2021 22 15 - 41 U/L Final         Failed - CBC within normal limits and completed in the last 12 months    WBC  Date Value Ref Range Status  06/22/2021 7.5 4.0 - 10.5 K/uL Final   RBC  Date Value Ref Range Status  06/22/2021 5.95 (H) 4.22 - 5.81 MIL/uL Final   Hemoglobin  Date Value Ref Range Status  06/22/2021 16.0 13.0 - 17.0 g/dL Final  08/11/2019 13.6 13.0 - 17.7 g/dL Final   HCT  Date Value Ref Range Status  06/22/2021 48.3 39.0 - 52.0 % Final   Hematocrit  Date Value Ref Range Status  08/11/2019 41.1 37.5 - 51.0 % Final   MCHC  Date Value Ref Range Status  06/22/2021 33.1 30.0 - 36.0 g/dL Final   [facility]  Date Value Ref Range Status  06/22/2021 26.9 26.0 - 34.0 pg Final   MCV  Date Value Ref Range Status  06/22/2021 81.2 80.0 - 100.0 fL Final  08/11/2019 84 79 - 97 fL Final   No results found for: "PLTCOUNTKUC", "LABPLAT", "POCPLA" RDW  Date Value Ref Range Status  06/22/2021 19.1 (H) 11.5 - 15.5 % Final  08/11/2019 18.3 (H) 11.6 - 15.4 % Final         Passed - Cr in normal range and within 360 days    Creatinine, Ser  Date Value Ref Range Status  08/24/2021 1.11 0.61 - 1.24 mg/dL Final         Passed - Valid encounter within last 12 months    Recent Outpatient Visits           3 months ago Type 2 diabetes mellitus without complication, without long-term current use of insulin ([place])   Primary Care at [facility], MD   4 months  ago Type 2 diabetes mellitus without complication, without long-term current use of insulin [facility])   Primary Care at [facility], MD   10 months ago Type 2 diabetes mellitus without complication, without long-term current use of insulin [facility])   Primary Care at [facility], MD   11 months ago Hospital discharge follow-up   Primary Care at [facility] [place], NP   1 year ago Essential hypertension, benign   Primary Care at [facility], Flonnie Hailstone, NP       Future Appointments             In 1 month Dorna Mai, MD Primary Care at [facility]   In 1 month Ralene Bathe, MD [place]

## 2022-08-04 DIAGNOSIS — Z419 Encounter for procedure for purposes other than remedying health state, unspecified: Secondary | ICD-10-CM | POA: Diagnosis not present

## 2022-08-07 ENCOUNTER — Other Ambulatory Visit: Payer: Self-pay | Admitting: Cardiovascular Disease

## 2022-08-08 NOTE — Telephone Encounter (Signed)
Pt was told in October appt required for more refills.  Has not scheduled appt.  Needs to contact PCP.

## 2022-09-04 ENCOUNTER — Other Ambulatory Visit: Payer: Self-pay | Admitting: Cardiovascular Disease

## 2022-09-04 DIAGNOSIS — Z419 Encounter for procedure for purposes other than remedying health state, unspecified: Secondary | ICD-10-CM | POA: Diagnosis not present

## 2022-09-15 ENCOUNTER — Ambulatory Visit (INDEPENDENT_AMBULATORY_CARE_PROVIDER_SITE_OTHER): Payer: Medicaid Other | Admitting: Family Medicine

## 2022-09-15 ENCOUNTER — Encounter: Payer: Self-pay | Admitting: Family Medicine

## 2022-09-15 ENCOUNTER — Telehealth: Payer: Self-pay | Admitting: Family Medicine

## 2022-09-15 VITALS — BP 138/87 | HR 54 | Temp 98.1°F | Resp 16 | Wt 232.0 lb

## 2022-09-15 DIAGNOSIS — G4700 Insomnia, unspecified: Secondary | ICD-10-CM | POA: Diagnosis not present

## 2022-09-15 DIAGNOSIS — E119 Type 2 diabetes mellitus without complications: Secondary | ICD-10-CM

## 2022-09-15 DIAGNOSIS — I10 Essential (primary) hypertension: Secondary | ICD-10-CM

## 2022-09-15 LAB — POCT GLYCOSYLATED HEMOGLOBIN (HGB A1C): Hemoglobin A1C: 5.2 % (ref 4.0–5.6)

## 2022-09-15 MED ORDER — TRAZODONE HCL 100 MG PO TABS: 100.0000 mg | ORAL_TABLET | Freq: Every day | ORAL | 1 refills | Status: DC

## 2022-09-15 MED ORDER — AMMONIUM LACTATE 10 % EX CREA: TOPICAL_CREAM | CUTANEOUS | 3 refills | Status: DC

## 2022-09-15 NOTE — Telephone Encounter (Signed)
[facility] called and they only have 12% instead of Ammonium Lactate 10 % CREA [[id number]]  / please send new RX for 12% cream

## 2022-09-15 NOTE — Progress Notes (Unsigned)
Patient is here for their 6 month follow-up Patient has no concerns today Care gaps have been discussed with patient  

## 2022-09-18 ENCOUNTER — Other Ambulatory Visit: Payer: Self-pay | Admitting: Family Medicine

## 2022-09-18 MED ORDER — AMMONIUM LACTATE 12 % EX LOTN: 1.0000 | TOPICAL_LOTION | CUTANEOUS | 0 refills | Status: DC | PRN

## 2022-09-18 NOTE — Progress Notes (Signed)
Established Patient Office Visit  Subjective    Patient ID: Samuel Flynn, male    DOB: 1976/06/22  Age: 47 y.o. MRN: [id number]  CC:  Chief Complaint  Patient presents with   Follow-up   Diabetes    HPI Samuel Flynn presents for routine follow up of chronic med issues. Patient denies acute complaints or concerns.    Outpatient Encounter Medications as of 09/15/2022  Medication Sig   acetaminophen (TYLENOL) 325 MG tablet Take 2 tablets (650 mg total) by mouth every 4 (four) hours as needed for headache or mild pain.   allopurinol (ZYLOPRIM) 100 MG tablet Take 1 tablet (100 mg total) by mouth daily.   Ammonium Lactate 10 % CREA Apply to affected area bid prn   colchicine 0.6 MG tablet TAKE 1 TABLET BY MOUTH ONCE DAILY AS NEEDED FOR GOUT   diltiazem (CARDIZEM CD) 180 MG 24 hr capsule Take 1 capsule (180 mg total) by mouth daily.   dofetilide (TIKOSYN) 500 MCG capsule Take 1 capsule by mouth twice daily   FARXIGA 10 MG TABS tablet Take 1 tablet by mouth once daily   flecainide (TAMBOCOR) 100 MG tablet Take by mouth.   furosemide (LASIX) 40 MG tablet Take 1 tablet by mouth twice daily   levofloxacin (LEVAQUIN) 500 MG tablet Take by mouth.   metFORMIN (GLUCOPHAGE-XR) 500 MG 24 hr tablet Take 1 tablet by mouth once daily with breakfast   metoCLOPramide (REGLAN) 10 MG tablet Take by mouth.   metoprolol succinate (TOPROL-XL) 100 MG 24 hr tablet Take 1 tablet by mouth once daily   mirtazapine (REMERON) 30 MG tablet Take 1 tablet (30 mg total) by mouth at bedtime.   pantoprazole (PROTONIX) 40 MG tablet Take 1 tablet by mouth once daily   potassium chloride (KLOR-CON) 20 MEQ packet Take by mouth.   potassium chloride SA (KLOR-CON M) 20 MEQ tablet Take 2 tablets (40 mEq total) by mouth 2 (two) times daily. Please contact the office and schedule appointment for additional refills. 1st Attempt.   spironolactone (ALDACTONE) 25 MG tablet spironolactone 25 mg tabs   traZODone  (DESYREL) 100 MG tablet Take 1 tablet (100 mg total) by mouth at bedtime.   XARELTO 20 MG TABS tablet TAKE 1 TABLET BY MOUTH ONCE DAILY WITH SUPPER   ursodiol (ACTIGALL) 300 MG capsule Take 300 mg by mouth 2 (two) times daily.   No facility-administered encounter medications on file as of 09/15/2022.    Past Medical History:  Diagnosis Date   Atrial fibrillation with RVR ([place])    Gastric ulcer    Gout    Hypertension    Paroxysmal A-fib (HCC)    Sleep apnea    USES CPAP   Wears glasses     Past Surgical History:  Procedure Laterality Date   ATRIAL FIBRILLATION ABLATION N/A 04/26/2018   Procedure: ATRIAL FIBRILLATION ABLATION;  Surgeon: Constance Haw, MD;  Location: [place] [facility];  Service: Cardiovascular;  Laterality: N/A;   ATRIAL FIBRILLATION ABLATION N/A 11/07/2019   Procedure: ATRIAL FIBRILLATION ABLATION;  Surgeon: Constance Haw, MD;  Location: [place] [facility];  Service: Cardiovascular;  Laterality: N/A;   CARDIOVERSION N/A 01/18/2018   Procedure: CARDIOVERSION;  Surgeon: Larey Dresser, MD;  Location: [facility] ENDOSCOPY;  Service: Cardiovascular;  Laterality: N/A;   CARDIOVERSION N/A 08/19/2019   Procedure: CARDIOVERSION;  Surgeon: Thayer Headings, MD;  Location: [place];  Service: Cardiovascular;  Laterality: N/A;   CARDIOVERSION N/A 04/18/2021  Procedure: CARDIOVERSION;  Surgeon: Thayer Headings, MD;  Location: [place];  Service: Cardiovascular;  Laterality: N/A;   ELBOW LIGAMENT RECONSTRUCTION Right 09/29/2014   Procedure: Merian Capron FRACTURE FIXATION, POSSIBLE LIGAMENT REPAIR;  Surgeon: Nita Sells, MD;  Location: [place];  Service: Orthopedics;  Laterality: Right;  Right elbow coranoid fracture fixation, possible ligament repair   TEE WITHOUT CARDIOVERSION N/A 01/18/2018   Procedure: TRANSESOPHAGEAL ECHOCARDIOGRAM (TEE);  Surgeon: Larey Dresser, MD;  Location: [facility] ENDOSCOPY;  Service: Cardiovascular;  Laterality: N/A;    TEE WITHOUT CARDIOVERSION N/A 04/18/2021   Procedure: TRANSESOPHAGEAL ECHOCARDIOGRAM (TEE);  Surgeon: Acie Fredrickson, Wonda Cheng, MD;  Location: [facility] ENDOSCOPY;  Service: Cardiovascular;  Laterality: N/A;   WISDOM TOOTH EXTRACTION      Family History  Problem Relation Age of Onset   Hypertension Paternal Uncle    Hypertension Maternal Grandmother    Hypertension Paternal Uncle    Hypertension Paternal Uncle    Hypertension Paternal Uncle    Hypertension Paternal Uncle    Hypertension Mother    Stroke Father    Heart disease Father    Heart attack Father    Hypertension Father    Colon cancer Neg Hx     Social History   Socioeconomic History   Marital status: Married    Spouse name: Not on file   Number of children: 4   Years of education: 10   Highest education level: Not on file  Occupational History   Occupation: Truck Education administrator: [employer]  Tobacco Use   Smoking status: Former    Types: Cigars    Quit date: 07/20/2021    Years since quitting: 1.1   Smokeless tobacco: Never   Tobacco comments:    Former smoker 08/16/21  Vaping Use   Vaping Use: Never used  Substance and Sexual Activity   Alcohol use: Not Currently    Comment: occ   Drug use: No   Sexual activity: Yes    Comment: 5 a day  Other Topics Concern   Not on file  Social History Narrative   Not on file   Social Determinants of Health   Financial Resource Strain: High Risk (06/28/2021)   Overall Financial Resource Strain (CARDIA)    Difficulty of Paying Living Expenses: Hard  Food Insecurity: No Food Insecurity (06/28/2021)   Hunger Vital Sign    Worried About Running Out of Food in the Last Year: Never true    Ran Out of Food in the Last Year: Never true  Transportation Needs: No Transportation Needs (06/28/2021)   PRAPARE - Hydrologist (Medical): No    Lack of Transportation (Non-Medical): No  Physical Activity: Not on file  Stress: Not on file  Social  Connections: Not on file  Intimate Partner Violence: Not on file    Review of Systems  All other systems reviewed and are negative.       Objective    BP 138/87   Pulse (!) 54   Temp 98.1 F (36.7 C) (Oral)   Resp 16   Wt 232 lb (105.2 kg)   BMI 29.79 kg/m   Physical Exam Vitals and nursing note reviewed.  Constitutional:      General: He is not in acute distress. Cardiovascular:     Rate and Rhythm: Normal rate and regular rhythm.  Pulmonary:     Effort: Pulmonary effort is normal.     Breath sounds: Normal breath sounds.  Abdominal:     Palpations: Abdomen is soft.     Tenderness: There is no abdominal tenderness.  Neurological:     General: No focal deficit present.     Mental Status: He is alert and oriented to person, place, and time.         Assessment & Plan:   1. Type 2 diabetes mellitus without complication, without long-term current use of insulin (HCC) Improved A1c and at goal. continue - POCT glycosylated hemoglobin (Hb A1C)  2. Essential hypertension, benign Appears stable. continue  3. Insomnia, unspecified type Trazodone prescribed. Sleep hygiene discussed.    Return in about 6 months (around 03/16/2023) for follow up.   Becky Sax, MD

## 2022-09-25 ENCOUNTER — Ambulatory Visit (INDEPENDENT_AMBULATORY_CARE_PROVIDER_SITE_OTHER): Payer: Medicaid Other | Admitting: Dermatology

## 2022-09-25 DIAGNOSIS — L821 Other seborrheic keratosis: Secondary | ICD-10-CM | POA: Diagnosis not present

## 2022-09-25 DIAGNOSIS — L918 Other hypertrophic disorders of the skin: Secondary | ICD-10-CM | POA: Diagnosis not present

## 2022-09-25 DIAGNOSIS — L820 Inflamed seborrheic keratosis: Secondary | ICD-10-CM

## 2022-09-25 NOTE — Patient Instructions (Signed)
Due to recent changes in healthcare laws, you may see results of your pathology and/or laboratory studies on MyChart before the doctors have had a chance to review them. We understand that in some cases there may be results that are confusing or concerning to you. Please understand that not all results are received at the same time and often the doctors may need to interpret multiple results in order to provide you with the best plan of care or course of treatment. Therefore, we ask that you please give us 2 business days to thoroughly review all your results before contacting the office for clarification. Should we see a critical lab result, you will be contacted sooner.   If You Need Anything After Your Visit  If you have any questions or concerns for your doctor, please call our main line at [phone] and press option 4 to reach your doctor's medical assistant. If no one answers, please leave a voicemail as directed and we will return your call as soon as possible. Messages left after 4 pm will be answered the following business day.   You may also send us a message via MyChart. We typically respond to MyChart messages within 1-2 business days.  For prescription refills, please ask your pharmacy to contact our office. Our fax number is [phone].  If you have an urgent issue when the clinic is closed that cannot wait until the next business day, you can page your doctor at the number below.    Please note that while we do our best to be available for urgent issues outside of office hours, we are not available 24/7.   If you have an urgent issue and are unable to reach us, you may choose to seek medical care at your doctor's office, retail clinic, urgent care center, or emergency room.  If you have a medical emergency, please immediately call 911 or go to the emergency department.  Pager Numbers  - Dr. Kowalski: [phone]  - Dr. Moye: [phone]  - Dr. Stewart:  [phone]  In the event of inclement weather, please call our main line at [phone] for an update on the status of any delays or closures.  Dermatology Medication Tips: Please keep the boxes that topical medications come in in order to help keep track of the instructions about where and how to use these. Pharmacies typically print the medication instructions only on the boxes and not directly on the medication tubes.   If your medication is too expensive, please contact our office at [phone] option 4 or send us a message through MyChart.   We are unable to tell what your co-pay for medications will be in advance as this is different depending on your insurance coverage. However, we may be able to find a substitute medication at lower cost or fill out paperwork to get insurance to cover a needed medication.   If a prior authorization is required to get your medication covered by your insurance company, please allow us 1-2 business days to complete this process.  Drug prices often vary depending on where the prescription is filled and some pharmacies may offer cheaper prices.  The website www.goodrx.com contains coupons for medications through different pharmacies. The prices here do not account for what the cost may be with help from insurance (it may be cheaper with your insurance), but the website can give you the price if you did not use any insurance.  - You can print the associated coupon and take it with   your prescription to the pharmacy.  - You may also stop by our office during regular business hours and pick up a GoodRx coupon card.  - If you need your prescription sent electronically to a different pharmacy, notify our office through [place] MyChart or by phone at [phone] option 4.     Si Usted Necesita Algo Despus de Su Visita  Tambin puede enviarnos un mensaje a travs de MyChart. Por lo general respondemos a los mensajes de MyChart en el transcurso de 1 a 2  das hbiles.  Para renovar recetas, por favor pida a su farmacia que se ponga en contacto con nuestra oficina. Nuestro nmero de fax es el [phone].  Si tiene un asunto urgente cuando la clnica est cerrada y que no puede esperar hasta el siguiente da hbil, puede llamar/localizar a su doctor(a) al nmero que aparece a continuacin.   Por favor, tenga en cuenta que aunque hacemos todo lo posible para estar disponibles para asuntos urgentes fuera del horario de oficina, no estamos disponibles las 24 horas del da, los 7 das de la semana.   Si tiene un problema urgente y no puede comunicarse con nosotros, puede optar por buscar atencin mdica  en el consultorio de su doctor(a), en una clnica privada, en un centro de atencin urgente o en una sala de emergencias.  Si tiene una emergencia mdica, por favor llame inmediatamente al 911 o vaya a la sala de emergencias.  Nmeros de bper  - Dr. Kowalski: [phone]  - Dra. Moye: [phone]  - Dra. Stewart: [phone]  En caso de inclemencias del tiempo, por favor llame a nuestra lnea principal al [phone] para una actualizacin sobre el estado de cualquier retraso o cierre.  Consejos para la medicacin en dermatologa: Por favor, guarde las cajas en las que vienen los medicamentos de uso tpico para ayudarle a seguir las instrucciones sobre dnde y cmo usarlos. Las farmacias generalmente imprimen las instrucciones del medicamento slo en las cajas y no directamente en los tubos del medicamento.   Si su medicamento es muy caro, por favor, pngase en contacto con nuestra oficina llamando al [phone] y presione la opcin 4 o envenos un mensaje a travs de MyChart.   No podemos decirle cul ser su copago por los medicamentos por adelantado ya que esto es diferente dependiendo de la cobertura de su seguro. Sin embargo, es posible que podamos encontrar un medicamento sustituto a menor costo o llenar un formulario para que el  seguro cubra el medicamento que se considera necesario.   Si se requiere una autorizacin previa para que su compaa de seguros cubra su medicamento, por favor permtanos de 1 a 2 das hbiles para completar este proceso.  Los precios de los medicamentos varan con frecuencia dependiendo del lugar de dnde se surte la receta y alguna farmacias pueden ofrecer precios ms baratos.  El sitio web www.goodrx.com tiene cupones para medicamentos de diferentes farmacias. Los precios aqu no tienen en cuenta lo que podra costar con la ayuda del seguro (puede ser ms barato con su seguro), pero el sitio web puede darle el precio si no utiliz ningn seguro.  - Puede imprimir el cupn correspondiente y llevarlo con su receta a la farmacia.  - Tambin puede pasar por nuestra oficina durante el horario de atencin regular y recoger una tarjeta de cupones de GoodRx.  - Si necesita que su receta se enve electrnicamente a una farmacia diferente, informe a nuestra oficina a travs de MyChart de    o por telfono llamando al [phone] y presione la opcin 4.  

## 2022-09-25 NOTE — Progress Notes (Signed)
New Patient Visit  Subjective  Samuel Flynn is a 47 y.o. male who presents for the following: Irregular skin lesions (On the eyelids and B/L axilla - irritated, patient would like them removed due to irritation). The patient has spots, moles and lesions to be evaluated, some may be new or changing.  The following portions of the chart were reviewed this encounter and updated as appropriate:   Tobacco  Allergies  Meds  Problems  Med Hx  Surg Hx  Fam Hx     Review of Systems:  No other skin or systemic complaints except as noted in HPI or Assessment and Plan.  Objective  Well appearing patient in no apparent distress; mood and affect are within normal limits.  A focused examination was performed including the face, trunk, and extremities. Relevant physical exam findings are noted in the Assessment and Plan.  R axilla x 1, L axilla x 1 (2) Fleshy, skin-colored pedunculated papules.    R upper and lower eyelid margin x 3, L upper eyelid margin x 2 (5) Erythematous stuck-on, waxy papule or plaque  R peri orbital x 2, L peri orbital area x 1, L neck x 1 (4) Erythematous stuck-on, waxy papule or plaque   Assessment & Plan  Skin tag (2) R axilla x 1, L axilla x 1 Symptomatic, irritating, patient would like treated.  Acrochordons (Skin Tags) - Removal desired by patient - Fleshy, skin-colored pedunculated papules - Benign appearing.  - Patient desires removal. Reviewed that this is not covered by insurance and they will be charged a cosmetic fee for removal. Patient signed non-covered consent.  - Prior to the procedure, reviewed the expected small wound. Also reviewed the risk of leaving a small scar and the small risk of infection.  PROCEDURE - The areas were prepped with isopropyl alcohol. A small amount of lidocaine 1% with epinephrine was injected at the base of each lesion to achieve good local anesthesia. The skin tags were removed using a snip technique. Aluminum  chloride was used for hemostasis. Petrolatum and a bandage were applied. The procedure was tolerated well. - Wound care was reviewed with the patient. They were advised to call with any concerns. Total number of treated acrochordons 2   Inflamed seborrheic keratosis (5) R upper and lower eyelid margin x 3, L upper eyelid margin x 2 Symptomatic, irritating, patient would like treated.  Destruction of lesion - R upper and lower eyelid margin x 3, L upper eyelid margin x 2 Complexity: simple   Destruction method: cryotherapy   Informed consent: discussed and consent obtained   Timeout:  patient name, date of birth, surgical site, and procedure verified Lesion destroyed using liquid nitrogen: Yes   Region frozen until ice ball extended beyond lesion: Yes   Outcome: patient tolerated procedure well with no complications   Post-procedure details: wound care instructions given    Seborrheic keratosis, inflamed (4) R peri orbital x 2, L peri orbital area x 1, L neck x 1 Symptomatic, irritating, patient would like treated.  Destruction of lesion - R peri orbital x 2, L peri orbital area x 1, L neck x 1 Complexity: simple   Destruction method: cryotherapy   Informed consent: discussed and consent obtained   Timeout:  patient name, date of birth, surgical site, and procedure verified Lesion destroyed using liquid nitrogen: Yes   Region frozen until ice ball extended beyond lesion: Yes   Outcome: patient tolerated procedure well with no complications  Post-procedure details: wound care instructions given    Seborrheic Keratoses - Stuck-on, waxy, tan-brown papules and/or plaques  - Benign-appearing - Discussed benign etiology and prognosis. - Observe - Call for any changes  Return if symptoms worsen or fail to improve.  Luther Redo, CMA, am acting as scribe for Sarina Ser, MD . Documentation: I have reviewed the above documentation for accuracy and completeness, and I agree with  the above.  Sarina Ser, MD

## 2022-10-03 ENCOUNTER — Encounter: Payer: Self-pay | Admitting: Dermatology

## 2022-10-05 ENCOUNTER — Other Ambulatory Visit: Payer: Self-pay | Admitting: Family Medicine

## 2022-10-05 DIAGNOSIS — K219 Gastro-esophageal reflux disease without esophagitis: Secondary | ICD-10-CM

## 2022-10-05 DIAGNOSIS — Z419 Encounter for procedure for purposes other than remedying health state, unspecified: Secondary | ICD-10-CM | POA: Diagnosis not present

## 2022-10-05 NOTE — Telephone Encounter (Signed)
Requested Prescriptions  Pending Prescriptions Disp Refills   pantoprazole (PROTONIX) 40 MG tablet [Pharmacy Med Name: Pantoprazole Sodium 40 MG Oral Tablet Delayed Release] 90 tablet 0    Sig: Take 1 tablet by mouth once daily     Gastroenterology: Proton Pump Inhibitors Passed - 10/05/2022 12:28 PM      Passed - Valid encounter within last 12 months    Recent Outpatient Visits           2 weeks ago Type 2 diabetes mellitus without complication, without long-term current use of insulin ([place])   [place] [facility], Clyde Canterbury, MD   5 months ago Type 2 diabetes mellitus without complication, without long-term current use of insulin ([place])   [place] [facility], Clyde Canterbury, MD   6 months ago Type 2 diabetes mellitus without complication, without long-term current use of insulin ([place])   [place] [facility], Clyde Canterbury, MD   1 year ago Type 2 diabetes mellitus without complication, without long-term current use of insulin [facility])   [place] [facility], MD   1 year ago Hospital discharge follow-up   [facility], Flonnie Hailstone, NP       Future Appointments             In 5 months Dorna Mai, MD [facility]

## 2022-10-12 ENCOUNTER — Encounter (HOSPITAL_COMMUNITY): Payer: Self-pay | Admitting: *Deleted

## 2022-10-26 ENCOUNTER — Ambulatory Visit (INDEPENDENT_AMBULATORY_CARE_PROVIDER_SITE_OTHER): Payer: Medicaid Other | Admitting: Dermatology

## 2022-10-26 ENCOUNTER — Other Ambulatory Visit: Payer: Self-pay | Admitting: Dermatology

## 2022-10-26 VITALS — BP 118/69 | HR 50

## 2022-10-26 DIAGNOSIS — D492 Neoplasm of unspecified behavior of bone, soft tissue, and skin: Secondary | ICD-10-CM

## 2022-10-26 DIAGNOSIS — L820 Inflamed seborrheic keratosis: Secondary | ICD-10-CM

## 2022-10-26 DIAGNOSIS — D2221 Melanocytic nevi of right ear and external auricular canal: Secondary | ICD-10-CM

## 2022-10-26 NOTE — Progress Notes (Signed)
   Follow-Up Visit   Subjective  Samuel Flynn is a 47 y.o. male who presents for the following: ISK f/u (R upper and lower eyelid margin x 3, L upper eyelid margin x 2, some others new on scalp). The patient has spots, moles and lesions to be evaluated, some may be new or changing and the patient has concerns that these could be cancer.  The following portions of the chart were reviewed this encounter and updated as appropriate:   Tobacco  Allergies  Meds  Problems  Med Hx  Surg Hx  Fam Hx     Review of Systems:  No other skin or systemic complaints except as noted in HPI or Assessment and Plan.  Objective  Well appearing patient in no apparent distress; mood and affect are within normal limits.  A focused examination was performed including face, scalp. Relevant physical exam findings are noted in the Assessment and Plan.  L upper eyelid x 3, R eyelid x 3, L nasal bridge x 2 (8) Stuck on waxy paps with erythema  Right Ear brown pap 0.6cm      Assessment & Plan  Inflamed seborrheic keratosis (8) L upper eyelid x 3, R eyelid x 3, L nasal bridge x 2 Symptomatic, irritating, patient would like treated. Destruction of lesion - L upper eyelid x 3, R eyelid x 3, L nasal bridge x 2 Complexity: simple   Destruction method: cryotherapy   Informed consent: discussed and consent obtained   Timeout:  patient name, date of birth, surgical site, and procedure verified Lesion destroyed using liquid nitrogen: Yes   Region frozen until ice ball extended beyond lesion: Yes   Outcome: patient tolerated procedure well with no complications   Post-procedure details: wound care instructions given    Seborrheic keratosis, inflamed (2) L upper eyelid margin lat x 1, R upper eyelid margin x 1 Symptomatic, irritating, patient would like treated. Destruction of lesion - L upper eyelid margin lat x 1, R upper eyelid margin x 1 Complexity: simple   Destruction method: cryotherapy    Informed consent: discussed and consent obtained   Timeout:  patient name, date of birth, surgical site, and procedure verified Lesion destroyed using liquid nitrogen: Yes   Region frozen until ice ball extended beyond lesion: Yes   Outcome: patient tolerated procedure well with no complications   Post-procedure details: wound care instructions given    Neoplasm of skin Right Ear/ postauricular Epidermal / dermal shaving  Lesion diameter (cm):  0.6 Informed consent: discussed and consent obtained   Timeout: patient name, date of birth, surgical site, and procedure verified   Procedure prep:  Patient was prepped and draped in usual sterile fashion Prep type:  Isopropyl alcohol Anesthesia: the lesion was anesthetized in a standard fashion   Anesthetic:  1% lidocaine w/ epinephrine 1-100,000 buffered w/ 8.4% NaHCO3 Instrument used: flexible razor blade   Hemostasis achieved with: pressure, aluminum chloride and electrodesiccation   Outcome: patient tolerated procedure well   Post-procedure details: sterile dressing applied and wound care instructions given   Dressing type: bandage and bacitracin    Specimen 1 - Surgical pathology Differential Diagnosis: D48.5 Irritated nevus r/o Atypia Check Margins: yes brown pap 0.6cm  Return if symptoms worsen or fail to improve.  I, Othelia Pulling, RMA, am acting as scribe for Sarina Ser, MD . Documentation: I have reviewed the above documentation for accuracy and completeness, and I agree with the above.  Sarina Ser, MD

## 2022-10-26 NOTE — Patient Instructions (Addendum)
Wound Care Instructions  Cleanse wound gently with soap and water once a day then pat dry with clean gauze. Apply a thin coat of Petrolatum (petroleum jelly, "Vaseline") over the wound (unless you have an allergy to this). We recommend that you use a new, sterile tube of Vaseline. Do not pick or remove scabs. Do not remove the yellow or white "healing tissue" from the base of the wound.  Cover the wound with fresh, clean, nonstick gauze and secure with paper tape. You may use Band-Aids in place of gauze and tape if the wound is small enough, but would recommend trimming much of the tape off as there is often too much. Sometimes Band-Aids can irritate the skin.  You should call the office for your biopsy report after 1 week if you have not already been contacted.  If you experience any problems, such as abnormal amounts of bleeding, swelling, significant bruising, significant pain, or evidence of infection, please call the office immediately.  FOR ADULT SURGERY PATIENTS: If you need something for pain relief you may take 1 extra strength Tylenol (acetaminophen) AND 2 Ibuprofen (200mg each) together every 4 hours as needed for pain. (do not take these if you are allergic to them or if you have a reason you should not take them.) Typically, you may only need pain medication for 1 to 3 days.     Cryotherapy Aftercare  Wash gently with soap and water everyday.   Apply Vaseline and Band-Aid daily until healed.    Due to recent changes in healthcare laws, you may see results of your pathology and/or laboratory studies on MyChart before the doctors have had a chance to review them. We understand that in some cases there may be results that are confusing or concerning to you. Please understand that not all results are received at the same time and often the doctors may need to interpret multiple results in order to provide you with the best plan of care or course of treatment. Therefore, we ask that you  please give us 2 business days to thoroughly review all your results before contacting the office for clarification. Should we see a critical lab result, you will be contacted sooner.   If You Need Anything After Your Visit  If you have any questions or concerns for your doctor, please call our main line at [phone] and press option 4 to reach your doctor's medical assistant. If no one answers, please leave a voicemail as directed and we will return your call as soon as possible. Messages left after 4 pm will be answered the following business day.   You may also send us a message via MyChart. We typically respond to MyChart messages within 1-2 business days.  For prescription refills, please ask your pharmacy to contact our office. Our fax number is [phone].  If you have an urgent issue when the clinic is closed that cannot wait until the next business day, you can page your doctor at the number below.    Please note that while we do our best to be available for urgent issues outside of office hours, we are not available 24/7.   If you have an urgent issue and are unable to reach us, you may choose to seek medical care at your doctor's office, retail clinic, urgent care center, or emergency room.  If you have a medical emergency, please immediately call 911 or go to the emergency department.  Pager Numbers  - Dr. Kowalski: [phone]  -   [phone]  - Dr. Laurence Ferrari: [phone]  - Dr. Nicole Kindred: [phone]  In the event of inclement weather, please call our main line at [phone] for an update on the status of any delays or closures.  Dermatology Medication Tips: Please keep the boxes that topical medications come in in order to help keep track of the instructions about where and how to use these. Pharmacies typically print the medication instructions only on the boxes and not directly on the medication tubes.   If your medication is too expensive, please contact our office at  [phone] option 4 or send [place] a message through [place].   We are unable to tell what your co-pay for medications will be in advance as this is different depending on your insurance coverage. However, we may be able to find a substitute medication at lower cost or fill out paperwork to get insurance to cover a needed medication.   If a prior authorization is required to get your medication covered by your insurance company, please allow [place] 1-2 business days to complete this process.  Drug prices often vary depending on where the prescription is filled and some pharmacies may offer cheaper prices.  The website www.goodrx.com contains coupons for medications through different pharmacies. The prices here do not account for what the cost may be with help from insurance (it may be cheaper with your insurance), but the website can give you the price if you did not use any insurance.  - You can print the associated coupon and take it with your prescription to the pharmacy.  - You may also stop by our office during regular business hours and pick up a GoodRx coupon card.  - If you need your prescription sent electronically to a different pharmacy, notify our office through [facility] or by phone at [phone] option 4.     Si Usted Necesita Algo Despus de Su Visita  Tambin puede enviarnos un mensaje a travs de Pharmacist, community. Por lo general respondemos a los mensajes de MyChart en el transcurso de 1 a 2 das hbiles.  Para renovar recetas, por favor pida a su farmacia que se ponga en contacto con nuestra oficina. Harland Dingwall de fax es [place] [phone].  Si tiene un asunto urgente cuando la clnica est cerrada y que no puede esperar hasta el siguiente da hbil, puede llamar/localizar a su doctor(a) al nmero que aparece a continuacin.   Por favor, tenga en cuenta que aunque hacemos todo lo posible para estar disponibles para asuntos urgentes fuera del horario de [place], no estamos  disponibles las 24 horas del da, los 7 das de la [place].   Si tiene un problema urgente y no puede comunicarse con nosotros, puede optar por buscar atencin mdica  en el consultorio de su doctor(a), en una clnica privada, en un centro de atencin urgente o en una sala de emergencias.  Si tiene Engineering geologist, por favor llame inmediatamente al 911 o vaya a la sala de emergencias.  Nmeros de bper  - Dr. Nehemiah Massed: [phone]  - Dra. Moye: [phone]  - Dra. Nicole Kindred: [phone]  En caso de inclemencias del [place], por favor llame a Johnsie Kindred principal al [phone] para una actualizacin sobre el [place] de cualquier retraso o cierre.  Consejos para la medicacin en dermatologa: Por favor, guarde las cajas en las que vienen los medicamentos de uso tpico para ayudarle a seguir las instrucciones sobre dnde y cmo usarlos. Las farmacias generalmente imprimen las instrucciones del medicamento slo en  las cajas y no directamente en los tubos del medicamento.   Si su medicamento es muy caro, por favor, pngase en contacto con nuestra oficina llamando al [phone] y presione la opcin 4 o envenos un mensaje a travs de MyChart.   No podemos decirle cul ser su copago por los medicamentos por adelantado ya que esto es diferente dependiendo de la cobertura de su seguro. Sin embargo, es posible que podamos encontrar un medicamento sustituto a menor costo o llenar un formulario para que el seguro cubra el medicamento que se considera necesario.   Si se requiere una autorizacin previa para que su compaa de seguros cubra su medicamento, por favor permtanos de 1 a 2 das hbiles para completar este proceso.  Los precios de los medicamentos varan con frecuencia dependiendo del lugar de dnde se surte la receta y alguna farmacias pueden ofrecer precios ms baratos.  El sitio web www.goodrx.com tiene cupones para medicamentos de diferentes farmacias. Los precios aqu no  tienen en cuenta lo que podra costar con la ayuda del seguro (puede ser ms barato con su seguro), pero el sitio web puede darle el precio si no utiliz ningn seguro.  - Puede imprimir el cupn correspondiente y llevarlo con su receta a la farmacia.  - Tambin puede pasar por nuestra oficina durante el horario de atencin regular y recoger una tarjeta de cupones de GoodRx.  - Si necesita que su receta se enve electrnicamente a una farmacia diferente, informe a nuestra oficina a travs de MyChart de [place] o por telfono llamando al [phone] y presione la opcin 4.  

## 2022-10-29 ENCOUNTER — Other Ambulatory Visit: Payer: Self-pay | Admitting: Cardiovascular Disease

## 2022-10-29 ENCOUNTER — Other Ambulatory Visit: Payer: Self-pay | Admitting: Family Medicine

## 2022-10-29 ENCOUNTER — Other Ambulatory Visit: Payer: Self-pay | Admitting: Cardiology

## 2022-10-30 ENCOUNTER — Other Ambulatory Visit (HOSPITAL_BASED_OUTPATIENT_CLINIC_OR_DEPARTMENT_OTHER): Payer: Self-pay | Admitting: Cardiovascular Disease

## 2022-10-30 DIAGNOSIS — M1000 Idiopathic gout, unspecified site: Secondary | ICD-10-CM

## 2022-10-30 NOTE — Telephone Encounter (Signed)
Prescription refill request for Xarelto received.  Indication: afib  Last office visit: Samuel Flynn 05/03/2022 Weight: 105.2 kg  Age: 47 yo Scr: 1.18, 04/25/2022 CrCl: 122 ml/min

## 2022-10-30 NOTE — Telephone Encounter (Signed)
Please review for refill. Thank you! 

## 2022-10-30 NOTE — Telephone Encounter (Signed)
Rx refill sent to pharmacy. 

## 2022-10-31 ENCOUNTER — Telehealth: Payer: Self-pay

## 2022-10-31 NOTE — Telephone Encounter (Signed)
-----   Message from Ralene Bathe, MD sent at 10/31/2022  9:10 AM EST ----- Diagnosis Skin , right ear MELANOCYTIC NEVUS, INTRADERMAL TYPE, IRRITATED, DEEP MARGIN INVOLVED  Benign irritated mole No further treatment needed

## 2022-10-31 NOTE — Telephone Encounter (Signed)
Discussed biopsy results with patient

## 2022-11-03 ENCOUNTER — Encounter: Payer: Self-pay | Admitting: Dermatology

## 2022-11-03 DIAGNOSIS — Z419 Encounter for procedure for purposes other than remedying health state, unspecified: Secondary | ICD-10-CM | POA: Diagnosis not present

## 2022-11-06 ENCOUNTER — Other Ambulatory Visit: Payer: Self-pay | Admitting: Cardiology

## 2022-11-16 DIAGNOSIS — K912 Postsurgical malabsorption, not elsewhere classified: Secondary | ICD-10-CM | POA: Diagnosis not present

## 2022-11-16 DIAGNOSIS — Z9884 Bariatric surgery status: Secondary | ICD-10-CM | POA: Diagnosis not present

## 2022-11-30 DIAGNOSIS — Z9884 Bariatric surgery status: Secondary | ICD-10-CM | POA: Diagnosis not present

## 2022-11-30 DIAGNOSIS — K449 Diaphragmatic hernia without obstruction or gangrene: Secondary | ICD-10-CM | POA: Diagnosis not present

## 2022-11-30 DIAGNOSIS — R112 Nausea with vomiting, unspecified: Secondary | ICD-10-CM | POA: Diagnosis not present

## 2022-11-30 DIAGNOSIS — R1310 Dysphagia, unspecified: Secondary | ICD-10-CM | POA: Diagnosis not present

## 2022-12-14 ENCOUNTER — Other Ambulatory Visit: Payer: Self-pay | Admitting: Cardiology

## 2022-12-14 ENCOUNTER — Other Ambulatory Visit (HOSPITAL_BASED_OUTPATIENT_CLINIC_OR_DEPARTMENT_OTHER): Payer: Self-pay | Admitting: Cardiovascular Disease

## 2022-12-14 ENCOUNTER — Other Ambulatory Visit: Payer: Self-pay | Admitting: Family Medicine

## 2022-12-14 DIAGNOSIS — M1000 Idiopathic gout, unspecified site: Secondary | ICD-10-CM

## 2022-12-14 DIAGNOSIS — K219 Gastro-esophageal reflux disease without esophagitis: Secondary | ICD-10-CM

## 2022-12-18 ENCOUNTER — Other Ambulatory Visit: Payer: Self-pay

## 2022-12-18 MED ORDER — FUROSEMIDE 40 MG PO TABS: 40.0000 mg | ORAL_TABLET | Freq: Two times a day (BID) | ORAL | 0 refills | Status: DC

## 2023-01-05 ENCOUNTER — Telehealth: Payer: Self-pay | Admitting: *Deleted

## 2023-01-05 NOTE — Telephone Encounter (Signed)
   Pre-operative Risk Assessment    Patient Name: OTHER SCHNITZER  DOB: Sep 15, 1975 MRN: [id number]      Request for Surgical Clearance    Procedure:   ENDOSCOPY  Date of Surgery:  Clearance 03/12/23                                 Surgeon: NOT LISTED Surgeon's Group or Practice Name:  [facility] Phone number:  [phone] Fax number:  [phone]   Type of Clearance Requested:   - Medical  - Pharmacy:  Hold Rivaroxaban (Xarelto) x DAYS PRIOR   Type of Anesthesia:  Not Indicated (PROPOFOL?)   Additional requests/questions:    Elpidio Anis   01/05/2023, 5:19 PM

## 2023-01-08 ENCOUNTER — Other Ambulatory Visit: Payer: Self-pay | Admitting: Cardiology

## 2023-01-09 NOTE — Telephone Encounter (Signed)
Pt has been scheduled to see Gillian Shields, NP, 01/30/23, clearance will be addressed at that time.  Will send back to the requesting surgeon's office to make them aware.

## 2023-01-09 NOTE — Telephone Encounter (Signed)
Patient with diagnosis of afib on Xarelto for anticoagulation.    Procedure: endoscopy Date of procedure: 03/12/23  CHA2DS2-VASc Score = 3  This indicates a 3.2% annual risk of stroke. The patient's score is based upon: CHF History: 1 HTN History: 1 Diabetes History: 1 Stroke History: 0 Vascular Disease History: 0 Age Score: 0 Gender Score: 0   CrCl 11mL/min using adj body weight Platelet count -none since 2022. [facility] has checked CBC multiple times but has ordered the lab without platelets for some reason. Rest of CBC is stable though.  Per office protocol, patient can hold Xarelto for 1-2 days prior to procedure.    **This guidance is not considered finalized until pre-operative APP has relayed final recommendations.**

## 2023-01-09 NOTE — Telephone Encounter (Signed)
   Name: Samuel Flynn  DOB: 1976-05-30  MRN: [id number]  Primary Cardiologist: Chilton Si, MD  Chart reviewed as part of pre-operative protocol coverage. Because of Samuel Flynn's past medical history and time since last visit, he will require a follow-up in-office visit in order to better assess preoperative cardiovascular risk.  Pre-op covering staff: - Please schedule appointment and call patient to inform them. If patient already had an upcoming appointment within acceptable timeframe, please add "pre-op clearance" to the appointment notes so provider is aware. - Please contact requesting surgeon's office via preferred method (i.e, phone, fax) to inform them of need for appointment prior to surgery.  This message will also be routed to pharmacy pool  for input on holding Xarelto as requested below so that this information is available to the clearing provider at time of patient's appointment.   Carlos Levering, NP  01/09/2023, 11:00 AM

## 2023-01-21 ENCOUNTER — Other Ambulatory Visit (HOSPITAL_BASED_OUTPATIENT_CLINIC_OR_DEPARTMENT_OTHER): Payer: Self-pay | Admitting: Cardiovascular Disease

## 2023-01-21 ENCOUNTER — Other Ambulatory Visit: Payer: Self-pay | Admitting: Family Medicine

## 2023-01-21 ENCOUNTER — Other Ambulatory Visit: Payer: Self-pay | Admitting: Cardiology

## 2023-01-21 DIAGNOSIS — M1000 Idiopathic gout, unspecified site: Secondary | ICD-10-CM

## 2023-01-22 ENCOUNTER — Other Ambulatory Visit: Payer: Self-pay | Admitting: *Deleted

## 2023-01-25 ENCOUNTER — Other Ambulatory Visit: Payer: Self-pay

## 2023-01-25 MED ORDER — DOFETILIDE 500 MCG PO CAPS: 500.0000 ug | ORAL_CAPSULE | Freq: Two times a day (BID) | ORAL | 0 refills | Status: DC

## 2023-01-29 ENCOUNTER — Other Ambulatory Visit (HOSPITAL_BASED_OUTPATIENT_CLINIC_OR_DEPARTMENT_OTHER): Payer: Self-pay | Admitting: Cardiovascular Disease

## 2023-01-29 DIAGNOSIS — M1000 Idiopathic gout, unspecified site: Secondary | ICD-10-CM

## 2023-01-30 ENCOUNTER — Ambulatory Visit (HOSPITAL_BASED_OUTPATIENT_CLINIC_OR_DEPARTMENT_OTHER): Payer: Medicare Other | Admitting: Family

## 2023-01-30 NOTE — Progress Notes (Deleted)
Office Visit    Patient Name: Samuel Flynn Date of Encounter: 01/30/2023  PCP:  Georganna Skeans, MD   [facility]  Cardiologist:  Chilton Si, MD  Advanced Practice Provider:  No care team member to display Electrophysiologist:  Will Jorja Loa, MD      Chief Complaint    Samuel Flynn is a 47 y.o. male presents today for preop clearance  Past Medical History    Past Medical History:  Diagnosis Date   Atrial fibrillation with RVR (HCC)    Gastric ulcer    Gout    Hypertension    Paroxysmal A-fib (HCC)    Sleep apnea    USES CPAP   Wears glasses    Past Surgical History:  Procedure Laterality Date   ATRIAL FIBRILLATION ABLATION N/A 04/26/2018   Procedure: ATRIAL FIBRILLATION ABLATION;  Surgeon: Regan Lemming, MD;  Location: [facility] INVASIVE CV LAB;  Service: Cardiovascular;  Laterality: N/A;   ATRIAL FIBRILLATION ABLATION N/A 11/07/2019   Procedure: ATRIAL FIBRILLATION ABLATION;  Surgeon: Regan Lemming, MD;  Location: [facility] INVASIVE CV LAB;  Service: Cardiovascular;  Laterality: N/A;   CARDIOVERSION N/A 01/18/2018   Procedure: CARDIOVERSION;  Surgeon: Laurey Morale, MD;  Location: [facility] ENDOSCOPY;  Service: Cardiovascular;  Laterality: N/A;   CARDIOVERSION N/A 08/19/2019   Procedure: CARDIOVERSION;  Surgeon: Vesta Mixer, MD;  Location: [facility] ENDOSCOPY;  Service: Cardiovascular;  Laterality: N/A;   CARDIOVERSION N/A 04/18/2021   Procedure: CARDIOVERSION;  Surgeon: Elease Hashimoto Deloris Ping, MD;  Location: [facility] ENDOSCOPY;  Service: Cardiovascular;  Laterality: N/A;   ELBOW LIGAMENT RECONSTRUCTION Right 09/29/2014   Procedure: Tomma Lightning FRACTURE FIXATION, POSSIBLE LIGAMENT REPAIR;  Surgeon: Mable Paris, MD;  Location: [facility];  Service: Orthopedics;  Laterality: Right;  Right elbow coranoid fracture fixation, possible ligament repair   TEE WITHOUT CARDIOVERSION N/A 01/18/2018   Procedure: TRANSESOPHAGEAL  ECHOCARDIOGRAM (TEE);  Surgeon: Laurey Morale, MD;  Location: [facility] ENDOSCOPY;  Service: Cardiovascular;  Laterality: N/A;   TEE WITHOUT CARDIOVERSION N/A 04/18/2021   Procedure: TRANSESOPHAGEAL ECHOCARDIOGRAM (TEE);  Surgeon: Vesta Mixer, MD;  Location: [facility] ENDOSCOPY;  Service: Cardiovascular;  Laterality: N/A;   WISDOM TOOTH EXTRACTION      Allergies  No Known Allergies  History of Present Illness    [name] is a 47 y.o. male with a hx of atrial flutter, atrial fibrillation s/p ablation, OSA, HFpEF, DM2, HTN, gout last seen via phone visit 05/03/2022.  Prior ablation 04/26/2018.  Required repeat ablation 11/07/2019.  Echo 04/2021 LVEF 55 to 6%, no RWMA, mild LVH, no valvular disease.  Presented to ED in atrial flutter 06/22/2021 with 1-1 atrial flutter thought to be due to flecainide.  He had cardioversion in the emergency room.  He was subsequently loaded on dofetilide.    Last seen in clinic by Dr. Elberta Fortis 09/20/2021.  He was maintaining sinus rhythm on flecainide.  Diltiazem was discontinued he was started on Toprol 100 mg daily for BP control.   Presents today for follow up. EKG shows atrial flutter 151 bpm. Reports feeling like he was out of rhythm since last Wednesday. Did not contact office - but noted palpitations when we called him to discuss returning ZIO monitor. Endorses palpitations associated with chest pain, dyspnea. Does not have way to check BP or HR at home.  EKGs/Labs/Other Studies Reviewed:   The following studies were reviewed today: Cardiac Studies & Procedures     STRESS TESTS  EXERCISE TOLERANCE TEST (ETT) 02/07/2018  Narrative  Blood pressure demonstrated a normal response to exercise.  Clinically negative. The patient experienced no chest pain. Stopped due to cramping in legs  Electrically nondiagnostic as patient did not achieve target heart rate or adequate pressure rate product.   ECHOCARDIOGRAM  ECHOCARDIOGRAM COMPLETE  04/16/2021  Narrative ECHOCARDIOGRAM REPORT    Patient Name:   Samuel Flynn [facility] Date of Exam: 04/16/2021 Medical Rec #:  [id number]          Height:       74.0 in Accession #:    [id number]         Weight:       315.9 lb Date of Birth:  03-21-1976         BSA:          2.640 m Patient Age:    44 years           BP:           114/79 mmHg Patient Gender: M                  HR:           94 bpm. Exam Location:  Inpatient  Procedure: 2D Echo, Cardiac Doppler, Color Doppler and Intracardiac Opacification Agent  Indications:    Atrial Fibrillation I48.91  History:        Patient has prior history of Echocardiogram examinations, most recent 07/14/2019. Arrythmias:Atrial Fibrillation; Risk Factors:Hypertension, Sleep Apnea and Former Smoker.  Sonographer:    Renella Cunas RDCS Referring Phys: [phone] [facility] N DUNN   Sonographer Comments: Image acquisition challenging due to patient body habitus. IMPRESSIONS   1. Left ventricular ejection fraction, by estimation, is 55 to 60%. The left ventricle has normal function. The left ventricle has no regional wall motion abnormalities. The left ventricular internal cavity size was mildly dilated. There is mild left ventricular hypertrophy. Left ventricular diastolic parameters are indeterminate. 2. Right ventricular systolic function is normal. The right ventricular size is normal. 3. The mitral valve is normal in structure. No evidence of mitral valve regurgitation. No evidence of mitral stenosis. 4. The aortic valve is tricuspid. Aortic valve regurgitation is not visualized. No aortic stenosis is present. 5. The inferior vena cava is normal in size with greater than 50% respiratory variability, suggesting right atrial pressure of 3 mmHg.  FINDINGS Left Ventricle: Left ventricular ejection fraction, by estimation, is 55 to 60%. The left ventricle has normal function. The left ventricle has no regional wall motion abnormalities. Definity contrast  agent was given IV to delineate the left ventricular endocardial borders. The left ventricular internal cavity size was mildly dilated. There is mild left ventricular hypertrophy. Left ventricular diastolic parameters are indeterminate.  Right Ventricle: The right ventricular size is normal. Right ventricular systolic function is normal.  Left Atrium: Left atrial size was normal in size.  Right Atrium: Right atrial size was normal in size.  Pericardium: There is no evidence of pericardial effusion.  Mitral Valve: The mitral valve is normal in structure. No evidence of mitral valve regurgitation. No evidence of mitral valve stenosis.  Tricuspid Valve: The tricuspid valve is normal in structure. Tricuspid valve regurgitation is trivial. No evidence of tricuspid stenosis.  Aortic Valve: The aortic valve is tricuspid. Aortic valve regurgitation is not visualized. No aortic stenosis is present.  Pulmonic Valve: The pulmonic valve was not well visualized. Pulmonic valve regurgitation is not visualized. No evidence of pulmonic stenosis.  Aorta: The aortic  root is normal in size and structure.  Venous: The inferior vena cava is normal in size with greater than 50% respiratory variability, suggesting right atrial pressure of 3 mmHg.  IAS/Shunts: No atrial level shunt detected by color flow Doppler.   LEFT VENTRICLE PLAX 2D LVIDd:         5.90 cm      Diastology LVIDs:         4.70 cm      LV e' medial:    6.83 cm/s LV PW:         1.20 cm      LV E/e' medial:  7.8 LV IVS:        1.20 cm      LV e' lateral:   6.30 cm/s LVOT diam:     2.60 cm      LV E/e' lateral: 8.5 LV SV:         49 LV SV Index:   18 LVOT Area:     5.31 cm  LV Volumes (MOD) LV vol d, MOD A4C: 152.0 ml LV vol s, MOD A4C: 61.8 ml LV SV MOD A4C:     152.0 ml  RIGHT VENTRICLE RV S prime:     8.48 cm/s TAPSE (M-mode): 1.2 cm  LEFT ATRIUM             Index       RIGHT ATRIUM           Index LA diam:        4.40 cm  1.67 cm/m  RA Area:     17.00 cm LA Vol (A2C):   44.1 ml 16.71 ml/m RA Volume:   42.40 ml  16.06 ml/m LA Vol (A4C):   36.7 ml 13.90 ml/m LA Biplane Vol: 41.2 ml 15.61 ml/m AORTIC VALVE LVOT Vmax:   51.80 cm/s LVOT Vmean:  36.300 cm/s LVOT VTI:    0.092 m  AORTA Ao Root diam: 3.70 cm Ao Asc diam:  3.40 cm  MITRAL VALVE MV Area (PHT): 5.79 cm    SHUNTS MV Decel Time: 131 msec    Systemic VTI:  0.09 m MV E velocity: 53.50 cm/s  Systemic Diam: 2.60 cm MV A velocity: 47.40 cm/s MV E/A ratio:  1.13  Olga Millers MD Electronically signed by Olga Millers MD Signature Date/Time: 04/16/2021/10:32:15 AM    Final   TEE  ECHO TEE 04/18/2021  Narrative TRANSESOPHOGEAL ECHO REPORT    Patient Name:   [facility] Date of Exam: 04/18/2021 Medical Rec #:  [id number]          Height:       74.0 in Accession #:    [id number]         Weight:       315.0 lb Date of Birth:  08/15/1976         BSA:          2.636 m Patient Age:    44 years           BP:           119/58 mmHg Patient Gender: M                  HR:           114 bpm. Exam Location:  Inpatient  Procedure: Transesophageal Echo and Color Doppler  Indications:     I48.91* Unspecified atrial fibrillation  History:         Patient has  prior history of Echocardiogram examinations, most recent 04/16/2021. CHF, Arrythmias:Atrial Fibrillation; Risk Factors:Sleep Apnea and Hypertension.  Sonographer:     Irving Burton Senior RDCS Referring Phys:  [id number] [facility] A Izora Ribas Diagnosing Phys: Kristeen Miss MD  PROCEDURE: After discussion of the risks and benefits of a TEE, an informed consent was obtained from the patient. The transesophogeal probe was passed without difficulty through the esophogus of the patient. Sedation performed by different physician. The patient was monitored while under deep sedation. Anesthestetic sedation was provided intravenously by Anesthesiology: 484mg  of Propofol, 100mg  of Lidocaine. The  patient developed no complications during the procedure. A successful direct current cardioversion was performed at 200 joules with 1 attempt.  IMPRESSIONS   1. Left ventricular ejection fraction, by estimation, is 60 to 65%. The left ventricle has normal function. 2. Right ventricular systolic function is normal. The right ventricular size is normal. 3. No left atrial/left atrial appendage thrombus was detected. 4. The mitral valve is normal in structure. Mild mitral valve regurgitation. 5. The aortic valve is grossly normal. Aortic valve regurgitation is trivial.  FINDINGS Left Ventricle: Left ventricular ejection fraction, by estimation, is 60 to 65%. The left ventricle has normal function. The left ventricular internal cavity size was normal in size.  Right Ventricle: The right ventricular size is normal. No increase in right ventricular wall thickness. Right ventricular systolic function is normal.  Left Atrium: Left atrial size was normal in size. No left atrial/left atrial appendage thrombus was detected.  Right Atrium: Right atrial size was normal in size.  Pericardium: There is no evidence of pericardial effusion.  Mitral Valve: The mitral valve is normal in structure. Mild mitral valve regurgitation.  Tricuspid Valve: The tricuspid valve is grossly normal. Tricuspid valve regurgitation is trivial.  Aortic Valve: The aortic valve is grossly normal. Aortic valve regurgitation is trivial.  Pulmonic Valve: The pulmonic valve was not well visualized. Pulmonic valve regurgitation is not visualized.  Aorta: The aortic root and ascending aorta are structurally normal, with no evidence of dilitation.  IAS/Shunts: The atrial septum is grossly normal.  Kristeen Miss MD Electronically signed by Kristeen Miss MD Signature Date/Time: 04/18/2021/3:33:47 PM    Final            _____________  EKG:  EKG is ordered today.  The ekg ordered today demonstrates atrial flutter 151  bpm.***  Recent Labs: No results found for requested labs within last 365 days.  Recent Lipid Panel    Component Value Date/Time   CHOL 203 (H) 04/16/2021 0413   TRIG 428 (H) 04/16/2021 0413   HDL 24 (L) 04/16/2021 0413   CHOLHDL 8.5 04/16/2021 0413   VLDL UNABLE TO CALCULATE IF TRIGLYCERIDE OVER 400 mg/dL 57/84/6962 9528   LDLCALC UNABLE TO CALCULATE IF TRIGLYCERIDE OVER 400 mg/dL 41/32/4401 0272   LDLDIRECT 107.0 (H) 04/16/2021 0413      Home Medications   No outpatient medications have been marked as taking for the 01/30/23 encounter (Appointment) with Alver Sorrow, NP.     Review of Systems      All other systems reviewed and are otherwise negative except as noted above.  Physical Exam    VS:  There were no vitals taken for this visit. , BMI There is no height or weight on file to calculate BMI.  Wt Readings from Last 3 Encounters:  09/15/22 232 lb (105.2 kg)  04/25/22 (!) 301 lb 3.2 oz (136.6 kg)  03/16/22 295 lb 3.2 oz (133.9 kg)  GEN: Well nourished, well developed, in no acute distress. HEENT: normal. Neck: Supple, no JVD, carotid bruits, or masses. Cardiac: irregularly irreguar and tachycardic, no murmurs, rubs, or gallops. No clubbing, cyanosis, edema.  Radials/PT 2+ and equal bilaterally.  Respiratory:  Respirations regular and unlabored, clear to auscultation bilaterally. GI: Soft, nontender, nondistended. MS: No deformity or atrophy. Skin: Warm and dry, no rash. Neuro:  Strength and sensation are intact. Psych: Normal affect.  Assessment & Plan    Atrial flutter / Atrial fibrillation - EKG today shows atrial flutter 151  bpm. No availability for outpatient DCCV this week. Recommend evaluation in ED for consideration of DCCV. Follow up with Dr. Elberta Fortis 06/27/21 as scheduled. Continue Xarelto 20mg  QD due to CHads2vasc of at least 3 (DM2, HTN, HF). Denies bleeding complications. No missed doses over last 3 weeks. Will likely discuss AAD at upcoming  visit with Dr. Elberta Fortis.   HTN - BP well controlled. Continue current antihypertensive regimen.    Diastolic heart failure - Weight down 14 lbs. Continue Lasix 40mg  QD with additional 40mg  PRN for weight gain 2 lbs overnight or 5 lbs in 1 week.  GDMT includes Lasix, Farxiga. Low salt diet, fluid restriction <2L encouraged.   DM2 - 04/15/21 A1c 6.9. Marcelline Deist started during recent admission for additional HF benefit. Continue to follow with PCP.   OSA - CPAP compliance encouraged.   Gout - Continue to follow with PCP. Uric acid level ordered at clinic visit 05/05/21 but he did not have collected. Further refills of colchicine will need to come from primary care provider. Try to avoid prednisone given arrhythmia history.  Disposition: Patient sent to ED for consideration of cardioversion. Follow up as scheduled with Dr. Elberta Fortis  Signed, Alver Sorrow, NP 01/30/2023, 2:47 PM [facility]

## 2023-02-01 NOTE — Telephone Encounter (Signed)
Our office received a duplicate request stating 2nd request. I reviewed the pt's chart. Pt was supposed to see Gillian Shields, NP 01/30/23 @ 2:45. Pt did not show up for the appt. I tried to call the requesting office, though in the phone que I 6 in line. I have update the clearance request and I will fax this to the requesting office. The pt is going to need to call the office [phone] and schedule an IN OFFICE appt for pre op clearance.

## 2023-02-13 NOTE — Progress Notes (Signed)
Cardiology Clinic Note   Patient Name: Samuel Flynn Date of Encounter: 02/20/2023  Primary Care Provider:  Georganna Skeans, MD Primary Cardiologist:  Chilton Si, MD  Patient Profile    47 year old male with history of atrial fibrillation status post PVI ablation in 2019 2021, atrial flutter status post DCCV in 2022, on dofetilide and Xarelto, CHA2DS2-VASc score 3, HFpEF, OSA, type 2 diabetes, hypertension, and gout.   Past Medical History    Past Medical History:  Diagnosis Date   Atrial fibrillation with RVR (HCC)    Gastric ulcer    Gout    Hypertension    Paroxysmal A-fib (HCC)    Sleep apnea    USES CPAP   Wears glasses    Past Surgical History:  Procedure Laterality Date   ATRIAL FIBRILLATION ABLATION N/A 04/26/2018   Procedure: ATRIAL FIBRILLATION ABLATION;  Surgeon: Regan Lemming, MD;  Location: [facility];  Service: Cardiovascular;  Laterality: N/A;   ATRIAL FIBRILLATION ABLATION N/A 11/07/2019   Procedure: ATRIAL FIBRILLATION ABLATION;  Surgeon: Regan Lemming, MD;  Location: [facility];  Service: Cardiovascular;  Laterality: N/A;   CARDIOVERSION N/A 01/18/2018   Procedure: CARDIOVERSION;  Surgeon: Laurey Morale, MD;  Location: [facility] ENDOSCOPY;  Service: Cardiovascular;  Laterality: N/A;   CARDIOVERSION N/A 08/19/2019   Procedure: CARDIOVERSION;  Surgeon: Vesta Mixer, MD;  Location: [facility] ENDOSCOPY;  Service: Cardiovascular;  Laterality: N/A;   CARDIOVERSION N/A 04/18/2021   Procedure: CARDIOVERSION;  Surgeon: Elease Hashimoto Deloris Ping, MD;  Location: [facility] ENDOSCOPY;  Service: Cardiovascular;  Laterality: N/A;   ELBOW LIGAMENT RECONSTRUCTION Right 09/29/2014   Procedure: Tomma Lightning FRACTURE FIXATION, POSSIBLE LIGAMENT REPAIR;  Surgeon: Mable Paris, MD;  Location: [facility];  Service: Orthopedics;  Laterality: Right;  Right elbow coranoid fracture fixation, possible ligament repair   TEE WITHOUT CARDIOVERSION N/A  01/18/2018   Procedure: TRANSESOPHAGEAL ECHOCARDIOGRAM (TEE);  Surgeon: Laurey Morale, MD;  Location: [facility] ENDOSCOPY;  Service: Cardiovascular;  Laterality: N/A;   TEE WITHOUT CARDIOVERSION N/A 04/18/2021   Procedure: TRANSESOPHAGEAL ECHOCARDIOGRAM (TEE);  Surgeon: Elease Hashimoto Deloris Ping, MD;  Location: [facility] ENDOSCOPY;  Service: Cardiovascular;  Laterality: N/A;   WISDOM TOOTH EXTRACTION      Allergies  No Known Allergies  History of Present Illness    Samuel Flynn comes today for preoperative cardiac evaluation in order to have endoscopy through [facility], plan date of 03/12/2023, with recommendations concerning rivaroxaban.  Samuel Flynn has lost 157 pounds after having had gastric bypass surgery.  As result he is going to have an EGD for ongoing evaluation through GI.  He denies any new symptoms.  He no longer has OSA, type 2 diabetes or hypertension.  He remains active and feels very well with his weight loss having more energy and stamina.  He denies any bleeding on Xarelto.  He denies any palpitations or rapid heart rhythm.   Home Medications    Current Outpatient Medications  Medication Sig Dispense Refill   acetaminophen (TYLENOL) 325 MG tablet Take 2 tablets (650 mg total) by mouth every 4 (four) hours as needed for headache or mild pain.     allopurinol (ZYLOPRIM) 100 MG tablet Take 1 tablet (100 mg total) by mouth daily. ADDITIONAL REFILLS FROM PRIMARY CARE 90 tablet 0   ammonium lactate (LAC-HYDRIN) 12 % lotion APPLY  LOTION TOPICALLY TO AFFECTED AREA AS NEEDED FOR  DRY  SKIN 400 g 0   colchicine 0.6 MG tablet TAKE 1 TABLET  BY MOUTH ONCE DAILY AS NEEDED FOR GOUT 90 tablet 0   dofetilide (TIKOSYN) 500 MCG capsule Take 1 capsule (500 mcg total) by mouth 2 (two) times daily. 60 capsule 0   flecainide (TAMBOCOR) 100 MG tablet Take by mouth.     furosemide (LASIX) 40 MG tablet Take 1 tablet (40 mg total) by mouth 2 (two) times daily. Please keep scheduled appointment for  future refills. Thank you. 60 tablet 0   metFORMIN (GLUCOPHAGE-XR) 500 MG 24 hr tablet Take 1 tablet by mouth once daily with breakfast 90 tablet 0   metoCLOPramide (REGLAN) 10 MG tablet Take by mouth.     pantoprazole (PROTONIX) 40 MG tablet Take 1 tablet by mouth once daily 90 tablet 0   potassium chloride (KLOR-CON) 20 MEQ packet Take by mouth.     potassium chloride SA (KLOR-CON M) 20 MEQ tablet Take 2 tablets (40 mEq total) by mouth 2 (two) times daily. Please contact the office and schedule appointment for additional refills. 1st Attempt. 120 tablet 0   traZODone (DESYREL) 100 MG tablet Take 1 tablet (100 mg total) by mouth at bedtime. 90 tablet 1   XARELTO 20 MG TABS tablet TAKE 1 TABLET BY MOUTH ONCE DAILY WITH SUPPER 90 tablet 1   No current facility-administered medications for this visit.     Family History    Family History  Problem Relation Age of Onset   Hypertension Paternal Uncle    Hypertension Maternal Grandmother    Hypertension Paternal Uncle    Hypertension Paternal Uncle    Hypertension Paternal Uncle    Hypertension Paternal Uncle    Hypertension Mother    Stroke Father    Heart disease Father    Heart attack Father    Hypertension Father    Colon cancer Neg Hx    He indicated that his mother is alive. He indicated that his father is alive. He indicated that his maternal grandmother is deceased. He indicated that his maternal grandfather is deceased. He indicated that his paternal grandmother is deceased. He indicated that his paternal grandfather is deceased. He indicated that all of his five paternal uncles are alive. He indicated that the status of his neg hx is unknown.  Social History    Social History   Socioeconomic History   Marital status: Married    Spouse name: Not on file   Number of children: 4   Years of education: 10   Highest education level: Not on file  Occupational History   Occupation: Truck Air traffic controller: [employer]   Tobacco Use   Smoking status: Former    Types: Cigars    Quit date: 07/20/2021    Years since quitting: 1.5   Smokeless tobacco: Never   Tobacco comments:    Former smoker 08/16/21  Vaping Use   Vaping Use: Never used  Substance and Sexual Activity   Alcohol use: Not Currently    Comment: occ   Drug use: No   Sexual activity: Yes    Comment: 5 a day  Other Topics Concern   Not on file  Social History Narrative   Not on file   Social Determinants of Health   Financial Resource Strain: High Risk (06/28/2021)   Overall Financial Resource Strain (CARDIA)    Difficulty of Paying Living Expenses: Hard  Food Insecurity: No Food Insecurity (06/28/2021)   Hunger Vital Sign    Worried About Running Out of Food in the Last Year: Never  true    Ran Out of Food in the Last Year: Never true  Transportation Needs: No Transportation Needs (06/28/2021)   PRAPARE - Administrator, Civil Service (Medical): No    Lack of Transportation (Non-Medical): No  Physical Activity: Not on file  Stress: Not on file  Social Connections: Not on file  Intimate Partner Violence: Not on file     Review of Systems    General:  No chills, fever, night sweats or weight changes.  Cardiovascular:  No chest pain, dyspnea on exertion, edema, orthopnea, palpitations, paroxysmal nocturnal dyspnea. Dermatological: No rash, lesions/masses Respiratory: No cough, dyspnea Urologic: No hematuria, dysuria Abdominal:   No nausea, vomiting, diarrhea, bright red blood per rectum, melena, or hematemesis Neurologic:  No visual changes, wkns, changes in mental status. All other systems reviewed and are otherwise negative except as noted above.     Physical Exam    VS:  BP 128/86   Ht 6\' 3"  (1.905 m)   Wt 198 lb (89.8 kg)   SpO2 97%   BMI 24.75 kg/m  , BMI Body mass index is 24.75 kg/m.     GEN: Well nourished, well developed, in no acute distress. HEENT: normal. Neck: Supple, no JVD, carotid  bruits, or masses. Cardiac: RRR, no murmurs, rubs, or gallops. No clubbing, cyanosis, edema.  Radials/DP/PT 2+ and equal bilaterally.  Respiratory:  Respirations regular and unlabored, clear to auscultation bilaterally. GI: Soft, nontender, nondistended, BS + x 4. MS: no deformity or atrophy. Skin: warm and dry, no rash. Neuro:  Strength and sensation are intact. Psych: Normal affect.  Accessory Clinical Findings    ECG personally reviewed by me today-normal sinus rhythm, heart rate 71 bpm with nonspecific T wave abnormality.  QRS 78 ms, QTc 434 ms.  Lab Results  Component Value Date   WBC 7.5 06/22/2021   HGB 16.0 06/22/2021   HCT 48.3 06/22/2021   MCV 81.2 06/22/2021   PLT 312 06/22/2021   Lab Results  Component Value Date   CREATININE 1.11 08/24/2021   BUN 12 08/24/2021   NA 139 08/24/2021   K 4.1 08/24/2021   CL 102 08/24/2021   CO2 25 08/24/2021   Lab Results  Component Value Date   ALT 22 04/15/2021   AST 22 04/15/2021   ALKPHOS 76 04/15/2021   BILITOT 1.0 04/15/2021   Lab Results  Component Value Date   CHOL 203 (H) 04/16/2021   HDL 24 (L) 04/16/2021   LDLCALC UNABLE TO CALCULATE IF TRIGLYCERIDE OVER 400 mg/dL 16/06/9603   LDLDIRECT 107.0 (H) 04/16/2021   TRIG 428 (H) 04/16/2021   CHOLHDL 8.5 04/16/2021    Lab Results  Component Value Date   HGBA1C 5.2 09/15/2022    Review of Prior Studies: Echocardiogram 04/18/2021 1. Left ventricular ejection fraction, by estimation, is 60 to 65%. The  left ventricle has normal function.   2. Right ventricular systolic function is normal. The right ventricular  size is normal.   3. No left atrial/left atrial appendage thrombus was detected.   4. The mitral valve is normal in structure. Mild mitral valve  regurgitation.   5. The aortic valve is grossly normal. Aortic valve regurgitation is  trivial.        Assessment & Plan   1.  Preoperative preoperative cardiac evaluation:  Chart reviewed as part of  pre-operative protocol coverage. Given past medical history and time since last visit, based on ACC/AHA guidelines, Samuel Flynn would be at acceptable  risk for the planned procedure without further cardiovascular testing.  Per pharmacist patient is to hold Xarelto 48 hours prior to procedure.  Date of holding Xarelto 03/10/2023, resume again as soon as possible when hemodynamically stable postprocedure.  2.  Atrial fibrillation/flutter: Patient remains in normal sinus rhythm per EKG.  I will have him follow-up with EP as they are managing his antiarrhythmics.  He will continue Xarelto as directed with the exception of preoperative cessation as outlined above.  He will continue Tikosyn, flecainide, no longer on metoprolol.  3.  Hypertension: Currently well-controlled.  Not on any antihypertensives at this time.         Signed, Bettey Mare. Liborio Nixon, ANP, AACC   02/20/2023 5:10 PM      Office [phone] Fax [phone]  Notice: This dictation was prepared with Dragon dictation along with smaller phrase technology. Any transcriptional errors that result from this process are unintentional and may not be corrected upon review.          Signed, Bettey Mare. Liborio Nixon, ANP, AACC   02/20/2023 5:09 PM      Office [phone] Fax [phone]  Notice: This dictation was prepared with Dragon dictation along with smaller phrase technology. Any transcriptional errors that result from this process are unintentional and may not be corrected upon review.

## 2023-02-13 NOTE — Telephone Encounter (Signed)
Wife scheduled patient in-office visit on 6/18.

## 2023-02-20 ENCOUNTER — Encounter: Payer: Self-pay | Admitting: Adult Health

## 2023-02-20 ENCOUNTER — Ambulatory Visit: Payer: Medicare Other | Attending: Adult Health | Admitting: Adult Health

## 2023-02-20 VITALS — BP 128/86 | Ht 75.0 in | Wt 198.0 lb

## 2023-02-20 DIAGNOSIS — I10 Essential (primary) hypertension: Secondary | ICD-10-CM | POA: Insufficient documentation

## 2023-02-20 DIAGNOSIS — I480 Paroxysmal atrial fibrillation: Secondary | ICD-10-CM | POA: Insufficient documentation

## 2023-02-20 DIAGNOSIS — Z01818 Encounter for other preprocedural examination: Secondary | ICD-10-CM | POA: Insufficient documentation

## 2023-02-20 NOTE — Patient Instructions (Signed)
Medication Instructions:  No Changes *If you need a refill on your cardiac medications before your next appointment, please call your pharmacy*   Lab Work: No Labs If you have labs (blood work) drawn today and your tests are completely normal, you will receive your results only by: MyChart Message (if you have MyChart) OR A paper copy in the mail If you have any lab test that is abnormal or we need to change your treatment, we will call you to review the results.   Testing/Procedures: No Testing   Follow-Up: At [facility], you and your health needs are our priority.  As part of our continuing mission to provide you with exceptional heart care, we have created designated Provider Care Teams.  These Care Teams include your primary Cardiologist (physician) and Advanced Practice Providers (APPs -  Physician Assistants and Nurse Practitioners) who all work together to provide you with the care you need, when you need it.  We recommend signing up for the patient portal called "MyChart".  Sign up information is provided on this After Visit Summary.  MyChart is used to connect with patients for Virtual Visits (Telemedicine).  Patients are able to view lab/test results, encounter notes, upcoming appointments, etc.  Non-urgent messages can be sent to your provider as well.   To learn more about what you can do with MyChart, go to [email or web address].    Your next appointment:   6 month(s)  Provider:   Chilton Si, MD (6 months). Will Elberta Fortis, MD    ( First Available)    Other Instructions Hold Xarelto starting 7/6 . Can resume as directed by surgeon.

## 2023-02-27 ENCOUNTER — Other Ambulatory Visit: Payer: Self-pay | Admitting: Family Medicine

## 2023-02-27 ENCOUNTER — Other Ambulatory Visit: Payer: Self-pay | Admitting: Cardiology

## 2023-02-28 NOTE — Telephone Encounter (Signed)
Requested Prescriptions  Pending Prescriptions Disp Refills   ammonium lactate (LAC-HYDRIN) 12 % lotion [Pharmacy Med Name: Ammonium Lactate 12 % External Lotion] 400 g 0    Sig: APPLY  LOTION TOPICALLY TO AFFECTED AREA AS NEEDED FOR DRY SKIN     Dermatology:  Other Passed - 02/27/2023  6:34 PM      Passed - Valid encounter within last 12 months    Recent Outpatient Visits           5 months ago Type 2 diabetes mellitus without complication, without long-term current use of insulin (HCC)   [facility], Lauris Poag, MD   10 months ago Type 2 diabetes mellitus without complication, without long-term current use of insulin (HCC)   [facility], Lauris Poag, MD   11 months ago Type 2 diabetes mellitus without complication, without long-term current use of insulin (HCC)   [facility], Lauris Poag, MD   1 year ago Type 2 diabetes mellitus without complication, without long-term current use of insulin [facility])   [place], MD   1 year ago Hospital discharge follow-up   [facility], [place], NP       Future Appointments             In 2 weeks Georganna Skeans, MD [facility]   In 3 months Camnitz, Andree Coss, MD [facility]

## 2023-03-15 ENCOUNTER — Ambulatory Visit: Payer: Medicaid Other | Admitting: Family Medicine

## 2023-03-29 ENCOUNTER — Telehealth: Payer: Self-pay | Admitting: Cardiology

## 2023-03-29 NOTE — Telephone Encounter (Signed)
Called and spoke to pt and advised him to update DPR at his next OV, nothing on file that clears up to speak with wife.  Discussed DOT paperwork needing completion.  Pt aware he will need to be seen before Camnitz could address since we have not seen him since January 2023. Aware I will have scheduler call to arrange sooner OV.  Called wife and explained what happened.  Also gave her fax number to send over DOT request. She appreciates the call/help.

## 2023-03-29 NOTE — Telephone Encounter (Signed)
Patient's wife is calling to discuss patients DOT form. Requesting call back.

## 2023-03-30 ENCOUNTER — Other Ambulatory Visit: Payer: Self-pay | Admitting: Family Medicine

## 2023-04-29 ENCOUNTER — Other Ambulatory Visit: Payer: Self-pay | Admitting: Family Medicine

## 2023-05-02 NOTE — Progress Notes (Signed)
Cardiology Office Note:  .   Date:  05/04/2023  ID:  Samuel Flynn, DOB [birth year], MRN [id number] PCP: Georganna Skeans, MD  [facility] Providers Cardiologist:  Chilton Si, MD Electrophysiologist:  Will Jorja Loa, MD {   History of Present Illness: Samuel Flynn is a 47 y.o. male w/PMHx of HTN, OSA w/CPAP, AFib   He saw Dr. Elberta Fortis 09/20/21, doing well, on episode of AFib since starting dofetilide, noted some higher then usual BPs of late, started on Toprol  A couple telephone visits via the pre-op team   Needs DOT paperwork/note  TODAY ROS: He has gastric bypass surgery last October, has lost >100 pounds and really feels great since he has lost the weight. Reports good energy, very physically active job, gets in the yard, no difficulties with ADLs. No CP, palpitations or cardiac awareness No dizzy spells, near syncope or syncope. No SOB, DOE  AFib/AAD hx PVI ablation 04/26/2018 PVI ablation 11/07/2019 AFlutter noted in 2022,  1:1 suspect 2/2 flecainide > stopped Tikosyn started Nov 2022   Studies Reviewed: Marland Kitchen     EKG done today and reviewed by myself SB 47bpm, QTc 447  04/18/21: TEE 1. Left ventricular ejection fraction, by estimation, is 60 to 65%. The  left ventricle has normal function.   2. Right ventricular systolic function is normal. The right ventricular  size is normal.   3. No left atrial/left atrial appendage thrombus was detected.   4. The mitral valve is normal in structure. Mild mitral valve  regurgitation.   5. The aortic valve is grossly normal. Aortic valve regurgitation is  trivial.   11/07/19: EPS/ablation CONCLUSIONS: 1. Sinus rhythm upon presentation.   2. Successful electrical isolation and anatomical encircling of all four pulmonary veins with radiofrequency current. 3. No inducible arrhythmias following ablation both on and off of Isuprel 4. No early apparent complications.  04/26/2018:  EPS/ablation CONCLUSIONS: 1. Sinus rhythm upon presentation.   2. Successful electrical isolation and anatomical encircling of all four pulmonary veins with radiofrequency current. 3. No inducible arrhythmias following ablation both on and off of Isuprel 4. No early apparent complications.  Risk Assessment/Calculations:        Physical Exam:   VS:  BP (!) 142/78 (BP Location: Right Arm, Patient Position: Sitting, Cuff Size: Normal)   Pulse (!) 47   Ht 6\' 2"  (1.88 m)   Wt 185 lb 12.8 oz (84.3 kg)   SpO2 98%   BMI 23.86 kg/m    Wt Readings from Last 3 Encounters:  05/04/23 185 lb 12.8 oz (84.3 kg)  02/20/23 198 lb (89.8 kg)  09/15/22 232 lb (105.2 kg)    GEN: Well nourished, well developed in no acute distress NECK: No JVD; No carotid bruits CARDIAC: RRR, bradycardic, no murmurs, rubs, gallops RESPIRATORY:  CTA b/l, wheezing or rhonchi  ABDOMEN: Soft, non-tender, non-distended EXTREMITIES: No edema; No deformity   ASSESSMENT AND PLAN: .    Paroxysmal Afib CHA2DS2Vasc is one, on Xarelto, appropriately dosed Tikosyn w/stable QTc Low/no burden by symptoms Labs today Tikosyn teaching re-enforced Importance of follow up discussed  HTN Slightly elevated Asked him to check his BP periodically, follow up with PMD if persistently elevated  Secondary hypercoagulable state  Asymptomatic bradycardia On no nodal blocking agents No symptoms Will have him wear a 3 day monitor to evaluate HR excursion/rates   Unable to find any DOT paperwork Note written for him No cardiac contraindications to driving    Dispo:  back in 53mo, sooner if needed  Signed, Sheilah Pigeon, PA-C

## 2023-05-04 ENCOUNTER — Ambulatory Visit: Payer: Medicare Other | Attending: Physician Assistant

## 2023-05-04 ENCOUNTER — Ambulatory Visit: Payer: Medicare Other | Admitting: Physician Assistant

## 2023-05-04 ENCOUNTER — Encounter: Payer: Self-pay | Admitting: Physician Assistant

## 2023-05-04 VITALS — BP 142/78 | HR 47 | Ht 74.0 in | Wt 185.8 lb

## 2023-05-04 DIAGNOSIS — I10 Essential (primary) hypertension: Secondary | ICD-10-CM

## 2023-05-04 DIAGNOSIS — D6869 Other thrombophilia: Secondary | ICD-10-CM | POA: Diagnosis present

## 2023-05-04 DIAGNOSIS — Z79899 Other long term (current) drug therapy: Secondary | ICD-10-CM

## 2023-05-04 DIAGNOSIS — R001 Bradycardia, unspecified: Secondary | ICD-10-CM

## 2023-05-04 DIAGNOSIS — I480 Paroxysmal atrial fibrillation: Secondary | ICD-10-CM | POA: Diagnosis present

## 2023-05-04 DIAGNOSIS — Z5181 Encounter for therapeutic drug level monitoring: Secondary | ICD-10-CM | POA: Diagnosis present

## 2023-05-04 NOTE — Patient Instructions (Addendum)
Medication Instructions:   Your physician recommends that you continue on your current medications as directed. Please refer to the Current Medication list given to you today.   *If you need a refill on your cardiac medications before your next appointment, please call your pharmacy*   Lab Work: BMET MAG AND CBC TODAY    If you have labs (blood work) drawn today and your tests are completely normal, you will receive your results only by: MyChart Message (if you have MyChart) OR A paper copy in the mail If you have any lab test that is abnormal or we need to change your treatment, we will call you to review the results.   Testing/Procedures: Your physician has recommended that you wear an event monitor. Event monitors are medical devices that record the heart's electrical activity. Doctors most often [place] these monitors to diagnose arrhythmias. Arrhythmias are problems with the speed or rhythm of the heartbeat. The monitor is a small, portable device. You can wear one while you do your normal daily activities. This is usually used to diagnose what is causing palpitations/syncope (passing out).      Follow-Up: At [facility], you and your health needs are our priority.  As part of our continuing mission to provide you with exceptional heart care, we have created designated Provider Care Teams.  These Care Teams include your primary Cardiologist (physician) and Advanced Practice Providers (APPs -  Physician Assistants and Nurse Practitioners) who all work together to provide you with the care you need, when you need it.  We recommend signing up for the patient portal called "MyChart".  Sign up information is provided on this After Visit Summary.  MyChart is used to connect with patients for Virtual Visits (Telemedicine).  Patients are able to view lab/test results, encounter notes, upcoming appointments, etc.  Non-urgent messages can be sent to your provider as well.   To learn more  about what you can do with MyChart, go to [email or web address].    Your next appointment:   6 month(s)  Provider:   You may see Will Jorja Loa, MD  or one of the following Advanced Practice Providers on your designated Care Team:   Francis Dowse, [place]  Other Instructions  ZIO XT- Long Term Monitor Instructions  Your physician has requested you wear a ZIO patch monitor for 3 days.  This is a single patch monitor. [employer] supplies one patch monitor per enrollment. Additional stickers are not available. Please do not apply patch if you will be having a Nuclear Stress Test,  Echocardiogram, Cardiac CT, MRI, or Chest Xray during the period you would be wearing the  monitor. The patch cannot be worn during these tests. You cannot remove and re-apply the  ZIO XT patch monitor.  Your ZIO patch monitor will be mailed 3 day USPS to your address on file. It may take 3-5 days  to receive your monitor after you have been enrolled.  Once you have received your monitor, please review the enclosed instructions. Your monitor  has already been registered assigning a specific monitor serial # to you.  Billing and Patient Assistance Program Information  We have supplied [employer] with any of your insurance information on file for billing purposes. [employer] offers a sliding scale Patient Assistance Program for patients that do not have  insurance, or whose insurance does not completely cover the cost of the ZIO monitor.  You must apply for the Patient Assistance Program to qualify for this discounted rate.  To apply, please call [employer] at [phone], select option 4, select option 2, ask to apply for  Patient Assistance Program. Meredeth Ide will ask your household income, and how many people  are in your household. They will quote your out-of-pocket cost based on that information.  [employer] will also be able to set up a 11-month, interest-free payment plan if needed.  Applying the monitor   Shave  hair from upper left chest.  Hold abrader disc by orange tab. Rub abrader in 40 strokes over the upper left chest as  indicated in your monitor instructions.  Clean area with 4 enclosed alcohol pads. Let dry.  Apply patch as indicated in monitor instructions. Patch will be placed under collarbone on left  side of chest with arrow pointing upward.  Rub patch adhesive wings for 2 minutes. Remove white label marked "1". Remove the white  label marked "2". Rub patch adhesive wings for 2 additional minutes.  While looking in a mirror, press and release button in center of patch. A small green light will  flash 3-4 times. This will be your only indicator that the monitor has been turned on.  Do not shower for the first 24 hours. You may shower after the first 24 hours.  Press the button if you feel a symptom. You will hear a small click. Record Date, Time and  Symptom in the Patient Logbook.  When you are ready to remove the patch, follow instructions on the last 2 pages of Patient  Logbook. Stick patch monitor onto the last page of Patient Logbook.  Place Patient Logbook in the blue and white box. Use locking tab on box and tape box closed  securely. The blue and white box has prepaid postage on it. Please place it in the mailbox as  soon as possible. Your physician should have your test results approximately 7 days after the  monitor has been mailed back to [facility].  Call [facility] Customer Care at [phone] if you have questions regarding  your ZIO XT patch monitor. Call them immediately if you see an orange light blinking on your  monitor.  If your monitor falls off in less than 4 days, contact our Monitor department at [phone].  If your monitor becomes loose or falls off after 4 days call [employer] at [phone] for  suggestions on securing your monitor

## 2023-05-04 NOTE — Progress Notes (Unsigned)
Enrolled patient for a 3 day Zio XT monitor to be mailed to patients home   [place] to read

## 2023-05-05 LAB — CBC
Hematocrit: 45.9 % (ref 37.5–51.0)
Hemoglobin: 15.5 g/dL (ref 13.0–17.7)
MCH: 30.2 pg (ref 26.6–33.0)
MCHC: 33.8 g/dL (ref 31.5–35.7)
MCV: 90 fL (ref 79–97)
Platelets: 220 10*3/uL (ref 150–450)
RBC: 5.13 x10E6/uL (ref 4.14–5.80)
RDW: 15.2 % (ref 11.6–15.4)
WBC: 4.7 10*3/uL (ref 3.4–10.8)

## 2023-05-05 LAB — BASIC METABOLIC PANEL
BUN/Creatinine Ratio: 9 (ref 9–20)
BUN: 10 mg/dL (ref 6–24)
CO2: 26 mmol/L (ref 20–29)
Calcium: 8.8 mg/dL (ref 8.7–10.2)
Chloride: 103 mmol/L (ref 96–106)
Creatinine, Ser: 1.11 mg/dL (ref 0.76–1.27)
Glucose: 84 mg/dL (ref 70–99)
Potassium: 5.1 mmol/L (ref 3.5–5.2)
Sodium: 143 mmol/L (ref 134–144)
eGFR: 82 mL/min/{1.73_m2} (ref >59)

## 2023-05-05 LAB — MAGNESIUM: Magnesium: 2.1 mg/dL (ref 1.6–2.3)

## 2023-05-20 ENCOUNTER — Other Ambulatory Visit: Payer: Self-pay | Admitting: Family Medicine

## 2023-05-20 DIAGNOSIS — R001 Bradycardia, unspecified: Secondary | ICD-10-CM

## 2023-05-20 DIAGNOSIS — K219 Gastro-esophageal reflux disease without esophagitis: Secondary | ICD-10-CM

## 2023-05-31 ENCOUNTER — Telehealth (HOSPITAL_BASED_OUTPATIENT_CLINIC_OR_DEPARTMENT_OTHER): Payer: Self-pay | Admitting: Cardiovascular Disease

## 2023-05-31 NOTE — Telephone Encounter (Signed)
Caller Forensic scientist) is reporting abnormal results.

## 2023-05-31 NOTE — Telephone Encounter (Signed)
Transferred call from call center,   Ester calling Zio, abnormal report.   Experienced symptomatic bradycardia, 40bpm, 30 secs, occurred 9/16 5:33pm. Found on page 4, strip 4.   Report under media tab.

## 2023-06-04 ENCOUNTER — Telehealth: Payer: Self-pay

## 2023-06-04 ENCOUNTER — Other Ambulatory Visit: Payer: Self-pay | Admitting: Family Medicine

## 2023-06-04 ENCOUNTER — Telehealth: Payer: Self-pay | Admitting: *Deleted

## 2023-06-04 NOTE — Telephone Encounter (Signed)
Med reill

## 2023-06-04 NOTE — Transitions of Care (Post Inpatient/ED Visit) (Signed)
06/04/2023  Name: Samuel Flynn MRN: [id number] DOB: July 17, 1976  Today's TOC FU Call Status: Today's TOC FU Call Status:: Successful TOC FU Call Completed TOC FU Call Complete Date: 06/04/23 Patient's Name and Date of Birth confirmed.  Transition Care Management Follow-up Telephone Call Date of Discharge: 06/01/23 Discharge Facility: Other [facility]) Name of Other (Non-[facility]) Discharge Facility: [facility] Type of Discharge: Inpatient Admission Primary Inpatient Discharge Diagnosis:: syncope How have you been since you were released from the hospital?: Better Any questions or concerns?: No  Items Reviewed: Did you receive and understand the discharge instructions provided?: Yes Medications obtained,verified, and reconciled?: Yes (Medications Reviewed) Any new allergies since your discharge?: No Dietary orders reviewed?: Yes Do you have support at home?: Yes People in Home: spouse  Medications Reviewed Today: Medications Reviewed Today     Reviewed by Karena Addison, LPN (Licensed Practical Nurse) on 06/04/23 at 1057  Med List Status: <None>   Medication Order Taking? Sig Documenting Provider Last Dose Status Informant  acetaminophen (TYLENOL) 325 MG tablet [id number] Yes Take 2 tablets (650 mg total) by mouth every 4 (four) hours as needed for headache or mild pain. Cyndi Bender, NP Taking Active Self  allopurinol (ZYLOPRIM) 100 MG tablet [id number] Yes Take 1 tablet (100 mg total) by mouth daily. ADDITIONAL REFILLS FROM PRIMARY CARE Chilton Si, MD Taking Active   ammonium lactate (LAC-HYDRIN) 12 % lotion [id number] Yes APPLY LOTION TOPICALLY TO AFFECTED AREA AS NEEDED FOR DRY SKIN Georganna Skeans, MD Taking Active   colchicine 0.6 MG tablet [id number] Yes TAKE 1 TABLET BY MOUTH ONCE DAILY AS NEEDED FOR GOUT Georganna Skeans, MD Taking Active   dofetilide [facility]) 500 MCG capsule [id number] Yes Take 1 capsule by mouth twice daily Camn[place], Will Daphine Deutscher, MD  Taking Active   furosemide (LASIX) 40 MG tablet [id number] No Take 1 tablet (40 mg total) by mouth 2 (two) times daily.  Patient not taking: Reported on 06/04/2023   Regan Lemming, MD Not Taking Active   pantoprazole (PROTONIX) 40 MG tablet [id number] Yes Take 1 tablet by mouth once daily Georganna Skeans, MD Taking Active   XARELTO 20 MG TABS tablet [id number] Yes TAKE 1 TABLET BY MOUTH ONCE DAILY WITH SUPPER Chilton Si, MD Taking Active             Home Care and Equipment/Supplies: Were Home Health Services Ordered?: NA Any new equipment or medical supplies ordered?: NA  Functional Questionnaire: Do you need assistance with bathing/showering or dressing?: No Do you need assistance with meal preparation?: No Do you need assistance with eating?: No Do you have difficulty maintaining continence: No Do you need assistance with getting out of bed/getting out of a chair/moving?: No Do you have difficulty managing or taking your medications?: No  Follow up appointments reviewed: PCP Follow-up appointment confirmed?: No (no avail appts, sent message to staff to schedule) Specialist Hospital Follow-up appointment confirmed?: Yes Date of Specialist follow-up appointment?: 06/12/23 Follow-Up Specialty Provider:: cardio Do you need transportation to your follow-up appointment?: No Do you understand care options if your condition(s) worsen?: Yes-patient verbalized understanding    SIGNATURE Karena Addison, LPN [facility] Nurse Health Advisor Direct Dial [phone]

## 2023-06-05 NOTE — Telephone Encounter (Signed)
Requested Prescriptions  Pending Prescriptions Disp Refills   ammonium lactate (LAC-HYDRIN) 12 % lotion [Pharmacy Med Name: Ammonium Lactate 12 % External Lotion] 400 g 0    Sig: APPLY LOTION TOPICALLY TO AFFECTED AREA AS NEEDED FOR DRY SKIN     Dermatology:  Other Passed - 06/04/2023  9:07 AM      Passed - Valid encounter within last 12 months    Recent Outpatient Visits           8 months ago Type 2 diabetes mellitus without complication, without long-term current use of insulin (HCC)   [place] [facility], Lauris Poag, MD   1 year ago Type 2 diabetes mellitus without complication, without long-term current use of insulin (HCC)   [place] [facility], Lauris Poag, MD   1 year ago Type 2 diabetes mellitus without complication, without long-term current use of insulin (HCC)   [place] [facility], Lauris Poag, MD   1 year ago Type 2 diabetes mellitus without complication, without long-term current use of insulin [facility])   [place] [facility], MD   1 year ago Hospital discharge follow-up   [facility], Salomon Fick, NP       Future Appointments             In 1 week Alben Spittle, Evern Bio, PA-C [facility], [facility]

## 2023-06-11 ENCOUNTER — Ambulatory Visit: Payer: Medicare Other | Admitting: Cardiology

## 2023-06-11 DIAGNOSIS — R55 Syncope and collapse: Secondary | ICD-10-CM | POA: Insufficient documentation

## 2023-06-11 DIAGNOSIS — R001 Bradycardia, unspecified: Secondary | ICD-10-CM | POA: Insufficient documentation

## 2023-06-11 NOTE — Progress Notes (Unsigned)
Cardiology Office Note:  .   Date:  06/12/2023  ID:  Samuel Flynn, DOB Apr 30, 1976, MRN [id number] PCP: Georganna Skeans, MD  [facility] Providers Cardiologist:  Chilton Si, MD Electrophysiologist:  Will Jorja Loa, MD {   History of Present Illness: Samuel Flynn is a 47 y.o. male w/PMHx of HTN, OSA w/CPAP, AFib   He saw Dr. 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 Reports good energy, very physically active job, gets in the yard, no difficulties with ADLs. No CP, palpitations or cardiac awareness No dizzy spells, near syncope or syncope. No SOB, DOE Planned to wear a monitor to assess HR No changes were made, not felt to have any driving restrictions  Admitted to [facility] after a syncopal event 05/29/23 (no longer on the monitor), evaluated by cardiology/EP service  Underwent an echocardiogram with LVEF 55 to 60% with normal wall motion and normal diastolic function and trace heart multi valvular regurgitation present. Orthostatics that were collected at admission were negative. Patient was noted to be consistently with his heart rates in the 30s to 40s with episodic rises due to chronotropic competency to the 60s and low 70s. Cardiology was very instrumental in the patient's care during this hospitalization. They have since recommended that the patient follow-up with his primary electrophysiologist cardiologist at [facility] health. Stated that there was no clear indication for pacemaker insertion during this hospitalization. It was thought that his vasovagal symptoms could have been secondary to his rapid weight loss with his prior gastric sleeve  performed in 2023. Per cardiology note "When his heart rate is in high 30s and 40s, he is completely asymptomatic. There is no evidence of chronotropic incompetence. Upon walking, his heart rate increased to 90 bpm with no exertional symptoms. There is no clear indication for pacemaker insertion  Cardiology has recommended reducing his Tikosyn from 500 mg twice daily to 250 mg twice daily with continue Xarelto use for anticoagulation  Discharged 06/01/23  06/01/23 BUN/Creat 11/1.11 K+ 4.4 Mag 1.8  05/30/23 H/H 12/36  TODAY He is accompanied by his wife. He reports about 2-3 mo of spells, feels weak. Starts by feeling hot/sweaty, then feels weak, episodes can last 10 minutes He had not mentioned them before given they weren't "all that bad" but the day he went tot he hospital was a strong episode. He is firm and clear, he has never fainted, and not really felt like he was going to, but weak. His wife reports he looks pale, and afterwards specifically feels really wiped out. His HR is funky afterwards, with double beats, perhaps skipped beats. He reports during his stay they gave him none of his meds and he felt better, since home has taken none and really feel better, no recurrent episodes His wife sees a clear improvement in him as well.  Denies CP, SOB He feels like he would certainly know if he has Af and has not.     AFib/AAD hx PVI ablation 04/26/2018 PVI ablation 11/07/2019 AFlutter noted in 2022,  1:1 suspect 2/2 flecainide > stopped Tikosyn started Nov 2022   Studies Reviewed: Marland Kitchen     EKG done today  and reviewed by myself SB 50bpm, QTc  05/30/23: TTE ([facility]) Left Ventricle: Left ventricle size is normal.    Left Ventricle: Systolic function is normal. EF: 55-60%. Quantitative  analysis of left ventricular Global Longitudinal Strain (GLS) imaging is  normal at -18.700%. Ejection fraction measured by 3D is normal 64%,    Right Ventricle: Right ventricle size is  normal.    Right Ventricle: Systolic function is normal.   Sept 2024: monitor Patient had a min HR of 35 bpm, max HR of 190 bpm, and avg HR of 58 bpm.  Predominant underlying rhythm was Sinus Rhythm.  7 Supraventricular Tachycardia runs occurred, fastest interval lasting 6 beats with a max rate of 190 bpm, longest 16 beats with an avg rate of 129 bpm.  Some episodes of Supraventricular Tachycardia conducted with possible aberrancy. Some episodes of Supraventricular Tachycardia may be possible  4.1% supraventricular ectopy Less than 1% ventricular ectopy Patient triggered episodes associated with sinus rhythm, PVCs, PACs, and AIVR   04/18/21: TEE 1. Left ventricular ejection fraction, by estimation, is 60 to 65%. The  left ventricle has normal function.   2. Right ventricular systolic function is normal. The right ventricular  size is normal.   3. No left atrial/left atrial appendage thrombus was detected.   4. The mitral valve is normal in structure. Mild mitral valve  regurgitation.   5. The aortic valve is grossly normal. Aortic valve regurgitation is  trivial.   11/07/19: EPS/ablation CONCLUSIONS: 1. Sinus rhythm upon presentation.   2. Successful electrical isolation and anatomical encircling of all four pulmonary veins with radiofrequency current. 3. No inducible arrhythmias following ablation both on and off of Isuprel 4. No early apparent complications.  04/26/2018: EPS/ablation CONCLUSIONS: 1. Sinus rhythm upon presentation.   2. Successful electrical isolation and anatomical encircling of all four pulmonary veins with radiofrequency current. 3. No inducible arrhythmias following ablation both on and off of Isuprel 4. No early apparent complications.  Risk Assessment/Calculations:        Physical Exam:   VS:  There were no vitals taken for this visit.   Wt Readings from Last 3 Encounters:  05/04/23 185 lb 12.8 oz (84.3 kg)  02/20/23 198 lb (89.8 kg)  09/15/22 232 lb  (105.2 kg)    GEN: Well nourished, well developed in no acute distress NECK: No JVD; No carotid bruits CARDIAC: RRR, bradycardic, no murmurs, rubs, gallops RESPIRATORY:  CTA b/l, wheezing or rhonchi  ABDOMEN: Soft, non-tender, non-distended EXTREMITIES: No edema; No deformity   ASSESSMENT AND PLAN: .    Paroxysmal Afib CHA2DS2Vasc is one  He would prefer not to be on meds, especially the tikosyn at least. Will stop it, and he is instructed clearly that he is not to resume, would require re-hospitalization to get started back should he have AFib. Though if so, he says he would prefer a different medication if needed anyway  We discussed his risk score one, stroke risk with Afib and risks/benefits of OAC For now in shared decision, will stay off OAC  He feels like his symptoms are clear and reliable when he has afib Will follow closely   HTN No meds, looks good  Secondary hypercoagulable state CHA2DS2Vasc is one  Asymptomatic bradycardia On no nodal blocking agents   5. New syncope He firmly denies syncope None since off meds       Dispo: back in 28mo, sooner if needed  Signed, Sheilah Pigeon, PA-C

## 2023-06-12 ENCOUNTER — Encounter: Payer: Self-pay | Admitting: Physician Assistant

## 2023-06-12 ENCOUNTER — Ambulatory Visit: Payer: Medicare Other | Attending: Physician Assistant | Admitting: Physician Assistant

## 2023-06-12 VITALS — BP 128/66 | HR 54 | Ht 74.0 in | Wt 193.4 lb

## 2023-06-12 DIAGNOSIS — I5032 Chronic diastolic (congestive) heart failure: Secondary | ICD-10-CM

## 2023-06-12 DIAGNOSIS — R001 Bradycardia, unspecified: Secondary | ICD-10-CM | POA: Diagnosis not present

## 2023-06-12 DIAGNOSIS — I10 Essential (primary) hypertension: Secondary | ICD-10-CM

## 2023-06-12 DIAGNOSIS — I480 Paroxysmal atrial fibrillation: Secondary | ICD-10-CM

## 2023-06-12 DIAGNOSIS — R55 Syncope and collapse: Secondary | ICD-10-CM

## 2023-06-12 MED ORDER — FUROSEMIDE 40 MG PO TABS: 40.0000 mg | ORAL_TABLET | Freq: Every day | ORAL | 0 refills | Status: DC | PRN

## 2023-06-12 NOTE — Patient Instructions (Addendum)
Medication Instructions:   START TAKING: FUROSEMIDE 40 MG ONCE A DAY AS NEEDED FOR  EDEMA   STOP TAKING AND REMOVE THIS MEDICATION FROM YOUR MEDICATION LIST: Joice Lofts AND XARELTO    *If you need a refill on your cardiac medications before your next appointment, please call your pharmacy*   Lab Work: NONE ORDERED  TODAY    If you have labs (blood work) drawn today and your tests are completely normal, you will receive your results only by: MyChart Message (if you have MyChart) OR A paper copy in the mail If you have any lab test that is abnormal or we need to change your treatment, we will call you to review the results.   Testing/Procedures: NONE ORDERED  TODAY     Follow-Up: At [facility], you and your health needs are our priority.  As part of our continuing mission to provide you with exceptional heart care, we have created designated Provider Care Teams.  These Care Teams include your primary Cardiologist (physician) and Advanced Practice Providers (APPs -  Physician Assistants and Nurse Practitioners) who all work together to provide you with the care you need, when you need it.  We recommend signing up for the patient portal called "MyChart".  Sign up information is provided on this After Visit Summary.  MyChart is used to connect with patients for Virtual Visits (Telemedicine).  Patients are able to view lab/test results, encounter notes, upcoming appointments, etc.  Non-urgent messages can be sent to your provider as well.   To learn more about what you can do with MyChart, go to [email or web address].    Your next appointment:   1 month(s) ( CONTACT  CASSIE HALL/ ANGELINE HAMMER FOR EP SCHEDULING ISSUES )   Provider:   You may see Will Jorja Loa, MD or one of the following Advanced Practice Providers on your designated Care Team:   Francis Dowse, [place]   Other Instructions

## 2023-07-01 ENCOUNTER — Other Ambulatory Visit: Payer: Self-pay | Admitting: Cardiology

## 2023-07-12 NOTE — Progress Notes (Deleted)
Cardiology Office Note:  .   Date:  07/12/2023  ID:  Samuel Flynn, DOB [birth year], MRN [id number] PCP: Georganna Skeans, MD  [facility] Providers Cardiologist:  Chilton Si, MD Electrophysiologist:  Will Jorja Loa, MD {   History of Present Illness: Samuel Flynn is a 47 y.o. male w/PMHx of HTN, OSA w/CPAP, AFib   He saw Dr. 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 Reports good energy, very physically active job, gets in the yard, no difficulties with ADLs. No CP, palpitations or cardiac awareness No dizzy spells, near syncope or syncope. No SOB, DOE Planned to wear a monitor to assess HR No changes were made, not felt to have any driving restrictions  Admitted to [facility] after a syncopal event 05/29/23 (no longer on the monitor), evaluated by cardiology/EP service  Underwent an echocardiogram with LVEF 55 to 60% with normal wall motion and normal diastolic function and trace heart multi valvular regurgitation present. Orthostatics that were collected at admission were negative. Patient was noted to be consistently with his heart rates in the 30s to 40s with episodic rises due to chronotropic competency to the 60s and low 70s. Cardiology was very instrumental in the patient's care during this hospitalization. They have since recommended that the patient follow-up with his primary electrophysiologist cardiologist at [facility] health. Stated that there was no clear indication for pacemaker insertion during this hospitalization. It was thought that his vasovagal symptoms could have been secondary to his rapid weight loss with his prior gastric sleeve  performed in 2023. Per cardiology note "When his heart rate is in high 30s and 40s, he is completely asymptomatic. There is no evidence of chronotropic incompetence. Upon walking, his heart rate increased to 90 bpm with no exertional symptoms. There is no clear indication for pacemaker insertion  Cardiology has recommended reducing his Tikosyn from 500 mg twice daily to 250 mg twice daily with continue Xarelto use for anticoagulation  Discharged 06/01/23  06/01/23 BUN/Creat 11/1.11 K+ 4.4 Mag 1.8  05/30/23 H/H 12/36  I saw him 06/12/23 He is accompanied by his wife. He reports about 2-3 mo of spells, feels weak. Starts by feeling hot/sweaty, then feels weak, episodes can last 10 minutes He had not mentioned them before given they weren't "all that bad" but the day he went tot he hospital was a strong episode. He is firm and clear, he has never fainted, and not really felt like he was going to, but weak. His wife reports he looks pale, and afterwards specifically feels really wiped out. His HR is funky afterwards, with double beats, perhaps skipped beats. He reports during his stay they gave him none of his meds and he felt better, since home has taken none and really feel better, no recurrent episodes His wife sees a clear improvement in him as well. Denies CP, SOB He feels like he would certainly know if he has Af and has not. 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    Left Ventricle: Systolic function is normal. EF: 55-60%. Quantitative  analysis of left ventricular Global Longitudinal Strain (GLS) imaging is  normal at  -18.700%. Ejection fraction measured by 3D is normal 64%,    Right Ventricle: Right ventricle size is normal.    Right Ventricle: Systolic function is normal.   Sept 2024: monitor Patient had a min HR of 35 bpm, max HR of 190 bpm, and avg HR of 58 bpm.  Predominant underlying rhythm was Sinus Rhythm.  7 Supraventricular Tachycardia runs occurred, fastest interval lasting 6 beats with a max rate of 190 bpm, longest 16 beats with an avg rate of 129 bpm.  Some episodes of Supraventricular Tachycardia conducted with possible aberrancy. Some episodes of Supraventricular Tachycardia may be possible  4.1% supraventricular ectopy Less than 1% ventricular ectopy Patient triggered episodes associated with sinus rhythm, PVCs, PACs, and AIVR   04/18/21: TEE 1. Left ventricular ejection fraction, by estimation, is 60 to 65%. The  left ventricle has normal function.   2. Right ventricular systolic function is normal. The right ventricular  size is normal.   3. No left atrial/left atrial appendage thrombus was detected.   4. The mitral valve is normal in structure. Mild mitral valve  regurgitation.   5. The aortic valve is grossly normal. Aortic valve regurgitation is  trivial.   11/07/19: EPS/ablation CONCLUSIONS: 1. Sinus rhythm upon presentation.   2. Successful electrical isolation and anatomical encircling of all four pulmonary veins with radiofrequency current. 3. No inducible arrhythmias following ablation both on and off of Isuprel 4. No early apparent complications.  04/26/2018: EPS/ablation CONCLUSIONS: 1. Sinus rhythm upon presentation.   2. Successful electrical isolation and anatomical encircling of all four pulmonary veins with radiofrequency current. 3. No inducible arrhythmias following ablation both on and off of Isuprel 4. No early apparent complications.  Risk Assessment/Calculations:        Physical Exam:   VS:  There were no vitals taken for this visit.   Wt Readings  from Last 3 Encounters:  06/12/23 193 lb 6.4 oz (87.7 kg)  05/04/23 185 lb 12.8 oz (84.3 kg)  02/20/23 198 lb (89.8 kg)    GEN: Well nourished, well developed in no acute distress NECK: No JVD; No carotid bruits CARDIAC: RRR, bradycardic, no murmurs, rubs, gallops RESPIRATORY:  CTA b/l, wheezing or rhonchi  ABDOMEN: Soft, non-tender, non-distended EXTREMITIES: No edema; No deformity   ASSESSMENT AND PLAN: .    Paroxysmal Afib CHA2DS2Vasc is one, not on a/c  ***  HTN No meds, looks good   Asymptomatic bradycardia On no nodal blocking agents   5. New syncope (?) Sept 2024 He firmly denies syncope *** None since off meds       Dispo: back in 20mo, sooner if needed  Signed, Sheilah Pigeon, PA-C

## 2023-07-13 ENCOUNTER — Ambulatory Visit: Payer: Medicare Other | Admitting: Physician Assistant

## 2023-07-21 ENCOUNTER — Other Ambulatory Visit: Payer: Self-pay | Admitting: Family Medicine

## 2023-07-21 DIAGNOSIS — K219 Gastro-esophageal reflux disease without esophagitis: Secondary | ICD-10-CM

## 2023-09-02 ENCOUNTER — Other Ambulatory Visit: Payer: Self-pay | Admitting: Family Medicine

## 2024-02-04 ENCOUNTER — Other Ambulatory Visit: Payer: Self-pay | Admitting: Family Medicine

## 2024-02-04 DIAGNOSIS — K219 Gastro-esophageal reflux disease without esophagitis: Secondary | ICD-10-CM

## 2024-02-04 NOTE — Telephone Encounter (Unsigned)
 Copied from CRM [id number]. Topic: Clinical - Medication Refill >> Feb 04, 2024  1:47 PM DeAngela L wrote: Medication: pantoprazole  (PROTONIX ) 40 MG tablet   Has the patient contacted their pharmacy? No  (Agent: If no, request that the patient contact the pharmacy for the refill. If patient does not wish to contact the pharmacy document the reason why and proceed with request.) (Agent: If yes, when and what did the pharmacy advise?)  This is the patient's preferred pharmacy:  [facility] [address] ([address]), [place] - [address] [place] ([place]) [place] [address] Phone: [phone] Fax: [phone]  Is this the correct pharmacy for this prescription? Yes  If no, delete pharmacy and type the correct one.   Has the prescription been filled recently? Yes   Is the patient out of the medication? Yes   Has the patient been seen for an appointment in the last year OR does the patient have an upcoming appointment? Yes   Can we respond through MyChart? Yes   Agent: Please be advised that Rx refills may take up to 3 business days. We ask that you follow-up with your pharmacy.

## 2024-02-05 NOTE — Telephone Encounter (Signed)
 Requested medication (s) are due for refill today: yes  Requested medication (s) are on the active medication list: yes  Last refill:  07/23/23 #90   Future visit scheduled:  yes  Notes to clinic:  > 3 months overdue form last appt- unable for NT to reorder   Requested Prescriptions  Pending Prescriptions Disp Refills   pantoprazole  (PROTONIX ) 40 MG tablet 90 tablet 0    Sig: Take 1 tablet (40 mg total) by mouth daily.     Gastroenterology: Proton Pump Inhibitors Failed - 02/05/2024  2:03 PM      Failed - Valid encounter within last 12 months    Recent Outpatient Visits           1 year ago Type 2 diabetes mellitus without complication, without long-term current use of insulin (HCC)   [facility], Ray Caffey, MD   1 year ago Type 2 diabetes mellitus without complication, without long-term current use of insulin (HCC)   [facility], Ray Caffey, MD   1 year ago Type 2 diabetes mellitus without complication, without long-term current use of insulin (HCC)   [place] [facility], Ray Caffey, MD   2 years ago Type 2 diabetes mellitus without complication, without long-term current use of insulin (HCC)   [place] [facility], MD   2 years ago Hospital discharge follow-up   [facility], Annalee Barren, NP

## 2024-02-06 ENCOUNTER — Ambulatory Visit (INDEPENDENT_AMBULATORY_CARE_PROVIDER_SITE_OTHER)

## 2024-02-06 ENCOUNTER — Ambulatory Visit (INDEPENDENT_AMBULATORY_CARE_PROVIDER_SITE_OTHER): Admitting: Physician Assistant

## 2024-02-06 ENCOUNTER — Other Ambulatory Visit: Payer: Self-pay

## 2024-02-06 VITALS — BP 163/99 | HR 59

## 2024-02-06 DIAGNOSIS — R61 Generalized hyperhidrosis: Secondary | ICD-10-CM | POA: Diagnosis not present

## 2024-02-06 DIAGNOSIS — Z8679 Personal history of other diseases of the circulatory system: Secondary | ICD-10-CM

## 2024-02-06 DIAGNOSIS — R109 Unspecified abdominal pain: Secondary | ICD-10-CM

## 2024-02-06 DIAGNOSIS — I10 Essential (primary) hypertension: Secondary | ICD-10-CM

## 2024-02-06 DIAGNOSIS — Z720 Tobacco use: Secondary | ICD-10-CM

## 2024-02-06 DIAGNOSIS — R21 Rash and other nonspecific skin eruption: Secondary | ICD-10-CM

## 2024-02-06 DIAGNOSIS — Z9884 Bariatric surgery status: Secondary | ICD-10-CM

## 2024-02-06 LAB — POCT URINALYSIS DIP (CLINITEK)
Bilirubin, UA: NEGATIVE
Blood, UA: NEGATIVE
Glucose, UA: NEGATIVE mg/dL
Ketones, POC UA: NEGATIVE mg/dL
Leukocytes, UA: NEGATIVE
Nitrite, UA: NEGATIVE
POC PROTEIN,UA: 30 — AB
Spec Grav, UA: 1.025 (ref 1.010–1.025)
Urobilinogen, UA: 0.2 U/dL
pH, UA: 6 (ref 5.0–8.0)

## 2024-02-06 MED ORDER — PANTOPRAZOLE SODIUM 40 MG PO TBEC: 40.0000 mg | DELAYED_RELEASE_TABLET | Freq: Every day | ORAL | 0 refills | Status: DC

## 2024-02-06 MED ORDER — TAMSULOSIN HCL 0.4 MG PO CAPS: 0.4000 mg | ORAL_CAPSULE | Freq: Every day | ORAL | 0 refills | Status: DC

## 2024-02-06 NOTE — Telephone Encounter (Signed)
 Complete

## 2024-02-06 NOTE — Progress Notes (Signed)
 Patient ID: Samuel Flynn, male   DOB: 09-Jan-1976, 48 y.o.   MRN: [id number]   Samuel Flynn, is a 48 y.o. male  NWG:[id number]  [id number]:[id number]  DOB - Jul 14, 1976  Chief Complaint  Patient presents with   overheating    Happens whenever and patient gets hot and sweaty and feel as though he will pass out- last episode lastred 2 1/2 hours. Confusing as well    Back Pain    Kidney stones        Subjective:   Samuel Flynn is a 48 y.o. male here today for hot and cold flashes with occasional night sweats.  He has been having this for about 6 to 9 months.  Also c/o near syncope episodes where he feels like he is going to pass out-esp when he gets up in the night.  He does drink alcohol but not specific with quantities.  He is about 2 years s/p gastric bypass.  Drinks gatorade but not really water.  Has been on anticoagulation previously for afib but has not had to take in a while but has also not seen cardiology in about 2 years.  He does feel at time like his heart is beating hard.  Not currently.  No CP/SOB.  No N/V.  Smokes some but says he has cut back.    He also feels like he may have a kidney stone with sharp R flank pains for the past couple weeks.  He thinks he may have had some blood in urine the other day.    No problems updated.  ALLERGIES: No Known Allergies  PAST MEDICAL HISTORY: Past Medical History:  Diagnosis Date   Atrial fibrillation with RVR (HCC)    Gastric ulcer    Gout    Hypertension    Paroxysmal A-fib (HCC)    Sleep apnea    USES CPAP   Wears glasses     MEDICATIONS AT HOME: Prior to Admission medications   Medication Sig Start Date End Date Taking? Authorizing Provider  tamsulosin  (FLOMAX ) 0.4 MG CAPS capsule Take 1 capsule (0.4 mg total) by mouth daily. To help with possible kidney stone 02/06/24  Yes Niajah Sipos M, PA-C  acetaminophen  (TYLENOL ) 325 MG tablet Take 2 tablets (650 mg total) by mouth every 4 (four) hours as needed for  headache or mild pain. 04/18/21   Zhao, Xika, NP  allopurinol  (ZYLOPRIM ) 100 MG tablet Take 1 tablet (100 mg total) by mouth daily. ADDITIONAL REFILLS FROM PRIMARY CARE 10/30/22   Maudine Sos, MD  ammonium lactate  (LAC-HYDRIN ) 12 % lotion APPLY  LOTION TOPICALLY TO AFFECTED AREA AS NEEDED FOR DRY SKIN 09/13/23   Abraham Abo, MD  colchicine  0.6 MG tablet TAKE 1 TABLET BY MOUTH ONCE DAILY AS NEEDED FOR GOUT 12/14/22   Abraham Abo, MD  furosemide  (LASIX ) 40 MG tablet TAKE 1 TABLET BY MOUTH TWICE DAILY . APPOINTMENT REQUIRED FOR FUTURE REFILLS 07/02/23   Debbie Fails, PA-C  pantoprazole  (PROTONIX ) 40 MG tablet Take 1 tablet (40 mg total) by mouth daily. 02/06/24   Rogerio Clay, Amy J, NP    ROS: Neg HEENT Neg resp Neg GI Neg GU Neg MS Neg psych Neg neuro  Objective:   Vitals:   02/06/24 1506  BP: (!) 163/99  Pulse: (!) 59  SpO2: 97%   Exam General appearance : Awake, alert, not in any distress. Speech Clear. Not toxic looking HEENT: Atraumatic and Normocephalic Neck: Supple, no JVD. No cervical lymphadenopathy.  Chest: Good air entry  bilaterally, CTAB.  No rales/rhonchi/wheezing CVS: S1 S2 regular, no murmurs.  Extremities: B/L Lower Ext shows no edema, both legs are warm to touch Neurology: Awake alert, and oriented X 3, CN II-XII intact, Non focal Skin: PR type appearing rash on abdomen and trunk  Data Review Lab Results  Component Value Date   HGBA1C 5.2 09/15/2022   HGBA1C 6.1 (A) 03/16/2022   HGBA1C 7.0 (A) 09/16/2021    Assessment & Plan   1. Night sweats (Primary) Increase water intake. - DG Chest 2 View - Comprehensive metabolic panel - TSH - CBC with Differential  2. Right flank pain Flomax  and increase water intake - POCT URINALYSIS DIP (CLINITEK) - DG Abd 1 View - Comprehensive metabolic panel  3. History of atrial fibrillation - Ambulatory referral to Cardiology-lost to follow up.  EKG ok today - Comprehensive metabolic panel - TSH - CBC  with Differential  4. Essential hypertension, benign Take meds as directed.  Avoid alcohol - Ambulatory referral to Cardiology - Comprehensive metabolic panel  5. H/O gastric bypass - Comprehensive metabolic panel - TSH - CBC with Differential  6. Rash and nonspecific skin eruption Has appt with derm  7. Tobacco use Smoking and dangers of nicotine have been discussed at length. Long term health consequences of smoking reviewed in detail.  Methods for helping with cessation have been reviewed.  Patient expresses understanding.     Return in about 4 weeks (around 03/05/2024) for PCP for chronic conditions-recheck.  The patient was given clear instructions to go to ER or return to medical center if symptoms don't improve, worsen or new problems develop. The patient verbalized understanding. The patient was told to call to get lab results if they haven't heard anything in the next week.      Dulce Gibbs, PA-C [facility] [place], [place] [phone]   02/06/2024, 4:52 PM

## 2024-02-06 NOTE — Patient Instructions (Addendum)
 Please drink 64 to 80 ounces water daily.    Smoking cessation is advised.    I have sent medication to help with possibly passing a kidney stone.    Your EKG WAS ESSENTIALLY NORMAL.  iF YOU DEVELOP cHEST PAIN, shortness of breath, or pass out, please call 911./  I have placed a referral to your cardiologist.  I will let you know when I get your xrays back.  Avoid drinking any alcohol for now

## 2024-02-08 ENCOUNTER — Ambulatory Visit: Payer: Self-pay | Admitting: Physician Assistant

## 2024-02-08 LAB — COMPREHENSIVE METABOLIC PANEL WITH GFR
ALT: 11 IU/L (ref 0–44)
AST: 17 IU/L (ref 0–40)
Albumin: 4.1 g/dL (ref 4.1–5.1)
Alkaline Phosphatase: 154 IU/L — ABNORMAL HIGH (ref 44–121)
BUN/Creatinine Ratio: 10 (ref 9–20)
BUN: 11 mg/dL (ref 6–24)
Bilirubin Total: 0.9 mg/dL (ref 0.0–1.2)
CO2: 22 mmol/L (ref 20–29)
Calcium: 7.9 mg/dL — ABNORMAL LOW (ref 8.7–10.2)
Chloride: 106 mmol/L (ref 96–106)
Creatinine, Ser: 1.07 mg/dL (ref 0.76–1.27)
Globulin, Total: 2.3 g/dL (ref 1.5–4.5)
Glucose: 74 mg/dL (ref 70–99)
Potassium: 4.4 mmol/L (ref 3.5–5.2)
Sodium: 141 mmol/L (ref 134–144)
Total Protein: 6.4 g/dL (ref 6.0–8.5)
eGFR: 85 mL/min/{1.73_m2} (ref >59)

## 2024-02-08 LAB — CBC WITH DIFFERENTIAL/PLATELET
Basophils Absolute: 0.1 10*3/uL (ref 0.0–0.2)
Basos: 1 %
EOS (ABSOLUTE): 0.1 10*3/uL (ref 0.0–0.4)
Eos: 2 %
Hematocrit: 41.9 % (ref 37.5–51.0)
Hemoglobin: 14.1 g/dL (ref 13.0–17.7)
Immature Grans (Abs): 0 10*3/uL (ref 0.0–0.1)
Immature Granulocytes: 0 %
Lymphocytes Absolute: 1.2 10*3/uL (ref 0.7–3.1)
Lymphs: 15 %
MCH: 29.6 pg (ref 26.6–33.0)
MCHC: 33.7 g/dL (ref 31.5–35.7)
MCV: 88 fL (ref 79–97)
Monocytes Absolute: 0.5 10*3/uL (ref 0.1–0.9)
Monocytes: 6 %
Neutrophils Absolute: 6.2 10*3/uL (ref 1.4–7.0)
Neutrophils: 76 %
Platelets: 256 10*3/uL (ref 150–450)
RBC: 4.76 x10E6/uL (ref 4.14–5.80)
RDW: 16.8 % — ABNORMAL HIGH (ref 11.6–15.4)
WBC: 8.1 10*3/uL (ref 3.4–10.8)

## 2024-02-08 LAB — TSH: TSH: 1.55 u[IU]/mL (ref 0.450–4.500)

## 2024-03-11 ENCOUNTER — Ambulatory Visit: Admitting: Family Medicine

## 2024-03-11 ENCOUNTER — Encounter: Payer: Self-pay | Admitting: Family Medicine

## 2024-03-17 ENCOUNTER — Ambulatory Visit: Admitting: Family Medicine

## 2024-03-30 NOTE — Progress Notes (Unsigned)
 Cardiology Office Note:  .   Date:  03/30/2024  ID:  Samuel Flynn, DOB 08/05/1976, MRN [id number] PCP: [name], MD   [facility] Providers Cardiologist:  Annabella Scarce, MD Electrophysiologist:  Will Gladis Norton, MD {   History of Present Illness: Samuel Flynn is a 48 y.o. male w/PMHx of  HTN, OSA w/CPAP,  AFib   He saw Dr. Norton 09/20/21, doing well, on episode of AFib since starting dofetilide , noted some higher then usual BPs of late, started on Toprol   A couple telephone visits via the pre-op team   Needs DOT paperwork/note  I saw him 05/04/23 He has gastric bypass surgery last October, has lost >100 pounds and really feels great since he has lost the weight. Reports good energy, very physically active job, gets in the yard, no difficulties with ADLs. No CP, palpitations or cardiac awareness No dizzy spells, near syncope or syncope. No SOB, DOE Planned to wear a monitor to assess HR No changes were made, not felt to have any driving restrictions  Admitted to [facility] after a syncopal event 05/29/23 (no longer on the monitor), evaluated by cardiology/EP service  Underwent an echocardiogram with LVEF 55 to 60% with normal wall motion and normal diastolic function and trace heart multi valvular regurgitation present. Orthostatics that were collected at admission were negative. Patient was noted to be consistently with his heart rates in the 30s to 40s with episodic rises due to chronotropic competency to the 60s and low 70s. Cardiology was very instrumental in the patient's care during this hospitalization. They have since recommended that the patient follow-up with his primary electrophysiologist cardiologist at [facility] health. Stated that there was no clear indication for pacemaker insertion during this hospitalization. It was thought that his vasovagal symptoms could have been secondary to his rapid weight loss with his prior gastric sleeve performed  in 2023. Per cardiology note When his heart rate is in high 30s and 40s, he is completely asymptomatic. There is no evidence of chronotropic incompetence. Upon walking, his heart rate increased to 90 bpm with no exertional symptoms. There is no clear indication for pacemaker insertion  Cardiology has recommended reducing his Tikosyn  from 500 mg twice daily to 250 mg twice daily with continue Xarelto  use for anticoagulation  Discharged 06/01/23  06/01/23 BUN/Creat 11/1.11 K+ 4.4 Mag 1.8  05/30/23 H/H 12/36  I saw him 06/12/23 He is accompanied by his wife. He reports about 2-3 mo of spells, feels weak. Starts by feeling hot/sweaty, then feels weak, episodes can last 10 minutes He had not mentioned them before given they weren't all that bad but the day he went to the hospital was a strong episode. He is firm and clear, he has never fainted, and not really felt like he was going to, but weak. His wife reports he looks pale, and afterwards specifically feels really wiped out. His HR is funky afterwards, with double beats, perhaps skipped beats. He reports during his stay they gave him none of his meds and he felt better, since home has taken none and really feel better, no recurrent episodes His wife sees a clear improvement in him as well. Denies CP, SOB He feels like he would certainly know if he has Af and has not. PLAN He preferred to stay off Tikosyn  Shared decision with risk score of one to stay off a/c He preferred to stay off as many meds as able Discussed need for re-hospitalization to resume Tikosyn , he  understood and mentioned he would nt want to go back to it it anyway  TODAY  *** symptoms, ? [place]? AF? *** OSA? CPAP? *** weak spells?  AFib/AAD hx PVI ablation 04/26/2018 PVI ablation 11/07/2019 AFlutter noted in 2022,  1:1 suspect 2/2 flecainide  > stopped Tikosyn  started Nov 2022 >> stopped Oct 2024 > patient preference   Studies Reviewed: SABRA     EKG not done  today  05/30/23: TTE ([facility]) Left Ventricle: Left ventricle size is normal.    Left Ventricle: Systolic function is normal. EF: 55-60%. Quantitative  analysis of left ventricular Global Longitudinal Strain (GLS) imaging is  normal at -18.700%. Ejection fraction measured by 3D is normal 64%,    Right Ventricle: Right ventricle size is normal.    Right Ventricle: Systolic function is normal.   Sept 2024: monitor Patient had a min HR of 35 bpm, max HR of 190 bpm, and avg HR of 58 bpm.  Predominant underlying rhythm was Sinus Rhythm.  7 Supraventricular Tachycardia runs occurred, fastest interval lasting 6 beats with a max rate of 190 bpm, longest 16 beats with an avg rate of 129 bpm.  Some episodes of Supraventricular Tachycardia conducted with possible aberrancy. Some episodes of Supraventricular Tachycardia may be possible  4.1% supraventricular ectopy Less than 1% ventricular ectopy Patient triggered episodes associated with sinus rhythm, PVCs, PACs, and AIVR   04/18/21: TEE 1. Left ventricular ejection fraction, by estimation, is 60 to 65%. The  left ventricle has normal function.   2. Right ventricular systolic function is normal. The right ventricular  size is normal.   3. No left atrial/left atrial appendage thrombus was detected.   4. The mitral valve is normal in structure. Mild mitral valve  regurgitation.   5. The aortic valve is grossly normal. Aortic valve regurgitation is  trivial.   11/07/19: EPS/ablation CONCLUSIONS: 1. Sinus rhythm upon presentation.   2. Successful electrical isolation and anatomical encircling of all four pulmonary veins with radiofrequency current. 3. No inducible arrhythmias following ablation both on and off of Isuprel 4. No early apparent complications.  04/26/2018: EPS/ablation CONCLUSIONS: 1. Sinus rhythm upon presentation.   2. Successful electrical isolation and anatomical encircling of all four pulmonary veins with radiofrequency  current. 3. No inducible arrhythmias following ablation both on and off of Isuprel 4. No early apparent complications.  Risk Assessment/Calculations:        Physical Exam:   VS:  There were no vitals taken for this visit.   Wt Readings from Last 3 Encounters:  06/12/23 193 lb 6.4 oz (87.7 kg)  05/04/23 185 lb 12.8 oz (84.3 kg)  02/20/23 198 lb (89.8 kg)    GEN: Well nourished, well developed in no acute distress NECK: No JVD; No carotid bruits CARDIAC: *** RRR, bradycardic, no murmurs, rubs, gallops RESPIRATORY: *** CTA b/l, wheezing or rhonchi  ABDOMEN: Soft, non-tender, non-distended EXTREMITIES: *** No edema; No deformity   ASSESSMENT AND PLAN: .    Paroxysmal Afib CHA2DS2Vasc is one *** burden by symptoms off AAD  HTN No meds, *** looks good  Asymptomatic bradycardia On no nodal blocking agents   5. New syncope He has firmly denied hx of  syncope Described weak spells mentioned in the past ***        Dispo: back in ***, sooner if needed  Signed, Charlies Macario Arthur, PA-C

## 2024-03-31 ENCOUNTER — Observation Stay (HOSPITAL_COMMUNITY)
Admission: EM | Admit: 2024-03-31 | Discharge: 2024-04-01 | Disposition: A | Discharge status: Home / Self Care | Attending: Cardiology | Admitting: Cardiology

## 2024-03-31 ENCOUNTER — Encounter (HOSPITAL_COMMUNITY): Payer: Self-pay

## 2024-03-31 ENCOUNTER — Emergency Department (HOSPITAL_COMMUNITY)

## 2024-03-31 ENCOUNTER — Other Ambulatory Visit: Payer: Self-pay

## 2024-03-31 ENCOUNTER — Encounter (HOSPITAL_COMMUNITY)
Admission: EM | Disposition: A | Discharge status: Home / Self Care | Payer: Self-pay | Source: Home / Self Care | Attending: Emergency Medicine

## 2024-03-31 DIAGNOSIS — I4892 Unspecified atrial flutter: Secondary | ICD-10-CM | POA: Diagnosis present

## 2024-03-31 DIAGNOSIS — I21A1 Myocardial infarction type 2: Secondary | ICD-10-CM | POA: Diagnosis not present

## 2024-03-31 DIAGNOSIS — I5032 Chronic diastolic (congestive) heart failure: Secondary | ICD-10-CM | POA: Insufficient documentation

## 2024-03-31 DIAGNOSIS — I214 Non-ST elevation (NSTEMI) myocardial infarction: Principal | ICD-10-CM | POA: Insufficient documentation

## 2024-03-31 DIAGNOSIS — D500 Iron deficiency anemia secondary to blood loss (chronic): Secondary | ICD-10-CM | POA: Insufficient documentation

## 2024-03-31 DIAGNOSIS — G4733 Obstructive sleep apnea (adult) (pediatric): Secondary | ICD-10-CM | POA: Insufficient documentation

## 2024-03-31 DIAGNOSIS — I2510 Atherosclerotic heart disease of native coronary artery without angina pectoris: Secondary | ICD-10-CM

## 2024-03-31 DIAGNOSIS — D649 Anemia, unspecified: Secondary | ICD-10-CM | POA: Insufficient documentation

## 2024-03-31 DIAGNOSIS — R7989 Other specified abnormal findings of blood chemistry: Secondary | ICD-10-CM | POA: Insufficient documentation

## 2024-03-31 DIAGNOSIS — R001 Bradycardia, unspecified: Secondary | ICD-10-CM | POA: Diagnosis not present

## 2024-03-31 DIAGNOSIS — Z9884 Bariatric surgery status: Secondary | ICD-10-CM | POA: Insufficient documentation

## 2024-03-31 DIAGNOSIS — I110 Hypertensive heart disease with heart failure: Secondary | ICD-10-CM | POA: Diagnosis not present

## 2024-03-31 DIAGNOSIS — Z7982 Long term (current) use of aspirin: Secondary | ICD-10-CM | POA: Diagnosis not present

## 2024-03-31 DIAGNOSIS — R079 Chest pain, unspecified: Secondary | ICD-10-CM | POA: Diagnosis present

## 2024-03-31 DIAGNOSIS — Z87891 Personal history of nicotine dependence: Secondary | ICD-10-CM | POA: Insufficient documentation

## 2024-03-31 DIAGNOSIS — I480 Paroxysmal atrial fibrillation: Secondary | ICD-10-CM | POA: Diagnosis not present

## 2024-03-31 DIAGNOSIS — I10 Essential (primary) hypertension: Secondary | ICD-10-CM | POA: Insufficient documentation

## 2024-03-31 HISTORY — PX: LEFT HEART CATH AND CORONARY ANGIOGRAPHY: CATH118249

## 2024-03-31 LAB — CBC
HCT: 43.7 % (ref 39.0–52.0)
HCT: 36.3 % — ABNORMAL LOW (ref 39.0–52.0)
Hemoglobin: 14.6 g/dL (ref 13.0–17.0)
Hemoglobin: 12.7 g/dL — ABNORMAL LOW (ref 13.0–17.0)
MCH: 29.1 pg (ref 26.0–34.0)
MCH: 30 pg (ref 26.0–34.0)
MCHC: 33.4 g/dL (ref 30.0–36.0)
MCHC: 35 g/dL (ref 30.0–36.0)
MCV: 87.2 fL (ref 80.0–100.0)
MCV: 85.8 fL (ref 80.0–100.0)
Platelets: 251 K/uL (ref 150–400)
Platelets: 192 K/uL (ref 150–400)
RBC: 5.01 MIL/uL (ref 4.22–5.81)
RBC: 4.23 MIL/uL (ref 4.22–5.81)
RDW: 17.4 % — ABNORMAL HIGH (ref 11.5–15.5)
RDW: 17.4 % — ABNORMAL HIGH (ref 11.5–15.5)
WBC: 10.1 K/uL (ref 4.0–10.5)
WBC: 7.3 K/uL (ref 4.0–10.5)
nRBC: 0 % (ref 0.0–0.2)
nRBC: 0 % (ref 0.0–0.2)

## 2024-03-31 LAB — COMPREHENSIVE METABOLIC PANEL WITH GFR
ALT: 12 U/L (ref 0–44)
AST: 20 U/L (ref 15–41)
Albumin: 3.6 g/dL (ref 3.5–5.0)
Alkaline Phosphatase: 111 U/L (ref 38–126)
Anion gap: 11 (ref 5–15)
BUN: 10 mg/dL (ref 6–20)
CO2: 21 mmol/L — ABNORMAL LOW (ref 22–32)
Calcium: 7.9 mg/dL — ABNORMAL LOW (ref 8.9–10.3)
Chloride: 108 mmol/L (ref 98–111)
Creatinine, Ser: 1.16 mg/dL (ref 0.61–1.24)
GFR, Estimated: >60 mL/min (ref >60)
Glucose, Bld: 86 mg/dL (ref 70–99)
Potassium: 4.2 mmol/L (ref 3.5–5.1)
Sodium: 140 mmol/L (ref 135–145)
Total Bilirubin: 1.3 mg/dL — ABNORMAL HIGH (ref 0.0–1.2)
Total Protein: 6.7 g/dL (ref 6.5–8.1)

## 2024-03-31 LAB — MAGNESIUM: Magnesium: 2.1 mg/dL (ref 1.7–2.4)

## 2024-03-31 LAB — CREATININE, SERUM
Creatinine, Ser: 0.97 mg/dL (ref 0.61–1.24)
GFR, Estimated: >60 mL/min (ref >60)

## 2024-03-31 LAB — TROPONIN I (HIGH SENSITIVITY)
Troponin I (High Sensitivity): 199 ng/L (ref <18)
Troponin I (High Sensitivity): 695 ng/L (ref <18)

## 2024-03-31 LAB — HIV ANTIBODY (ROUTINE TESTING W REFLEX): HIV Screen 4th Generation wRfx: NONREACTIVE

## 2024-03-31 SURGERY — LEFT HEART CATH AND CORONARY ANGIOGRAPHY
Anesthesia: LOCAL

## 2024-03-31 MED ORDER — ASPIRIN 81 MG PO CHEW: 324.0000 mg | CHEWABLE_TABLET | ORAL | Status: DC

## 2024-03-31 MED ORDER — ACETAMINOPHEN 325 MG PO TABS
650.0000 mg | ORAL_TABLET | ORAL | Status: DC | PRN
  Filled 2024-03-31: qty 2

## 2024-03-31 MED ORDER — HEPARIN BOLUS VIA INFUSION
4000.0000 [IU] | Freq: Once | INTRAVENOUS | Status: AC
  Administered 2024-03-31: 4000 [IU] via INTRAVENOUS
  Filled 2024-03-31: qty 4000

## 2024-03-31 MED ORDER — ONDANSETRON HCL 4 MG/2ML IJ SOLN: 4.0000 mg | Freq: Four times a day (QID) | INTRAMUSCULAR | Status: DC | PRN

## 2024-03-31 MED ORDER — VERAPAMIL HCL 2.5 MG/ML IV SOLN
INTRAVENOUS | Status: AC
  Filled 2024-03-31: qty 2

## 2024-03-31 MED ORDER — SODIUM CHLORIDE 0.9 % WEIGHT BASED INFUSION: 1.0000 mL/kg/h | INTRAVENOUS | Status: DC

## 2024-03-31 MED ORDER — SODIUM CHLORIDE 0.9 % WEIGHT BASED INFUSION
3.0000 mL/kg/h | INTRAVENOUS | Status: DC
  Administered 2024-03-31: 3 mL/kg/h via INTRAVENOUS

## 2024-03-31 MED ORDER — ROSUVASTATIN CALCIUM 20 MG PO TABS
20.0000 mg | ORAL_TABLET | Freq: Every day | ORAL | Status: DC
  Administered 2024-03-31: 20 mg via ORAL
  Filled 2024-03-31: qty 1

## 2024-03-31 MED ORDER — HEPARIN (PORCINE) IN NACL 1000-0.9 UT/500ML-% IV SOLN
INTRAVENOUS | Status: DC | PRN
  Administered 2024-03-31: 1000 mL via INTRA_ARTERIAL

## 2024-03-31 MED ORDER — VERAPAMIL HCL 2.5 MG/ML IV SOLN
INTRAVENOUS | Status: DC | PRN
  Administered 2024-03-31: 10 mL via INTRA_ARTERIAL

## 2024-03-31 MED ORDER — FENTANYL CITRATE (PF) 100 MCG/2ML IJ SOLN
INTRAMUSCULAR | Status: DC | PRN
  Administered 2024-03-31: 25 ug via INTRAVENOUS

## 2024-03-31 MED ORDER — MIDAZOLAM HCL 2 MG/2ML IJ SOLN
INTRAMUSCULAR | Status: AC
  Filled 2024-03-31: qty 2

## 2024-03-31 MED ORDER — HEPARIN SODIUM (PORCINE) 1000 UNIT/ML IJ SOLN
INTRAMUSCULAR | Status: DC | PRN
  Administered 2024-03-31: 4000 [IU] via INTRAVENOUS

## 2024-03-31 MED ORDER — LIDOCAINE HCL (PF) 1 % IJ SOLN
INTRAMUSCULAR | Status: DC | PRN
  Administered 2024-03-31: 2 mL

## 2024-03-31 MED ORDER — ASPIRIN 81 MG PO CHEW
324.0000 mg | CHEWABLE_TABLET | Freq: Once | ORAL | Status: AC
  Administered 2024-03-31: 324 mg via ORAL
  Filled 2024-03-31: qty 4

## 2024-03-31 MED ORDER — ENOXAPARIN SODIUM 40 MG/0.4ML IJ SOSY
40.0000 mg | PREFILLED_SYRINGE | INTRAMUSCULAR | Status: DC
  Filled 2024-03-31: qty 0.4

## 2024-03-31 MED ORDER — SODIUM CHLORIDE 0.9% FLUSH: 3.0000 mL | INTRAVENOUS | Status: DC | PRN

## 2024-03-31 MED ORDER — ASPIRIN 81 MG PO CHEW
81.0000 mg | CHEWABLE_TABLET | ORAL | Status: DC
  Filled 2024-03-31: qty 1

## 2024-03-31 MED ORDER — LIDOCAINE HCL (PF) 1 % IJ SOLN
INTRAMUSCULAR | Status: AC
  Filled 2024-03-31: qty 30

## 2024-03-31 MED ORDER — MIDAZOLAM HCL 2 MG/2ML IJ SOLN
INTRAMUSCULAR | Status: DC | PRN
  Administered 2024-03-31: 2 mg via INTRAVENOUS

## 2024-03-31 MED ORDER — IOHEXOL 350 MG/ML SOLN
INTRAVENOUS | Status: DC | PRN
  Administered 2024-03-31: 55 mL via INTRA_ARTERIAL

## 2024-03-31 MED ORDER — HEPARIN (PORCINE) 25000 UT/250ML-% IV SOLN
1000.0000 [IU]/h | INTRAVENOUS | Status: DC
  Administered 2024-03-31: 1000 [IU]/h via INTRAVENOUS
  Filled 2024-03-31: qty 250

## 2024-03-31 MED ORDER — SODIUM CHLORIDE 0.9 % IV BOLUS
1000.0000 mL | Freq: Once | INTRAVENOUS | Status: AC
  Administered 2024-03-31: 1000 mL via INTRAVENOUS

## 2024-03-31 MED ORDER — NITROGLYCERIN 0.4 MG SL SUBL: 0.4000 mg | SUBLINGUAL_TABLET | SUBLINGUAL | Status: DC | PRN

## 2024-03-31 MED ORDER — SODIUM CHLORIDE 0.9 % IV SOLN: 250.0000 mL | INTRAVENOUS | Status: DC | PRN

## 2024-03-31 MED ORDER — HEPARIN SODIUM (PORCINE) 1000 UNIT/ML IJ SOLN
INTRAMUSCULAR | Status: AC
Start: 2024-03-31 — End: 2024-03-31
  Filled 2024-03-31: qty 10

## 2024-03-31 MED ORDER — TRAZODONE HCL 50 MG PO TABS
50.0000 mg | ORAL_TABLET | Freq: Every evening | ORAL | Status: DC | PRN
  Administered 2024-03-31: 50 mg via ORAL
  Filled 2024-03-31: qty 1

## 2024-03-31 MED ORDER — FENTANYL CITRATE (PF) 100 MCG/2ML IJ SOLN
INTRAMUSCULAR | Status: AC
Start: 2024-03-31 — End: 2024-03-31
  Filled 2024-03-31: qty 2

## 2024-03-31 MED ORDER — ASPIRIN 300 MG RE SUPP: 300.0000 mg | RECTAL | Status: DC

## 2024-03-31 MED ORDER — SODIUM CHLORIDE 0.9 % IV SOLN: INTRAVENOUS | Status: AC

## 2024-03-31 MED ORDER — ASPIRIN 81 MG PO TBEC
81.0000 mg | DELAYED_RELEASE_TABLET | Freq: Every day | ORAL | Status: DC
  Administered 2024-04-01: 81 mg via ORAL
  Filled 2024-03-31: qty 1

## 2024-03-31 MED ORDER — SODIUM CHLORIDE 0.9% FLUSH
3.0000 mL | Freq: Two times a day (BID) | INTRAVENOUS | Status: DC
  Administered 2024-04-01: 3 mL via INTRAVENOUS

## 2024-03-31 SURGICAL SUPPLY — 7 items
CATH 5FR JL3.5 JR4 ANG PIG MP (CATHETERS) IMPLANT
DEVICE RAD COMP TR BAND LRG (VASCULAR PRODUCTS) IMPLANT
GLIDESHEATH SLEND SS 6F .021 (SHEATH) IMPLANT
GUIDEWIRE INQWIRE 1.5J.035X260 (WIRE) IMPLANT
PACK CARDIAC CATHETERIZATION (CUSTOM PROCEDURE TRAY) ×2 IMPLANT
SHEATH PROBE COVER 6X72 (BAG) IMPLANT
TRANSDUCER W/STOPCOCK (MISCELLANEOUS) IMPLANT

## 2024-03-31 NOTE — Interval H&P Note (Signed)
 History and Physical Interval Note:  03/31/2024 3:55 PM  Samuel Flynn  has presented today for surgery, with the diagnosis of NSTEMI.  The various methods of treatment have been discussed with the patient and family. After consideration of risks, benefits and other options for treatment, the patient has consented to  Procedure(s): LEFT HEART CATH AND CORONARY ANGIOGRAPHY (N/A) as a surgical intervention.  The patient's history has been reviewed, patient examined, no change in status, stable for surgery.  I have reviewed the patient's chart and labs.  Questions were answered to the patient's satisfaction.   Cath Lab Visit (complete for each Cath Lab visit)  Clinical Evaluation Leading to the Procedure:   ACS: Yes.    Non-ACS:    Anginal Classification: CCS IV  Anti-ischemic medical therapy: No Therapy  Non-Invasive Test Results: No non-invasive testing performed  Prior CABG: No previous CABG        Maude [facility] 03/31/2024 3:55 PM

## 2024-03-31 NOTE — ED Triage Notes (Signed)
 Pt arrives POV complaining of palpitations and chest pain that woke him up this morning. Has a history of AFIB and was taking medication for it but does not take any medication anymore due to having a gastric bypass in 2023. Denies sharp pain and palpitations on arrival but states his chest feels sore.

## 2024-03-31 NOTE — ED Notes (Signed)
 CCMD called and added to the monitor

## 2024-03-31 NOTE — ED Notes (Signed)
 CCMD contacted of patients move to 45

## 2024-03-31 NOTE — Progress Notes (Signed)
 PHARMACY - ANTICOAGULATION CONSULT NOTE  Pharmacy Consult for heparin  initiation Indication: chest pain/ACS  No Known Allergies  Patient Measurements: Height: 6' 2 (188 cm) Weight: 81.6 kg (180 lb) IBW/kg (Calculated) : 82.2 HEPARIN  DW (KG): 81.6  Vital Signs: Temp: 97.5 F (36.4 C) (07/28 1218) Temp Source: Temporal (07/28 1218) BP: 116/91 (07/28 1330) Pulse Rate: 48 (07/28 1330)  Labs: Recent Labs    03/31/24 1039 03/31/24 1216  HGB 14.6  --   HCT 43.7  --   PLT 251  --   CREATININE 1.16  --   TROPONINIHS 199* 695*    Estimated Creatinine Clearance: 88.9 mL/min (by C-G formula based on SCr of 1.16 mg/dL).   Medical History: Past Medical History:  Diagnosis Date   Atrial fibrillation with RVR (HCC)    Gastric ulcer    Gout    Hypertension    Paroxysmal A-fib (HCC)    Sleep apnea    USES CPAP   Wears glasses     Medications:  No anticoagulants PTA  Assessment: 47 YOM w/ hx AF s/p ablation with NSTEMI. Pt was previously on xarelto  for AF but per pt has not taken it in 1-2 years as his AF improved following weight loss after gastric bypass surgery. Fill history shows last fill of xarelto  in 05/2023. HGB 14.6, PLT 251.   Goal of Therapy:  Heparin  level 0.3-0.7 units/ml Monitor platelets by anticoagulation protocol: Yes   Plan:  Give IV heparin  4000 units bolus x 1 Start heparin  infusion at 1000 units/hr Check anti-Xa level in 6 hours and daily while on heparin  Continue to monitor H&H and platelets    Belvie Macintosh, PharmD Candidate  03/31/2024,1:37 PM

## 2024-03-31 NOTE — H&P (Addendum)
 Cardiology Admission History and Physical   Patient ID: Samuel Flynn MRN: [id number]; DOB: April 12, 1976   Admission date: 03/31/2024  PCP:  [name], MD   [facility] Providers Cardiologist:  Annabella Scarce, MD  Electrophysiologist:  Will Gladis Norton, MD   Chief Complaint:  Chest pain   Patient Profile: Samuel Flynn is a 48 y.o. male with a past medical history of atrial fibrillation s/p ablation, hypertension, OSA, s/p gastric bypass who is being seen 03/31/2024 for the evaluation of chest pain.  History of Present Illness: Samuel Flynn is a 48 year old male with above medical history.  Per chart review, patient previously underwent ETT in 02/2018 that was clinically negative.  Electrically diagnostic as patient did not achieve target HR. Underwent afib ablation on 04/26/18. Had recurrent oc afib. Echo in 07/2019 showed EF 55-60%, mild LVH, normal RV size and function, mild MR. Underwent cardiac CT 11/04/2019 showed no significant coronary artery disease, coronary calcium  score of 0.  Underwent repeat ablation 11/07/2019.  In 04/2021, patient was admitted with atrial flutter, acute diastolic heart failure.  Echo 04/16/2021 showed EF 55-60%, no regional wall motion abnormalities, normal RV systolic function, no significant valvular abnormalities.  Underwent TEE guided cardioversion with conversion to normal sinus rhythm.  In 06/2021, patient was seen in the ER with atrial flutter and underwent cardioversion.  Started on flecainide .  Again admitted in 07/2021 with atrial flutter.  He underwent flecainide  washout and was started on Tikosyn .  Saw Dr. Norton 09/2021 and was doing well.  Had some elevated blood pressures and was started on Toprol .  He had gastric bypass in 06/2022 with significant weight loss.  Admitted to [facility] after syncopal episode in 05/2023.  Echocardiogram showed EF 55-60% with normal wall motion, normal diastolic function. He was noted to be  bradycardic with HR in the 30s-40s. He was asymptomatic and there was no evidence of chronotropic incompetence. His tikosyn  was reduced to 250 mg BID.   Seen by EP in 06/2023. He reported that her would prefer to not be on medications. Tikosyn  was stopped.   Patient presented to the ED on 7/28 complaining of palpitations and chest pain. Initial vital signs showed BP 109/83, HR 59 BPM, oxygen  100%. EKG showed NSR, HR 60 BPM. Labs significant for K 4.2, creatinine 1.16, calcium  7.9, WBC 10.1, hemoglobin 14.6, platelets 251. hsTn [id number]. Cardiology consulted.   On interview, patient reports that he has been in his usual state of health recently.  He has not been on any cardiac medications recently.  He denies recent chest pain or shortness of breath.  He woke up this morning around 3 AM with palpitations and chest pain.  Describes the chest pain as a soreness.  Also had a little bit of dizziness and shortness of breath.  Reports that his symptoms were similar to what he has felt with atrial fibrillation in the past.  In addition to his usual symptoms, he also had some facial and arm tingling which is new.  Reports that his palpitations lasted a few hours, resolved before his arrival to the ER.  He is currently feeling well.  No further chest pain or palpitations  Past Medical History:  Diagnosis Date   Atrial fibrillation with RVR (HCC)    Gastric ulcer    Gout    Hypertension    Paroxysmal A-fib (HCC)    Sleep apnea    USES CPAP   Wears glasses    Past Surgical History:  Procedure Laterality  Date   ATRIAL FIBRILLATION ABLATION N/A 04/26/2018   Procedure: ATRIAL FIBRILLATION ABLATION;  Surgeon: Inocencio Soyla Lunger, MD;  Location: [facility] INVASIVE CV LAB;  Service: Cardiovascular;  Laterality: N/A;   ATRIAL FIBRILLATION ABLATION N/A 11/07/2019   Procedure: ATRIAL FIBRILLATION ABLATION;  Surgeon: Inocencio Soyla Lunger, MD;  Location: [facility] INVASIVE CV LAB;  Service: Cardiovascular;  Laterality: N/A;    CARDIOVERSION N/A 01/18/2018   Procedure: CARDIOVERSION;  Surgeon: Rolan Ezra RAMAN, MD;  Location: [facility] ENDOSCOPY;  Service: Cardiovascular;  Laterality: N/A;   CARDIOVERSION N/A 08/19/2019   Procedure: CARDIOVERSION;  Surgeon: Alveta Aleene PARAS, MD;  Location: [facility] ENDOSCOPY;  Service: Cardiovascular;  Laterality: N/A;   CARDIOVERSION N/A 04/18/2021   Procedure: CARDIOVERSION;  Surgeon: Alveta Aleene PARAS, MD;  Location: [facility] ENDOSCOPY;  Service: Cardiovascular;  Laterality: N/A;   ELBOW LIGAMENT RECONSTRUCTION Right 09/29/2014   Procedure: HECTOR FRACTURE FIXATION, POSSIBLE LIGAMENT REPAIR;  Surgeon: Eva Elsie Herring, MD;  Location: [facility];  Service: Orthopedics;  Laterality: Right;  Right elbow coranoid fracture fixation, possible ligament repair   TEE WITHOUT CARDIOVERSION N/A 01/18/2018   Procedure: TRANSESOPHAGEAL ECHOCARDIOGRAM (TEE);  Surgeon: Rolan Ezra RAMAN, MD;  Location: [facility] ENDOSCOPY;  Service: Cardiovascular;  Laterality: N/A;   TEE WITHOUT CARDIOVERSION N/A 04/18/2021   Procedure: TRANSESOPHAGEAL ECHOCARDIOGRAM (TEE);  Surgeon: Alveta Aleene PARAS, MD;  Location: [facility] ENDOSCOPY;  Service: Cardiovascular;  Laterality: N/A;   WISDOM TOOTH EXTRACTION       Medications Prior to Admission: Prior to Admission medications   Medication Sig Start Date End Date Taking? Authorizing Provider  ibuprofen  (ADVIL ) 200 MG tablet Take 400 mg by mouth daily as needed for headache or moderate pain (pain score 4-6).   Yes [provider]  OVER THE COUNTER MEDICATION Take 1 tablet by mouth daily. OTC gastric supplement   Yes [provider]  pantoprazole  (PROTONIX ) 40 MG tablet Take 1 tablet (40 mg total) by mouth daily. 02/06/24  Yes Lorren Greig PARAS, NP     Allergies:   No Known Allergies  Social History:   Social History   Socioeconomic History   Marital status: Married    Spouse name: Not on file   Number of children: 4   Years of education: 10   Highest  education level: 8th grade  Occupational History   Occupation: Truck Air traffic controller: [employer]  Tobacco Use   Smoking status: Former    Types: Cigars    Quit date: 07/20/2021    Years since quitting: 2.6   Smokeless tobacco: Never   Tobacco comments:    Former smoker 08/16/21  Vaping Use   Vaping status: Never Used  Substance and Sexual Activity   Alcohol use: Not Currently    Comment: occ   Drug use: No   Sexual activity: Yes    Comment: 5 a day  Other Topics Concern   Not on file  Social History Narrative   Not on file   Social Drivers of Health   Financial Resource Strain: Low Risk  (02/06/2024)   Overall Financial Resource Strain (CARDIA)    Difficulty of Paying Living Expenses: Not hard at all  Food Insecurity: No Food Insecurity (02/06/2024)   Hunger Vital Sign    Worried About Running Out of Food in the Last Year: Never true    Ran Out of Food in the Last Year: Never true  Transportation Needs: No Transportation Needs (02/06/2024)   PRAPARE - Transportation    Lack  of Transportation (Medical): No    Lack of Transportation (Non-Medical): No  Physical Activity: Unknown (02/06/2024)   Exercise Vital Sign    Days of Exercise per Week: 0 days    Minutes of Exercise per Session: Not on file  Stress: Stress Concern Present (02/06/2024)   [employer] of Occupational Health - Occupational Stress Questionnaire    Feeling of Stress : Very much  Social Connections: Moderately Isolated (02/06/2024)   Social Connection and Isolation Panel    Frequency of Communication with Friends and Family: More than three times a week    Frequency of Social Gatherings with Friends and Family: More than three times a week    Attends Religious Services: Never    Database administrator or Organizations: No    Attends Engineer, structural: Not on file    Marital Status: Married  Catering manager Violence: Not At Risk (05/29/2023)   Received from [facility]   HITS     Over the last 12 months how often did your partner physically hurt you?: Never    Over the last 12 months how often did your partner insult you or talk down to you?: Never    Over the last 12 months how often did your partner threaten you with physical harm?: Never    Over the last 12 months how often did your partner scream or curse at you?: Never     Family History:  The patient's family history includes Heart attack in his father; Heart disease in his father; Hypertension in his father, maternal grandmother, mother, paternal uncle, paternal uncle, paternal uncle, paternal uncle, and paternal uncle; Stroke in his father. There is no history of Colon cancer.    ROS:  Please see the history of present illness.  All other ROS reviewed and negative.     Physical Exam/Data: Vitals:   03/31/24 1200 03/31/24 1218 03/31/24 1230 03/31/24 1330  BP: 112/86  114/81 (!) 116/91  Pulse: (!) 47  (!) 47 (!) 48  Resp: 16  15 17   Temp:  (!) 97.5 F (36.4 C)    TempSrc:  Temporal    SpO2: 100%  100% 100%  Weight:      Height:       No intake or output data in the 24 hours ending 03/31/24 1355    03/31/2024   10:27 AM 06/12/2023   10:10 AM 05/04/2023    8:05 AM  Last 3 Weights  Weight (lbs) 180 lb 193 lb 6.4 oz 185 lb 12.8 oz  Weight (kg) 81.647 kg 87.726 kg 84.278 kg     Body mass index is 23.11 kg/m.  General:  Well nourished, well developed, in no acute distress. Laying in the bed with head elevated  HEENT: normal Neck: no  JVD  Vascular: Radial pulses 2+ bilaterally   Cardiac:  normal S1, S2; RRR; no murmur   Lungs:  clear to auscultation bilaterally, no wheezing, rhonchi or rales  Abd: soft, nontender  Ext: no  edema in BLE Musculoskeletal:  No deformities  Skin: warm and dry  Neuro:  CNs 2-12 intact, no focal abnormalities noted Psych:  Normal affect   EKG:  The ECG that was done 7/28 was personally reviewed and demonstrates NSR, HR 60 BPM   Relevant CV Studies: Cardiac Studies  & Procedures   ______________________________________________________________________________________________   STRESS TESTS  EXERCISE TOLERANCE TEST (ETT) 02/07/2018  Interpretation Summary  Blood pressure demonstrated a normal response to exercise.  Clinically negative.  The patient experienced no chest pain. Stopped due to cramping in legs  Electrically nondiagnostic as patient did not achieve target heart rate or adequate pressure rate product.   ECHOCARDIOGRAM  ECHOCARDIOGRAM COMPLETE 04/16/2021  Narrative ECHOCARDIOGRAM REPORT    Patient Name:   JONG RICKMAN [facility] Date of Exam: 04/16/2021 Medical Rec #:  [id number]          Height:       74.0 in Accession #:    [id number]         Weight:       315.9 lb Date of Birth:  May 31, 1976         BSA:          2.640 m Patient Age:    44 years           BP:           114/79 mmHg Patient Gender: M                  HR:           94 bpm. Exam Location:  Inpatient  Procedure: 2D Echo, Cardiac Doppler, Color Doppler and Intracardiac Opacification Agent  Indications:    Atrial Fibrillation I48.91  History:        Patient has prior history of Echocardiogram examinations, most recent 07/14/2019. Arrythmias:Atrial Fibrillation; Risk Factors:Hypertension, Sleep Apnea and Former Smoker.  Sonographer:    Recardo Hedge RDCS Referring Phys: [id number] [facility] N DUNN   Sonographer Comments: Image acquisition challenging due to patient body habitus. IMPRESSIONS   1. Left ventricular ejection fraction, by estimation, is 55 to 60%. The left ventricle has normal function. The left ventricle has no regional wall motion abnormalities. The left ventricular internal cavity size was mildly dilated. There is mild left ventricular hypertrophy. Left ventricular diastolic parameters are indeterminate. 2. Right ventricular systolic function is normal. The right ventricular size is normal. 3. The mitral valve is normal in structure. No evidence of mitral valve  regurgitation. No evidence of mitral stenosis. 4. The aortic valve is tricuspid. Aortic valve regurgitation is not visualized. No aortic stenosis is present. 5. The inferior vena cava is normal in size with greater than 50% respiratory variability, suggesting right atrial pressure of 3 mmHg.  FINDINGS Left Ventricle: Left ventricular ejection fraction, by estimation, is 55 to 60%. The left ventricle has normal function. The left ventricle has no regional wall motion abnormalities. Definity  contrast agent was given IV to delineate the left ventricular endocardial borders. The left ventricular internal cavity size was mildly dilated. There is mild left ventricular hypertrophy. Left ventricular diastolic parameters are indeterminate.  Right Ventricle: The right ventricular size is normal. Right ventricular systolic function is normal.  Left Atrium: Left atrial size was normal in size.  Right Atrium: Right atrial size was normal in size.  Pericardium: There is no evidence of pericardial effusion.  Mitral Valve: The mitral valve is normal in structure. No evidence of mitral valve regurgitation. No evidence of mitral valve stenosis.  Tricuspid Valve: The tricuspid valve is normal in structure. Tricuspid valve regurgitation is trivial. No evidence of tricuspid stenosis.  Aortic Valve: The aortic valve is tricuspid. Aortic valve regurgitation is not visualized. No aortic stenosis is present.  Pulmonic Valve: The pulmonic valve was not well visualized. Pulmonic valve regurgitation is not visualized. No evidence of pulmonic stenosis.  Aorta: The aortic root is normal in size and structure.  Venous: The inferior vena cava is normal in size with greater than  50% respiratory variability, suggesting right atrial pressure of 3 mmHg.  IAS/Shunts: No atrial level shunt detected by color flow Doppler.   LEFT VENTRICLE PLAX 2D LVIDd:         5.90 cm      Diastology LVIDs:         4.70 cm      LV e'  medial:    6.83 cm/s LV PW:         1.20 cm      LV E/e' medial:  7.8 LV IVS:        1.20 cm      LV e' lateral:   6.30 cm/s LVOT diam:     2.60 cm      LV E/e' lateral: 8.5 LV SV:         49 LV SV Index:   18 LVOT Area:     5.31 cm  LV Volumes (MOD) LV vol d, MOD A4C: 152.0 ml LV vol s, MOD A4C: 61.8 ml LV SV MOD A4C:     152.0 ml  RIGHT VENTRICLE RV S prime:     8.48 cm/s TAPSE (M-mode): 1.2 cm  LEFT ATRIUM             Index       RIGHT ATRIUM           Index LA diam:        4.40 cm 1.67 cm/m  RA Area:     17.00 cm LA Vol (A2C):   44.1 ml 16.71 ml/m RA Volume:   42.40 ml  16.06 ml/m LA Vol (A4C):   36.7 ml 13.90 ml/m LA Biplane Vol: 41.2 ml 15.61 ml/m AORTIC VALVE LVOT Vmax:   51.80 cm/s LVOT Vmean:  36.300 cm/s LVOT VTI:    0.092 m  AORTA Ao Root diam: 3.70 cm Ao Asc diam:  3.40 cm  MITRAL VALVE MV Area (PHT): 5.79 cm    SHUNTS MV Decel Time: 131 msec    Systemic VTI:  0.09 m MV E velocity: 53.50 cm/s  Systemic Diam: 2.60 cm MV A velocity: 47.40 cm/s MV E/A ratio:  1.13  Redell Shallow MD Electronically signed by Redell Shallow MD Signature Date/Time: 04/16/2021/10:32:15 AM    Final   TEE  ECHO TEE 04/18/2021  Narrative TRANSESOPHOGEAL ECHO REPORT    Patient Name:   ZAN ORLICK [facility] Date of Exam: 04/18/2021 Medical Rec #:  [id number]          Height:       74.0 in Accession #:    [id number]         Weight:       315.0 lb Date of Birth:  30-Mar-1976         BSA:          2.636 m Patient Age:    44 years           BP:           119/58 mmHg Patient Gender: M                  HR:           114 bpm. Exam Location:  Inpatient  Procedure: Transesophageal Echo and Color Doppler  Indications:     I48.91* Unspecified atrial fibrillation  History:         Patient has prior history of Echocardiogram examinations, most recent 04/16/2021. CHF, Arrythmias:Atrial Fibrillation; Risk Factors:Sleep Apnea and Hypertension.  Sonographer:  [facility] Senior  RDCS Referring Phys:  [id number] [facility] A SANTO Diagnosing Phys: Aleene Passe MD  PROCEDURE: After discussion of the risks and benefits of a TEE, an informed consent was obtained from the patient. The transesophogeal probe was passed without difficulty through the esophogus of the patient. Sedation performed by different physician. The patient was monitored while under deep sedation. Anesthestetic sedation was provided intravenously by Anesthesiology: 484mg  of Propofol , 100mg  of Lidocaine . The patient developed no complications during the procedure. A successful direct current cardioversion was performed at 200 joules with 1 attempt.  IMPRESSIONS   1. Left ventricular ejection fraction, by estimation, is 60 to 65%. The left ventricle has normal function. 2. Right ventricular systolic function is normal. The right ventricular size is normal. 3. No left atrial/left atrial appendage thrombus was detected. 4. The mitral valve is normal in structure. Mild mitral valve regurgitation. 5. The aortic valve is grossly normal. Aortic valve regurgitation is trivial.  FINDINGS Left Ventricle: Left ventricular ejection fraction, by estimation, is 60 to 65%. The left ventricle has normal function. The left ventricular internal cavity size was normal in size.  Right Ventricle: The right ventricular size is normal. No increase in right ventricular wall thickness. Right ventricular systolic function is normal.  Left Atrium: Left atrial size was normal in size. No left atrial/left atrial appendage thrombus was detected.  Right Atrium: Right atrial size was normal in size.  Pericardium: There is no evidence of pericardial effusion.  Mitral Valve: The mitral valve is normal in structure. Mild mitral valve regurgitation.  Tricuspid Valve: The tricuspid valve is grossly normal. Tricuspid valve regurgitation is trivial.  Aortic Valve: The aortic valve is grossly normal. Aortic valve regurgitation is  trivial.  Pulmonic Valve: The pulmonic valve was not well visualized. Pulmonic valve regurgitation is not visualized.  Aorta: The aortic root and ascending aorta are structurally normal, with no evidence of dilitation.  IAS/Shunts: The atrial septum is grossly normal.  Aleene Passe MD Electronically signed by Aleene Passe MD Signature Date/Time: 04/18/2021/3:33:47 PM    Final  MONITORS  LONG TERM MONITOR (3-14 DAYS) 05/31/2023  Narrative Patch Wear Time:  2 days and 23 hours  Patient had a min HR of 35 bpm, max HR of 190 bpm, and avg HR of 58 bpm. Predominant underlying rhythm was Sinus Rhythm. 7 Supraventricular Tachycardia runs occurred, fastest interval lasting 6 beats with a max rate of 190 bpm, longest 16 beats with an avg rate of 129 bpm. Some episodes of Supraventricular Tachycardia conducted with possible aberrancy. Some episodes of Supraventricular Tachycardia may be possible 4.1% supraventricular ectopy Less than 1% ventricular ectopy Patient triggered episodes associated with sinus rhythm, PVCs, PACs, and AIVR  Will Camnitz, MD       ______________________________________________________________________________________________       Laboratory Data: High Sensitivity Troponin:   Recent Labs  Lab 03/31/24 1039 03/31/24 1216  TROPONINIHS 199* 695*      Chemistry Recent Labs  Lab 03/31/24 1039  NA 140  K 4.2  CL 108  CO2 21*  GLUCOSE 86  BUN 10  CREATININE 1.16  CALCIUM  7.9*  MG 2.1  GFRNONAA >60  ANIONGAP 11    Recent Labs  Lab 03/31/24 1039  PROT 6.7  ALBUMIN 3.6  AST 20  ALT 12  ALKPHOS 111  BILITOT 1.3*   Lipids No results for input(s): CHOL, TRIG, HDL, LABVLDL, LDLCALC, CHOLHDL in the last 168 hours. Hematology Recent Labs  Lab 03/31/24 1039  WBC 10.1  RBC  5.01  HGB 14.6  HCT 43.7  MCV 87.2  MCH 29.1  MCHC 33.4  RDW 17.4*  PLT 251   Thyroid  No results for input(s): TSH, FREET4 in the last 168  hours. BNPNo results for input(s): BNP, PROBNP in the last 168 hours.  DDimer No results for input(s): DDIMER in the last 168 hours.  Radiology/Studies:  DG Chest 2 View Result Date: 03/31/2024 CLINICAL DATA:  Palpitations and chest pain since this morning, history of atrial fibrillation EXAM: CHEST - 2 VIEW COMPARISON:  02/06/2024 FINDINGS: Frontal and lateral views of the chest demonstrate an unremarkable cardiac silhouette. Mild hyperinflation, without acute airspace disease, effusion, or pneumothorax. No acute bony abnormalities. IMPRESSION: 1. No acute intrathoracic process. Electronically Signed   By: Ozell Daring M.D.   On: 03/31/2024 13:43     Assessment and Plan:  NSTEMI  - Patient presented to the ED complaining of palpitations, chest discomfort.  Reports symptoms similar to what he had with A-fib episodes in the past.  EKG showed normal sinus rhythm with heart rate 60 bpm, no ischemic changes.  High-sensitivity troponin 199> 695 - Presentation concerning for NSTEMI.  Start IV heparin  per pharmacy consult - Patient has already been given aspirin  324 mg.  Start aspirin  81 mg daily tomorrow a.m. - Start Crestor  20 mg daily -Patient is currently chest pain-free.  Plan for left heart catheterization in a.m. - Ordered fasting lipid panel, A1c  History of atrial fibrillation and flutter - Status post atrial fibrillation ablations in 2019, 2021.  Was previously on Tikosyn  but opted to stop this in 06/2023 - Patient woke up this a.m. with palpitations, chest pain.  Reports feeling like he was back in atrial fib.  By the time he was in the ER, his palpitations had resolved and EKG/telemetry showed normal sinus rhythm -Continue to monitor on telemetry - Consider cardiac monitor at discharge for A-fib monitoring - Patient has not been on [facility] as an outpatient- low CHADS-VASc (1) and patient felt like his afib symptoms were clear. Starting IV heparin  above for NSTEMI. If he has afib  recurrence this admission, can consider DOAC prior to DC  Risk Assessment/Risk Scores:  TIMI Risk Score for Unstable Angina or Non-ST Elevation MI:   The patient's TIMI risk score is 1, which indicates a 5% risk of all cause mortality, new or recurrent myocardial infarction or need for urgent revascularization in the next 14 days.{    Code Status: Full Code  Severity of Illness: The appropriate patient status for this patient is INPATIENT. Inpatient status is judged to be reasonable and necessary in order to provide the required intensity of service to ensure the patient's safety. The patient's presenting symptoms, physical exam findings, and initial radiographic and laboratory data in the context of their chronic comorbidities is felt to place them at high risk for further clinical deterioration. Furthermore, it is not anticipated that the patient will be medically stable for discharge from the hospital within 2 midnights of admission.   * I certify that at the point of admission it is my clinical judgment that the patient will require inpatient hospital care spanning beyond 2 midnights from the point of admission due to high intensity of service, high risk for further deterioration and high frequency of surveillance required.*  For questions or updates, please contact [facility] Please consult [email or web address] for contact info under     Signed, Rollo FABIENE Louder, PA-C  03/31/2024 1:55 PM   Personally seen and examined.  Agree with above.  48 year old smoker post atrial fibrillation ablation with hypertension obstructive sleep apnea and gastric bypass here with chest discomfort, pressure which woke him up out of his sleep at around 3 AM.  Currently feeling some mild pressure.  Troponin increased to 700.  EKG shows sharply peaked T waves in the anterior precordial leads.  Looking back in 2022 he did have some biphasic/T wave inversion in the anterior precordial leads.  Regular  rate and rhythm, lungs are clear, tanned skin  Non-STEMI - We will go ahead and proceed with left heart catheterization.  Risks and benefits of been explained including stroke cardiac death renal bleeding. -Question possibility of LAD stenosis given his EKG changes. -Aspirin , statin  Atrial fibrillation paroxysmal - 2 prior ablations.  Does not wish to take Tikosyn .  Currently sinus bradycardia.  Oneil Parchment, MD

## 2024-03-31 NOTE — ED Notes (Signed)
 Patient transported to X-ray

## 2024-03-31 NOTE — ED Notes (Signed)
Cath lab staff here to take patient to cath lab.

## 2024-03-31 NOTE — ED Provider Notes (Signed)
  EMERGENCY DEPARTMENT AT [facility] Provider Note   CSN: [id number] Arrival date & time: 03/31/24  1008     Patient presents with: Palpitations   Samuel Flynn is a 48 y.o. male.  With a history of A-fib, heart failure type 2 diabetes who presents to the ED for palpitations.  Patient awoke this morning with palpitations and chest discomfort.  Felt as though he was in A-fib.  Status post ablation 2019.  Is no longer taking medications for atrial fibrillation.  No inciting infectious symptoms.  May be slightly dehydrated after working outside yesterday.  No active chest pain now and is no longer in A-fib.  Has an upcoming cardiology appointment in 2 days    Palpitations      Prior to Admission medications   Medication Sig Start Date End Date Taking? Authorizing Provider  ibuprofen  (ADVIL ) 200 MG tablet Take 400 mg by mouth daily as needed for headache or moderate pain (pain score 4-6).   Yes [provider]  OVER THE COUNTER MEDICATION Take 1 tablet by mouth daily. OTC gastric supplement   Yes [provider]  pantoprazole  (PROTONIX ) 40 MG tablet Take 1 tablet (40 mg total) by mouth daily. 02/06/24  Yes Lorren Greig PARAS, NP    Allergies: Patient has no known allergies.    Review of Systems  Cardiovascular:  Positive for palpitations.    Updated Vital Signs BP 115/73   Pulse (!) 44   Temp (!) 97.5 F (36.4 C) (Temporal)   Resp 15   Ht 6' 2 (1.88 m)   Wt 81.6 kg   SpO2 100%   BMI 23.11 kg/m   Physical Exam Vitals and nursing note reviewed.  HENT:     Head: Normocephalic and atraumatic.  Eyes:     Pupils: Pupils are equal, round, and reactive to light.  Cardiovascular:     Rate and Rhythm: Normal rate and regular rhythm.  Pulmonary:     Effort: Pulmonary effort is normal.     Breath sounds: Normal breath sounds.  Abdominal:     Palpations: Abdomen is soft.     Tenderness: There is no abdominal tenderness.  Skin:     General: Skin is warm and dry.  Neurological:     Mental Status: He is alert.  Psychiatric:        Mood and Affect: Mood normal.     (all labs ordered are listed, but only abnormal results are displayed) Labs Reviewed  CBC - Abnormal; Notable for the following components:      Result Value   RDW 17.4 (*)    All other components within normal limits  COMPREHENSIVE METABOLIC PANEL WITH GFR - Abnormal; Notable for the following components:   CO2 21 (*)    Calcium  7.9 (*)    Total Bilirubin 1.3 (*)    All other components within normal limits  TROPONIN I (HIGH SENSITIVITY) - Abnormal; Notable for the following components:   Troponin I (High Sensitivity) 199 (*)    All other components within normal limits  TROPONIN I (HIGH SENSITIVITY) - Abnormal; Notable for the following components:   Troponin I (High Sensitivity) 695 (*)    All other components within normal limits  MAGNESIUM   HEPARIN  LEVEL (UNFRACTIONATED)  HIV ANTIBODY (ROUTINE TESTING W REFLEX)    EKG: EKG Interpretation Date/Time:  Monday March 31 2024 10:28:08 EDT Ventricular Rate:  60 PR Interval:  140 QRS Duration:  84 QT Interval:  386 QTC  Calculation: 386 R Axis:   56  Text Interpretation: Normal sinus rhythm Normal ECG When compared with ECG of 06-Feb-2024 15:56, PREVIOUS ECG IS PRESENT Confirmed by Pamella Sharper [phone]) on 03/31/2024 11:55:44 AM  Radiology: [facility] Chest 2 View Result Date: 03/31/2024 CLINICAL DATA:  Palpitations and chest pain since this morning, history of atrial fibrillation EXAM: CHEST - 2 VIEW COMPARISON:  02/06/2024 FINDINGS: Frontal and lateral views of the chest demonstrate an unremarkable cardiac silhouette. Mild hyperinflation, without acute airspace disease, effusion, or pneumothorax. No acute bony abnormalities. IMPRESSION: 1. No acute intrathoracic process. Electronically Signed   By: Sharper Daring M.D.   On: 03/31/2024 13:43     .Critical Care  Performed by: Pamella Sharper LABOR,  DO Authorized by: Pamella Sharper LABOR, DO   Critical care provider statement:    Critical care time (minutes):  45   Critical care was necessary to treat or prevent imminent or life-threatening deterioration of the following conditions:  Cardiac failure   Critical care was time spent personally by me on the following activities:  Development of treatment plan with patient or surrogate, discussions with consultants, evaluation of patient's response to treatment, examination of patient, ordering and review of laboratory studies, ordering and review of radiographic studies, ordering and performing treatments and interventions, pulse oximetry, re-evaluation of patient's condition and review of old charts   I assumed direction of critical care for this patient from another provider in my specialty: no     Care discussed with: admitting provider   Comments:     Discussed with cardiology team    Medications Ordered in the ED  heparin  ADULT infusion 100 units/mL (25000 units/252mL) (1,000 Units/hr Intravenous New Bag/Given 03/31/24 1359)  aspirin  EC tablet 81 mg (has no administration in time range)  nitroGLYCERIN  (NITROSTAT ) SL tablet 0.4 mg (has no administration in time range)  acetaminophen  (TYLENOL ) tablet 650 mg (has no administration in time range)  ondansetron  (ZOFRAN ) injection 4 mg (has no administration in time range)  rosuvastatin  (CRESTOR ) tablet 20 mg (has no administration in time range)  aspirin  chewable tablet 81 mg (0 mg Oral Hold 03/31/24 1530)  0.9% sodium chloride  infusion (3 mL/kg/hr  81.6 kg Intravenous New Bag/Given 03/31/24 1529)    Followed by  0.9% sodium chloride  infusion (has no administration in time range)  sodium chloride  0.9 % bolus 1,000 mL (0 mLs Intravenous Stopped 03/31/24 1217)  aspirin  chewable tablet 324 mg (324 mg Oral Given 03/31/24 1217)  heparin  bolus via infusion 4,000 Units (4,000 Units Intravenous Bolus from Bag 03/31/24 1400)    Clinical Course as of  03/31/24 1533  Mon Mar 31, 2024  1156 Initial EKG shows normal sinus rhythm and no ischemic changes.  Initial troponin of 199.  No active chest pain at this time.  Patient mains in normal sinus rhythm.  Will repeat EKG and send delta troponin. [MP]  1211 Repeat EKG again demonstrates sinus rhythm at a rate of 52 with no interval ischemic changes.  Will discuss with cardiology and repeat troponin.  [MP]  1223 Discussed w cards [MP]  1332 Delta troponin significantly elevated from initial.  Now up to 695.  Will reach back out to cardiology to make them aware and initiate heparin  per NSTEMI protocol. [MP]  1532 Patient will be admitted to cardiology service.  Dr.Skains is admitting physician.  He remains hemodynamically stable here [MP]    Clinical Course User Index [MP] Pamella Sharper LABOR, DO  Medical Decision Making 48 year old male with history as above presents to the ED given concern for palpitations.  History of A-fib status post ablation.  Felt palpitations and chest comfort earlier this morning but asymptomatic now.  Afebrile and normal sinus rhythm on my exam.  No infectious trigger.  May be related to dehydration.  Will obtain cardiac workup to evaluate for ACS dysrhythmia and electrolyte imbalance.  Will provide IV fluids for rehydration and continue to monitor on telemetry.  If his workup is negative he will follow-up with his cardiologist as scheduled for 2 days  Amount and/or Complexity of Data Reviewed Labs: ordered. Radiology: ordered.  Risk OTC drugs. Prescription drug management. Decision regarding hospitalization.        Final diagnoses:  NSTEMI (non-ST elevated myocardial infarction) [facility])    ED Discharge Orders     None          Pamella Ozell LABOR, DO 03/31/24 1534

## 2024-03-31 NOTE — Progress Notes (Signed)
 TR band removed at 2003. Previous minimal oozing, none present when band removed. Pressure dressing in place. As evidence by grip strength, capillary refill, and radial pulse, patient is distally neurovascularly intact. Instructed patient on movement and weightbearing restrictions and to call for help if bleeding is noted.

## 2024-04-01 ENCOUNTER — Other Ambulatory Visit (HOSPITAL_COMMUNITY): Payer: Self-pay

## 2024-04-01 ENCOUNTER — Observation Stay (HOSPITAL_COMMUNITY)

## 2024-04-01 DIAGNOSIS — I480 Paroxysmal atrial fibrillation: Secondary | ICD-10-CM | POA: Diagnosis not present

## 2024-04-01 DIAGNOSIS — I214 Non-ST elevation (NSTEMI) myocardial infarction: Secondary | ICD-10-CM | POA: Diagnosis not present

## 2024-04-01 DIAGNOSIS — I21A1 Myocardial infarction type 2: Secondary | ICD-10-CM | POA: Diagnosis not present

## 2024-04-01 DIAGNOSIS — I110 Hypertensive heart disease with heart failure: Secondary | ICD-10-CM | POA: Diagnosis not present

## 2024-04-01 DIAGNOSIS — D649 Anemia, unspecified: Secondary | ICD-10-CM | POA: Insufficient documentation

## 2024-04-01 DIAGNOSIS — G4733 Obstructive sleep apnea (adult) (pediatric): Secondary | ICD-10-CM | POA: Diagnosis not present

## 2024-04-01 DIAGNOSIS — Z9884 Bariatric surgery status: Secondary | ICD-10-CM

## 2024-04-01 LAB — CBC
HCT: 34.9 % — ABNORMAL LOW (ref 39.0–52.0)
Hemoglobin: 11.9 g/dL — ABNORMAL LOW (ref 13.0–17.0)
MCH: 29.8 pg (ref 26.0–34.0)
MCHC: 34.1 g/dL (ref 30.0–36.0)
MCV: 87.5 fL (ref 80.0–100.0)
Platelets: 194 K/uL (ref 150–400)
RBC: 3.99 MIL/uL — ABNORMAL LOW (ref 4.22–5.81)
RDW: 17.4 % — ABNORMAL HIGH (ref 11.5–15.5)
WBC: 7.5 K/uL (ref 4.0–10.5)
nRBC: 0 % (ref 0.0–0.2)

## 2024-04-01 LAB — ECHOCARDIOGRAM COMPLETE
AR max vel: 3.78 cm2
AV Area VTI: 3.78 cm2
AV Area mean vel: 3.63 cm2
AV Mean grad: 3 mmHg
AV Peak grad: 5.2 mmHg
Ao pk vel: 1.14 m/s
Area-P 1/2: 4.29 cm2
Height: 74 in
S' Lateral: 3.7 cm
Weight: 2917.13 [oz_av]

## 2024-04-01 LAB — HEMOGLOBIN A1C
Hgb A1c MFr Bld: 4.7 % — ABNORMAL LOW (ref 4.8–5.6)
Mean Plasma Glucose: 88.19 mg/dL

## 2024-04-01 LAB — HEMOGLOBIN AND HEMATOCRIT, BLOOD
HCT: 36.1 % — ABNORMAL LOW (ref 39.0–52.0)
Hemoglobin: 12.3 g/dL — ABNORMAL LOW (ref 13.0–17.0)

## 2024-04-01 LAB — LIPID PANEL
Cholesterol: 87 mg/dL (ref 0–200)
HDL: 42 mg/dL (ref >40)
LDL Cholesterol: 37 mg/dL (ref 0–99)
Total CHOL/HDL Ratio: 2.1 ratio
Triglycerides: 40 mg/dL (ref <150)
VLDL: 8 mg/dL (ref 0–40)

## 2024-04-01 MED ORDER — ASPIRIN 81 MG PO TBEC
81.0000 mg | DELAYED_RELEASE_TABLET | Freq: Every day | ORAL | 12 refills | Status: DC
  Filled 2024-04-01: qty 30, 30d supply, fill #0

## 2024-04-01 MED ORDER — ROSUVASTATIN CALCIUM 20 MG PO TABS
20.0000 mg | ORAL_TABLET | Freq: Every day | ORAL | 3 refills | Status: DC
  Filled 2024-04-01: qty 90, 90d supply, fill #0

## 2024-04-01 NOTE — Progress Notes (Addendum)
 Discussed with pt and wife MI (type II), restrictions, smoking cessation, diet, exercise, and CRPII. Pt voiced reception. He is not ready to quit smoking. Will refer to [place] CRPII. Encouraged pt to ambulate. [phone] Aliene Aris BS, ACSM-CEP 04/01/2024 12:03 PM

## 2024-04-01 NOTE — Progress Notes (Addendum)
 Rounding Note   Patient Name: Samuel Flynn Date of Encounter: 04/01/2024  [facility] Cardiologist: Annabella Scarce, MD   Subjective Feeling fatigued today, felt like he slept well last night. Denied lightheadedness, dizziness, chest pain, shortness of breath, and peripheral edema. He has noticed palpitations.   Scheduled Meds:  aspirin  EC  81 mg Oral Daily   enoxaparin  (LOVENOX ) injection  40 mg Subcutaneous Q24H   rosuvastatin   20 mg Oral Q2000   sodium chloride  flush  3 mL Intravenous Q12H   Continuous Infusions:  sodium chloride      PRN Meds: sodium chloride , acetaminophen , nitroGLYCERIN , ondansetron  (ZOFRAN ) IV, sodium chloride  flush, traZODone    Vital Signs  Vitals:   03/31/24 2009 04/01/24 0042 04/01/24 0441 04/01/24 0723  BP: 135/78 113/82 115/68 113/78  Pulse: (!) 49 (!) 47 (!) 44 81  Resp: 16 16 16 16   Temp: (!) 97.5 F (36.4 C) 97.8 F (36.6 C) 98.1 F (36.7 C) 97.7 F (36.5 C)  TempSrc: Oral Oral Oral Oral  SpO2: 100% 98% 100% 99%  Weight:   82.7 kg   Height:        Intake/Output Summary (Last 24 hours) at 04/01/2024 0944 Last data filed at 04/01/2024 0900 Gross per 24 hour  Intake 358 ml  Output --  Net 358 ml      04/01/2024    4:41 AM 03/31/2024    6:57 PM 03/31/2024   10:27 AM  Last 3 Weights  Weight (lbs) 182 lb 5.1 oz 181 lb 7 oz 180 lb  Weight (kg) 82.7 kg 82.3 kg 81.647 kg      Telemetry Sinus bradycardia ~HR 40 - Personally Reviewed  ECG  Sinus bradycardia VR 44 - Personally Reviewed  Physical Exam General: Well developed, well nourished, male appearing in no acute distress. Head: Normocephalic, atraumatic.      Lungs:  Resp regular and unlabored, CTA. Heart: Sinus bradycardia with brief episode of regular, tachycardia, S1, S2, no S3, S4, or murmur; no rub. Extremities: No clubbing, cyanosis, edema. Distal pedal pulses are 2+ bilaterally. R radial cath site stable without bruising or hematoma Neuro: Alert and  oriented X 3. Moves all extremities spontaneously. Psych: Normal affect.  Labs High Sensitivity Troponin:   Recent Labs  Lab 03/31/24 1039 03/31/24 1216  TROPONINIHS 199* 695*     Chemistry Recent Labs  Lab 03/31/24 1039 03/31/24 1934  NA 140  --   K 4.2  --   CL 108  --   CO2 21*  --   GLUCOSE 86  --   BUN 10  --   CREATININE 1.16 0.97  CALCIUM  7.9*  --   MG 2.1  --   PROT 6.7  --   ALBUMIN 3.6  --   AST 20  --   ALT 12  --   ALKPHOS 111  --   BILITOT 1.3*  --   GFRNONAA >60 >60  ANIONGAP 11  --     Lipids  Recent Labs  Lab 04/01/24 0228  CHOL 87  TRIG 40  HDL 42  LDLCALC 37  CHOLHDL 2.1    Hematology Recent Labs  Lab 03/31/24 1039 03/31/24 1934 04/01/24 0228  WBC 10.1 7.3 7.5  RBC 5.01 4.23 3.99*  HGB 14.6 12.7* 11.9*  HCT 43.7 36.3* 34.9*  MCV 87.2 85.8 87.5  MCH 29.1 30.0 29.8  MCHC 33.4 35.0 34.1  RDW 17.4* 17.4* 17.4*  PLT 251 192 194   Thyroid  No results for input(s):  TSH, FREET4 in the last 168 hours.  BNPNo results for input(s): BNP, PROBNP in the last 168 hours.  DDimer No results for input(s): DDIMER in the last 168 hours.   Radiology  CARDIAC CATHETERIZATION Result Date: 03/31/2024   Mid LAD lesion is 30% stenosed.   Ost Cx to Prox Cx lesion is 20% stenosed.   The left ventricular systolic function is normal.   LV end diastolic pressure is normal.   The left ventricular ejection fraction is 55-65% by visual estimate. Nonobstructive CAD Normal LV function Normal LVEDP 8 mm Hg Plan: medical management.   DG Chest 2 View Result Date: 03/31/2024 CLINICAL DATA:  Palpitations and chest pain since this morning, history of atrial fibrillation EXAM: CHEST - 2 VIEW COMPARISON:  02/06/2024 FINDINGS: Frontal and lateral views of the chest demonstrate an unremarkable cardiac silhouette. Mild hyperinflation, without acute airspace disease, effusion, or pneumothorax. No acute bony abnormalities. IMPRESSION: 1. No acute intrathoracic process.  Electronically Signed   By: Ozell Daring M.D.   On: 03/31/2024 13:43    Patient Profile   48 y.o. male with a past medical history of atrial fibrillation and atrial flutter s/p ablation in 2019 with subsequent recurrence associated with acute diastolic HF, chronic sinus bradycardia not felt to require PPM, hypertension, and OSA uses CPAP who presented to the ED on 7/28 for chest pain and palpitations that woke him. In the ED he was in sinus bradycardia and had a troponin trend of 199-> 695. No ischemic changes noted on ECG. He was taken to the cath lab for NSTEMI.   Assessment & Plan  Type 2 NSTEMI Paroxysmal atrial fibrillation and atrial flutter LHC showed mild nonobstructive coronary artery disease. He was recommended for medical therapy and started on asa and crestor . Given lack of significant coronary disease, suspected driven by possible uncaptured arrhythmia. Echo pending this afternoon  Awoke to chest pain and palpitations that were similar to his previous atrial fibrillation episodes. It lasted approximately 6 hours prior to presentation.  Denied chest pain reoccurrence during admission. Per telemetry in sinus bradycardia with intermittent normal sinus rhythm. During physical exam today noted to go into a brief regular tachycardia that self terminated. He was not on telemetry during that time. Concerned that an episode of atrial fibrillation RVR may have precipitated this admission. He has an EP appointment scheduled for tomorrow for follow-up after stopping tikosyn  6 months ago due to patient preference. He may need to be reloaded or started on a new medication for rhythm control. Will defer starting medication or placing monitor at this time given his AF history and extensive work-up. He recently wore a heart monitor last year that showed episodes of SVT, low burden atrial fibrillation.  EP has historically deferred anticoagulation given low AF burden and low [place] Vasc score 2/2 to patient  preference for low medication burden. Since no evidence of AF on telemetry and palpitation episode lasting < 7 hours prior to admission will defer starting anticoagulation at this time.   Hemoglobin trending down [14.6 -> 11.9], has noted being more fatigued. Repeat H &H stable and ionized Ca pending. His R cath site was without edema and ecchymosis. Radial pulse 2 +. Dressing clean and dry.   Continue ASA 81 mg, started this admission Continue crestor  20 mg, started this admission.  Deferred AV nodal blocking agents due to sinus bradycardia, which is known for patient EP outpatient follow-up tomorrow.    Lipid panel LDL 37  HDL 42 Lipoprotein (a) pending   Fatigue Sinus bradycardia OSA on CPAP Normocytic anemia Patient today reported increasing fatigue. Mild drop in  Hgb noted, repeat H/H stable. Should f/u PCP for ongoing management of anemia, not on any blood thinner therapy aside from ASA. No bleeding reported.  Full Code  DVT ppx lovenox  DC most likely today pending echocardiogram and H&H results     For questions or updates, please contact [facility] Please consult [email or web address] for contact info under     Signed, Leontine LOISE Salen, PA-C  04/01/2024, 9:44 AM    Personally seen and examined. Agree with above.  Awaiting echocardiogram and lab work.  If reassuring, okay for discharge.  48 year old admitted with chest discomfort, had cardiac catheterization that showed no evidence of thrombosis.  Elevated troponin in the 800 range.  Diagnosis is type II myocardial infarction likely related to potential A-fib with RVR prior to hospitalization.  He felt palpitations previously.  Several months ago decided to discontinue Tikosyn  as well as anticoagulation.  Has had 2 atrial fibrillation ablations 2019 and 2021.  Has close follow-up with electrophysiology tomorrow.  Charlies Arthur.  Continue Crestor  20 mg for soft plaque stabilization.  Oneil Parchment, MD

## 2024-04-01 NOTE — Discharge Summary (Cosign Needed)
 Discharge Summary   Patient ID: Samuel Flynn MRN: [id number]; DOB: 22-Nov-1975  Admit date: 03/31/2024 Discharge date: 04/01/2024  PCP:  [name], MD   [facility] Providers Cardiologist:  Annabella Scarce, MD  Electrophysiologist:  Soyla Gladis Norton, MD      Discharge Diagnoses  Principal Problem:   NSTEMI (non-ST elevated myocardial infarction) [facility]) Active Problems:   Paroxysmal atrial fibrillation (HCC)   OSA (obstructive sleep apnea)   Sinus bradycardia   Hypocalcemia   History of gastric bypass   Normocytic anemia   Diagnostic Studies/Procedures  LHC 7/28     Mid LAD lesion is 30% stenosed.   Ost Cx to Prox Cx lesion is 20% stenosed.   The left ventricular systolic function is normal.   LV end diastolic pressure is normal.   The left ventricular ejection fraction is 55-65% by visual estimate.   Nonobstructive CAD Normal LV function Normal LVEDP 8 mm Hg   Plan: medical management.  Echocardiogram 7/29 IMPRESSIONS     1. Left ventricular ejection fraction, by estimation, is 60 to 65%. The  left ventricle has normal function. The left ventricle has no regional  wall motion abnormalities. Left ventricular diastolic parameters were  normal. The average left ventricular  global longitudinal strain is -23.6 %. The global longitudinal strain is  normal.   2. Right ventricular systolic function is normal. The right ventricular  size is normal.   3. Left atrial size was mildly dilated.   4. Right atrial size was mildly dilated.   5. The mitral valve is grossly normal. Trivial mitral valve  regurgitation. No evidence of mitral stenosis. There is mild late systolic  prolapse of the middle segment of the anterior leaflet of the mitral  valve.   6. The aortic valve was not well visualized. Aortic valve regurgitation  is not visualized. No aortic stenosis is present.   7. The inferior vena cava is dilated in size with <50% respiratory   variability, suggesting right atrial pressure of 15 mmHg.   _____________   History of Present Illness   Samuel Flynn is a 48 y.o. male with a past medical history of atrial fibrillation and atrial flutter s/p ablation in 2019 with subsequent recurrence, prior acute diastolic HF in setting of atrial arrhythmia otherwise without exacerbation, chronic sinus bradycardia not felt to require PPM, hypertension, and OSA uses CPAP who presented to the ED on 7/28 for chest pain and palpitations that woke him. In the ED he was in sinus bradycardia and had a troponin trend of 199-> 695. No ischemic changes noted on ECG. He was taken to the cath lab for NSTEMI.   Hospital Course    NSTEMI Paroxysmal atrial fibrillation and atrial flutter Awoke to chest pain and palpitations that were similar to his previous atrial fibrillation episodes. It lasted approximately 6 hours prior to presentation. He wore a heart monitor last year 05/2023 that showed episodes of SVT, low burden atrial fibrillation.  LHC showed mild nonobstructive coronary artery disease. He was recommended for medical therapy and started on asa and crestor . Given lack of significant coronary disease, suspected driven by possible uncaptured arrhythmia. Echo showed LVEF 60-65% with no RWMA. Mildly dilated RA and LA. Minimal valve disease. IVC noted to be expanded -> discussed with Dr. [place] at time of echo resulting, no further work up given asymptomatic and normal LVEDP during LHC.     Denied chest pain reoccurrence during admission. Per telemetry in sinus bradycardia with intermittent normal  sinus rhythm. During physical exam on day of discharge noted to go into a brief regular tachycardia that self terminated. He was not on telemetry during that time. By the time placed back on telemetry, was back in sinus bradycardia which is baseline. Concerned that an episode of atrial fibrillation RVR may have precipitated this admission. He has an EP  appointment scheduled for tomorrow for follow-up after stopping tikosyn  6 months ago due to patient preference. He may need to be reloaded or started on a new medication for rhythm control. Will defer starting medication or placing monitor at this time given his AF history and extensive work-up.  EP has historically deferred anticoagulation given low AF burden and low [place] Vasc score 2/2 to patient preference for low medication burden. Since no evidence of AF on telemetry and palpitation episode lasting < 7 hours prior to admission deferred starting anticoagulation at discharge.   Hemoglobin trended down [14.6 -> 11.9], reported fatigued during admission. At discharge a repeat H &H was obtained and was stable. His R cath site was without edema and ecchymosis. Radial pulse 2 +. Dressing clean and dry.   Continue ASA 81 mg, started this admission Continue crestor  20 mg, started this admission.  Deferred AV nodal blocking agents due to sinus bradycardia, which is known for patient EP outpatient follow-up tomorrow.   Risk stratification  Lipid panel LDL 37      HDL 42 Lipoprotein (a) pending  A1c 4.7%  Fatigue Sinus bradycardia OSA on CPAP Normocytic anemia Patient reported new fatigue during admission. Mild drop in  Hgb noted, repeat H/H stable. Recommend f/u PCP for ongoing management of anemia, not on any blood thinner therapy aside from ASA. No bleeding reported.   S/p gastric bypass Hypocalcemia Patient has history of hypocalcemia in the past recommended for calcium /vit D supplement. Recommend follow up with PCP and restarting calcium  supplement as outlined by GI team post gastric bypass.   General: Well developed, well nourished, male appearing in no acute distress. Head: Normocephalic, atraumatic.  Lungs:  Resp regular and unlabored, CTA. Heart: Sinus bradycardia with brief episode of regular, tachycardia, S1, S2, no S3, S4, or murmur; no rub. Extremities: No clubbing, cyanosis,  edema. Distal pedal pulses are 2+ bilaterally. R radial cath site stable without bruising or hematoma Neuro: Alert and oriented X 3. Moves all extremities spontaneously. Psych: Normal affect.  Patient examined by myself and by Dr. Jeffrie. They were deemed ready for discharge from a cardiac perspective. Medication education provided by pharmacy. Follow up appointment is arranged.       Did the patient have an acute coronary syndrome (MI, NSTEMI, STEMI, etc) this admission?:  Yes                               AHA/ACC ACS Clinical Performance & Quality Measures: Aspirin  prescribed? - Yes ADP Receptor Inhibitor (Plavix/Clopidogrel, Brilinta/Ticagrelor or Effient/Prasugrel) prescribed (includes medically managed patients)? - No - no indication given minimal disease Beta Blocker prescribed? - No - Sinus bradycardia at baseline High Intensity Statin (Lipitor 40-80mg  or Crestor  20-40mg ) prescribed? - Yes EF assessed during THIS hospitalization? - Yes For EF <40%, was ACEI/ARB prescribed? - Not Applicable (EF >/= 40%) For EF <40%, Aldosterone Antagonist (Spironolactone  or Eplerenone) prescribed? - Not Applicable (EF >/= 40%) Cardiac Rehab Phase II ordered (including medically managed patients)? - Yes         _____________  Discharge Vitals Blood pressure 130/78,  pulse (!) 41, temperature 97.8 F (36.6 C), temperature source Oral, resp. rate 20, height 6' 2 (1.88 m), weight 82.7 kg, SpO2 97%.  Filed Weights   03/31/24 1027 03/31/24 1857 04/01/24 0441  Weight: 81.6 kg 82.3 kg 82.7 kg    Labs & Radiologic Studies  CBC Recent Labs    03/31/24 1934 04/01/24 0228 04/01/24 0929  WBC 7.3 7.5  --   HGB 12.7* 11.9* 12.3*  HCT 36.3* 34.9* 36.1*  MCV 85.8 87.5  --   PLT 192 194  --    Basic Metabolic Panel Recent Labs    92/71/74 1039 03/31/24 1934  NA 140  --   K 4.2  --   CL 108  --   CO2 21*  --   GLUCOSE 86  --   BUN 10  --   CREATININE 1.16 0.97  CALCIUM  7.9*  --   MG 2.1   --    Liver Function Tests Recent Labs    03/31/24 1039  AST 20  ALT 12  ALKPHOS 111  BILITOT 1.3*  PROT 6.7  ALBUMIN 3.6   No results for input(s): LIPASE, AMYLASE in the last 72 hours. High Sensitivity Troponin:   Recent Labs  Lab 03/31/24 1039 03/31/24 1216  TROPONINIHS 199* 695*    No results for input(s): TRNPT in the last 720 hours.  BNP Invalid input(s): POCBNP No results for input(s): PROBNP in the last 72 hours.  No results for input(s): BNP in the last 72 hours.  D-Dimer No results for input(s): DDIMER in the last 72 hours. Hemoglobin A1C Recent Labs    04/01/24 0228  HGBA1C 4.7*   Fasting Lipid Panel Recent Labs    04/01/24 0228  CHOL 87  HDL 42  LDLCALC 37  TRIG 40  CHOLHDL 2.1   No results found for: LIPOA  Thyroid  Function Tests No results for input(s): TSH, T4TOTAL, T3FREE, THYROIDAB in the last 72 hours.  Invalid input(s): FREET3 _____________  ECHOCARDIOGRAM COMPLETE Result Date: 04/01/2024    ECHOCARDIOGRAM REPORT   Patient Name:   Samuel Flynn [facility] Date of Exam: 04/01/2024 Medical Rec #:  [id number]          Height:       74.0 in Accession #:    [id number]         Weight:       182.3 lb Date of Birth:  1976-03-05         BSA:          2.090 m Patient Age:    47 years           BP:           113/78 mmHg Patient Gender: M                  HR:           38 bpm. Exam Location:  Inpatient Procedure: 2D Echo, Cardiac Doppler, Color Doppler and Strain Analysis (Both            Spectral and Color Flow Doppler were utilized during procedure). Indications:    NSTEMI  History:        Patient has prior history of Echocardiogram examinations, most                 recent 04/16/2021. Arrythmias:Atrial Fibrillation; Risk                 Factors:Hypertension, Sleep Apnea and Current Smoker.  Sonographer:  Therisa Crouch Referring Phys: [phone] PETER M S[place] IMPRESSIONS  1. Left ventricular ejection fraction, by estimation, is 60 to 65%. The  left ventricle has normal function. The left ventricle has no regional wall motion abnormalities. Left ventricular diastolic parameters were normal. The average left ventricular global longitudinal strain is -23.6 %. The global longitudinal strain is normal.  2. Right ventricular systolic function is normal. The right ventricular size is normal.  3. Left atrial size was mildly dilated.  4. Right atrial size was mildly dilated.  5. The mitral valve is grossly normal. Trivial mitral valve regurgitation. No evidence of mitral stenosis. There is mild late systolic prolapse of the middle segment of the anterior leaflet of the mitral valve.  6. The aortic valve was not well visualized. Aortic valve regurgitation is not visualized. No aortic stenosis is present.  7. The inferior vena cava is dilated in size with <50% respiratory variability, suggesting right atrial pressure of 15 mmHg. Comparison(s): Prior images reviewed side by side. Left venitricular function is more dynamic, IVC is more plethoric. FINDINGS  Left Ventricle: Left ventricular ejection fraction, by estimation, is 60 to 65%. The left ventricle has normal function. The left ventricle has no regional wall motion abnormalities. The average left ventricular global longitudinal strain is -23.6 %. Strain was performed and the global longitudinal strain is normal. The left ventricular internal cavity size was normal in size. There is no left ventricular hypertrophy. Left ventricular diastolic parameters were normal. Right Ventricle: The right ventricular size is normal. No increase in right ventricular wall thickness. Right ventricular systolic function is normal. Left Atrium: Left atrial size was mildly dilated. Right Atrium: Right atrial size was mildly dilated. Pericardium: There is no evidence of pericardial effusion. Mitral Valve: The mitral valve is grossly normal. There is mild late systolic prolapse of the middle segment of the anterior leaflet of the  mitral valve. Trivial mitral valve regurgitation. No evidence of mitral valve stenosis. Tricuspid Valve: The tricuspid valve is normal in structure. Tricuspid valve regurgitation is mild . No evidence of tricuspid stenosis. Aortic Valve: The aortic valve was not well visualized. Aortic valve regurgitation is not visualized. No aortic stenosis is present. Aortic valve mean gradient measures 3.0 mmHg. Aortic valve peak gradient measures 5.2 mmHg. Aortic valve area, by VTI measures 3.78 cm. Pulmonic Valve: The pulmonic valve was not well visualized. Pulmonic valve regurgitation is not visualized. No evidence of pulmonic stenosis. Aorta: The aortic root is normal in size and structure and the ascending aorta was not well visualized. Venous: The inferior vena cava is dilated in size with less than 50% respiratory variability, suggesting right atrial pressure of 15 mmHg. IAS/Shunts: The atrial septum is grossly normal.  LEFT VENTRICLE PLAX 2D LVIDd:         5.60 cm   Diastology LVIDs:         3.70 cm   LV e' medial:    9.30 cm/s LV PW:         0.90 cm   LV E/e' medial:  8.6 LV IVS:        0.60 cm   LV e' lateral:   11.30 cm/s LVOT diam:     2.50 cm   LV E/e' lateral: 7.1 LV SV:         115 LV SV Index:   55        2D Longitudinal Strain LVOT Area:     4.91 cm  2D Strain GLS Avg:     -  23.6 %  RIGHT VENTRICLE             IVC RV Basal diam:  3.90 cm     IVC diam: 2.50 cm RV S prime:     10.90 cm/s TAPSE (M-mode): 2.3 cm LEFT ATRIUM             Index        RIGHT ATRIUM           Index LA diam:        4.60 cm 2.20 cm/m   RA Area:     23.60 cm LA Vol (A2C):   91.2 ml 43.64 ml/m  RA Volume:   77.20 ml  36.94 ml/m LA Vol (A4C):   71.8 ml 34.36 ml/m LA Biplane Vol: 85.9 ml 41.11 ml/m  AORTIC VALVE AV Area (Vmax):    3.78 cm AV Area (Vmean):   3.63 cm AV Area (VTI):     3.78 cm AV Vmax:           114.00 cm/s AV Vmean:          72.700 cm/s AV VTI:            0.304 m AV Peak Grad:      5.2 mmHg AV Mean Grad:      3.0 mmHg  LVOT Vmax:         87.80 cm/s LVOT Vmean:        53.800 cm/s LVOT VTI:          0.234 m LVOT/AV VTI ratio: 0.77  AORTA Ao Root diam: 3.30 cm MITRAL VALVE               TRICUSPID VALVE MV Area (PHT): 4.29 cm    TR Peak grad:   14.0 mmHg MV Decel Time: 177 msec    TR Vmax:        187.00 cm/s MV E velocity: 80.00 cm/s MV A velocity: 29.60 cm/s  SHUNTS MV E/A ratio:  2.70        Systemic VTI:  0.23 m                            Systemic Diam: 2.50 cm Samuel Leavens MD Electronically signed by Samuel Leavens MD Signature Date/Time: 04/01/2024/12:10:10 PM    Final    CARDIAC CATHETERIZATION Result Date: 03/31/2024   Mid LAD lesion is 30% stenosed.   Ost Cx to Prox Cx lesion is 20% stenosed.   The left ventricular systolic function is normal.   LV end diastolic pressure is normal.   The left ventricular ejection fraction is 55-65% by visual estimate. Nonobstructive CAD Normal LV function Normal LVEDP 8 mm Hg Plan: medical management.   DG Chest 2 View Result Date: 03/31/2024 CLINICAL DATA:  Palpitations and chest pain since this morning, history of atrial fibrillation EXAM: CHEST - 2 VIEW COMPARISON:  02/06/2024 FINDINGS: Frontal and lateral views of the chest demonstrate an unremarkable cardiac silhouette. Mild hyperinflation, without acute airspace disease, effusion, or pneumothorax. No acute bony abnormalities. IMPRESSION: 1. No acute intrathoracic process. Electronically Signed   By: Ozell Daring M.D.   On: 03/31/2024 13:43    Disposition Pt is being discharged home today in good condition.  Follow-up Plans & Appointments  Follow-up Information     [name], MD Follow up.   Specialty: Family Medicine Why: Your calcium  level and blood counts were somewhat reduced. This may be related to  your prior history of gastric bypass. Would recommend to schedule follow-up with PCP to recheck in 1-2 weeks. Contact information: [address] [place]  [address] [phone]         [facility] [employer] Follow up.   Specialty: Cardiology Why: Please keep follow-up as scheduled with electrophysiology PA Charlies Arthur tomorrow at 10:55 AM (Arrive by 10:35 AM).  We have also arranged general cardiology follow-up with nurse practitioner Josefa Beauvais on Thursday Apr 24, 2024 at 8:25 AM (Arrive by 8:05 AM). Contact information: [address] [place]  [address] [phone]               Discharge Instructions     Amb Referral to Cardiac Rehabilitation   Complete by: As directed    Diagnosis: NSTEMI   After initial evaluation and assessments completed: Virtual Based Care may be provided alone or in conjunction with Phase 2 Cardiac Rehab based on patient barriers.: Yes   Intensive Cardiac Rehabilitation (ICR) [facility] location only OR Traditional Cardiac Rehabilitation (TCR) *If criteria for ICR are not met will enroll in TCR [facility] only): Yes   Diet - low sodium heart healthy   Complete by: As directed    Discharge instructions   Complete by: As directed    Recommend following up with your PCP about your low calcium . Continue taking calcium  supplement as directed by your gastric bypass provider  Warren LITTIE Chambers, FNP  Please go to both cardiology visits scheduled as outlined on the AVS.  Radial Site Care Refer to this sheet in the next few weeks. These instructions provide you with information on caring for yourself after your procedure. Your caregiver may also give you more specific instructions. Your treatment has been planned according to current medical practices, but problems sometimes occur. Call your caregiver if you have any problems or questions after your procedure. HOME CARE INSTRUCTIONS You may shower the day after the procedure. Remove the bandage (dressing) and gently wash the site with plain soap and water. Gently pat the site dry.  Do not apply powder or lotion to the  site.  Do not submerge the affected site in water for 3 to 5 days.  Inspect the site at least twice daily.  Do not flex or bend the affected arm for 24 hours.  No lifting over 5 pounds (2.3 kg) for 5 days after your procedure.  Do not drive home if you are discharged the same day of the procedure. Have someone else drive you.  You may drive 24 hours after the procedure unless otherwise instructed by your caregiver.  What to expect: Any bruising will usually fade within 1 to 2 weeks.  Blood that collects in the tissue (hematoma) may be painful to the touch. It should usually decrease in size and tenderness within 1 to 2 weeks.  SEEK IMMEDIATE MEDICAL CARE IF: You have unusual pain at the radial site.  You have redness, warmth, swelling, or pain at the radial site.  You have drainage (other than a small amount of blood on the dressing).  You have chills.  You have a fever or persistent symptoms for more than 72 hours.  You have a fever and your symptoms suddenly get worse.  Your arm becomes pale, cool, tingly, or numb.  You have heavy bleeding from the site. Hold pressure on the site.   Increase activity slowly   Complete by: As directed  Discharge Medications Allergies as of 04/01/2024   Not on File      Medication List     STOP taking these medications    ibuprofen  200 MG tablet Commonly known as: ADVIL        TAKE these medications    aspirin  EC 81 MG tablet Take 1 tablet (81 mg total) by mouth daily. Swallow whole. Start taking on: April 02, 2024   OVER THE COUNTER MEDICATION Take 1 tablet by mouth daily. OTC gastric supplement   pantoprazole  40 MG tablet Commonly known as: PROTONIX  Take 1 tablet (40 mg total) by mouth daily.   rosuvastatin  20 MG tablet Commonly known as: CRESTOR  Take 1 tablet (20 mg total) by mouth daily.         Outstanding Labs/Studies Recommend repeat lipid panel/ LFTS in 6-8 weeks Recommend PCP to follow hypocalcemia and  anemia  Duration of Discharge Encounter: APP Time: 25 minutes   Signed, Leontine LOISE Salen, PA-C 04/01/2024, 1:28 PM   Personally seen and examined. Agree with above.  48 year old admitted with chest discomfort, had cardiac catheterization that showed no evidence of thrombosis.  Elevated troponin in the 800 range.  Diagnosis is type II myocardial infarction likely related to potential A-fib with RVR prior to hospitalization.  He felt palpitations previously.  Several months ago decided to discontinue Tikosyn  as well as anticoagulation.  Has had 2 atrial fibrillation ablations 2019 and 2021.   Has close follow-up with electrophysiology tomorrow.  Charlies Arthur.   Continue Crestor  20 mg for soft plaque stabilization.  ECHO normal EF  Time: 25 min on DC   Oneil Parchment, MD

## 2024-04-01 NOTE — TOC CM/SW Note (Signed)
 Transition of Care [facility]) - Inpatient Brief Assessment   Patient Details  Name: Samuel Flynn MRN: [id number] Date of Birth: [birth year]  Transition of Care [facility]) CM/SW Contact:    Waddell Barnie Rama, RN Phone Number: 04/01/2024, 10:31 AM   Clinical Narrative: From home with spouse, has PCP and insurance on file, states has no HH services in place at this time or DME at home.  States family member will transport them home at [employer] and family is support system, states gets medications from [facility] [address].  Pta self ambulatory.      Transition of Care Asessment: Insurance and Status: Insurance coverage has been reviewed Patient has primary care physician: Yes Home environment has been reviewed: home with wife Prior level of function:: indep Prior/Current Home Services: No current home services Social Drivers of Health Review: SDOH reviewed no interventions necessary Readmission risk has been reviewed: Yes Transition of care needs: no transition of care needs at this time

## 2024-04-01 NOTE — Plan of Care (Signed)
  Problem: Education: Goal: Understanding of cardiac disease, CV risk reduction, and recovery process will improve 04/01/2024 0856 by Gail Cathryne SAILOR, RN Outcome: Progressing 04/01/2024 0856 by Gail Cathryne SAILOR, RN Outcome: Adequate for Discharge

## 2024-04-01 NOTE — TOC Transition Note (Signed)
 Transition of Care [facility]) - Discharge Note   Patient Details  Name: Samuel Flynn MRN: [id number] Date of Birth: [birth year]  Transition of Care [facility]) CM/SW Contact:  Waddell Barnie Rama, RN Phone Number: 04/01/2024, 10:32 AM   Clinical Narrative:    For dc today, has no needs. Wife at bedside to transport.          Patient Goals and CMS Choice            Discharge Placement                       Discharge Plan and Services Additional resources added to the After Visit Summary for                                       Social Drivers of Health (SDOH) Interventions SDOH Screenings   Food Insecurity: No Food Insecurity (03/31/2024)  Housing: Low Risk  (03/31/2024)  Transportation Needs: No Transportation Needs (03/31/2024)  Utilities: Not At Risk (03/31/2024)  Alcohol Screen: Low Risk  (02/06/2024)  Depression (PHQ2-9): Low Risk  (03/16/2022)  Financial Resource Strain: Low Risk  (02/06/2024)  Physical Activity: Unknown (02/06/2024)  Social Connections: Moderately Isolated (02/06/2024)  Stress: Stress Concern Present (02/06/2024)  Tobacco Use: Medium Risk (03/31/2024)     Readmission Risk Interventions    04/01/2024   10:30 AM  Readmission Risk Prevention Plan  Post Dischage Appt Complete  Medication Screening Complete  Transportation Screening Complete

## 2024-04-01 NOTE — Care Management Obs Status (Deleted)
 MEDICARE OBSERVATION STATUS NOTIFICATION   Patient Details  Name: Samuel Flynn MRN: [id number] Date of Birth: 04-15-1976   Medicare Observation Status Notification Given:  Yes    Waddell Barnie Rama, RN 04/01/2024, 10:37 AM

## 2024-04-01 NOTE — TOC Progression Note (Signed)
 Transition of Care [facility]) - Progression Note    Patient Details  Name: Samuel Flynn MRN: [id number] Date of Birth: 12/19/1975  Transition of Care [facility]) CM/SW Contact  Waddell Barnie Rama, RN Phone Number: 04/01/2024, 10:53 AM  Clinical Narrative:    NCM just got message that decision has been changed to IP, and to revoke the code 44, from Ronal KATHEE Daring.                       Expected Discharge Plan and Services                                               Social Drivers of Health (SDOH) Interventions SDOH Screenings   Food Insecurity: No Food Insecurity (03/31/2024)  Housing: Low Risk  (03/31/2024)  Transportation Needs: No Transportation Needs (03/31/2024)  Utilities: Not At Risk (03/31/2024)  Alcohol Screen: Low Risk  (02/06/2024)  Depression (PHQ2-9): Low Risk  (03/16/2022)  Financial Resource Strain: Low Risk  (02/06/2024)  Physical Activity: Unknown (02/06/2024)  Social Connections: Moderately Isolated (02/06/2024)  Stress: Stress Concern Present (02/06/2024)  Tobacco Use: Medium Risk (03/31/2024)    Readmission Risk Interventions    04/01/2024   10:30 AM  Readmission Risk Prevention Plan  Post Dischage Appt Complete  Medication Screening Complete  Transportation Screening Complete

## 2024-04-01 NOTE — Care Management CC44 (Deleted)
 Condition Code 44 Documentation Completed  Patient Details  Name: TERRE ZABRISKIE MRN: [id number] Date of Birth: 05/04/1976   Condition Code 44 given:  Yes Patient signature on Condition Code 44 notice:  Yes Documentation of 2 MD's agreement:  Yes Code 44 added to claim:  Yes    Waddell Barnie Rama, RN 04/01/2024, 10:38 AM

## 2024-04-01 NOTE — Plan of Care (Signed)
 Problem: Education: Goal: Understanding of cardiac disease, CV risk reduction, and recovery process will improve 04/01/2024 0856 by Gail Cathryne SAILOR, RN Outcome: Adequate for Discharge 04/01/2024 0856 by Gail Cathryne SAILOR, RN Outcome: Progressing 04/01/2024 0856 by Gail Cathryne SAILOR, RN Outcome: Adequate for Discharge Goal: Individualized Educational Video(s) 04/01/2024 0856 by Gail Cathryne SAILOR, RN Outcome: Adequate for Discharge 04/01/2024 0856 by Gail Cathryne SAILOR, RN Outcome: Adequate for Discharge   Problem: Activity: Goal: Ability to tolerate increased activity will improve 04/01/2024 0856 by Gail Cathryne SAILOR, RN Outcome: Adequate for Discharge 04/01/2024 [phone] by Gail Cathryne SAILOR, RN Outcome: Adequate for Discharge   Problem: Cardiac: Goal: Ability to achieve and maintain adequate cardiovascular perfusion will improve 04/01/2024 0856 by Gail Cathryne SAILOR, RN Outcome: Adequate for Discharge 04/01/2024 0856 by Gail Cathryne SAILOR, RN Outcome: Adequate for Discharge   Problem: Health Behavior/Discharge Planning: Goal: Ability to safely manage health-related needs after discharge will improve 04/01/2024 0856 by Gail Cathryne SAILOR, RN Outcome: Adequate for Discharge 04/01/2024 0856 by Gail Cathryne SAILOR, RN Outcome: Adequate for Discharge   Problem: Education: Goal: Knowledge of General Education information will improve Description: Including pain rating scale, medication(s)/side effects and non-pharmacologic comfort measures 04/01/2024 0856 by Gail Cathryne SAILOR, RN Outcome: Adequate for Discharge 04/01/2024 0856 by Gail Cathryne SAILOR, RN Outcome: Adequate for Discharge   Problem: Health Behavior/Discharge Planning: Goal: Ability to manage health-related needs will improve 04/01/2024 0856 by Gail Cathryne SAILOR, RN Outcome: Adequate for Discharge 04/01/2024 0856 by Gail Cathryne SAILOR, RN Outcome: Adequate for Discharge   Problem: Clinical Measurements: Goal: Ability to maintain clinical  measurements within normal limits will improve 04/01/2024 0856 by Gail Cathryne SAILOR, RN Outcome: Adequate for Discharge 04/01/2024 0856 by Gail Cathryne SAILOR, RN Outcome: Adequate for Discharge Goal: Will remain free from infection 04/01/2024 0856 by Gail Cathryne SAILOR, RN Outcome: Adequate for Discharge 04/01/2024 0856 by Gail Cathryne SAILOR, RN Outcome: Adequate for Discharge Goal: Diagnostic test results will improve 04/01/2024 0856 by Gail Cathryne SAILOR, RN Outcome: Adequate for Discharge 04/01/2024 0856 by Gail Cathryne SAILOR, RN Outcome: Adequate for Discharge Goal: Respiratory complications will improve 04/01/2024 0856 by Gail Cathryne SAILOR, RN Outcome: Adequate for Discharge 04/01/2024 0856 by Gail Cathryne SAILOR, RN Outcome: Adequate for Discharge Goal: Cardiovascular complication will be avoided 04/01/2024 0856 by Gail Cathryne SAILOR, RN Outcome: Adequate for Discharge 04/01/2024 0856 by Gail Cathryne SAILOR, RN Outcome: Adequate for Discharge   Problem: Activity: Goal: Risk for activity intolerance will decrease 04/01/2024 0856 by Gail Cathryne SAILOR, RN Outcome: Adequate for Discharge 04/01/2024 [phone] by Gail Cathryne SAILOR, RN Outcome: Adequate for Discharge   Problem: Nutrition: Goal: Adequate nutrition will be maintained 04/01/2024 0856 by Gail Cathryne SAILOR, RN Outcome: Adequate for Discharge 04/01/2024 0856 by Gail Cathryne SAILOR, RN Outcome: Adequate for Discharge   Problem: Coping: Goal: Level of anxiety will decrease 04/01/2024 0856 by Gail Cathryne SAILOR, RN Outcome: Adequate for Discharge 04/01/2024 0856 by Gail Cathryne SAILOR, RN Outcome: Adequate for Discharge   Problem: Elimination: Goal: Will not experience complications related to bowel motility 04/01/2024 0856 by Gail Cathryne SAILOR, RN Outcome: Adequate for Discharge 04/01/2024 0856 by Gail Cathryne SAILOR, RN Outcome: Adequate for Discharge Goal: Will not experience complications related to urinary retention 04/01/2024 0856 by Gail Cathryne SAILOR,  RN Outcome: Adequate for Discharge 04/01/2024 0856 by Gail Cathryne SAILOR, RN Outcome: Adequate for Discharge   Problem: Pain Managment: Goal: General experience of comfort will improve and/or be controlled 04/01/2024 0856 by Gail Cathryne SAILOR, RN Outcome:  Adequate for Discharge 04/01/2024 0856 by Gail Cathryne SAILOR, RN Outcome: Adequate for Discharge   Problem: Safety: Goal: Ability to remain free from injury will improve 04/01/2024 0856 by Gail Cathryne SAILOR, RN Outcome: Adequate for Discharge 04/01/2024 0856 by Gail Cathryne SAILOR, RN Outcome: Adequate for Discharge   Problem: Skin Integrity: Goal: Risk for impaired skin integrity will decrease 04/01/2024 0856 by Gail Cathryne SAILOR, RN Outcome: Adequate for Discharge 04/01/2024 0856 by Gail Cathryne SAILOR, RN Outcome: Adequate for Discharge   Problem: Education: Goal: Understanding of CV disease, CV risk reduction, and recovery process will improve 04/01/2024 0856 by Gail Cathryne SAILOR, RN Outcome: Adequate for Discharge 04/01/2024 0856 by Gail Cathryne SAILOR, RN Outcome: Adequate for Discharge Goal: Individualized Educational Video(s) 04/01/2024 0856 by Gail Cathryne SAILOR, RN Outcome: Adequate for Discharge 04/01/2024 0856 by Gail Cathryne SAILOR, RN Outcome: Adequate for Discharge   Problem: Activity: Goal: Ability to return to baseline activity level will improve 04/01/2024 0856 by Gail Cathryne SAILOR, RN Outcome: Adequate for Discharge 04/01/2024 [phone] by Gail Cathryne SAILOR, RN Outcome: Adequate for Discharge   Problem: Cardiovascular: Goal: Ability to achieve and maintain adequate cardiovascular perfusion will improve 04/01/2024 0856 by Gail Cathryne SAILOR, RN Outcome: Adequate for Discharge 04/01/2024 0856 by Gail Cathryne SAILOR, RN Outcome: Adequate for Discharge Goal: Vascular access site(s) Level 0-1 will be maintained 04/01/2024 0856 by Gail Cathryne SAILOR, RN Outcome: Adequate for Discharge 04/01/2024 0856 by Gail Cathryne SAILOR, RN Outcome: Adequate for  Discharge   Problem: Health Behavior/Discharge Planning: Goal: Ability to safely manage health-related needs after discharge will improve 04/01/2024 0856 by Gail Cathryne SAILOR, RN Outcome: Adequate for Discharge 04/01/2024 0856 by Gail Cathryne SAILOR, RN Outcome: Adequate for Discharge

## 2024-04-01 NOTE — Progress Notes (Signed)
 Reviewed AVS, patient expressed understanding of medications, MD follow up reviewed.   Removed IV, Site clean, dry and intact.  Patient states all belongings brought to the hospital at time of admission are accounted for and packed to take home.  Picked up medications from [facility] pharmacy. Pt transported to entrance A where family member was waiting in vehicle to transport home.

## 2024-04-02 ENCOUNTER — Encounter: Payer: Self-pay | Admitting: Physician Assistant

## 2024-04-02 ENCOUNTER — Ambulatory Visit: Attending: Cardiology | Admitting: Physician Assistant

## 2024-04-02 ENCOUNTER — Telehealth: Payer: Self-pay

## 2024-04-02 VITALS — BP 122/70 | HR 74 | Ht 74.0 in | Wt 184.1 lb

## 2024-04-02 DIAGNOSIS — I480 Paroxysmal atrial fibrillation: Secondary | ICD-10-CM | POA: Insufficient documentation

## 2024-04-02 DIAGNOSIS — I10 Essential (primary) hypertension: Secondary | ICD-10-CM | POA: Insufficient documentation

## 2024-04-02 DIAGNOSIS — R001 Bradycardia, unspecified: Secondary | ICD-10-CM | POA: Diagnosis present

## 2024-04-02 LAB — CALCIUM, IONIZED: Calcium, Ionized, Serum: 4.6 mg/dL (ref 4.5–5.6)

## 2024-04-02 LAB — LIPOPROTEIN A (LPA): Lipoprotein (a): 45.4 nmol/L — ABNORMAL HIGH (ref <75.0)

## 2024-04-02 NOTE — Patient Instructions (Addendum)
 Medication Instructions:   Your physician recommends that you continue on your current medications as directed. Please refer to the Current Medication list given to you today.   *If you need a refill on your cardiac medications before your next appointment, please call your pharmacy*    Lab Work: NONE ORDERED  TODAY     If you have labs (blood work) drawn today and your tests are completely normal, you will receive your results only by: MyChart Message (if you have MyChart) OR A paper copy in the mail If you have any lab test that is abnormal or we need to change your treatment, we will call you to review the results.    Testing/Procedures: NONE ORDERED  TODAY      Follow-Up: At [facility], you and your health needs are our priority.  As part of our continuing mission to provide you with exceptional heart care, our providers are all part of one team.  This team includes your primary Cardiologist (physician) and Advanced Practice Providers or APPs (Physician Assistants and Nurse Practitioners) who all work together to provide you with the care you need, when you need it.   Your next appointment:    2 month(s)  Provider:   Soyla Norton, MD  ONLY!!! ( CONTACT  CASSIE HALL/ ANGELINE HAMMER FOR EP SCHEDULING ISSUES )     We recommend signing up for the patient portal called MyChart.  Sign up information is provided on this After Visit Summary.  MyChart is used to connect with patients for Virtual Visits (Telemedicine).  Patients are able to view lab/test results, encounter notes, upcoming appointments, etc.  Non-urgent messages can be sent to your provider as well.   To learn more about what you can do with MyChart, go to [email or web address].   Other Instructions

## 2024-04-02 NOTE — Transitions of Care (Post Inpatient/ED Visit) (Signed)
   04/02/2024  Name: Samuel Flynn MRN: [id number] DOB: 06-20-1976  Today's TOC FU Call Status: Today's TOC FU Call Status:: Successful TOC FU Call Completed TOC FU Call Complete Date: 04/02/24 Patient's Name and Date of Birth confirmed.  Transition Care Management Follow-up Telephone Call Date of Discharge: 04/01/24 Discharge Facility: [facility]) Type of Discharge: Inpatient Admission Primary Inpatient Discharge Diagnosis:: NSTEMI How have you been since you were released from the hospital?: Better Any questions or concerns?: No  Items Reviewed: Did you receive and understand the discharge instructions provided?: Yes Medications obtained,verified, and reconciled?: Yes (Medications Reviewed) Any new allergies since your discharge?: No Dietary orders reviewed?: Yes Do you have support at home?: Yes People in Home [RPT]: spouse  Medications Reviewed Today: Medications Reviewed Today     Reviewed by Emmitt Pan, LPN (Licensed Practical Nurse) on 04/02/24 at 1218  Med List Status: <None>   Medication Order Taking? Sig Documenting Provider Last Dose Status Informant  aspirin  EC 81 MG tablet [id number] Yes Take 1 tablet (81 mg total) by mouth daily. Swallow whole. Garrick Leontine SAILOR, PA-C  Active   pantoprazole  (PROTONIX ) 40 MG tablet [id number] Yes Take 1 tablet (40 mg total) by mouth daily. Lorren Greig PARAS, NP  Active Self, Pharmacy Records  rosuvastatin  (CRESTOR ) 20 MG tablet [id number] Yes Take 1 tablet (20 mg total) by mouth daily. Garrick Leontine SAILOR, PA-C  Active             Home Care and Equipment/Supplies: Were Home Health Services Ordered?: NA Any new equipment or medical supplies ordered?: NA  Functional Questionnaire: Do you need assistance with bathing/showering or dressing?: No Do you need assistance with meal preparation?: No Do you need assistance with eating?: No Do you have difficulty maintaining continence: No Do you need assistance with getting  out of bed/getting out of a chair/moving?: No Do you have difficulty managing or taking your medications?: No  Follow up appointments reviewed: PCP Follow-up appointment confirmed?: Yes Date of PCP follow-up appointment?: 04/07/24 Follow-up Provider: [facility] Follow-up appointment confirmed?: Yes Date of Specialist follow-up appointment?: 04/02/24 Follow-Up Specialty Provider:: cardio Do you need transportation to your follow-up appointment?: No Do you understand care options if your condition(s) worsen?: Yes-patient verbalized understanding    SIGNATURE Pan Emmitt, LPN [facility] Nurse Health Advisor Direct Dial [phone]

## 2024-04-03 ENCOUNTER — Telehealth: Payer: Self-pay | Admitting: Physician Assistant

## 2024-04-03 NOTE — Telephone Encounter (Signed)
 Called the patient to let him know Dr. Inocencio felt was reasonable to pursue another ablation. Left a message letting him know Dr. Inocencio had reviewed case, to call back to discuss further, move towards procedure I have sent a message to the EP schedulers to call the patient as well And a chat message to my MA to follow up with the patient.  Charlies Arthur, PA-C

## 2024-04-04 ENCOUNTER — Encounter: Payer: Self-pay | Admitting: Physician Assistant

## 2024-04-07 ENCOUNTER — Telehealth: Payer: Self-pay | Admitting: *Deleted

## 2024-04-07 ENCOUNTER — Encounter: Payer: Self-pay | Admitting: Family Medicine

## 2024-04-07 ENCOUNTER — Ambulatory Visit (INDEPENDENT_AMBULATORY_CARE_PROVIDER_SITE_OTHER): Admitting: Family Medicine

## 2024-04-07 VITALS — BP 136/82 | HR 44 | Ht 74.0 in | Wt 176.8 lb

## 2024-04-07 DIAGNOSIS — I252 Old myocardial infarction: Secondary | ICD-10-CM

## 2024-04-07 DIAGNOSIS — Z09 Encounter for follow-up examination after completed treatment for conditions other than malignant neoplasm: Secondary | ICD-10-CM | POA: Diagnosis not present

## 2024-04-07 DIAGNOSIS — I4891 Unspecified atrial fibrillation: Secondary | ICD-10-CM

## 2024-04-07 DIAGNOSIS — L989 Disorder of the skin and subcutaneous tissue, unspecified: Secondary | ICD-10-CM

## 2024-04-07 MED ORDER — RIVAROXABAN 20 MG PO TABS: 20.0000 mg | ORAL_TABLET | Freq: Every day | ORAL | 2 refills | Status: AC

## 2024-04-07 MED ORDER — DOXYCYCLINE HYCLATE 100 MG PO TABS: 100.0000 mg | ORAL_TABLET | Freq: Two times a day (BID) | ORAL | 0 refills | Status: DC

## 2024-04-07 NOTE — Progress Notes (Unsigned)
 Established Patient Office Visit  Subjective    Patient ID: Samuel Flynn, male    DOB: Jan 28, 1976  Age: 48 y.o. MRN: [id number]  CC:  Chief Complaint  Patient presents with   Hospitalization Follow-up    Pt reports no concerns but a few questions for Dr.     ODETTA Vinie LELON Flynn presents for routine hospital discharge follow up. Patient was seen for NSTEMI. 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  Outpatient Encounter Medications as of 04/07/2024  Medication Sig   aspirin  EC 81 MG tablet Take 1 tablet (81 mg total) by mouth daily. Swallow whole.   pantoprazole  (PROTONIX ) 40 MG tablet Take 1 tablet (40 mg total) by mouth daily.   rosuvastatin  (CRESTOR ) 20 MG tablet Take 1 tablet (20 mg total) by mouth daily.   No facility-administered encounter medications on file as of 04/07/2024.    Past Medical History:  Diagnosis Date   Atrial fibrillation with RVR (HCC)    Gastric ulcer    Gout    Hypertension    Paroxysmal A-fib (HCC)    Sleep apnea    USES CPAP   Wears glasses     Past Surgical History:  Procedure Laterality Date   ATRIAL FIBRILLATION ABLATION N/A 04/26/2018   Procedure: ATRIAL FIBRILLATION ABLATION;  Surgeon: Inocencio Soyla Lunger, MD;  Location: [facility] INVASIVE CV LAB;  Service: Cardiovascular;  Laterality: N/A;   ATRIAL FIBRILLATION ABLATION N/A 11/07/2019   Procedure: ATRIAL FIBRILLATION ABLATION;  Surgeon: Inocencio Soyla Lunger, MD;  Location: [facility] INVASIVE CV LAB;  Service: Cardiovascular;  Laterality: N/A;   CARDIOVERSION N/A 01/18/2018   Procedure: CARDIOVERSION;  Surgeon: Rolan Ezra RAMAN, MD;  Location: [facility] ENDOSCOPY;  Service: Cardiovascular;  Laterality: N/A;   CARDIOVERSION N/A 08/19/2019   Procedure: CARDIOVERSION;  Surgeon: Alveta Aleene PARAS, MD;  Location: [facility] ENDOSCOPY;  Service: Cardiovascular;  Laterality: N/A;   CARDIOVERSION N/A 04/18/2021   Procedure: CARDIOVERSION;  Surgeon:  Alveta Aleene PARAS, MD;  Location: [facility] ENDOSCOPY;  Service: Cardiovascular;  Laterality: N/A;   ELBOW LIGAMENT RECONSTRUCTION Right 09/29/2014   Procedure: HECTOR FRACTURE FIXATION, POSSIBLE LIGAMENT REPAIR;  Surgeon: Eva Elsie Herring, MD;  Location: [facility];  Service: Orthopedics;  Laterality: Right;  Right elbow coranoid fracture fixation, possible ligament repair   LEFT HEART CATH AND CORONARY ANGIOGRAPHY N/A 03/31/2024   Procedure: LEFT HEART CATH AND CORONARY ANGIOGRAPHY;  Surgeon: Swaziland, Peter M, MD;  Location: [facility] INVASIVE CV LAB;  Service: Cardiovascular;  Laterality: N/A;   TEE WITHOUT CARDIOVERSION N/A 01/18/2018   Procedure: TRANSESOPHAGEAL ECHOCARDIOGRAM (TEE);  Surgeon: Rolan Ezra RAMAN, MD;  Location: [facility] ENDOSCOPY;  Service: Cardiovascular;  Laterality: N/A;   TEE WITHOUT CARDIOVERSION N/A 04/18/2021   Procedure: TRANSESOPHAGEAL ECHOCARDIOGRAM (TEE);  Surgeon: Alveta Aleene PARAS, MD;  Location: [facility] ENDOSCOPY;  Service: Cardiovascular;  Laterality: N/A;   WISDOM TOOTH EXTRACTION      Family History  Problem Relation Age of Onset   Hypertension Paternal Uncle    Hypertension Maternal Grandmother    Hypertension Paternal Uncle    Hypertension Paternal Uncle    Hypertension Paternal Uncle    Hypertension Paternal Uncle    Hypertension Mother    Stroke Father    Heart disease Father    Heart attack Father    Hypertension Father    Colon cancer Neg Hx     Social History   Socioeconomic History   Marital  status: Married    Spouse name: Not on file   Number of children: 4   Years of education: 10   Highest education level: 8th grade  Occupational History   Occupation: Truck Air traffic controller: [employer]  Tobacco Use   Smoking status: Former    Types: Cigars    Quit date: 07/20/2021    Years since quitting: 2.7   Smokeless tobacco: Never   Tobacco comments:    Former smoker 08/16/21  Vaping Use   Vaping status: Never Used  Substance and  Sexual Activity   Alcohol use: Not Currently    Comment: occ   Drug use: No   Sexual activity: Yes    Comment: 5 a day  Other Topics Concern   Not on file  Social History Narrative   Not on file   Social Drivers of Health   Financial Resource Strain: Low Risk  (02/06/2024)   Overall Financial Resource Strain (CARDIA)    Difficulty of Paying Living Expenses: Not hard at all  Food Insecurity: No Food Insecurity (03/31/2024)   Hunger Vital Sign    Worried About Running Out of Food in the Last Year: Never true    Ran Out of Food in the Last Year: Never true  Transportation Needs: No Transportation Needs (03/31/2024)   PRAPARE - Administrator, Civil Service (Medical): No    Lack of Transportation (Non-Medical): No  Physical Activity: Unknown (02/06/2024)   Exercise Vital Sign    Days of Exercise per Week: 0 days    Minutes of Exercise per Session: Not on file  Stress: Stress Concern Present (02/06/2024)   [employer] of Occupational Health - Occupational Stress Questionnaire    Feeling of Stress : Very much  Social Connections: Moderately Isolated (02/06/2024)   Social Connection and Isolation Panel    Frequency of Communication with Friends and Family: More than three times a week    Frequency of Social Gatherings with Friends and Family: More than three times a week    Attends Religious Services: Never    Database administrator or Organizations: No    Attends Engineer, structural: Not on file    Marital Status: Married  Catering manager Violence: Not At Risk (03/31/2024)   Humiliation, Afraid, Rape, and Kick questionnaire    Fear of Current or Ex-Partner: No    Emotionally Abused: No    Physically Abused: No    Sexually Abused: No    Review of Systems  All other systems reviewed and are negative.       Objective    BP 136/82   Pulse (!) 44   Ht 6' 2 (1.88 m)   Wt 176 lb 12.8 oz (80.2 kg)   SpO2 98%   BMI 22.70 kg/m   Physical Exam Vitals  and nursing note reviewed.  Constitutional:      General: He is not in acute distress. Cardiovascular:     Rate and Rhythm: Normal rate and regular rhythm.  Pulmonary:     Effort: Pulmonary effort is normal.     Breath sounds: Normal breath sounds.  Abdominal:     Palpations: Abdomen is soft.     Tenderness: There is no abdominal tenderness.  Skin:    Findings: Lesion (lower extremities) present.  Neurological:     General: No focal deficit present.     Mental Status: He is alert and oriented to person, place, and time.  Assessment & Plan:   1. Skin lesions (Primary) Doxy prescribed  2. History of non-ST elevation myocardial infarction (NSTEMI) Management as per consultant.   3. Atrial fibrillation, unspecified type [facility]) Per consultant. Patient scheduled for ablation  4. Hospital discharge follow-up    No follow-ups on file.   Tanda Raguel SQUIBB, MD

## 2024-04-07 NOTE — Telephone Encounter (Signed)
 Spoke with patient aware of recommendations and changes with medicines and appointment changes.

## 2024-04-08 ENCOUNTER — Telehealth (HOSPITAL_COMMUNITY): Payer: Self-pay

## 2024-04-08 NOTE — Telephone Encounter (Signed)
Pt is not interested in the cardiac rehab at this time. ?Closed referral. ?

## 2024-04-09 ENCOUNTER — Encounter: Payer: Self-pay | Admitting: Family Medicine

## 2024-04-21 NOTE — Progress Notes (Unsigned)
  Electrophysiology Office Note:   Date:  04/22/2024  ID:  Samuel Flynn, DOB 1976-08-25, MRN [id number]  Primary Cardiologist: Annabella Scarce, MD Primary Heart Failure: None Electrophysiologist: Getsemani Lindon Gladis Norton, MD      History of Present Illness:   Samuel Flynn is a 48 y.o. male with h/o atrial fibrillation, hypertension, obesity seen today for routine electrophysiology followup.   He has had atrial fibrillation x 2.  He presented to the hospital 03/31/2024 with palpitations and chest discomfort.  He arrived to the hospital in sinus rhythm.  He was presumed to have rapid atrial fibrillation at the time of presentation.  Elevated troponin was thought due to rapid atrial fibrillation his catheterization showed no obstructive disease.  Since last being seen in our clinic the patient reports doing overall well.  He has not had any further atrial fibrillation since his hospitalization in July.  Like this, he would prefer rhythm control.  he denies chest pain, palpitations, dyspnea, PND, orthopnea, nausea, vomiting, dizziness, syncope, edema, weight gain, or early satiety.   Review of systems complete and found to be negative unless listed in HPI.   EP Information / Studies Reviewed:    EKG is not ordered today. EKG from 03/31/2024 reviewed which showed sinus rhythm      Risk Assessment/Calculations:    CHA2DS2-VASc Score =     This indicates a  % annual risk of stroke. The patient's score is based upon:             Physical Exam:   VS:  BP 126/76 (BP Location: Left Arm, Patient Position: Sitting, Cuff Size: Large)   Pulse (!) 42   Ht 6' 2 (1.88 m)   Wt 188 lb (85.3 kg)   SpO2 100%   BMI 24.14 kg/m    Wt Readings from Last 3 Encounters:  04/22/24 188 lb (85.3 kg)  04/07/24 176 lb 12.8 oz (80.2 kg)  04/02/24 184 lb 1.6 oz (83.5 kg)     GEN: Well nourished, well developed in no acute distress NECK: No JVD; No carotid bruits CARDIAC: Regular rate and rhythm,  no murmurs, rubs, gallops RESPIRATORY:  Clear to auscultation without rales, wheezing or rhonchi  ABDOMEN: Soft, non-tender, non-distended EXTREMITIES:  No edema; No deformity   ASSESSMENT AND PLAN:    1.  Paroxysmal atrial fibrillation/flutter: Currently not anticoagulated.  He is unfortunately continued to have episodes of atrial fibrillation.  He would prefer rhythm control.  Would like to avoid long-term antiarrhythmics.  Due to that we Daphnee Preiss plan for ablation. Risk, benefits, and alternatives to EP study and radiofrequency/pulse field ablation for afib were also discussed in detail today. These risks include but are not limited to stroke, bleeding, vascular damage, tamponade, perforation, damage to the esophagus, lungs, and other structures, pulmonary vein stenosis, worsening renal function, and death. The patient understands these risk and wishes to proceed.  We Milliana Reddoch therefore proceed with catheter ablation at the next available time.  Carto, ICE, anesthesia are requested for the procedure.  This patient Velta Rockholt require CT prior to ablation. To be scheduled.   2.  Hypertension: Well-controlled   Follow up with Afib Clinic as usual post procedure  Signed, Azaylah Stailey Gladis Norton, MD

## 2024-04-22 ENCOUNTER — Encounter: Payer: Self-pay | Admitting: Cardiology

## 2024-04-22 ENCOUNTER — Ambulatory Visit: Attending: Cardiology | Admitting: Cardiology

## 2024-04-22 VITALS — BP 126/76 | HR 42 | Ht 74.0 in | Wt 188.0 lb

## 2024-04-22 DIAGNOSIS — I10 Essential (primary) hypertension: Secondary | ICD-10-CM | POA: Diagnosis present

## 2024-04-22 DIAGNOSIS — I480 Paroxysmal atrial fibrillation: Secondary | ICD-10-CM | POA: Diagnosis present

## 2024-04-22 DIAGNOSIS — Z01812 Encounter for preprocedural laboratory examination: Secondary | ICD-10-CM | POA: Insufficient documentation

## 2024-04-22 DIAGNOSIS — I483 Typical atrial flutter: Secondary | ICD-10-CM | POA: Insufficient documentation

## 2024-04-22 NOTE — Progress Notes (Deleted)
 Cardiology Clinic Note   Patient Name: Samuel Flynn Date of Encounter: 04/22/2024  Primary Care Provider:  [name], MD Primary Cardiologist:  Annabella Scarce, MD  Patient Profile    Samuel Flynn 48 year old male presents to the clinic today for posthospital follow-up evaluation of his chest discomfort and palpitations.  Past Medical History    Past Medical History:  Diagnosis Date   Atrial fibrillation with RVR (HCC)    Gastric ulcer    Gout    Hypertension    Paroxysmal A-fib (HCC)    Sleep apnea    USES CPAP   Wears glasses    Past Surgical History:  Procedure Laterality Date   ATRIAL FIBRILLATION ABLATION N/A 04/26/2018   Procedure: ATRIAL FIBRILLATION ABLATION;  Surgeon: Inocencio Soyla Lunger, MD;  Location: [facility] INVASIVE CV LAB;  Service: Cardiovascular;  Laterality: N/A;   ATRIAL FIBRILLATION ABLATION N/A 11/07/2019   Procedure: ATRIAL FIBRILLATION ABLATION;  Surgeon: Inocencio Soyla Lunger, MD;  Location: [facility] INVASIVE CV LAB;  Service: Cardiovascular;  Laterality: N/A;   CARDIOVERSION N/A 01/18/2018   Procedure: CARDIOVERSION;  Surgeon: Rolan Ezra RAMAN, MD;  Location: [facility] ENDOSCOPY;  Service: Cardiovascular;  Laterality: N/A;   CARDIOVERSION N/A 08/19/2019   Procedure: CARDIOVERSION;  Surgeon: Alveta Aleene PARAS, MD;  Location: [facility] ENDOSCOPY;  Service: Cardiovascular;  Laterality: N/A;   CARDIOVERSION N/A 04/18/2021   Procedure: CARDIOVERSION;  Surgeon: Alveta Aleene PARAS, MD;  Location: [facility] ENDOSCOPY;  Service: Cardiovascular;  Laterality: N/A;   ELBOW LIGAMENT RECONSTRUCTION Right 09/29/2014   Procedure: HECTOR FRACTURE FIXATION, POSSIBLE LIGAMENT REPAIR;  Surgeon: Eva Elsie Herring, MD;  Location: [facility];  Service: Orthopedics;  Laterality: Right;  Right elbow coranoid fracture fixation, possible ligament repair   LEFT HEART CATH AND CORONARY ANGIOGRAPHY N/A 03/31/2024   Procedure: LEFT HEART CATH AND CORONARY ANGIOGRAPHY;  Surgeon:  [place], Peter M, MD;  Location: [facility] INVASIVE CV LAB;  Service: Cardiovascular;  Laterality: N/A;   TEE WITHOUT CARDIOVERSION N/A 01/18/2018   Procedure: TRANSESOPHAGEAL ECHOCARDIOGRAM (TEE);  Surgeon: Rolan Ezra RAMAN, MD;  Location: [facility] ENDOSCOPY;  Service: Cardiovascular;  Laterality: N/A;   TEE WITHOUT CARDIOVERSION N/A 04/18/2021   Procedure: TRANSESOPHAGEAL ECHOCARDIOGRAM (TEE);  Surgeon: Alveta Aleene PARAS, MD;  Location: [facility] ENDOSCOPY;  Service: Cardiovascular;  Laterality: N/A;   WISDOM TOOTH EXTRACTION      Allergies  No Known Allergies  History of Present Illness    Samuel Flynn has a PMH of coronary artery disease, sinus bradycardia, paroxysmal atrial fibrillation, OSA, hypocalcemia, gastric bypass surgery, normocytic anemia, and resolved atrial flutter with RVR.  He underwent atrial flutter ablation in 2019.  He had subsequent recurrence.  His PMH also includes diastolic CHF in the setting of atrial arrhythmia.  His bradycardia did not require PPM.  He was admitted to the hospital on 03/31/2024 and discharged on 04/01/2024.  He was ruled in for NSTEMI.  He presented with chest pain and palpitations.  He was noted to have sinus bradycardia and his cardiac troponins were 199-695.  No ischemic changes were noted on his EKG.  He was taken to the cardiac Cath Lab 03/31/2024.  He was noted to have mid LAD stenosis 30%, ostial circumflex-proximal circumflex 20%, normal LV function, normal LVEDP.  He was noted to have LVEF of 55-65%.  Medical management was recommended.  His echocardiogram 04/01/2024 showed an LVEF of 60-65%, normal diastolic parameters.  He has mildly dilated left and right atria.  Trivial mitral valve regurgitation was  noted.  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  He was seen in follow-up by Dr. Inocencio on 04/22/2024.  During that time he reported he was doing well.  He denied further episodes of  atrial fibrillation since being in the hospital.  He denied chest pain, palpitations, dyspnea, orthopnea, nausea, vomiting, dizziness.  His blood pressure was noted to be 126/76.  He reported that he would continue to prefer rate control therapy.  Ablation was discussed and plan.  CT prior to ablation was scheduled.  Follow-up in the A-fib clinic post procedure was also planned.  He presents to the clinic today for follow-up evaluation and states***.  *** denies chest pain, shortness of breath, lower extremity edema, fatigue, palpitations, melena, hematuria, hemoptysis, diaphoresis, weakness, presyncope, syncope, orthopnea, and PND.  Coronary artery disease-no chest pain today.  NSTEMI 03/31/2024.  He was noted to have nonobstructive disease.  Medical management was recommended.  Not a candidate for beta-blocker therapy due to sinus bradycardia. Heart healthy low-sodium diet Continue aspirin , rosuvastatin   Hyperlipidemia-LDL***. High-fiber diet Crease physical activity as tolerated Continue aspirin , atorvastatin  Paroxysmal atrial fibrillation, atrial flutter, bradycardia-heart rate today***.  Followed up with EP on 04/22/2024.  Options for management of atrial fibrillation/flutter were discussed.  Plans were made to proceed with ablation. Following with the EP  [facility]-reports compliance with CPAP. Sleep hygiene instructions given Avoid supine sleeping Continue CPAP use  Disposition: Follow-up with Dr. Raford or me in 3-4 months.  Home Medications    Prior to Admission medications   Medication Sig Start Date End Date Taking? Authorizing Provider  doxycycline  (VIBRA -TABS) 100 MG tablet Take 1 tablet (100 mg total) by mouth 2 (two) times daily. 04/07/24   [name], MD  pantoprazole  (PROTONIX ) 40 MG tablet Take 1 tablet (40 mg total) by mouth daily. 02/06/24   Lorren Greig PARAS, NP  rivaroxaban  (XARELTO ) 20 MG TABS tablet Take 1 tablet (20 mg total) by mouth daily with supper. 04/07/24    Leverne Charlies Helling, PA-C  rosuvastatin  (CRESTOR ) 20 MG tablet Take 1 tablet (20 mg total) by mouth daily. 04/01/24   Garrick Leontine SAILOR, PA-C    Family History    Family History  Problem Relation Age of Onset   Hypertension Paternal Uncle    Hypertension Maternal Grandmother    Hypertension Paternal Uncle    Hypertension Paternal Uncle    Hypertension Paternal Uncle    Hypertension Paternal Uncle    Hypertension Mother    Stroke Father    Heart disease Father    Heart attack Father    Hypertension Father    Colon cancer Neg Hx    He indicated that his mother is alive. He indicated that his father is alive. He indicated that his maternal grandmother is deceased. He indicated that his maternal grandfather is deceased. He indicated that his paternal grandmother is deceased. He indicated that his paternal grandfather is deceased. He indicated that all of his five paternal uncles are alive. He indicated that the status of his neg hx is unknown.  Social History    Social History   Socioeconomic History   Marital status: Married    Spouse name: Not on file   Number of children: 4   Years of education: 10   Highest education level: 8th grade  Occupational History   Occupation: Truck Air traffic controller: [employer]  Tobacco Use  Smoking status: Former    Types: Cigars    Quit date: 07/20/2021    Years since quitting: 2.7   Smokeless tobacco: Never   Tobacco comments:    Former smoker 08/16/21  Vaping Use   Vaping status: Never Used  Substance and Sexual Activity   Alcohol use: Not Currently    Comment: occ   Drug use: No   Sexual activity: Yes    Comment: 5 a day  Other Topics Concern   Not on file  Social History Narrative   Not on file   Social Drivers of Health   Financial Resource Strain: Low Risk  (02/06/2024)   Overall Financial Resource Strain (CARDIA)    Difficulty of Paying Living Expenses: Not hard at all  Food Insecurity: No Food Insecurity  (03/31/2024)   Hunger Vital Sign    Worried About Running Out of Food in the Last Year: Never true    Ran Out of Food in the Last Year: Never true  Transportation Needs: No Transportation Needs (03/31/2024)   PRAPARE - Administrator, Civil Service (Medical): No    Lack of Transportation (Non-Medical): No  Physical Activity: Unknown (02/06/2024)   Exercise Vital Sign    Days of Exercise per Week: 0 days    Minutes of Exercise per Session: Not on file  Stress: Stress Concern Present (02/06/2024)   [employer] of Occupational Health - Occupational Stress Questionnaire    Feeling of Stress : Very much  Social Connections: Moderately Isolated (02/06/2024)   Social Connection and Isolation Panel    Frequency of Communication with Friends and Family: More than three times a week    Frequency of Social Gatherings with Friends and Family: More than three times a week    Attends Religious Services: Never    Database administrator or Organizations: No    Attends Engineer, structural: Not on file    Marital Status: Married  Catering manager Violence: Not At Risk (03/31/2024)   Humiliation, Afraid, Rape, and Kick questionnaire    Fear of Current or Ex-Partner: No    Emotionally Abused: No    Physically Abused: No    Sexually Abused: No     Review of Systems    General:  No chills, fever, night sweats or weight changes.  Cardiovascular:  No chest pain, dyspnea on exertion, edema, orthopnea, palpitations, paroxysmal nocturnal dyspnea. Dermatological: No rash, lesions/masses Respiratory: No cough, dyspnea Urologic: No hematuria, dysuria Abdominal:   No nausea, vomiting, diarrhea, bright red blood per rectum, melena, or hematemesis Neurologic:  No visual changes, wkns, changes in mental status. All other systems reviewed and are otherwise negative except as noted above.  Physical Exam    VS:  There were no vitals taken for this visit. , BMI There is no height or weight on  file to calculate BMI. GEN: Well nourished, well developed, in no acute distress. HEENT: normal. Neck: Supple, no JVD, carotid bruits, or masses. Cardiac: RRR, no murmurs, rubs, or gallops. No clubbing, cyanosis, edema.  Radials/DP/PT 2+ and equal bilaterally.  Respiratory:  Respirations regular and unlabored, clear to auscultation bilaterally. GI: Soft, nontender, nondistended, BS + x 4. MS: no deformity or atrophy. Skin: warm and dry, no rash. Neuro:  Strength and sensation are intact. Psych: Normal affect.  Accessory Clinical Findings    Recent Labs: 02/06/2024: TSH 1.550 03/31/2024: ALT 12; BUN 10; Creatinine, Ser 0.97; Magnesium  2.1; Potassium 4.2; Sodium 140 04/01/2024: Hemoglobin 12.3; Platelets 194  Recent Lipid Panel    Component Value Date/Time   CHOL 87 04/01/2024 0228   TRIG 40 04/01/2024 0228   HDL 42 04/01/2024 0228   CHOLHDL 2.1 04/01/2024 0228   VLDL 8 04/01/2024 0228   LDLCALC 37 04/01/2024 0228   LDLDIRECT 107.0 (H) 04/16/2021 0413         ECG personally reviewed by me today- ***     Cardiac catheterization 03/31/2024     Mid LAD lesion is 30% stenosed.   Ost Cx to Prox Cx lesion is 20% stenosed.   The left ventricular systolic function is normal.   LV end diastolic pressure is normal.   The left ventricular ejection fraction is 55-65% by visual estimate.   Nonobstructive CAD Normal LV function Normal LVEDP 8 mm Hg   Plan: medical management.   Echocardiogram 7/29 IMPRESSIONS     1. Left ventricular ejection fraction, by estimation, is 60 to 65%. The  left ventricle has normal function. The left ventricle has no regional  wall motion abnormalities. Left ventricular diastolic parameters were  normal. The average left ventricular  global longitudinal strain is -23.6 %. The global longitudinal strain is  normal.   2. Right ventricular systolic function is normal. The right ventricular  size is normal.   3. Left atrial size was mildly dilated.    4. Right atrial size was mildly dilated.   5. The mitral valve is grossly normal. Trivial mitral valve  regurgitation. No evidence of mitral stenosis. There is mild late systolic  prolapse of the middle segment of the anterior leaflet of the mitral  valve.   6. The aortic valve was not well visualized. Aortic valve regurgitation  is not visualized. No aortic stenosis is present.   7. The inferior vena cava is dilated in size with <50% respiratory  variability, suggesting right atrial pressure of 15 mmHg.       Assessment & Plan   1.  ***   Josefa HERO. Faythe Heitzenrater NP-C     04/22/2024, 3:53 PM [facility] [address] [place], [place] [address] Office [phone]      I spent***minutes examining this patient, reviewing medications, and using patient centered shared decision making involving their cardiac care.   I spent  20 minutes reviewing past medical history,  medications, and prior cardiac tests.

## 2024-04-22 NOTE — Patient Instructions (Addendum)
 Medication Instructions:  Your physician has recommended you make the following change in your medication: STOP Aspirin   *If you need a refill on your cardiac medications before your next appointment, please call your pharmacy*   Lab Work: Pre procedure labs -- we will call you to schedule:  BMP & CBC  If you have a lab test that is abnormal and we need to change your treatment, we will call you to review the results -- otherwise no news is good news.    Testing/Procedures: Your physician has requested that you have cardiac CT 3 weeks PRIOR to your ablation. Cardiac computed tomography (CT) is a painless test that uses an x-ray machine to take clear, detailed pictures of your heart. We will contact you if the result is abnormal. We will call you to schedule.  Your physician has recommended that you have an ablation. Catheter ablation is a medical procedure used to treat some cardiac arrhythmias (irregular heartbeats). During catheter ablation, a long, thin, flexible tube is put into a blood vessel in your groin (upper thigh), or neck. This tube is called an ablation catheter. It is then guided to your heart through the blood vessel. Radio frequency waves destroy small areas of heart tissue where abnormal heartbeats may cause an arrhythmia to start.   Your ablation is scheduled for 07/01/2024. Please arrive at [facility] at 8:00 am.  We will call/send instructions at a later date.   Follow-Up: At [facility], you and your health needs are our priority.  As part of our continuing mission to provide you with exceptional heart care, we have created designated Provider Care Teams.  These Care Teams include your primary Cardiologist (physician) and Advanced Practice Providers (APPs -  Physician Assistants and Nurse Practitioners) who all work together to provide you with the care you need, when you need it.  Your next appointment:   1 month(s) after your ablation  The format for your  next appointment:   In Person  Provider:   AF[facility] clinic   Thank you for choosing [facility]!!   Maeola Domino, RN [phone]    Other Instructions   Cardiac Ablation Cardiac ablation is a procedure to destroy (ablate) some heart tissue that is sending bad signals. These bad signals cause problems in heart rhythm. The heart has many areas that make these signals. If there are problems in these areas, they can make the heart beat in a way that is not normal. Destroying some tissues can help make the heart rhythm normal. Tell your doctor about: Any allergies you have. All medicines you are taking. These include vitamins, herbs, eye drops, creams, and over-the-counter medicines. Any problems you or family members have had with medicines that make you fall asleep (anesthetics). Any blood disorders you have. Any surgeries you have had. Any medical conditions you have, such as kidney failure. Whether you are pregnant or may be pregnant. What are the risks? This is a safe procedure. But problems may occur, including: Infection. Bruising and bleeding. Bleeding into the chest. Stroke or blood clots. Damage to nearby areas of your body. Allergies to medicines or dyes. The need for a pacemaker if the normal system is damaged. Failure of the procedure to treat the problem. What happens before the procedure? Medicines Ask your doctor about: Changing or stopping your normal medicines. This is important. Taking aspirin  and ibuprofen . Do not take these medicines unless your doctor tells you to take them. Taking other medicines, vitamins, herbs, and  supplements. General instructions Follow instructions from your doctor about what you cannot eat or drink. Plan to have someone take you home from the hospital or clinic. If you will be going home right after the procedure, plan to have someone with you for 24 hours. Ask your doctor what steps will be taken to prevent infection. What  happens during the procedure?  An IV tube will be put into one of your veins. You will be given a medicine to help you relax. The skin on your neck or groin will be numbed. A cut (incision) will be made in your neck or groin. A needle will be put through your cut and into a large vein. A tube (catheter) will be put into the needle. The tube will be moved to your heart. Dye may be put through the tube. This helps your doctor see your heart. Small devices (electrodes) on the tube will send out signals. A type of energy will be used to destroy some heart tissue. The tube will be taken out. Pressure will be held on your cut. This helps stop bleeding. A bandage will be put over your cut. The exact procedure may vary among doctors and hospitals. What happens after the procedure? You will be watched until you leave the hospital or clinic. This includes checking your heart rate, breathing rate, oxygen , and blood pressure. Your cut will be watched for bleeding. You will need to lie still for a few hours. Do not drive for 24 hours or as long as your doctor tells you. Summary Cardiac ablation is a procedure to destroy some heart tissue. This is done to treat heart rhythm problems. Tell your doctor about any medical conditions you may have. Tell him or her about all medicines you are taking to treat them. This is a safe procedure. But problems may occur. These include infection, bruising, bleeding, and damage to nearby areas of your body. Follow what your doctor tells you about food and drink. You may also be told to change or stop some of your medicines. After the procedure, do not drive for 24 hours or as long as your doctor tells you. This information is not intended to replace advice given to you by your health care provider. Make sure you discuss any questions you have with your health care provider. Document Revised: 11/11/2021 Document Reviewed: 07/24/2019 Elsevier Patient Education  2023  Elsevier Inc.   Cardiac Ablation, Care After  This sheet gives you information about how to care for yourself after your procedure. Your health care provider may also give you more specific instructions. If you have problems or questions, contact your health care provider. What can I expect after the procedure? After the procedure, it is common to have: Bruising around your puncture site. Tenderness around your puncture site. Skipped heartbeats. If you had an atrial fibrillation ablation, you may have atrial fibrillation during the first several months after your procedure.  Tiredness (fatigue).  Follow these instructions at home: Puncture site care  Follow instructions from your health care provider about how to take care of your puncture site. Make sure you: If present, leave stitches (sutures), skin glue, or adhesive strips in place. These skin closures may need to stay in place for up to 2 weeks. If adhesive strip edges start to loosen and curl up, you may trim the loose edges. Do not remove adhesive strips completely unless your health care provider tells you to do that. If a large square bandage is present, this  may be removed 24 hours after surgery.  Check your puncture site every day for signs of infection. Check for: Redness, swelling, or pain. Fluid or blood. If your puncture site starts to bleed, lie down on your back, apply firm pressure to the area, and contact your health care provider. Warmth. Pus or a bad smell. A pea or small marble sized lump at the site is normal and can take up to three months to resolve.  Driving Do not drive for at least 4 days after your procedure or however long your health care provider recommends. (Do not resume driving if you have previously been instructed not to drive for other health reasons.) Do not drive or use heavy machinery while taking prescription pain medicine. Activity Avoid activities that take a lot of effort for at least 7 days  after your procedure. Do not lift anything that is heavier than 5 lb (4.5 kg) for one week.  No sexual activity for 1 week.  Return to your normal activities as told by your health care provider. Ask your health care provider what activities are safe for you. General instructions Take over-the-counter and prescription medicines only as told by your health care provider. Do not use any products that contain nicotine or tobacco, such as cigarettes and e-cigarettes. If you need help quitting, ask your health care provider. You may shower after 24 hours, but Do not take baths, swim, or use a hot tub for 1 week.  Do not drink alcohol for 24 hours after your procedure. Keep all follow-up visits as told by your health care provider. This is important. Contact a health care provider if: You have redness, mild swelling, or pain around your puncture site. You have fluid or blood coming from your puncture site that stops after applying firm pressure to the area. Your puncture site feels warm to the touch. You have pus or a bad smell coming from your puncture site. You have a fever. You have chest pain or discomfort that spreads to your neck, jaw, or arm. You have chest pain that is worse with lying on your back or taking a deep breath. You are sweating a lot. You feel nauseous. You have a fast or irregular heartbeat. You have shortness of breath. You are dizzy or light-headed and feel the need to lie down. You have pain or numbness in the arm or leg closest to your puncture site. Get help right away if: Your puncture site suddenly swells. Your puncture site is bleeding and the bleeding does not stop after applying firm pressure to the area. These symptoms may represent a serious problem that is an emergency. Do not wait to see if the symptoms will go away. Get medical help right away. Call your local emergency services (911 in the [place] Do not drive yourself to the hospital. Summary After the  procedure, it is normal to have bruising and tenderness at the puncture site in your groin, neck, or forearm. Check your puncture site every day for signs of infection. Get help right away if your puncture site is bleeding and the bleeding does not stop after applying firm pressure to the area. This is a medical emergency. This information is not intended to replace advice given to you by your health care provider. Make sure you discuss any questions you have with your health care provider.

## 2024-04-24 ENCOUNTER — Ambulatory Visit: Attending: General Practice | Admitting: General Practice

## 2024-04-28 ENCOUNTER — Encounter (HOSPITAL_COMMUNITY): Payer: Self-pay | Admitting: Emergency Medicine

## 2024-04-28 ENCOUNTER — Other Ambulatory Visit: Payer: Self-pay

## 2024-04-28 ENCOUNTER — Emergency Department (HOSPITAL_COMMUNITY): Admitting: Anesthesiology

## 2024-04-28 ENCOUNTER — Inpatient Hospital Stay (HOSPITAL_COMMUNITY)
Admission: EM | Admit: 2024-04-28 | Discharge: 2024-05-06 | DRG: 326 | Disposition: A | Discharge status: Home / Self Care | Attending: Internal Medicine | Admitting: Internal Medicine

## 2024-04-28 ENCOUNTER — Encounter (HOSPITAL_COMMUNITY)
Admission: EM | Disposition: A | Discharge status: Home / Self Care | Payer: Self-pay | Source: Home / Self Care | Attending: Internal Medicine

## 2024-04-28 ENCOUNTER — Emergency Department (HOSPITAL_COMMUNITY)

## 2024-04-28 DIAGNOSIS — I2510 Atherosclerotic heart disease of native coronary artery without angina pectoris: Secondary | ICD-10-CM | POA: Diagnosis present

## 2024-04-28 DIAGNOSIS — Z8711 Personal history of peptic ulcer disease: Secondary | ICD-10-CM | POA: Diagnosis not present

## 2024-04-28 DIAGNOSIS — R188 Other ascites: Secondary | ICD-10-CM | POA: Diagnosis present

## 2024-04-28 DIAGNOSIS — K668 Other specified disorders of peritoneum: Secondary | ICD-10-CM | POA: Diagnosis present

## 2024-04-28 DIAGNOSIS — Y92009 Unspecified place in unspecified non-institutional (private) residence as the place of occurrence of the external cause: Secondary | ICD-10-CM | POA: Diagnosis not present

## 2024-04-28 DIAGNOSIS — I214 Non-ST elevation (NSTEMI) myocardial infarction: Secondary | ICD-10-CM | POA: Diagnosis present

## 2024-04-28 DIAGNOSIS — Z91128 Patient's intentional underdosing of medication regimen for other reason: Secondary | ICD-10-CM | POA: Diagnosis not present

## 2024-04-28 DIAGNOSIS — T466X6A Underdosing of antihyperlipidemic and antiarteriosclerotic drugs, initial encounter: Secondary | ICD-10-CM | POA: Diagnosis present

## 2024-04-28 DIAGNOSIS — T39016A Underdosing of aspirin, initial encounter: Secondary | ICD-10-CM | POA: Diagnosis present

## 2024-04-28 DIAGNOSIS — Z87891 Personal history of nicotine dependence: Secondary | ICD-10-CM

## 2024-04-28 DIAGNOSIS — Z823 Family history of stroke: Secondary | ICD-10-CM

## 2024-04-28 DIAGNOSIS — Y838 Other surgical procedures as the cause of abnormal reaction of the patient, or of later complication, without mention of misadventure at the time of the procedure: Secondary | ICD-10-CM | POA: Diagnosis present

## 2024-04-28 DIAGNOSIS — T508X5A Adverse effect of diagnostic agents, initial encounter: Secondary | ICD-10-CM | POA: Diagnosis present

## 2024-04-28 DIAGNOSIS — R001 Bradycardia, unspecified: Secondary | ICD-10-CM | POA: Diagnosis present

## 2024-04-28 DIAGNOSIS — N179 Acute kidney failure, unspecified: Secondary | ICD-10-CM | POA: Diagnosis present

## 2024-04-28 DIAGNOSIS — G4733 Obstructive sleep apnea (adult) (pediatric): Secondary | ICD-10-CM

## 2024-04-28 DIAGNOSIS — K529 Noninfective gastroenteritis and colitis, unspecified: Secondary | ICD-10-CM | POA: Diagnosis present

## 2024-04-28 DIAGNOSIS — K9589 Other complications of other bariatric procedure: Principal | ICD-10-CM | POA: Diagnosis present

## 2024-04-28 DIAGNOSIS — Z8249 Family history of ischemic heart disease and other diseases of the circulatory system: Secondary | ICD-10-CM

## 2024-04-28 DIAGNOSIS — I4892 Unspecified atrial flutter: Secondary | ICD-10-CM | POA: Diagnosis present

## 2024-04-28 DIAGNOSIS — K631 Perforation of intestine (nontraumatic): Principal | ICD-10-CM

## 2024-04-28 DIAGNOSIS — I480 Paroxysmal atrial fibrillation: Secondary | ICD-10-CM | POA: Diagnosis present

## 2024-04-28 DIAGNOSIS — Z7901 Long term (current) use of anticoagulants: Secondary | ICD-10-CM

## 2024-04-28 DIAGNOSIS — K285 Chronic or unspecified gastrojejunal ulcer with perforation: Secondary | ICD-10-CM | POA: Diagnosis present

## 2024-04-28 DIAGNOSIS — I252 Old myocardial infarction: Secondary | ICD-10-CM

## 2024-04-28 DIAGNOSIS — I10 Essential (primary) hypertension: Secondary | ICD-10-CM

## 2024-04-28 DIAGNOSIS — Z9884 Bariatric surgery status: Secondary | ICD-10-CM | POA: Diagnosis not present

## 2024-04-28 DIAGNOSIS — K659 Peritonitis, unspecified: Secondary | ICD-10-CM | POA: Diagnosis present

## 2024-04-28 DIAGNOSIS — F419 Anxiety disorder, unspecified: Secondary | ICD-10-CM | POA: Diagnosis present

## 2024-04-28 DIAGNOSIS — Z79899 Other long term (current) drug therapy: Secondary | ICD-10-CM

## 2024-04-28 HISTORY — PX: LAPAROSCOPY: SHX197

## 2024-04-28 HISTORY — PX: HIATAL HERNIA REPAIR: SHX195

## 2024-04-28 LAB — I-STAT CHEM 8, ED
BUN: 15 mg/dL (ref 6–20)
Calcium, Ion: 1.07 mmol/L — ABNORMAL LOW (ref 1.15–1.40)
Chloride: 107 mmol/L (ref 98–111)
Creatinine, Ser: 1.2 mg/dL (ref 0.61–1.24)
Glucose, Bld: 107 mg/dL — ABNORMAL HIGH (ref 70–99)
HCT: 39 % (ref 39.0–52.0)
Hemoglobin: 13.3 g/dL (ref 13.0–17.0)
Potassium: 4.3 mmol/L (ref 3.5–5.1)
Sodium: 141 mmol/L (ref 135–145)
TCO2: 24 mmol/L (ref 22–32)

## 2024-04-28 LAB — COMPREHENSIVE METABOLIC PANEL WITH GFR
ALT: 22 U/L (ref 0–44)
AST: 19 U/L (ref 15–41)
Albumin: 3.5 g/dL (ref 3.5–5.0)
Alkaline Phosphatase: 98 U/L (ref 38–126)
Anion gap: 11 (ref 5–15)
BUN: 14 mg/dL (ref 6–20)
CO2: 23 mmol/L (ref 22–32)
Calcium: 8.1 mg/dL — ABNORMAL LOW (ref 8.9–10.3)
Chloride: 105 mmol/L (ref 98–111)
Creatinine, Ser: 1.25 mg/dL — ABNORMAL HIGH (ref 0.61–1.24)
GFR, Estimated: >60 mL/min (ref >60)
Glucose, Bld: 109 mg/dL — ABNORMAL HIGH (ref 70–99)
Potassium: 4.2 mmol/L (ref 3.5–5.1)
Sodium: 139 mmol/L (ref 135–145)
Total Bilirubin: 1.8 mg/dL — ABNORMAL HIGH (ref 0.0–1.2)
Total Protein: 6.3 g/dL — ABNORMAL LOW (ref 6.5–8.1)

## 2024-04-28 LAB — CBC WITH DIFFERENTIAL/PLATELET
Abs Immature Granulocytes: 0.03 K/uL (ref 0.00–0.07)
Basophils Absolute: 0 K/uL (ref 0.0–0.1)
Basophils Relative: 0 %
Eosinophils Absolute: 0 K/uL (ref 0.0–0.5)
Eosinophils Relative: 0 %
HCT: 37.9 % — ABNORMAL LOW (ref 39.0–52.0)
Hemoglobin: 12.8 g/dL — ABNORMAL LOW (ref 13.0–17.0)
Immature Granulocytes: 0 %
Lymphocytes Relative: 6 %
Lymphs Abs: 0.6 K/uL — ABNORMAL LOW (ref 0.7–4.0)
MCH: 29.4 pg (ref 26.0–34.0)
MCHC: 33.8 g/dL (ref 30.0–36.0)
MCV: 86.9 fL (ref 80.0–100.0)
Monocytes Absolute: 0.4 K/uL (ref 0.1–1.0)
Monocytes Relative: 3 %
Neutro Abs: 9.9 K/uL — ABNORMAL HIGH (ref 1.7–7.7)
Neutrophils Relative %: 91 %
Platelets: 259 K/uL (ref 150–400)
RBC: 4.36 MIL/uL (ref 4.22–5.81)
RDW: 17.7 % — ABNORMAL HIGH (ref 11.5–15.5)
WBC: 10.9 K/uL — ABNORMAL HIGH (ref 4.0–10.5)
nRBC: 0 % (ref 0.0–0.2)

## 2024-04-28 LAB — TROPONIN I (HIGH SENSITIVITY)
Troponin I (High Sensitivity): 4 ng/L (ref <18)
Troponin I (High Sensitivity): 3 ng/L (ref <18)

## 2024-04-28 LAB — I-STAT CG4 LACTIC ACID, ED
Lactic Acid, Venous: 0.9 mmol/L (ref 0.5–1.9)
Lactic Acid, Venous: 1 mmol/L (ref 0.5–1.9)

## 2024-04-28 LAB — LIPASE, BLOOD: Lipase: 51 U/L (ref 11–51)

## 2024-04-28 SURGERY — LAPAROSCOPY, DIAGNOSTIC
Anesthesia: General | Site: Abdomen

## 2024-04-28 MED ORDER — ROCURONIUM BROMIDE 10 MG/ML (PF) SYRINGE
PREFILLED_SYRINGE | INTRAVENOUS | Status: AC
Start: 2024-04-28 — End: 2024-04-28
  Filled 2024-04-28: qty 10

## 2024-04-28 MED ORDER — PROPOFOL 10 MG/ML IV BOLUS
INTRAVENOUS | Status: DC | PRN
  Administered 2024-04-28: 100 mg via INTRAVENOUS

## 2024-04-28 MED ORDER — PROPOFOL 10 MG/ML IV BOLUS
INTRAVENOUS | Status: AC
  Filled 2024-04-28: qty 20

## 2024-04-28 MED ORDER — IOHEXOL 350 MG/ML SOLN
100.0000 mL | Freq: Once | INTRAVENOUS | Status: AC | PRN
  Administered 2024-04-28: 100 mL via INTRAVENOUS

## 2024-04-28 MED ORDER — LACTATED RINGERS IV SOLN: INTRAVENOUS | Status: DC

## 2024-04-28 MED ORDER — PHENYLEPHRINE HCL-NACL 20-0.9 MG/250ML-% IV SOLN
INTRAVENOUS | Status: DC | PRN
  Administered 2024-04-28: 40 ug/min via INTRAVENOUS

## 2024-04-28 MED ORDER — ROCURONIUM BROMIDE 10 MG/ML (PF) SYRINGE
PREFILLED_SYRINGE | INTRAVENOUS | Status: DC | PRN
  Administered 2024-04-28: 10 mg via INTRAVENOUS
  Administered 2024-04-28: 20 mg via INTRAVENOUS
  Administered 2024-04-28: 50 mg via INTRAVENOUS

## 2024-04-28 MED ORDER — PHENYLEPHRINE 80 MCG/ML (10ML) SYRINGE FOR IV PUSH (FOR BLOOD PRESSURE SUPPORT)
PREFILLED_SYRINGE | INTRAVENOUS | Status: DC | PRN
  Administered 2024-04-28: 80 ug via INTRAVENOUS
  Administered 2024-04-28: 240 ug via INTRAVENOUS

## 2024-04-28 MED ORDER — LIDOCAINE 2% (20 MG/ML) 5 ML SYRINGE
INTRAMUSCULAR | Status: DC | PRN
  Administered 2024-04-28: 60 mg via INTRAVENOUS

## 2024-04-28 MED ORDER — SODIUM CHLORIDE 0.9 % IV BOLUS
1000.0000 mL | Freq: Once | INTRAVENOUS | Status: AC
  Administered 2024-04-28: 1000 mL via INTRAVENOUS

## 2024-04-28 MED ORDER — SUCCINYLCHOLINE CHLORIDE 200 MG/10ML IV SOSY
PREFILLED_SYRINGE | INTRAVENOUS | Status: DC | PRN
  Administered 2024-04-28: 100 mg via INTRAVENOUS

## 2024-04-28 MED ORDER — LACTATED RINGERS IV SOLN: INTRAVENOUS | Status: DC | PRN

## 2024-04-28 MED ORDER — HYDROMORPHONE HCL 1 MG/ML IJ SOLN
1.0000 mg | Freq: Once | INTRAMUSCULAR | Status: AC
  Administered 2024-04-28: 1 mg via INTRAVENOUS
  Filled 2024-04-28: qty 1

## 2024-04-28 MED ORDER — MEPERIDINE HCL 25 MG/ML IJ SOLN: 6.2500 mg | INTRAMUSCULAR | Status: DC | PRN

## 2024-04-28 MED ORDER — ONDANSETRON HCL 4 MG/2ML IJ SOLN
INTRAMUSCULAR | Status: DC | PRN
  Administered 2024-04-28: 4 mg via INTRAVENOUS

## 2024-04-28 MED ORDER — FENTANYL CITRATE PF 50 MCG/ML IJ SOSY
100.0000 ug | PREFILLED_SYRINGE | Freq: Once | INTRAMUSCULAR | Status: AC
  Administered 2024-04-28: 100 ug via INTRAVENOUS
  Filled 2024-04-28: qty 2

## 2024-04-28 MED ORDER — DEXAMETHASONE SODIUM PHOSPHATE 10 MG/ML IJ SOLN
INTRAMUSCULAR | Status: DC | PRN
  Administered 2024-04-28: 10 mg via INTRAVENOUS

## 2024-04-28 MED ORDER — PHENYLEPHRINE 80 MCG/ML (10ML) SYRINGE FOR IV PUSH (FOR BLOOD PRESSURE SUPPORT)
PREFILLED_SYRINGE | INTRAVENOUS | Status: AC
  Filled 2024-04-28: qty 10

## 2024-04-28 MED ORDER — FENTANYL CITRATE (PF) 250 MCG/5ML IJ SOLN
INTRAMUSCULAR | Status: DC | PRN
  Administered 2024-04-28: 50 ug via INTRAVENOUS
  Administered 2024-04-28: 100 ug via INTRAVENOUS
  Administered 2024-04-29 (×2): 50 ug via INTRAVENOUS

## 2024-04-28 MED ORDER — BUPIVACAINE HCL 0.25 % IJ SOLN
INTRAMUSCULAR | Status: DC | PRN
Start: 2024-04-28 — End: 2024-04-28
  Administered 2024-04-28: 6 mL

## 2024-04-28 MED ORDER — EMPTY CONTAINERS FLEXIBLE MISC: 900.0000 mg | Freq: Once | Status: DC

## 2024-04-28 MED ORDER — HYDROMORPHONE HCL 1 MG/ML IJ SOLN: 0.2500 mg | INTRAMUSCULAR | Status: DC | PRN

## 2024-04-28 MED ORDER — MIDAZOLAM HCL 2 MG/2ML IJ SOLN
INTRAMUSCULAR | Status: AC
  Filled 2024-04-28: qty 2

## 2024-04-28 MED ORDER — ACETAMINOPHEN 10 MG/ML IV SOLN: 1000.0000 mg | Freq: Once | INTRAVENOUS | Status: DC | PRN

## 2024-04-28 MED ORDER — DEXAMETHASONE SODIUM PHOSPHATE 10 MG/ML IJ SOLN
INTRAMUSCULAR | Status: AC
  Filled 2024-04-28: qty 1

## 2024-04-28 MED ORDER — ACETAMINOPHEN 10 MG/ML IV SOLN
INTRAVENOUS | Status: DC | PRN
  Administered 2024-04-28: 1000 mg via INTRAVENOUS

## 2024-04-28 MED ORDER — OXYCODONE HCL 5 MG PO TABS: 5.0000 mg | ORAL_TABLET | Freq: Once | ORAL | Status: DC | PRN

## 2024-04-28 MED ORDER — LIDOCAINE 2% (20 MG/ML) 5 ML SYRINGE
INTRAMUSCULAR | Status: AC
  Filled 2024-04-28: qty 5

## 2024-04-28 MED ORDER — BUPIVACAINE HCL (PF) 0.25 % IJ SOLN
INTRAMUSCULAR | Status: AC
  Filled 2024-04-28: qty 30

## 2024-04-28 MED ORDER — MIDAZOLAM HCL 2 MG/2ML IJ SOLN
INTRAMUSCULAR | Status: DC | PRN
  Administered 2024-04-28: 2 mg via INTRAVENOUS

## 2024-04-28 MED ORDER — ONDANSETRON HCL 4 MG/2ML IJ SOLN
4.0000 mg | Freq: Once | INTRAMUSCULAR | Status: AC
  Administered 2024-04-28: 4 mg via INTRAVENOUS
  Filled 2024-04-28: qty 2

## 2024-04-28 MED ORDER — SODIUM CHLORIDE 0.9 % IR SOLN
Status: DC | PRN
  Administered 2024-04-28: 2000 mL

## 2024-04-28 MED ORDER — PROTHROMBIN COMPLEX CONC HUMAN 500 UNITS IV KIT
2330.0000 [IU] | PACK | Status: AC
  Administered 2024-04-28: 2330 [IU] via INTRAVENOUS
  Filled 2024-04-28: qty 2330

## 2024-04-28 MED ORDER — OXYCODONE HCL 5 MG/5ML PO SOLN: 5.0000 mg | Freq: Once | ORAL | Status: DC | PRN

## 2024-04-28 MED ORDER — MORPHINE SULFATE (PF) 4 MG/ML IV SOLN
8.0000 mg | Freq: Once | INTRAVENOUS | Status: AC
  Administered 2024-04-28: 8 mg via INTRAVENOUS
  Filled 2024-04-28: qty 2

## 2024-04-28 MED ORDER — ONDANSETRON HCL 4 MG/2ML IJ SOLN
INTRAMUSCULAR | Status: AC
Start: 2024-04-28 — End: 2024-04-28
  Filled 2024-04-28: qty 2

## 2024-04-28 MED ORDER — DROPERIDOL 2.5 MG/ML IJ SOLN: 0.6250 mg | Freq: Once | INTRAMUSCULAR | Status: DC | PRN

## 2024-04-28 MED ORDER — PIPERACILLIN-TAZOBACTAM 3.375 G IVPB 30 MIN
3.3750 g | Freq: Once | INTRAVENOUS | Status: AC
  Administered 2024-04-28: 3.375 g via INTRAVENOUS
  Filled 2024-04-28: qty 50

## 2024-04-28 MED ORDER — SUCCINYLCHOLINE CHLORIDE 200 MG/10ML IV SOSY
PREFILLED_SYRINGE | INTRAVENOUS | Status: AC
  Filled 2024-04-28: qty 10

## 2024-04-28 MED ORDER — FENTANYL CITRATE (PF) 250 MCG/5ML IJ SOLN
INTRAMUSCULAR | Status: AC
Start: 2024-04-28 — End: 2024-04-28
  Filled 2024-04-28: qty 5

## 2024-04-28 SURGICAL SUPPLY — 42 items
BAG COUNTER SPONGE SURGICOUNT (BAG) ×4 IMPLANT
BLADE CLIPPER SURG (BLADE) ×1 IMPLANT
CANISTER SUCTION 3000ML PPV (SUCTIONS) ×4 IMPLANT
CHLORAPREP W/TINT 26 (MISCELLANEOUS) ×4 IMPLANT
COVER SURGICAL LIGHT HANDLE (MISCELLANEOUS) ×4 IMPLANT
DERMABOND ADVANCED .7 DNX12 (GAUZE/BANDAGES/DRESSINGS) ×4 IMPLANT
DEVICE TROCAR PUNCTURE CLOSURE (ENDOMECHANICALS) ×1 IMPLANT
DRAIN CHANNEL 19F RND (DRAIN) ×1 IMPLANT
DRAPE LAPAROSCOPIC ABDOMINAL (DRAPES) ×4 IMPLANT
ELECTRODE REM PT RTRN 9FT ADLT (ELECTROSURGICAL) ×4 IMPLANT
EVACUATOR SILICONE 100CC (DRAIN) ×1 IMPLANT
GAUZE SPONGE 2X2 STRL 8-PLY (GAUZE/BANDAGES/DRESSINGS) ×1 IMPLANT
GLOVE BIO SURGEON STRL SZ7.5 (GLOVE) ×8 IMPLANT
GLOVE BIOGEL PI IND STRL 8 (GLOVE) ×4 IMPLANT
GOWN STRL REUS W/ TWL LRG LVL3 (GOWN DISPOSABLE) ×8 IMPLANT
GOWN STRL REUS W/ TWL XL LVL3 (GOWN DISPOSABLE) ×4 IMPLANT
HANDLE SUCTION POOLE (INSTRUMENTS) ×4 IMPLANT
IRRIGATION SUCT STRKRFLW 2 WTP (MISCELLANEOUS) ×1 IMPLANT
KIT BASIN OR (CUSTOM PROCEDURE TRAY) ×4 IMPLANT
KIT TURNOVER KIT B (KITS) ×4 IMPLANT
NDL INSUFFLATION 14GA 120MM (NEEDLE) ×3 IMPLANT
NEEDLE INSUFFLATION 14GA 120MM (NEEDLE) ×3 IMPLANT
NS IRRIG 1000ML POUR BTL (IV SOLUTION) ×8 IMPLANT
PACK GENERAL/GYN (CUSTOM PROCEDURE TRAY) ×4 IMPLANT
PAD ARMBOARD POSITIONER FOAM (MISCELLANEOUS) ×8 IMPLANT
SCISSORS LAP 5X35 DISP (ENDOMECHANICALS) ×1 IMPLANT
SET TUBE SMOKE EVAC HIGH FLOW (TUBING) ×4 IMPLANT
SLEEVE Z-THREAD 5X100MM (TROCAR) ×5 IMPLANT
SPONGE DRAIN TRACH 4X4 STRL 2S (GAUZE/BANDAGES/DRESSINGS) ×1 IMPLANT
SUT MNCRL AB 4-0 PS2 18 (SUTURE) ×4 IMPLANT
SUT SILK 2 0 SH (SUTURE) ×5 IMPLANT
SUT SILK 2 0 SH CR/8 (SUTURE) ×4 IMPLANT
SUT SILK 2 0 TIES 10X30 (SUTURE) ×4 IMPLANT
SUT SILK 3 0 SH CR/8 (SUTURE) ×4 IMPLANT
SUT SILK 3 0 TIES 10X30 (SUTURE) ×4 IMPLANT
SUT VICRYL 0 UR6 27IN ABS (SUTURE) ×1 IMPLANT
TOWEL GREEN STERILE FF (TOWEL DISPOSABLE) ×4 IMPLANT
TRAY FOLEY MTR SLVR 16FR STAT (SET/KITS/TRAYS/PACK) ×4 IMPLANT
TRAY LAPAROSCOPIC MC (CUSTOM PROCEDURE TRAY) ×4 IMPLANT
TROCAR 11X100 Z THREAD (TROCAR) ×1 IMPLANT
TROCAR Z-THREAD OPTICAL 5X100M (TROCAR) ×4 IMPLANT
WARMER LAPAROSCOPE (MISCELLANEOUS) ×4 IMPLANT

## 2024-04-28 NOTE — Op Note (Addendum)
 04/28/2024  11:37 PM  PATIENT:  Samuel Flynn  48 y.o. male  PRE-OPERATIVE DIAGNOSIS:  FREE AIR IN THE ABDOMEN  POST-OPERATIVE DIAGNOSIS: Perforated marginal ulcer at the gastrojejunostomy  PROCEDURE:  Procedure(s): LAPAROSCOPY, DIAGNOSTIC (N/A) LAPAROSCOPIC Arlyss patch repair Placement of 19 French Blake drain  SURGEON:  Surgeons and Role:    DEWAINE Rubin Calamity, MD - Primary  ASSISTANTS: none   ANESTHESIA:   local and general  EBL:  minimal   BLOOD ADMINISTERED:none  DRAINS: (19 [place]) Jackson-Pratt drain(s) with closed bulb suction in the abdomen near the [facility] patch repair site  LOCAL MEDICATIONS USED: Marcaine   SPECIMEN:  No Specimen  DISPOSITION OF SPECIMEN:  N/A  COUNTS:  YES  TOURNIQUET:  * No tourniquets in log *  DICTATION: .Dragon Dictation  Indication procedure: Patient is a 48 year old male with a history of Roux-en-Y gastric bypass surgery 2 years ago.  Patient came in with peritonitis and free air seen on CT scan.  Patient was taken back to the operating room emergently for diagnostic laparoscopy.  Findings: Patient with a 1 to 2 cm marginal ulcer at the gastrojejunostomy.  This was reapproximated primarily with 2 oh silks in interrupted fashion.  A piece of omentum was placed over this area and sutured into place.  A 19 [place] Blake drain was placed over this area and sutured to the abdominal wall.  There was a significant amount of bilious ascites within the abdomen and fibrous reaction to the peritoneum.  Details of the procedure. After the patient was consented patient taken back to the OR placed supine position with bilateral SCDs in place.  He underwent general endotracheal intubation.  He was then prepped and draped standard fashion.  A timeout was called all facts verified.  Veress needle technique was used to insufflate the abdomen to 15 m of mercury in the left subcostal margin.  Subsequent to this a 5 mm trocar and camera placed in the  right lower quadrant.  At this time was that of there is large amount of bilious ascites within the abdominal cavity.  There was significant fibrous reaction in the abdominal cavity and on the small bowel.  At this time a 11 mm trocar was placed in the left lower quadrant as well as a 5 mm trocar and direct visualization.  This time patient was position.  At this time approximately 600 cc of bilious ascites was removed from the abdominal cavity with suction irrigation.  At this time I was able to mobilize the area of omentum that was in the midportion of his abdomen.  Upon doing so was evident there was a large ulcer which would appear to be at the gastro jejunostomy site.  At this time 2 oh silks were used to reapproximate this in a Lembert fashion.  A tongue of omentum was placed over this area and sutured into place.  At this time I irrigated out the abdomen with sterile saline.  As much of the saline and bilious ascites was suctioned out as possible.  This time a 33 [place] Blake drain was placed and brought out the right lower quadrant trocar site.  This was placed over the area of the previous repair site.  At this time a 0 Vicryl was used to reapproximate the fascia at the 11 mm port site.  At this time insufflation was evacuated.  All trocars were removed.  Skin was reapproximated using 4-0 Monocryl in subcuticular fashion.  Skin was dressed with Dermabond.  Patient  tolerated the procedure well was taken to the recovery in stable condition.  CASE DATA: Type of patient?: DOW CASE (Surgical Hospitalist [facility] Inpatient) Status of Case? EMERGENT Add On Infection Present At Time Of Surgery (PATOS)?  Bilious ascities   PLAN OF CARE: Admit to inpatient   PATIENT DISPOSITION:  PACU - hemodynamically stable.   Delay start of Pharmacological VTE agent (>24hrs) due to surgical blood loss or risk of bleeding: yes

## 2024-04-28 NOTE — ED Provider Notes (Signed)
 [employer] EMERGENCY DEPARTMENT AT [facility] Provider Note HPI Samuel Flynn is a 48 y.o. male with A-fib on Xarelto , HTN, gastric bypass in 2023 who presents the emergency department with the acute onset of generalized abdominal pain at 3:30 PM.  The patient was in his yard when he had the acute onset of this pain.  No preceding symptoms.  He was eating and drinking well earlier today.  No recent infectious symptoms.  The pain is 10/10 and is diffuse.  He last took his Xarelto  this morning and only missed 1 dose several days ago.  He has been having normal bowel movements and has had a normal bowel movement since the pain started.  He has not vomited but feels nauseous.  Denies hematochezia or melena  Past Medical History:  Diagnosis Date   Atrial fibrillation with RVR (HCC)    Gastric ulcer    Gout    Hypertension    Paroxysmal A-fib (HCC)    Sleep apnea    USES CPAP   Wears glasses    Past Surgical History:  Procedure Laterality Date   ATRIAL FIBRILLATION ABLATION N/A 04/26/2018   Procedure: ATRIAL FIBRILLATION ABLATION;  Surgeon: Inocencio Soyla Lunger, MD;  Location: [facility] INVASIVE CV LAB;  Service: Cardiovascular;  Laterality: N/A;   ATRIAL FIBRILLATION ABLATION N/A 11/07/2019   Procedure: ATRIAL FIBRILLATION ABLATION;  Surgeon: Inocencio Soyla Lunger, MD;  Location: [facility] INVASIVE CV LAB;  Service: Cardiovascular;  Laterality: N/A;   CARDIOVERSION N/A 01/18/2018   Procedure: CARDIOVERSION;  Surgeon: Rolan Ezra RAMAN, MD;  Location: [facility] ENDOSCOPY;  Service: Cardiovascular;  Laterality: N/A;   CARDIOVERSION N/A 08/19/2019   Procedure: CARDIOVERSION;  Surgeon: Alveta Aleene PARAS, MD;  Location: [facility] ENDOSCOPY;  Service: Cardiovascular;  Laterality: N/A;   CARDIOVERSION N/A 04/18/2021   Procedure: CARDIOVERSION;  Surgeon: Alveta Aleene PARAS, MD;  Location: [facility] ENDOSCOPY;  Service: Cardiovascular;  Laterality: N/A;   ELBOW LIGAMENT RECONSTRUCTION Right 09/29/2014   Procedure: HECTOR FRACTURE  FIXATION, POSSIBLE LIGAMENT REPAIR;  Surgeon: Eva Elsie Herring, MD;  Location: [facility];  Service: Orthopedics;  Laterality: Right;  Right elbow coranoid fracture fixation, possible ligament repair   LEFT HEART CATH AND CORONARY ANGIOGRAPHY N/A 03/31/2024   Procedure: LEFT HEART CATH AND CORONARY ANGIOGRAPHY;  Surgeon: Swaziland, Peter M, MD;  Location: [facility] INVASIVE CV LAB;  Service: Cardiovascular;  Laterality: N/A;   TEE WITHOUT CARDIOVERSION N/A 01/18/2018   Procedure: TRANSESOPHAGEAL ECHOCARDIOGRAM (TEE);  Surgeon: Rolan Ezra RAMAN, MD;  Location: [facility] ENDOSCOPY;  Service: Cardiovascular;  Laterality: N/A;   TEE WITHOUT CARDIOVERSION N/A 04/18/2021   Procedure: TRANSESOPHAGEAL ECHOCARDIOGRAM (TEE);  Surgeon: Alveta Aleene PARAS, MD;  Location: [facility] ENDOSCOPY;  Service: Cardiovascular;  Laterality: N/A;   WISDOM TOOTH EXTRACTION      Review of Systems Pertinent positives and negative findings are listed as part of the History of Present Illness and MDM  Physical Exam Vitals:   04/28/24 2015 04/28/24 2030 04/28/24 2129 04/28/24 2130  BP: 139/68 (!) 140/58  (!) 145/87  Pulse: 88 86  88  Resp: (!) 21 (!) 31  (!) 27  Temp:   98 F (36.7 C)   TempSrc:   Oral   SpO2: 100% 97%  99%  Weight:      Height:         Constitutional Nursing notes reviewed Vital signs reviewed Appears in distress  HEENT No obvious trauma Pupils round, equal, and reactive to light. Pupils cross midline Neck supple  Respiratory Effort  normal Breathing well on room air CTAB  CV Normal rate and rhythm  No pitting edema  Abdomen Soft, generalized tenderness to palpation throughout with rebound and guarding  MSK Atraumatic No obvious deformity ROM appropriate  Neuro Awake and alert Pupils cross midline Moving all extremities    MDM: Medical Decision Making Problems Addressed: Perforated bowel (HCC): complicated acute illness or injury that poses a threat to life or bodily  functions  Amount and/or Complexity of Data Reviewed Labs: ordered. Decision-making details documented in ED Course. Radiology: ordered and independent interpretation performed. Decision-making details documented in ED Course.  Risk Prescription drug management. Decision regarding hospitalization.    Initial Differential Diagnoses includes pancreatitis, enteritis, AAA, mesenteric ischemia, ischemic colitis, appendicitis, surgical abdominal emergency  I reviewed the patient's vitals, the nursing triage note and evaluated the patient at bedside.   I reviewed the patient's external records which show that the patient has a history of A-fib on Xarelto .  He recently underwent left heart cath less than 1 month ago for elevated troponin in the setting of A-fib with RVR.  This showed normal LVEF with nonobstructive CAD.  He had a Roux-en-Y in 2023.  CT dissection study ordered to evaluate for aortic dissection, perf, and mesenteric ischemia.  I-STAT lactic acid normal, lowering my suspicion for mesenteric ischemia.  CT shows concern for bowel perforation with small volume pneumoperitoneum and free fluid within the pelvis.  Labs overall reassuring. Patient started on Zosyn , fluids and pain control.  Case discussed with surgery and they will plan for operative management.  Xarelto  reversed with [facility]   Procedures: Procedures  Medications administered in the ED: Medications  fentaNYL  (SUBLIMAZE ) injection 100 mcg (100 mcg Intravenous Given 04/28/24 1759)  ondansetron  (ZOFRAN ) injection 4 mg (4 mg Intravenous Given 04/28/24 1926)  morphine  (PF) 4 MG/ML injection 8 mg (8 mg Intravenous Given 04/28/24 1926)  iohexol  (OMNIPAQUE ) 350 MG/ML injection 100 mL (100 mLs Intravenous Contrast Given 04/28/24 1923)  piperacillin -tazobactam (ZOSYN ) IVPB 3.375 g (0 g Intravenous Stopped 04/28/24 2117)  sodium chloride  0.9 % bolus 1,000 mL (0 mLs Intravenous Paused 04/28/24 2125)  HYDROmorphone  (DILAUDID ) injection 1  mg (1 mg Intravenous Given 04/28/24 2041)  prothrombin  complex conc human (KCENTRA ) IVPB 2,330 Units (0 Units Intravenous Stopped 04/28/24 2158)     Impression: 1. Perforated bowel (HCC)      Patient's presentation is most consistent with acute presentation with potential threat to life or bodily function.  Disposition: ED Disposition:  Linard Dionisio Blunt, MD 04/28/24 2245    Darra Fonda MATSU, MD 05/03/24 [phone]

## 2024-04-28 NOTE — Anesthesia Procedure Notes (Signed)
 Procedure Name: Intubation Date/Time: 04/28/2024 10:35 PM  Performed by: Roddie Grate, CRNAPre-anesthesia Checklist: Patient identified, Emergency Drugs available, Suction available, Patient being monitored and Timeout performed Patient Re-evaluated:Patient Re-evaluated prior to induction Oxygen  Delivery Method: Circle system utilized Preoxygenation: Pre-oxygenation with 100% oxygen  Induction Type: IV induction, Rapid sequence and Cricoid Pressure applied Laryngoscope Size: Mac and 4 Grade View: Grade I Tube type: Oral Tube size: 7.5 mm Number of attempts: 1 Airway Equipment and Method: Stylet Placement Confirmation: ETT inserted through vocal cords under direct vision, positive ETCO2 and breath sounds checked- equal and bilateral Secured at: 23 cm Tube secured with: Tape Dental Injury: Teeth and Oropharynx as per pre-operative assessment  Comments: Smooth IV Induction. Eyes taped. RSI Performed. DL x 1 with grade 1 view. Atraumatically placed, teeth and lip remain intact as pre-op. Secured with tape. Bilateral breath sounds +/=, EtCO2 +, Adequate TV, VSS.

## 2024-04-28 NOTE — ED Notes (Signed)
 Patient transported to CT

## 2024-04-28 NOTE — ED Notes (Signed)
 Called and placed PT on monitor with CCMD

## 2024-04-28 NOTE — Anesthesia Preprocedure Evaluation (Addendum)
 Anesthesia Evaluation  Patient identified by MRN, date of birth, ID band Patient awake    Reviewed: Allergy & Precautions, NPO status , Patient's Chart, lab work & pertinent test results  Airway Mallampati: II  TM Distance: <3 FB Neck ROM: Full    Dental  (+) Teeth Intact, Dental Advisory Given   Pulmonary sleep apnea , former smoker   breath sounds clear to auscultation       Cardiovascular hypertension, + Past MI  + dysrhythmias Atrial Fibrillation  Rhythm:Regular Rate:Normal     Neuro/Psych  PSYCHIATRIC DISORDERS Anxiety      Neuromuscular disease    GI/Hepatic Neg liver ROS, hiatal hernia, PUD,GERD  Medicated,,  Endo/Other  diabetes    Renal/GU negative Renal ROS     Musculoskeletal negative musculoskeletal ROS (+)    Abdominal   Peds  Hematology  (+) Blood dyscrasia, anemia   Anesthesia Other Findings   Reproductive/Obstetrics                              Anesthesia Physical Anesthesia Plan  ASA: 3 and emergent  Anesthesia Plan: General   Post-op Pain Management: Tylenol  PO (pre-op)* and Toradol  IV (intra-op)*   Induction: Intravenous  PONV Risk Score and Plan: 3 and Ondansetron , Dexamethasone  and Midazolam   Airway Management Planned: Oral ETT  Additional Equipment: None  Intra-op Plan:   Post-operative Plan: Extubation in OR  Informed Consent: I have reviewed the patients History and Physical, chart, labs and discussed the procedure including the risks, benefits and alternatives for the proposed anesthesia with the patient or authorized representative who has indicated his/her understanding and acceptance.     Dental advisory given  Plan Discussed with: CRNA  Anesthesia Plan Comments:          Anesthesia Quick Evaluation

## 2024-04-28 NOTE — ED Triage Notes (Signed)
 Pt BIB [facility] from home due to onset abdominal pain with urination right after.  Pt reports diffused pain all over abdomen.  No vomiting or diarrhea.  Hx NSTEMI and gastric bypass.  20g left bicep. 20mcg Fentanyl  given en route. VS BP 174/86, HR 60, CBG 106, SpO2 97%RA

## 2024-04-28 NOTE — H&P (Signed)
 Samuel Flynn is an 48 y.o. male.   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  Patient comes in today secondary to abdominal pain that began at 3:30 PM.  He states that he has generalized abdominal pain.  He had some nausea however no emesis.  He denies any constipation with last bowel movement being this morning.  Patient was seen in the ER and underwent CT scan.  This was significant for pneumoperitoneum and likely layering contrast with jejunal thickening.  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  Currently on Xarelto .  Patient has a history of A-fib.  Past Medical History:  Diagnosis Date   Atrial fibrillation with RVR (HCC)    Gastric ulcer    Gout    Hypertension    Paroxysmal A-fib (HCC)    Sleep apnea    USES CPAP   Wears glasses     Past Surgical History:  Procedure Laterality Date   ATRIAL FIBRILLATION ABLATION N/A 04/26/2018   Procedure: ATRIAL FIBRILLATION ABLATION;  Surgeon: Inocencio Soyla Lunger, MD;  Location: [facility] INVASIVE CV LAB;  Service: Cardiovascular;  Laterality: N/A;   ATRIAL FIBRILLATION ABLATION N/A 11/07/2019   Procedure: ATRIAL FIBRILLATION ABLATION;  Surgeon: Inocencio Soyla Lunger, MD;  Location: [facility] INV[facility]IVE CV [facility];  Service: Cardiovascular;  Laterality: N/A;   CARDIOVERSION N/A 01/18/2018   Procedure: CARDIOVERSION;  Surgeon: Rolan Ezra RAMAN, MD;  Location: [facility] ENDOSCOPY;  Service: Cardiovascular;  Laterality: N/A;   CARDIOVERSION N/A 08/19/2019   Procedure: CARDIOVERSION;  Surgeon: Alveta Aleene PARAS, MD;  Location: [facility] ENDOSCOPY;  Service: Cardiovascular;  Laterality: N/A;   CARDIOVERSION N/A 04/18/2021   Procedure: CARDIOVERSION;  Surgeon: Alveta Aleene PARAS, MD;  Location: [facility] ENDOSCOPY;  Service: Cardiovascular;  Laterality: N/A;   ELBOW LIGAMENT RECONSTRUCTION Right 09/29/2014   Procedure: HECTOR FRACTURE FIXATION, POSSIBLE LIGAMENT REPAIR;  Surgeon: Eva Elsie Herring, MD;   Location: [facility];  Service: Orthopedics;  Laterality: Right;  Right elbow coranoid fracture fixation, possible ligament repair   LEFT HEART CATH AND CORONARY ANGIOGRAPHY N/A 03/31/2024   Procedure: LEFT HEART CATH AND CORONARY ANGIOGRAPHY;  Surgeon: Swaziland, Peter M, MD;  Location: [facility] INVASIVE CV LAB;  Service: Cardiovascular;  Laterality: N/A;   TEE WITHOUT CARDIOVERSION N/A 01/18/2018   Procedure: TRANSESOPHAGEAL ECHOCARDIOGRAM (TEE);  Surgeon: Rolan Ezra RAMAN, MD;  Location: [facility] ENDOSCOPY;  Service: Cardiovascular;  Laterality: N/A;   TEE WITHOUT CARDIOVERSION N/A 04/18/2021   Procedure: TRANSESOPHAGEAL ECHOCARDIOGRAM (TEE);  Surgeon: Alveta Aleene PARAS, MD;  Location: [facility] ENDOSCOPY;  Service: Cardiovascular;  Laterality: N/A;   WISDOM TOOTH EXTRACTION      Family History  Problem Relation Age of Onset   Hypertension Paternal Uncle    Hypertension Maternal Grandmother    Hypertension Paternal Uncle    Hypertension Paternal Uncle    Hypertension Paternal Uncle    Hypertension Paternal Uncle    Hypertension Mother    Stroke Father    Heart disease Father    Heart attack Father    Hypertension Father    Colon cancer Neg Hx    Social History:  reports that he quit smoking about 2 years ago. His smoking use included cigars. He has never used smokeless tobacco. He reports that he does not currently use alcohol. He reports that he does not use drugs.  Allergies: No Known Allergies  (Not in a hospital admission)   Results for orders placed or performed  during the hospital encounter of 04/28/24 (from the past 48 hours)  CBC with Differential     Status: Abnormal   Collection Time: 04/28/24  6:00 PM  Result Value Ref Range   WBC 10.9 (H) 4.0 - 10.5 K/uL   RBC 4.36 4.22 - 5.81 MIL/uL   Hemoglobin 12.8 (L) 13.0 - 17.0 g/dL   HCT 62.0 (L) 60.9 - 47.9 %   MCV 86.9 80.0 - 100.0 fL   MCH 29.4 26.0 - 34.0 pg   MCHC 33.8 30.0 - 36.0 g/dL   RDW 82.2 (H) 88.4 - 84.4 %    Platelets 259 150 - 400 K/uL   nRBC 0.0 0.0 - 0.2 %   Neutrophils Relative % 91 %   Neutro Abs 9.9 (H) 1.7 - 7.7 K/uL   Lymphocytes Relative 6 %   Lymphs Abs 0.6 (L) 0.7 - 4.0 K/uL   Monocytes Relative 3 %   Monocytes Absolute 0.4 0.1 - 1.0 K/uL   Eosinophils Relative 0 %   Eosinophils Absolute 0.0 0.0 - 0.5 K/uL   Basophils Relative 0 %   Basophils Absolute 0.0 0.0 - 0.1 K/uL   Immature Granulocytes 0 %   Abs Immature Granulocytes 0.03 0.00 - 0.07 K/uL    Comment: Performed at [facility], [address], [place], [place] [address]  Comprehensive metabolic panel     Status: Abnormal   Collection Time: 04/28/24  6:00 PM  Result Value Ref Range   Sodium 139 135 - 145 mmol/L   Potassium 4.2 3.5 - 5.1 mmol/L   Chloride 105 98 - 111 mmol/L   CO2 23 22 - 32 mmol/L   Glucose, Bld 109 (H) 70 - 99 mg/dL    Comment: Glucose reference range applies only to samples taken after fasting for at least 8 hours.   BUN 14 6 - 20 mg/dL   Creatinine, Ser 8.74 (H) 0.61 - 1.24 mg/dL   Calcium  8.1 (L) 8.9 - 10.3 mg/dL   Total Protein 6.3 (L) 6.5 - 8.1 g/dL   Albumin 3.5 3.5 - 5.0 g/dL   AST 19 15 - 41 U/L   ALT 22 0 - 44 U/L   Alkaline Phosphatase 98 38 - 126 U/L   Total Bilirubin 1.8 (H) 0.0 - 1.2 mg/dL   GFR, Estimated >39 >39 mL/min    Comment: (NOTE) Calculated using the CKD-EPI Creatinine Equation (2021)    Anion gap 11 5 - 15    Comment: Performed at [facility], [address], [place], [place] [address]  Lipase, blood     Status: None   Collection Time: 04/28/24  6:00 PM  Result Value Ref Range   Lipase 51 11 - 51 U/L    Comment: Performed at [facility], [address] [place], [place] [address]  Troponin I (High Sensitivity)     Status: None   Collection Time: 04/28/24  6:00 PM  Result Value Ref Range   Troponin I (High Sensitivity) 4 <18 ng/L    Comment: (NOTE) Elevated high sensitivity troponin I (hsTnI) values and significant  changes across serial  measurements may suggest ACS but many other  chronic and acute conditions are known to elevate hsTnI results.  Refer to the Links section for chest pain algorithms and additional  guidance. Performed at [facility], [address] [place], [place] [address]   I-stat chem 8, ED     Status: Abnormal   Collection Time: 04/28/24  6:08 PM  Result Value Ref Range   Sodium 141 135 - 145 mmol/L   Potassium 4.3 3.5 - 5.1 mmol/L   Chloride 107 98 - 111 mmol/L   BUN 15 6 - 20 mg/dL   Creatinine, Ser 8.79 0.61 - 1.24 mg/dL   Glucose, Bld 892 (H) 70 - 99 mg/dL    Comment: Glucose reference range applies only to samples taken after fasting for at least 8 hours.   Calcium , Ion 1.07 (L) 1.15 - 1.40 mmol/L   TCO2 24 22 - 32 mmol/L   Hemoglobin 13.3 13.0 - 17.0 g/dL   HCT 60.9 60.9 - 47.9 %  I-Stat Lactic Acid     Status: None   Collection Time: 04/28/24  6:08 PM  Result Value Ref Range   Lactic Acid, Venous 0.9 0.5 - 1.9 mmol/L  I-Stat Lactic Acid     Status: None   Collection Time: 04/28/24  8:20 PM  Result Value Ref Range   Lactic Acid, Venous 1.0 0.5 - 1.9 mmol/L   CT Angio Chest/Abd/Pel for Dissection W and/or Wo Contrast Result Date: 04/28/2024 CLINICAL DATA:  Acute aortic syndrome (AAS) suspected abdominal pain all over ,no vomiting, no diarrhea - no recent abdominal surgery EXAM: CT ANGIOGRAPHY CHEST, ABDOMEN AND PELVIS TECHNIQUE: Non-contrast CT of the chest was initially obtained. Multidetector CT imaging through the chest, abdomen and pelvis was performed using the standard protocol during bolus administration of intravenous contrast. Multiplanar reconstructed images and MIPs were obtained and reviewed to evaluate the vascular anatomy. RADIATION DOSE REDUCTION: This exam was performed according to the departmental dose-optimization program which includes automated exposure control, adjustment of the mA and/or kV according to patient size and/or use of iterative reconstruction  technique. CONTRAST:  OMNIPAQUE  IOHEXOL  350 MG/ML SOLN COMPARISON:  CT abdomen pelvis 07/14/2020 FINDINGS: CTA CHEST FINDINGS Cardiovascular: Preferential opacification of the thoracic aorta. No evidence of thoracic aortic aneurysm or dissection. Normal heart size. No significant pericardial effusion. The thoracic aorta is normal in caliber. No atherosclerotic plaque of the thoracic aorta. No coronary artery calcifications. Mediastinum/Nodes: No enlarged mediastinal, hilar, or axillary lymph nodes. Thyroid  gland, trachea, and esophagus demonstrate no significant findings. Lungs/Pleura: Bibasilar atelectasis. No focal consolidation. No pulmonary nodule. No pulmonary mass. No pleural effusion. No pneumothorax. Musculoskeletal: No chest wall abnormality. No suspicious lytic or blastic osseous lesions. No acute displaced fracture. Review of the MIP images confirms the above findings. CTA ABDOMEN AND PELVIS FINDINGS VASCULAR Aorta: Mild atherosclerotic plaque. Normal caliber aorta without aneurysm, dissection, vasculitis or significant stenosis. Celiac: Patent without evidence of aneurysm, dissection, vasculitis or significant stenosis. SMA: Patent without evidence of aneurysm, dissection, vasculitis or significant stenosis. Renals: Both renal arteries are patent without evidence of aneurysm, dissection, vasculitis, fibromuscular dysplasia or significant stenosis. IMA: Patent without evidence of aneurysm, dissection, vasculitis or significant stenosis. Inflow: Mild atherosclerotic plaque. Patent without evidence of aneurysm, dissection, vasculitis or significant stenosis. Veins: No obvious venous abnormality within the limitations of this arterial phase study. Review of the MIP images confirms the above findings. NON-VASCULAR Hepatobiliary: No focal liver abnormality. Layering hyperdensity within the gallbladder lumen may represent cholelithiasis versus gallbladder sludge. No gallbladder wall thickening or  pericholecystic fluid. No biliary dilatation. Pancreas: No focal lesion. Normal pancreatic contour. No surrounding inflammatory changes. No main pancreatic ductal dilatation. Spleen: Normal in size without focal abnormality. Adrenals/Urinary Tract: No adrenal nodule bilaterally. Bilateral kidneys enhance symmetrically. No hydronephrosis. No hydroureter. The urinary bladder is unremarkable. Stomach/Bowel: Roux-en-Y gastric bypass surgical changes. Stomach  is within normal limits. Long segment jejunum biliopancreatic limb as well as common channel demonstrating circumferential marked bowel wall thickening (5:215). No pneumatosis. No small bowel dilatation. No definite swirling of the mesentery. No mesenteric edema. No evidence of large bowel wall thickening or dilatation. Appendix appears normal. Lymphatic: No lymphadenopathy. Reproductive: Prostate is enlarged measuring up to 5.2 cm. Other: Trace to small volume free intraperitoneal gas. Small volume free fluid. Layering hyperdensity within the pelvis adjacent to the rectum and prostate of unclear etiology (5:295). No organized fluid collection. Musculoskeletal: No abdominal wall hernia or abnormality. No suspicious lytic or blastic osseous lesions. No acute displaced fracture. 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 Review of the MIP images confirms the above findings. IMPRESSION: 1. No acute thoracic or abdominal aorta abnormality. 2. 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 3. Trace to small volume pneumoperitoneum and small volume free fluid with layering hyperdensity within the pelvis suggestive of extravasated PO contrast of unclear etiology. Concern for bowel perforation with exact origin unclear. If patient is stable, recommend repeat CT abdomen pelvis with use of PO contrast.  4. Cholelithiasis versus gallbladder sludge with no CT evidence of acute cholecystitis. 5. Prostatomegaly. These results were called by telephone at the time of interpretation on 04/28/2024 at 7:46 pm to provider JOSHUA LONG , who verbally acknowledged these results. Electronically Signed   By: Morgane  Naveau M.D.   On: 04/28/2024 19:48    Review of Systems  Constitutional:  Negative for chills and fever.  HENT:  Negative for ear discharge, hearing loss and sore throat.   Eyes:  Negative for discharge.  Respiratory:  Negative for cough and shortness of breath.   Cardiovascular:  Negative for chest pain and leg swelling.  Gastrointestinal:  Positive for abdominal pain and nausea. Negative for constipation, diarrhea and vomiting.  Musculoskeletal:  Negative for myalgias and neck pain.  Skin:  Negative for rash.  Allergic/Immunologic: Negative for environmental allergies.  Neurological:  Negative for dizziness and seizures.  Hematological:  Does not bruise/bleed easily.  Psychiatric/Behavioral:  Negative for suicidal ideas.   All other systems reviewed and are negative.   Blood pressure (!) 140/58, pulse 86, temperature 97.8 F (36.6 C), resp. rate (!) 31, height 6' 2 (1.88 m), weight 85.3 kg, SpO2 97%. Physical Exam Constitutional:      Appearance: He is well-developed.     Comments: Conversant No acute distress  HENT:     Head: Normocephalic and atraumatic.  Eyes:     General: Lids are normal. No scleral icterus.    Pupils: Pupils are equal, round, and reactive to light.     Comments: Pupils are equal round and reactive No lid lag Moist conjunctiva  Neck:     Thyroid : No thyromegaly.     Trachea: No tracheal tenderness.     Comments: No cervical lymphadenopathy Cardiovascular:     Rate and Rhythm: Normal rate and regular rhythm.     Heart sounds: No murmur heard. Pulmonary:     Effort: Pulmonary effort is normal.     Breath sounds: Normal breath sounds. No wheezing or  rales.  Abdominal:     Tenderness: There is generalized abdominal tenderness. There is no guarding or rebound.     Hernia: No hernia is present.  Musculoskeletal:     Cervical back: Normal range of motion and neck supple.  Skin:    General: Skin is warm.     Findings: No rash.     Nails: There is no clubbing.     Comments: Normal skin turgor  Neurological:     Mental Status: He is alert and oriented to person, place, and time.     Comments: Normal gait and station  Psychiatric:        Mood and Affect: Mood normal.        Thought Content: Thought content normal.        Judgment: Judgment normal.     Comments: Appropriate affect      Assessment/Plan 48 year old male with pneumoperitoneum status post Roux-en-Y bypass 2 years ago.  Patient with jejunal thickening. Patient on Xarelto  for recent MI, AFIB  1.  We will reverse patient with Kcentra  2.  Will proceed to the operating room urgently for diagnostic laparoscopy.  I discussed with the patient and his wife the risks and benefits of the procedure to include Brilinta, infection, bleeding, damage to surrounding structures, possible need for further surgery.  Patient voiced understanding wished to proceed.  Lynda Leos, MD 04/28/2024, 9:05 PM

## 2024-04-29 ENCOUNTER — Telehealth: Payer: Self-pay | Admitting: *Deleted

## 2024-04-29 ENCOUNTER — Encounter (HOSPITAL_COMMUNITY): Payer: Self-pay | Admitting: General Surgery

## 2024-04-29 DIAGNOSIS — N179 Acute kidney failure, unspecified: Secondary | ICD-10-CM | POA: Diagnosis not present

## 2024-04-29 LAB — CBC
HCT: 40.7 % (ref 39.0–52.0)
Hemoglobin: 13.9 g/dL (ref 13.0–17.0)
MCH: 29.6 pg (ref 26.0–34.0)
MCHC: 34.2 g/dL (ref 30.0–36.0)
MCV: 86.8 fL (ref 80.0–100.0)
Platelets: 239 K/uL (ref 150–400)
RBC: 4.69 MIL/uL (ref 4.22–5.81)
RDW: 18.1 % — ABNORMAL HIGH (ref 11.5–15.5)
WBC: 6.8 K/uL (ref 4.0–10.5)
nRBC: 0 % (ref 0.0–0.2)

## 2024-04-29 LAB — BASIC METABOLIC PANEL WITH GFR
Anion gap: 11 (ref 5–15)
BUN: 18 mg/dL (ref 6–20)
CO2: 26 mmol/L (ref 22–32)
Calcium: 7.6 mg/dL — ABNORMAL LOW (ref 8.9–10.3)
Chloride: 104 mmol/L (ref 98–111)
Creatinine, Ser: 1.48 mg/dL — ABNORMAL HIGH (ref 0.61–1.24)
GFR, Estimated: 58 mL/min — ABNORMAL LOW (ref >60)
Glucose, Bld: 151 mg/dL — ABNORMAL HIGH (ref 70–99)
Potassium: 4.3 mmol/L (ref 3.5–5.1)
Sodium: 141 mmol/L (ref 135–145)

## 2024-04-29 LAB — HEPARIN LEVEL (UNFRACTIONATED): Heparin Unfractionated: <0.1 [IU]/mL — ABNORMAL LOW (ref 0.30–0.70)

## 2024-04-29 LAB — APTT: aPTT: 43 s — ABNORMAL HIGH (ref 24–36)

## 2024-04-29 MED ORDER — ENOXAPARIN SODIUM 40 MG/0.4ML IJ SOSY: 40.0000 mg | PREFILLED_SYRINGE | INTRAMUSCULAR | Status: DC

## 2024-04-29 MED ORDER — PIPERACILLIN-TAZOBACTAM 3.375 G IVPB
3.3750 g | Freq: Three times a day (TID) | INTRAVENOUS | Status: AC
  Administered 2024-04-29 – 2024-05-03 (×15): 3.375 g via INTRAVENOUS
  Filled 2024-04-29 (×15): qty 50

## 2024-04-29 MED ORDER — HEPARIN (PORCINE) 25000 UT/250ML-% IV SOLN
2400.0000 [IU]/h | INTRAVENOUS | Status: DC
  Administered 2024-04-29: 1000 [IU]/h via INTRAVENOUS
  Administered 2024-04-30: 1550 [IU]/h via INTRAVENOUS
  Administered 2024-05-01 – 2024-05-06 (×10): 2600 [IU]/h via INTRAVENOUS
  Filled 2024-04-29 (×13): qty 250

## 2024-04-29 MED ORDER — ONDANSETRON HCL 4 MG/2ML IJ SOLN: 4.0000 mg | Freq: Four times a day (QID) | INTRAMUSCULAR | Status: DC | PRN

## 2024-04-29 MED ORDER — PANTOPRAZOLE SODIUM 40 MG PO TBEC
40.0000 mg | DELAYED_RELEASE_TABLET | Freq: Every day | ORAL | Status: DC
  Filled 2024-04-29: qty 1

## 2024-04-29 MED ORDER — HYDROMORPHONE HCL 1 MG/ML IJ SOLN
2.0000 mg | INTRAMUSCULAR | Status: DC | PRN
  Administered 2024-04-29 – 2024-04-30 (×8): 2 mg via INTRAVENOUS
  Filled 2024-04-29 (×8): qty 2

## 2024-04-29 MED ORDER — ONDANSETRON 4 MG PO TBDP
4.0000 mg | ORAL_TABLET | Freq: Four times a day (QID) | ORAL | Status: DC | PRN
Start: 2024-04-29 — End: 2024-05-06

## 2024-04-29 MED ORDER — FLUCONAZOLE IN SODIUM CHLORIDE 400-0.9 MG/200ML-% IV SOLN
400.0000 mg | INTRAVENOUS | Status: DC
  Administered 2024-04-29 – 2024-05-06 (×8): 400 mg via INTRAVENOUS
  Filled 2024-04-29 (×10): qty 200

## 2024-04-29 MED ORDER — LIDOCAINE 5 % EX PTCH
2.0000 | MEDICATED_PATCH | CUTANEOUS | Status: DC
  Administered 2024-04-29 – 2024-05-05 (×7): 2 via TRANSDERMAL
  Filled 2024-04-29 (×7): qty 2

## 2024-04-29 MED ORDER — THIAMINE HCL 100 MG/ML IJ SOLN
100.0000 mg | Freq: Every day | INTRAMUSCULAR | Status: DC
  Administered 2024-04-29 – 2024-05-05 (×7): 100 mg via INTRAVENOUS
  Filled 2024-04-29 (×7): qty 2

## 2024-04-29 MED ORDER — DEXTROSE-SODIUM CHLORIDE 5-0.9 % IV SOLN: INTRAVENOUS | Status: AC

## 2024-04-29 MED ORDER — PANTOPRAZOLE SODIUM 40 MG IV SOLR
40.0000 mg | Freq: Two times a day (BID) | INTRAVENOUS | Status: DC
  Administered 2024-04-29 – 2024-05-05 (×14): 40 mg via INTRAVENOUS
  Filled 2024-04-29 (×14): qty 10

## 2024-04-29 MED ORDER — KETOROLAC TROMETHAMINE 15 MG/ML IJ SOLN
15.0000 mg | Freq: Four times a day (QID) | INTRAMUSCULAR | Status: DC | PRN
  Administered 2024-04-29 (×2): 15 mg via INTRAVENOUS
  Filled 2024-04-29 (×2): qty 1

## 2024-04-29 MED ORDER — HYDROMORPHONE HCL 2 MG/ML IJ SOLN: 2.0000 mg | INTRAMUSCULAR | Status: DC | PRN

## 2024-04-29 MED ORDER — CALCIUM GLUCONATE-NACL 1-0.675 GM/50ML-% IV SOLN
1.0000 g | Freq: Once | INTRAVENOUS | Status: AC
  Administered 2024-04-29: 1000 mg via INTRAVENOUS
  Filled 2024-04-29: qty 50

## 2024-04-29 MED ORDER — ACETAMINOPHEN 10 MG/ML IV SOLN
1000.0000 mg | Freq: Four times a day (QID) | INTRAVENOUS | Status: AC
  Administered 2024-04-29 – 2024-04-30 (×4): 1000 mg via INTRAVENOUS
  Filled 2024-04-29 (×4): qty 100

## 2024-04-29 MED ORDER — SUGAMMADEX SODIUM 200 MG/2ML IV SOLN
INTRAVENOUS | Status: DC | PRN
  Administered 2024-04-28: 200 mg via INTRAVENOUS

## 2024-04-29 NOTE — Transfer of Care (Signed)
 Immediate Anesthesia Transfer of Care Note  Patient: Samuel Flynn [facility]  Procedure(s) Performed: LAPAROSCOPY, DIAGNOSTIC (Abdomen) LAPAROSCOPIC GASTRIC J JUNOSTOMY (Abdomen)  Patient Location: PACU  Anesthesia Type:General  Level of Consciousness: awake and alert   Airway & Oxygen  Therapy: Patient Spontanous Breathing and Patient connected to nasal cannula oxygen   Post-op Assessment: Report given to RN and Post -op Vital signs reviewed and stable  Post vital signs: Reviewed and stable  Last Vitals:  Vitals Value Taken Time  BP 147/77 04/29/24 00:03  Temp    Pulse 74 04/29/24 00:07  Resp 10 04/29/24 00:07  SpO2 97 % 04/29/24 00:07  Vitals shown include unfiled device data.  Last Pain:  Vitals:   04/28/24 2129  TempSrc: Oral  PainSc:          Complications: No notable events documented.

## 2024-04-29 NOTE — Progress Notes (Signed)
 Progress Note  1 Day Post-Op  Subjective: Patient reports abdominal soreness. Overall abdominal pain has improved from initial admission. Reports some nausea. Denies vomiting. Has not had bowel movement yet. Denies flatulence.  ROS  All negative with the exception of above.  Objective: Vital signs in last 24 hours: Temp:  [97.4 F (36.3 C)-98.5 F (36.9 C)] 97.7 F (36.5 C) (08/26 0808) Pulse Rate:  [53-88] 53 (08/26 0808) Resp:  [11-31] 17 (08/26 0808) BP: (112-155)/(58-87) 112/71 (08/26 0808) SpO2:  [96 %-100 %] 99 % (08/26 0808) Weight:  [85.3 kg] 85.3 kg (08/25 1934) Last BM Date : 04/27/24  Intake/Output from previous day: 08/25 0701 - 08/26 0700 In: 1915.2 [I.V.:1700; IV Piggyback:215.2] Out: 995 [Urine:750; Drains:225; Blood:20] Intake/Output this shift: Total I/O In: 0  Out: 550 [Drains:550]  PE: General: Pleasant, male who is laying in bed in NAD. HEENT: Head is normocephalic, atraumatic.  Sclera are noninjected. Conjunctiva anicteric. EOMI.  Lungs: Respiratory effort nonlabored Abd: Soft with some distention. Generalized tenderness to palpation that is more prominent around right drain. Right drain in place. Bulb noted to have thin, pink, milky fluid. Over 100 noted. Psych: A&Ox3 with an appropriate affect.    Lab Results:  Recent Labs    04/28/24 1800 04/28/24 1808 04/29/24 0626  WBC 10.9*  --  6.8  HGB 12.8* 13.3 13.9  HCT 37.9* 39.0 40.7  PLT 259  --  239   BMET Recent Labs    04/28/24 1800 04/28/24 1808 04/29/24 0626  NA 139 141 141  K 4.2 4.3 4.3  CL 105 107 104  CO2 23  --  26  GLUCOSE 109* 107* 151*  BUN 14 15 18   CREATININE 1.25* 1.20 1.48*  CALCIUM  8.1*  --  7.6*   PT/INR No results for input(s): LABPROT, INR in the last 72 hours. CMP     Component Value Date/Time   NA 141 04/29/2024 0626   NA 141 02/06/2024 1614   K 4.3 04/29/2024 0626   CL 104 04/29/2024 0626   CO2 26 04/29/2024 0626   GLUCOSE 151 (H) 04/29/2024  0626   BUN 18 04/29/2024 0626   BUN 11 02/06/2024 1614   CREATININE 1.48 (H) 04/29/2024 0626   CALCIUM  7.6 (L) 04/29/2024 0626   PROT 6.3 (L) 04/28/2024 1800   PROT 6.4 02/06/2024 1614   ALBUMIN 3.5 04/28/2024 1800   ALBUMIN 4.1 02/06/2024 1614   AST 19 04/28/2024 1800   ALT 22 04/28/2024 1800   ALKPHOS 98 04/28/2024 1800   BILITOT 1.8 (H) 04/28/2024 1800   BILITOT 0.9 02/06/2024 1614   GFRNONAA 58 (L) 04/29/2024 0626   GFRAA >60 01/28/2020 1946   Lipase     Component Value Date/Time   LIPASE 51 04/28/2024 1800       Studies/Results: CT Angio Chest/Abd/Pel for Dissection W and/or Wo Contrast Result Date: 04/28/2024 CLINICAL DATA:  Acute aortic syndrome (AAS) suspected abdominal pain all over ,no vomiting, no diarrhea - no recent abdominal surgery EXAM: CT ANGIOGRAPHY CHEST, ABDOMEN AND PELVIS TECHNIQUE: Non-contrast CT of the chest was initially obtained. Multidetector CT imaging through the chest, abdomen and pelvis was performed using the standard protocol during bolus administration of intravenous contrast. Multiplanar reconstructed images and MIPs were obtained and reviewed to evaluate the vascular anatomy. RADIATION DOSE REDUCTION: This exam was performed according to the departmental dose-optimization program which includes automated exposure control, adjustment of the mA and/or kV according to patient size and/or use of iterative reconstruction technique. CONTRAST:  OMNIPAQUE  IOHEXOL  350 MG/ML SOLN COMPARISON:  CT abdomen pelvis 07/14/2020 FINDINGS: CTA CHEST FINDINGS Cardiovascular: Preferential opacification of the thoracic aorta. No evidence of thoracic aortic aneurysm or dissection. Normal heart size. No significant pericardial effusion. The thoracic aorta is normal in caliber. No atherosclerotic plaque of the thoracic aorta. No coronary artery calcifications. Mediastinum/Nodes: No enlarged mediastinal, hilar, or axillary lymph nodes. Thyroid  gland, trachea, and esophagus  demonstrate no significant findings. Lungs/Pleura: Bibasilar atelectasis. No focal consolidation. No pulmonary nodule. No pulmonary mass. No pleural effusion. No pneumothorax. Musculoskeletal: No chest wall abnormality. No suspicious lytic or blastic osseous lesions. No acute displaced fracture. Review of the MIP images confirms the above findings. CTA ABDOMEN AND PELVIS FINDINGS VASCULAR Aorta: Mild atherosclerotic plaque. Normal caliber aorta without aneurysm, dissection, vasculitis or significant stenosis. Celiac: Patent without evidence of aneurysm, dissection, vasculitis or significant stenosis. SMA: Patent without evidence of aneurysm, dissection, vasculitis or significant stenosis. Renals: Both renal arteries are patent without evidence of aneurysm, dissection, vasculitis, fibromuscular dysplasia or significant stenosis. IMA: Patent without evidence of aneurysm, dissection, vasculitis or significant stenosis. Inflow: Mild atherosclerotic plaque. Patent without evidence of aneurysm, dissection, vasculitis or significant stenosis. Veins: No obvious venous abnormality within the limitations of this arterial phase study. Review of the MIP images confirms the above findings. NON-VASCULAR Hepatobiliary: No focal liver abnormality. Layering hyperdensity within the gallbladder lumen may represent cholelithiasis versus gallbladder sludge. No gallbladder wall thickening or pericholecystic fluid. No biliary dilatation. Pancreas: No focal lesion. Normal pancreatic contour. No surrounding inflammatory changes. No main pancreatic ductal dilatation. Spleen: Normal in size without focal abnormality. Adrenals/Urinary Tract: No adrenal nodule bilaterally. Bilateral kidneys enhance symmetrically. No hydronephrosis. No hydroureter. The urinary bladder is unremarkable. Stomach/Bowel: Roux-en-Y gastric bypass surgical changes. Stomach is within normal limits. Long segment jejunum biliopancreatic limb as well as common channel  demonstrating circumferential marked bowel wall thickening (5:215). No pneumatosis. No small bowel dilatation. No definite swirling of the mesentery. No mesenteric edema. No evidence of large bowel wall thickening or dilatation. Appendix appears normal. Lymphatic: No lymphadenopathy. Reproductive: Prostate is enlarged measuring up to 5.2 cm. Other: Trace to small volume free intraperitoneal gas. Small volume free fluid. Layering hyperdensity within the pelvis adjacent to the rectum and prostate of unclear etiology (5:295). No organized fluid collection. Musculoskeletal: No abdominal wall hernia or abnormality. No suspicious lytic or blastic osseous lesions. No acute displaced fracture. 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 Review of the MIP images confirms the above findings. IMPRESSION: 1. No acute thoracic or abdominal aorta abnormality. 2. 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 3. Trace to small volume pneumoperitoneum and small volume free fluid with layering hyperdensity within the pelvis suggestive of extravasated PO contrast of unclear etiology. Concern for bowel perforation with exact origin unclear. If patient is stable, recommend repeat CT abdomen pelvis with use of PO contrast. 4. Cholelithiasis versus gallbladder sludge with no CT evidence of acute cholecystitis. 5. Prostatomegaly. These results were called by telephone at the time of interpretation on 04/28/2024 at 7:46 pm to provider JOSHUA LONG , who verbally acknowledged these results. Electronically Signed   By: Morgane  Naveau M.D.   On: 04/28/2024 19:48    Anti-infectives: Anti-infectives (From admission, onward)    Start     Dose/Rate Route Frequency Ordered Stop   04/29/24 0215  piperacillin -tazobactam (ZOSYN ) IVPB 3.375 g  3.375 g 12.5 mL/hr  over 240 Minutes Intravenous Every 8 hours 04/29/24 0122 05/04/24 0559   04/29/24 0115  fluconazole  (DIFLUCAN ) IVPB 400 mg        400 mg 100 mL/hr over 120 Minutes Intravenous Every 24 hours 04/29/24 0111     04/28/24 2000  piperacillin -tazobactam (ZOSYN ) IVPB 3.375 g        3.375 g 100 mL/hr over 30 Minutes Intravenous  Once 04/28/24 1956 04/28/24 2117        Assessment/Plan POD1: S/P Laparoscopy, diagnostic laparoscopic Arlyss patch repair placement of 19 French Blake drain by Dr. Rubin on 04/28/2024 due to free air of abdomen/perforated marginal ulcer at the gastrojejunostomy -Afebrile. -Creatinine 1.48 from 1.20 -WBC 6.8; HGB 13.9 -Generalized tenderness on exam with thin, pink, milking fluid noted in drain bulb -Continue NPO, Plan upper GI POD4 -Continue  IV abx, Will plan for 4 days post-op -Continue PPI IV BID -Ordered H.Pylori testing -Initiate IV thiamine . -Stopped NSAIDs. Initiated acetaminophen    FEN: NPO VTE: SCDs, Will discuss with MD about Heparin  drip without bolus. 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   Marjorie Carlyon Favre, [facility] Surgery 04/29/2024, 10:48 AM Please see Amion for pager number during day hours 7:00am-4:30pm

## 2024-04-29 NOTE — Anesthesia Postprocedure Evaluation (Signed)
 Anesthesia Post Note  Patient: [name] [facility]  Procedure(s) Performed: LAPAROSCOPY, DIAGNOSTIC (Abdomen) LAPAROSCOPIC GASTRIC J JUNOSTOMY (Abdomen)     Patient location during evaluation: PACU Anesthesia Type: General Level of consciousness: awake and alert Pain management: pain level controlled Vital Signs Assessment: post-procedure vital signs reviewed and stable Respiratory status: spontaneous breathing, nonlabored ventilation, respiratory function stable and patient connected to nasal cannula oxygen  Cardiovascular status: blood pressure returned to baseline and stable Postop Assessment: no apparent nausea or vomiting Anesthetic complications: no   No notable events documented.  Last Vitals:  Vitals:   04/29/24 0015 04/29/24 0030  BP: (!) 146/77 138/73  Pulse: 76 76  Resp: 11 12  Temp:    SpO2: 96% 96%    Last Pain:  Vitals:   04/29/24 0030  TempSrc:   PainSc: Asleep                 [name]

## 2024-04-29 NOTE — Plan of Care (Signed)

## 2024-04-29 NOTE — Consult Note (Signed)
 Medical Consultation   Samuel Flynn  FMW:[id number]  DOB: 10/13/1975  DOA: 04/28/2024  PCP: [name], MD Outpatient Specialists:    Requesting physician: Ozell Shaper, PA-C/CCS MD  Reason for consultation: Assistance with evaluation and management of AKI and multiple medical problems   History of Present Illness: Samuel Flynn is an 48 y.o. 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, truck driver by occupation, medical history significant for previous Roux-en-Y bypass in 2023, paroxysmal A-fib/flutter, failed ablations x 2, plan for third ablation on 06/30/2024, now on Xarelto  since 04/07/24, chronic sinus bradycardia, HTN, OSA on CPAP, hypocalcemia, hospitalized 03/31/2024 - 04/01/2024 for complaints of chest pain, palpitations and admitted for NSTEMI, LHC showed mild nonobstructive CAD, medical therapy was recommended, echo showed LVEF of 60 to 65% with no wall motion abnormalities, since then has followed up with EP cardiology on 04/22/2024, presented to ED on 8/25 with complaints of diffuse abdominal pain.  He was taken urgently to the OR and underwent laparoscopic, Arlyss patch repair of perforated marginal ulcer at the gastrojejunostomy.  Postop day 1 today, reports appropriate abdominal pain 9/10 in intensity, diffuse, improved after pain meds.  Asking for water to drink.  Denies any other complaints.  Specifically denies chest pain, dyspnea, palpitations, dizziness or lightheadedness.  Labs: Creatinine has gone up from 1.25 on admission to 1.48.  Serial HS Troponin x 2 negative.  Venous lactate x 2 negative.  CBC normal.  Lipase normal.  Review of Systems:  ROS All other systems reviewed and apart from HPI, all others are negative.  Past Medical History: Past Medical History:  Diagnosis Date   Atrial fibrillation with RVR (HCC)    Gastric ulcer    Gout    Hypertension    Paroxysmal A-fib (HCC)    Sleep apnea    USES CPAP   Wears glasses     Past  Surgical History: Past Surgical History:  Procedure Laterality Date   ATRIAL FIBRILLATION ABLATION N/A 04/26/2018   Procedure: ATRIAL FIBRILLATION ABLATION;  Surgeon: Inocencio Soyla Lunger, MD;  [facility]: [facility];  Service: Cardiovascular;  Laterality: N/A;   ATRIAL FIBRILLATION ABLATION N/A 11/07/2019   Procedure: ATRIAL FIBRILLATION ABLATION;  Surgeon: Inocencio Soyla Lunger, MD;  Location: [facility];  Service: Cardiovascular;  Laterality: N/A;   CARDIOVERSION N/A 01/18/2018   Procedure: CARDIOVERSION;  Surgeon: Rolan Ezra RAMAN, MD;  Location: [facility] ENDOSCOPY;  Service: Cardiovascular;  Laterality: N/A;   CARDIOVERSION N/A 08/19/2019   Procedure: CARDIOVERSION;  Surgeon: Alveta Aleene PARAS, MD;  Location: [facility] ENDOSCOPY;  Service: Cardiovascular;  Laterality: N/A;   CARDIOVERSION N/A 04/18/2021   Procedure: CARDIOVERSION;  Surgeon: Alveta Aleene PARAS, MD;  Location: [facility] ENDOSCOPY;  Service: Cardiovascular;  Laterality: N/A;   ELBOW LIGAMENT RECONSTRUCTION Right 09/29/2014   Procedure: HECTOR FRACTURE FIXATION, POSSIBLE LIGAMENT REPAIR;  Surgeon: Eva Elsie Herring, MD;  Location: [facility];  Service: Orthopedics;  Laterality: Right;  Right elbow coranoid fracture fixation, possible ligament repair   HIATAL HERNIA REPAIR  04/28/2024   Procedure: LAPAROSCOPIC GASTRIC PARAS SPALDING;  Surgeon: Rubin Calamity, MD;  Location: [facility] OR;  Service: General;;   LAPAROSCOPY N/A 04/28/2024   Procedure: LAPAROSCOPY, DIAGNOSTIC;  Surgeon: Rubin Calamity, MD;  Location: [facility] OR;  Service: General;  Laterality: N/A;   LEFT HEART CATH AND CORONARY ANGIOGRAPHY N/A 03/31/2024   Procedure: LEFT HEART CATH AND CORONARY ANGIOGRAPHY;  Surgeon: Swaziland, Peter M, MD;  Location: [facility];  Service: Cardiovascular;  Laterality: N/A;   TEE WITHOUT CARDIOVERSION N/A 01/18/2018   Procedure: TRANSESOPHAGEAL ECHOCARDIOGRAM (TEE);  Surgeon: Rolan Ezra RAMAN, MD;  Location: [facility] ENDOSCOPY;  Service:  Cardiovascular;  Laterality: N/A;   TEE WITHOUT CARDIOVERSION N/A 04/18/2021   Procedure: TRANSESOPHAGEAL ECHOCARDIOGRAM (TEE);  Surgeon: Alveta Aleene PARAS, MD;  Location: [facility] ENDOSCOPY;  Service: Cardiovascular;  Laterality: N/A;   WISDOM TOOTH EXTRACTION       Allergies:  No Known Allergies   Social History:  reports that he quit smoking about 2 years ago. His smoking use included cigars. He has never used smokeless tobacco. He reports that he does not currently use alcohol. He reports that he does not use drugs.   Family History: Family History  Problem Relation Age of Onset   Hypertension Paternal Uncle    Hypertension Maternal Grandmother    Hypertension Paternal Uncle    Hypertension Paternal Uncle    Hypertension Paternal Uncle    Hypertension Paternal Uncle    Hypertension Mother    Stroke Father    Heart disease Father    Heart attack Father    Hypertension Father    Colon cancer Neg Hx       Physical Exam: Vitals:   04/29/24 0114 04/29/24 0120 04/29/24 0449 04/29/24 0808  BP: (!) 145/85 (!) 145/85 124/72 112/71  Pulse: 79 61 65 (!) 53  Resp: 17   17  Temp: 98.5 F (36.9 C) 98.5 F (36.9 C) (!) 97.4 F (36.3 C) 97.7 F (36.5 C)  TempSrc: Oral Oral Oral Oral  SpO2: 98% 98% 98% 99%  Weight:      Height:        Constitutional: Young male, moderately built and nourished lying propped up in bed without distress.  Oral mucosa with borderline hydration. Eyes: PERLA, EOMI, irises appear normal, anicteric sclera,  ENMT: external ears and nose appear normal, hearing normal, Lips appears normal, oropharynx mucosa, tongue, posterior pharynx appear normal  Neck: neck appears normal, no masses, normal ROM, no thyromegaly, no JVD  CVS: S1-S2 clear, RRR, no murmur rubs or gallops, no LE edema, normal pedal pulses  Respiratory:  clear to auscultation bilaterally, no wheezing, rales or rhonchi. Respiratory effort normal. No accessory muscle use.  Abdomen: Nondistended.   Surgical site dressings clean and dry.  Has drain with JP bulb having small amount of minimally bloody drainage.  Normal bowel sounds are heard. Musculoskeletal: : no cyanosis, clubbing or edema noted bilaterally. Joint/bones/muscle exam, strength, contractures or atrophy Neuro: Cranial nerves II-XII intact, strength, sensation, reflexes Psych: judgement and insight appear normal, stable mood and affect, mental status Skin: no rashes or lesions or ulcers, no induration or nodules    Data reviewed:  I have personally reviewed following labs and imaging studies Labs:  CBC: Recent Labs  Lab 04/28/24 1800 04/28/24 1808 04/29/24 0626  WBC 10.9*  --  6.8  NEUTROABS 9.9*  --   --   HGB 12.8* 13.3 13.9  HCT 37.9* 39.0 40.7  MCV 86.9  --  86.8  PLT 259  --  239    Basic Metabolic Panel: Recent Labs  Lab 04/28/24 1800 04/28/24 1808 04/29/24 0626  NA 139 141 141  K 4.2 4.3 4.3  CL 105 107 104  CO2 23  --  26  GLUCOSE 109* 107* 151*  BUN 14 15 18   CREATININE 1.25* 1.20 1.48*  CALCIUM  8.1*  --  7.6*   GFR Estimated Creatinine  Clearance: 70.2 mL/min (A) (by C-G formula based on SCr of 1.48 mg/dL (H)). Liver Function Tests: Recent Labs  Lab 04/28/24 1800  AST 19  ALT 22  ALKPHOS 98  BILITOT 1.8*  PROT 6.3*  ALBUMIN 3.5   Recent Labs  Lab 04/28/24 1800  LIPASE 51    Microbiology No results found for this or any previous visit (from the past 240 hours).     Inpatient Medications:   Scheduled Meds:  pantoprazole  (PROTONIX ) IV  40 mg Intravenous Q12H   thiamine  (VITAMIN B1) injection  100 mg Intravenous Daily   Continuous Infusions:  acetaminophen  1,000 mg (04/29/24 1212)   dextrose  5 % and 0.9 % NaCl 100 mL/hr at 04/29/24 0233   fluconazole  (DIFLUCAN ) IV 400 mg (04/29/24 0235)   heparin  1,000 Units/hr (04/29/24 1342)   piperacillin -tazobactam (ZOSYN )  IV 3.375 g (04/29/24 1343)     Radiological Exams on Admission: CT Angio Chest/Abd/Pel for Dissection W  and/or Wo Contrast Result Date: 04/28/2024 CLINICAL DATA:  Acute aortic syndrome (AAS) suspected abdominal pain all over ,no vomiting, no diarrhea - no recent abdominal surgery EXAM: CT ANGIOGRAPHY CHEST, ABDOMEN AND PELVIS TECHNIQUE: Non-contrast CT of the chest was initially obtained. Multidetector CT imaging through the chest, abdomen and pelvis was performed using the standard protocol during bolus administration of intravenous contrast. Multiplanar reconstructed images and MIPs were obtained and reviewed to evaluate the vascular anatomy. RADIATION DOSE REDUCTION: This exam was performed according to the departmental dose-optimization program which includes automated exposure control, adjustment of the mA and/or kV according to patient size and/or use of iterative reconstruction technique. CONTRAST:  OMNIPAQUE  IOHEXOL  350 MG/ML SOLN COMPARISON:  CT abdomen pelvis 07/14/2020 FINDINGS: CTA CHEST FINDINGS Cardiovascular: Preferential opacification of the thoracic aorta. No evidence of thoracic aortic aneurysm or dissection. Normal heart size. No significant pericardial effusion. The thoracic aorta is normal in caliber. No atherosclerotic plaque of the thoracic aorta. No coronary artery calcifications. Mediastinum/Nodes: No enlarged mediastinal, hilar, or axillary lymph nodes. Thyroid  gland, trachea, and esophagus demonstrate no significant findings. Lungs/Pleura: Bibasilar atelectasis. No focal consolidation. No pulmonary nodule. No pulmonary mass. No pleural effusion. No pneumothorax. Musculoskeletal: No chest wall abnormality. No suspicious lytic or blastic osseous lesions. No acute displaced fracture. Review of the MIP images confirms the above findings. CTA ABDOMEN AND PELVIS FINDINGS VASCULAR Aorta: Mild atherosclerotic plaque. Normal caliber aorta without aneurysm, dissection, vasculitis or significant stenosis. Celiac: Patent without evidence of aneurysm, dissection, vasculitis or significant  stenosis. SMA: Patent without evidence of aneurysm, dissection, vasculitis or significant stenosis. Renals: Both renal arteries are patent without evidence of aneurysm, dissection, vasculitis, fibromuscular dysplasia or significant stenosis. IMA: Patent without evidence of aneurysm, dissection, vasculitis or significant stenosis. Inflow: Mild atherosclerotic plaque. Patent without evidence of aneurysm, dissection, vasculitis or significant stenosis. Veins: No obvious venous abnormality within the limitations of this arterial phase study. Review of the MIP images confirms the above findings. NON-VASCULAR Hepatobiliary: No focal liver abnormality. Layering hyperdensity within the gallbladder lumen may represent cholelithiasis versus gallbladder sludge. No gallbladder wall thickening or pericholecystic fluid. No biliary dilatation. Pancreas: No focal lesion. Normal pancreatic contour. No surrounding inflammatory changes. No main pancreatic ductal dilatation. Spleen: Normal in size without focal abnormality. Adrenals/Urinary Tract: No adrenal nodule bilaterally. Bilateral kidneys enhance symmetrically. No hydronephrosis. No hydroureter. The urinary bladder is unremarkable. Stomach/Bowel: Roux-en-Y gastric bypass surgical changes. Stomach is within normal limits. Long segment jejunum biliopancreatic limb as well as common channel demonstrating circumferential marked bowel wall thickening (5:215).  No pneumatosis. No small bowel dilatation. No definite swirling of the mesentery. No mesenteric edema. No evidence of large bowel wall thickening or dilatation. Appendix appears normal. Lymphatic: No lymphadenopathy. Reproductive: Prostate is enlarged measuring up to 5.2 cm. Other: Trace to small volume free intraperitoneal gas. Small volume free fluid. Layering hyperdensity within the pelvis adjacent to the rectum and prostate of unclear etiology (5:295). No organized fluid collection. Musculoskeletal: No abdominal wall hernia  or abnormality. No suspicious lytic or blastic osseous lesions. No acute displaced fracture. 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 Review of the MIP images confirms the above findings. IMPRESSION: 1. No acute thoracic or abdominal aorta abnormality. 2. 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 3. Trace to small volume pneumoperitoneum and small volume free fluid with layering hyperdensity within the pelvis suggestive of extravasated PO contrast of unclear etiology. Concern for bowel perforation with exact origin unclear. If patient is stable, recommend repeat CT abdomen pelvis with use of PO contrast. 4. Cholelithiasis versus gallbladder sludge with no CT evidence of acute cholecystitis. 5. Prostatomegaly. These results were called by telephone at the time of interpretation on 04/28/2024 at 7:46 pm to provider JOSHUA LONG , who verbally acknowledged these results. Electronically Signed   By: Morgane  Naveau M.D.   On: 04/28/2024 19:48    Impression/Recommendations Principal Problem:   Pneumoperitoneum  Acute kidney injury Baseline creatinine 1-1.1 range.  Presented with creatinine of 1.25 which has risen to 1.48 AKI, mild and multifactorial due to CT contrast, abdominal infection from perforated hollow viscus, and Perry op hemodynamics although did not see any clear hypotension.  Not on ACEI or ARB PTA. Avoid nephrotoxics and hypotension Continue IV fluids and monitor BMP daily.  Expect full recovery  Paroxysmal atrial flutter/fibrillation S/p failed ablations x 2 Chronic sinus bradycardia Not on rate control medications PTA due to underlying sinus bradycardia.  Clinically in sinus rhythm On Xarelto  at home Currently on IV heparin  bridging initiated by general surgery, continue and can restart Xarelto  when  able Outpatient follow-up with EP cardiology.  Reportedly has been scheduled for an ablation on 06/30/2024.  Essential hypertension Controlled off of meds at home Reasonably controlled here.  Monitor.  OSA on CPAP Continue nightly CPAP if okay with primary team.  Recent NSTEMI LHC showed mild nonobstructive CAD and medical therapy recommended. No anginal symptoms Based on DC summary from 7/29, patient is supposed to be on aspirin  81 mg daily, rosuvastatin  20 mg daily but does not appear to be taking either. Encourage taking these medications and can be started when able.  Hypocalcemia Serum calcium  7.6 with normal albumin and ionized calcium  of 1.07 Will give a dose of IV calcium  gluconate and follow CMP in AM.  S/P Laparoscopic, Arlyss patch repair of perforated marginal ulcer at the gastrojejunostomy Management per primary service.  Remains n.p.o., IVF, IV PPI, IV Zosyn  and fluconazole  and multimodality pain control.  Thank you for this consultation.  We will follow the patient along with you.   Time Spent: 45 minutes.  Trenda Mar M.D. [facility] 04/29/2024, 3:07 PM  To contact the [facility] consulting provider between 7A-7P or the covering provider during after hours 7P-7A, please log into the web site [email or web address] and access using universal [place] password for that web site. If you do not have the password, please call the hospital operator.

## 2024-04-29 NOTE — Plan of Care (Addendum)
 Pain much controlled by the end of the shift, patient stated 6/10 at 1750 compared to a.m 9/10. Patient brushed teeth, freshened up, bed and gown changed. Patient safely ambulated to the bathroom several times and ambulated twice in the hall with spouse. Soiled dressing changed and PA notified and requested to add dressing orders. JP drainage thin pink/milky output noted and documented promptly. Bowel sounds faint, patient has not passed gas at 1750.  Plan of care continues.       Problem: Education: Goal: Knowledge of General Education information will improve Description: Including pain rating scale, medication(s)/side effects and non-pharmacologic comfort measures Outcome: Progressing   Problem: Health Behavior/Discharge Planning: Goal: Ability to manage health-related needs will improve Outcome: Progressing   Problem: Clinical Measurements: Goal: Ability to maintain clinical measurements within normal limits will improve Outcome: Progressing Goal: Will remain free from infection Outcome: Progressing Goal: Diagnostic test results will improve Outcome: Progressing Goal: Respiratory complications will improve Outcome: Progressing Goal: Cardiovascular complication will be avoided Outcome: Progressing   Problem: Activity: Goal: Risk for activity intolerance will decrease Outcome: Progressing   Problem: Nutrition: Goal: Adequate nutrition will be maintained Outcome: Progressing   Problem: Coping: Goal: Level of anxiety will decrease Outcome: Progressing   Problem: Elimination: Goal: Will not experience complications related to bowel motility Outcome: Progressing Goal: Will not experience complications related to urinary retention Outcome: Progressing   Problem: Pain Managment: Goal: General experience of comfort will improve and/or be controlled Outcome: Progressing   Problem: Safety: Goal: Ability to remain free from injury will improve Outcome: Progressing    Problem: Skin Integrity: Goal: Risk for impaired skin integrity will decrease Outcome: Progressing

## 2024-04-29 NOTE — Progress Notes (Signed)
 PHARMACY - ANTICOAGULATION CONSULT NOTE  Pharmacy Consult for heparin  Indication: chest pain/ACS  No Known Allergies  Patient Measurements: Height: 6' 2 (188 cm) Weight: 85.3 kg (188 lb 0.8 oz) IBW/kg (Calculated) : 82.2 HEPARIN  DW (KG): 85.3  Vital Signs: Temp: 97.5 F (36.4 C) (08/26 1946) Temp Source: Oral (08/26 1700) BP: 115/79 (08/26 1946) Pulse Rate: 56 (08/26 1946)  Labs: Recent Labs    04/28/24 1800 04/28/24 1808 04/28/24 2016 04/29/24 0626 04/29/24 2159  HGB 12.8* 13.3  --  13.9  --   HCT 37.9* 39.0  --  40.7  --   PLT 259  --   --  239  --   APTT  --   --   --   --  43*  HEPARINUNFRC  --   --   --   --  <0.10*  CREATININE 1.25* 1.20  --  1.48*  --   TROPONINIHS 4  --  3  --   --     Estimated Creatinine Clearance: 70.2 mL/min (A) (by C-G formula based on SCr of 1.48 mg/dL (H)).   Assessment: 47 yoM on xarelto  prior to admission presenting with abdominal pain found to have perforation at the gastrojejunostomy. Last dose Xarelto  8/25, reversed with Kcentra  and patient was taken for lararoscopic repair 8/25. Pharmacy consulted to manage heparin  infusion without bolus.   Heparin  level <0.1 (subtherapeutic and appears no longer being affected by Xarelto ), aPTT 43 sec (subtherapeutic) on infusion at 1000 units/hr. No issues with line or bleeding reported per RN.  Goal of Therapy:  Heparin  level 0.3-0.7 units/ml (will target lower end of range) Monitor platelets by anticoagulation protocol: Yes   Plan:  Increase heparin  infusion to 1300 units/hr. No bolus. Will f/u 8 hr heparin  level. D/c further aPTT checks  Vito Ralph, PharmD, BCPS Please see amion for complete clinical pharmacist phone list 04/29/2024 10:26 PM

## 2024-04-29 NOTE — Progress Notes (Addendum)
 PHARMACY - ANTICOAGULATION CONSULT NOTE  Pharmacy Consult for heparin  Indication: chest pain/ACS  No Known Allergies  Patient Measurements: Height: 6' 2 (188 cm) Weight: 85.3 kg (188 lb 0.8 oz) IBW/kg (Calculated) : 82.2 HEPARIN  DW (KG): 85.3  Vital Signs: Temp: 97.7 F (36.5 C) (08/26 0808) Temp Source: Oral (08/26 0808) BP: 112/71 (08/26 0808) Pulse Rate: 53 (08/26 0808)  Labs: Recent Labs    04/28/24 1800 04/28/24 1808 04/28/24 2016 04/29/24 0626  HGB 12.8* 13.3  --  13.9  HCT 37.9* 39.0  --  40.7  PLT 259  --   --  239  CREATININE 1.25* 1.20  --  1.48*  TROPONINIHS 4  --  3  --     Estimated Creatinine Clearance: 70.2 mL/min (A) (by C-G formula based on SCr of 1.48 mg/dL (H)).   Medical History: Past Medical History:  Diagnosis Date   Atrial fibrillation with RVR (HCC)    Gastric ulcer    Gout    Hypertension    Paroxysmal A-fib (HCC)    Sleep apnea    USES CPAP   Wears glasses      Assessment: 47 yoM on xarelto  prior to admission presenting with abdominal pain found to have perforation at the gastrojejunostomy. Last dose Xarelto  8/25, reversed with Kcentra  and patient was taken for lararoscopic repair 8/25. Pharmacy consulted to manage heparin  infusion without bolus.   Goal of Therapy:  Heparin  level 0.3-0.7 units/ml (will target lower end of range) Monitor platelets by anticoagulation protocol: Yes   Plan:  Start heparin  infusion at 1000 units/hr (no bolus) Check anti-Xa level in 8 hours and daily while on heparin  Continue to monitor H&H and platelets  Koren Or, PharmD Clinical Pharmacist 04/29/2024 1:21 PM Please check AMION for all [facility] Pharmacy numbers

## 2024-04-29 NOTE — Telephone Encounter (Signed)
 Left detailed message informing pt that office would call to arrange afib clinic appt about 3 weeks prior to scheduled ablation on 10/28

## 2024-04-29 NOTE — TOC CM/SW Note (Signed)
 Transition of Care [facility]) - Inpatient Brief Assessment   Patient Details  Name: Samuel Flynn MRN: [id number] Date of Birth: 08/11/1976  [facility] of [facility]) CM/SW Contact:    Lauraine FORBES Saa, LCSWA Phone Number: 04/29/2024, 9:18 AM   Clinical Narrative:  9:18 AM Per chart review, patient resides at home with spouse. Patient has a PCP and insurance. Patient does not have SNF or HH history. Patient has DME (CPAP) history. Patient's preferred pharmacy's are [facility] and [facility] [id number]. No TOC needs were identified at this time. TOC will continue to follow and be available to assist.  Transition of Care Asessment: Insurance and Status: Insurance coverage has been reviewed Patient has primary care physician: Yes Home environment has been reviewed: Private Residence Prior level of function:: N/A Prior/Current Home Services: No current home services Social Drivers of Health Review: SDOH reviewed no interventions necessary Readmission risk has been reviewed: Yes (Currently Green 10%) Transition of care needs: no transition of care needs at this time

## 2024-04-30 DIAGNOSIS — K668 Other specified disorders of peritoneum: Secondary | ICD-10-CM | POA: Diagnosis not present

## 2024-04-30 LAB — CBC
HCT: 37.2 % — ABNORMAL LOW (ref 39.0–52.0)
HCT: 38.6 % — ABNORMAL LOW (ref 39.0–52.0)
Hemoglobin: 12.4 g/dL — ABNORMAL LOW (ref 13.0–17.0)
Hemoglobin: 13 g/dL (ref 13.0–17.0)
MCH: 29.3 pg (ref 26.0–34.0)
MCH: 29.4 pg (ref 26.0–34.0)
MCHC: 33.3 g/dL (ref 30.0–36.0)
MCHC: 33.7 g/dL (ref 30.0–36.0)
MCV: 86.9 fL (ref 80.0–100.0)
MCV: 88.2 fL (ref 80.0–100.0)
Platelets: 207 K/uL (ref 150–400)
Platelets: 221 K/uL (ref 150–400)
RBC: 4.22 MIL/uL (ref 4.22–5.81)
RBC: 4.44 MIL/uL (ref 4.22–5.81)
RDW: 18 % — ABNORMAL HIGH (ref 11.5–15.5)
RDW: 18.1 % — ABNORMAL HIGH (ref 11.5–15.5)
WBC: 8 K/uL (ref 4.0–10.5)
WBC: 8.7 K/uL (ref 4.0–10.5)
nRBC: 0 % (ref 0.0–0.2)
nRBC: 0 % (ref 0.0–0.2)

## 2024-04-30 LAB — BASIC METABOLIC PANEL WITH GFR
Anion gap: 12 (ref 5–15)
Anion gap: 9 (ref 5–15)
BUN: 30 mg/dL — ABNORMAL HIGH (ref 6–20)
BUN: 32 mg/dL — ABNORMAL HIGH (ref 6–20)
CO2: 27 mmol/L (ref 22–32)
CO2: 27 mmol/L (ref 22–32)
Calcium: 7.7 mg/dL — ABNORMAL LOW (ref 8.9–10.3)
Calcium: 8.2 mg/dL — ABNORMAL LOW (ref 8.9–10.3)
Chloride: 103 mmol/L (ref 98–111)
Chloride: 101 mmol/L (ref 98–111)
Creatinine, Ser: 1.72 mg/dL — ABNORMAL HIGH (ref 0.61–1.24)
Creatinine, Ser: 1.54 mg/dL — ABNORMAL HIGH (ref 0.61–1.24)
GFR, Estimated: 48 mL/min — ABNORMAL LOW (ref >60)
GFR, Estimated: 55 mL/min — ABNORMAL LOW (ref >60)
Glucose, Bld: 100 mg/dL — ABNORMAL HIGH (ref 70–99)
Glucose, Bld: 92 mg/dL (ref 70–99)
Potassium: 4.9 mmol/L (ref 3.5–5.1)
Potassium: 4.5 mmol/L (ref 3.5–5.1)
Sodium: 142 mmol/L (ref 135–145)
Sodium: 137 mmol/L (ref 135–145)

## 2024-04-30 LAB — PHOSPHORUS: Phosphorus: 7.6 mg/dL — ABNORMAL HIGH (ref 2.5–4.6)

## 2024-04-30 LAB — HEPARIN LEVEL (UNFRACTIONATED)
Heparin Unfractionated: <0.1 [IU]/mL — ABNORMAL LOW (ref 0.30–0.70)
Heparin Unfractionated: <0.1 [IU]/mL — ABNORMAL LOW (ref 0.30–0.70)

## 2024-04-30 LAB — MAGNESIUM: Magnesium: 2 mg/dL (ref 1.7–2.4)

## 2024-04-30 LAB — OSMOLALITY, URINE: Osmolality, Ur: 542 mosm/kg (ref 300–900)

## 2024-04-30 LAB — SODIUM, URINE, RANDOM: Sodium, Ur: <30 mmol/L

## 2024-04-30 MED ORDER — DIPHENHYDRAMINE HCL 12.5 MG/5ML PO ELIX: 12.5000 mg | ORAL_SOLUTION | Freq: Four times a day (QID) | ORAL | Status: DC | PRN

## 2024-04-30 MED ORDER — METHADONE HCL 10 MG/ML IJ SOLN: 5.0000 mg | Freq: Three times a day (TID) | INTRAMUSCULAR | Status: DC

## 2024-04-30 MED ORDER — CALCIUM GLUCONATE-NACL 2-0.675 GM/100ML-% IV SOLN
2.0000 g | Freq: Once | INTRAVENOUS | Status: AC
  Administered 2024-04-30: 2000 mg via INTRAVENOUS
  Filled 2024-04-30: qty 100

## 2024-04-30 MED ORDER — LACTATED RINGERS IV SOLN: INTRAVENOUS | Status: AC

## 2024-04-30 MED ORDER — SODIUM CHLORIDE 0.9% FLUSH: 9.0000 mL | INTRAVENOUS | Status: DC | PRN

## 2024-04-30 MED ORDER — ACETAMINOPHEN 10 MG/ML IV SOLN
1000.0000 mg | Freq: Four times a day (QID) | INTRAVENOUS | Status: AC
  Administered 2024-04-30 – 2024-05-01 (×3): 1000 mg via INTRAVENOUS
  Filled 2024-04-30 (×3): qty 100

## 2024-04-30 MED ORDER — LACTATED RINGERS IV BOLUS
1000.0000 mL | Freq: Once | INTRAVENOUS | Status: AC
  Administered 2024-04-30: 1000 mL via INTRAVENOUS

## 2024-04-30 MED ORDER — NALOXONE HCL 0.4 MG/ML IJ SOLN: 0.4000 mg | INTRAMUSCULAR | Status: DC | PRN

## 2024-04-30 MED ORDER — DIPHENHYDRAMINE HCL 50 MG/ML IJ SOLN: 12.5000 mg | Freq: Four times a day (QID) | INTRAMUSCULAR | Status: DC | PRN

## 2024-04-30 MED ORDER — HYDROMORPHONE 1 MG/ML IV SOLN
INTRAVENOUS | Status: DC
  Administered 2024-04-30: 1 mg via INTRAVENOUS
  Administered 2024-04-30: 30 mg via INTRAVENOUS
  Administered 2024-05-01: 1.5 mg via INTRAVENOUS
  Administered 2024-05-01: 30 mg via INTRAVENOUS
  Administered 2024-05-02: 0.6 mg via INTRAVENOUS
  Filled 2024-04-30 (×2): qty 30

## 2024-04-30 NOTE — Progress Notes (Signed)
 PHARMACY - ANTICOAGULATION CONSULT NOTE  Pharmacy Consult for heparin  Indication: chest pain/ACS  No Known Allergies  Patient Measurements: Height: 6' 2 (188 cm) Weight: 85.3 kg (188 lb 0.8 oz) IBW/kg (Calculated) : 82.2 HEPARIN  DW (KG): 85.3  Vital Signs: Temp: 97.6 F (36.4 C) (08/27 1139) Temp Source: Oral (08/27 1139) BP: 147/87 (08/27 1410) Pulse Rate: 46 (08/27 1410)  Labs: Recent Labs    04/28/24 1800 04/28/24 1808 04/28/24 2016 04/29/24 0626 04/29/24 2159 04/30/24 0426 04/30/24 1429  HGB 12.8* 13.3  --  13.9  --  13.0  --   HCT 37.9* 39.0  --  40.7  --  38.6*  --   PLT 259  --   --  239  --  221  --   APTT  --   --   --   --  43*  --   --   HEPARINUNFRC  --   --   --   --  <0.10* <0.10* <0.10*  CREATININE 1.25* 1.20  --  1.48*  --  1.72*  --   TROPONINIHS 4  --  3  --   --   --   --     Estimated Creatinine Clearance: 60.4 mL/min (A) (by C-G formula based on SCr of 1.72 mg/dL (H)).   Assessment: 47 yoM on xarelto  prior to admission presenting with abdominal pain found to have perforation at the gastrojejunostomy. Last dose Xarelto  8/25, reversed with Kcentra  and patient was taken for lararoscopic repair 8/25. Pharmacy consulted to manage heparin  infusion without bolus.   Heparin  level <0.1 (subtherapeutic and appears no longer being affected by Xarelto ), aPTT 43 sec (subtherapeutic) on infusion at 1000 units/hr. No issues with line or bleeding reported per RN.  8/27 PM update: Heparin  level undetectable. No issues with the infusion or bleeding reported.  Goal of Therapy:  Heparin  level 0.3-0.7 units/ml (will target lower end of range) Monitor platelets by anticoagulation protocol: Yes   Plan:  No boluses  Inc heparin  to 1800 units/hr Heparin  level in 8 hours  Rocky Slade, PharmD, BCPS Clinical Pharmacist 04/30/2024 3:42 PM  Please check AMION for all [facility] Pharmacy phone numbers After 10:00 PM, call Main Pharmacy [phone]

## 2024-04-30 NOTE — Progress Notes (Signed)
 PROGRESS NOTE    Samuel Flynn  FMW:[id number] DOB: 05-Jan-1976 DOA: 04/28/2024 PCP: [name], MD   Brief Narrative:   48 y.o. 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, truck driver by occupation, medical history significant for previous Roux-en-Y bypass in 2023, paroxysmal A-fib/flutter, failed ablations x 2, plan for third ablation on 06/30/2024, now on Xarelto  since 04/07/24, chronic sinus bradycardia, HTN, OSA on CPAP, hypocalcemia, hospitalized 03/31/2024 - 04/01/2024 for complaints of chest pain, palpitations and admitted for NSTEMI, LHC showed mild nonobstructive CAD, medical therapy was recommended, echo showed LVEF of 60 to 65% with no wall motion abnormalities, since then has followed up with EP cardiology on 04/22/2024, presented to ED on 8/25 with complaints of diffuse abdominal pain.  He was taken urgently to the OR and underwent laparoscopic, Arlyss patch repair of perforated marginal ulcer at the gastrojejunostomy on 8/25, started on dilaudid  PCA pump on 8/27 and plan is for Upper GI on POD 4.  Assessment & Plan:  Principal Problem:   Pneumoperitoneum   Acute kidney injury,POA: Likely pre-renal and contrast induced damage Baseline creatinine 1-1.1 range.  Presented with creatinine of 1.25 which has risen to 1.72 today. AKI, mild and multifactorial due to CT contrast and abdominal infection from perforated hollow viscus Not on ACEI or ARB PTA. Avoid nephrotoxics and hypotension Continue IV fluids and monitor BMP daily.     Paroxysmal atrial flutter/fibrillation S/p failed ablations x 2 Chronic sinus bradycardia Not on rate control medications PTA due to underlying sinus bradycardia.  Clinically in sinus rhythm On Xarelto  at home Currently on IV heparin  bridging initiated by general surgery, continue and can restart Xarelto  when able Outpatient follow-up with EP cardiology.  Reportedly has been scheduled for an ablation on 06/30/2024.   Essential hypertension Controlled off of  meds at home Reasonably controlled here.  Monitor.   OSA: on CPAP Continue nightly CPAP if okay with primary team.   Recent NSTEMI LHC showed mild nonobstructive CAD and medical therapy recommended. No anginal symptoms Based on DC summary from 7/29, patient is supposed to be on aspirin  81 mg daily, rosuvastatin  20 mg daily but does not appear to be taking either. Encourage taking these medications and can be started when able.   Hypocalcemia Serum calcium  7.6 with normal albumin and ionized calcium  of 1.07 Will give a dose of IV calcium  gluconate and follow CMP in AM.   S/P Laparoscopic, Arlyss patch repair of perforated marginal ulcer at the gastrojejunostomy site, done on 8/25 Management per primary service.  Remains n.p.o., IVF, IV PPI, IV Zosyn  and fluconazole  and multimodality pain control. Upper GI series on POD 4 as per surgery   DVT prophylaxis: On Heparin  drip     Code Status: Full Code Family Communication:  Family is at the bedside Status is: Inpatient Remains inpatient appropriate because: post op, on PCA pump    Subjective:  Complaining of abdominal pain. He couldn't sleep because of the pain. He is strictly NPO and feels thirsty. Family is present at the bedside. He told me that he had RYGB done at [facility] two years back and has lost almost 200  lbs. His appetite has decreased since then and he also has chronic diarrhea.   Examination:  General exam: Appears to in mild distress secondary to abdominal pian. Respiratory system: Clear to auscultation. Respiratory effort normal. Cardiovascular system: S1 & S2 heard, RRR. No JVD, murmurs, rubs, gallops or clicks. No pedal edema. Gastrointestinal system: Incision looks clean, JP drain in place Central nervous  system: Alert and oriented. No focal neurological deficits. Extremities: Symmetric 5 x 5 power. Skin: No rashes, lesions or ulcers Psychiatry: Judgement and insight appear normal. Mood & affect  appropriate.     Diet Orders (From admission, onward)     Start     Ordered   04/29/24 0111  Diet NPO time specified  Diet effective now        04/29/24 0111            Objective: Vitals:   04/29/24 1700 04/29/24 1946 04/30/24 0527 04/30/24 0844  BP: 119/84 115/79 124/70 131/66  Pulse: (!) 58 (!) 56 (!) 50 (!) 58  Resp: 18 17  20   Temp: 97.7 F (36.5 C) (!) 97.5 F (36.4 C) 98.1 F (36.7 C) 98 F (36.7 C)  TempSrc: Oral  Oral Oral  SpO2: 99% 97% 98% 98%  Weight:      Height:        Intake/Output Summary (Last 24 hours) at 04/30/2024 1026 Last data filed at 04/30/2024 0529 Gross per 24 hour  Intake --  Output 460 ml  Net -460 ml   Filed Weights   04/28/24 1934  Weight: 85.3 kg    Scheduled Meds:  HYDROmorphone    Intravenous Q4H   lidocaine   2 patch Transdermal Q24H   pantoprazole  (PROTONIX ) IV  40 mg Intravenous Q12H   thiamine  (VITAMIN B1) injection  100 mg Intravenous Daily   Continuous Infusions:  acetaminophen  1,000 mg (04/30/24 0226)   acetaminophen      calcium  gluconate     fluconazole  (DIFLUCAN ) IV 400 mg (04/30/24 0308)   heparin  1,550 Units/hr (04/30/24 9178)   lactated ringers      lactated ringers      piperacillin -tazobactam (ZOSYN )  IV 3.375 g (04/30/24 0528)    Nutritional status     Body mass index is 24.14 kg/m.  Data Reviewed:   CBC: Recent Labs  Lab 04/28/24 1800 04/28/24 1808 04/29/24 0626 04/30/24 0426  WBC 10.9*  --  6.8 8.7  NEUTROABS 9.9*  --   --   --   HGB 12.8* 13.3 13.9 13.0  HCT 37.9* 39.0 40.7 38.6*  MCV 86.9  --  86.8 86.9  PLT 259  --  239 221   Basic Metabolic Panel: Recent Labs  Lab 04/28/24 1800 04/28/24 1808 04/29/24 0626 04/30/24 0426  NA 139 141 141 142  K 4.2 4.3 4.3 4.9  CL 105 107 104 103  CO2 23  --  26 27  GLUCOSE 109* 107* 151* 100*  BUN 14 15 18  30*  CREATININE 1.25* 1.20 1.48* 1.72*  CALCIUM  8.1*  --  7.6* 7.7*   GFR: Estimated Creatinine Clearance: 60.4 mL/min (A) (by C-G  formula based on SCr of 1.72 mg/dL (H)). Liver Function Tests: Recent Labs  Lab 04/28/24 1800  AST 19  ALT 22  ALKPHOS 98  BILITOT 1.8*  PROT 6.3*  ALBUMIN 3.5   Recent Labs  Lab 04/28/24 1800  LIPASE 51   No results for input(s): AMMONIA in the last 168 hours. Coagulation Profile: No results for input(s): INR, PROTIME in the last 168 hours. Cardiac Enzymes: No results for input(s): CKTOTAL, CKMB, CKMBINDEX, TROPONINI in the last 168 hours. BNP (last 3 results) No results for input(s): PROBNP in the last 8760 hours. HbA1C: No results for input(s): HGBA1C in the last 72 hours. CBG: No results for input(s): GLUCAP in the last 168 hours. Lipid Profile: No results for input(s): CHOL, HDL, LDLCALC, TRIG, CHOLHDL, LDLDIRECT in  the last 72 hours. Thyroid  Function Tests: No results for input(s): TSH, T4TOTAL, FREET4, T3FREE, THYROIDAB in the last 72 hours. Anemia Panel: No results for input(s): VITAMINB12, FOLATE, FERRITIN, TIBC, IRON, RETICCTPCT in the last 72 hours. Sepsis Labs: Recent Labs  Lab 04/28/24 1808 04/28/24 2020  LATICACIDVEN 0.9 1.0    No results found for this or any previous visit (from the past 240 hours).       Radiology Studies: CT Angio Chest/Abd/Pel for Dissection W and/or Wo Contrast Result Date: 04/28/2024 CLINICAL DATA:  Acute aortic syndrome (AAS) suspected abdominal pain all over ,no vomiting, no diarrhea - no recent abdominal surgery EXAM: CT ANGIOGRAPHY CHEST, ABDOMEN AND PELVIS TECHNIQUE: Non-contrast CT of the chest was initially obtained. Multidetector CT imaging through the chest, abdomen and pelvis was performed using the standard protocol during bolus administration of intravenous contrast. Multiplanar reconstructed images and MIPs were obtained and reviewed to evaluate the vascular anatomy. RADIATION DOSE REDUCTION: This exam was performed according to the departmental dose-optimization  program which includes automated exposure control, adjustment of the mA and/or kV according to patient size and/or use of iterative reconstruction technique. CONTRAST:  OMNIPAQUE  IOHEXOL  350 MG/ML SOLN COMPARISON:  CT abdomen pelvis 07/14/2020 FINDINGS: CTA CHEST FINDINGS Cardiovascular: Preferential opacification of the thoracic aorta. No evidence of thoracic aortic aneurysm or dissection. Normal heart size. No significant pericardial effusion. The thoracic aorta is normal in caliber. No atherosclerotic plaque of the thoracic aorta. No coronary artery calcifications. Mediastinum/Nodes: No enlarged mediastinal, hilar, or axillary lymph nodes. Thyroid  gland, trachea, and esophagus demonstrate no significant findings. Lungs/Pleura: Bibasilar atelectasis. No focal consolidation. No pulmonary nodule. No pulmonary mass. No pleural effusion. No pneumothorax. Musculoskeletal: No chest wall abnormality. No suspicious lytic or blastic osseous lesions. No acute displaced fracture. Review of the MIP images confirms the above findings. CTA ABDOMEN AND PELVIS FINDINGS VASCULAR Aorta: Mild atherosclerotic plaque. Normal caliber aorta without aneurysm, dissection, vasculitis or significant stenosis. Celiac: Patent without evidence of aneurysm, dissection, vasculitis or significant stenosis. SMA: Patent without evidence of aneurysm, dissection, vasculitis or significant stenosis. Renals: Both renal arteries are patent without evidence of aneurysm, dissection, vasculitis, fibromuscular dysplasia or significant stenosis. IMA: Patent without evidence of aneurysm, dissection, vasculitis or significant stenosis. Inflow: Mild atherosclerotic plaque. Patent without evidence of aneurysm, dissection, vasculitis or significant stenosis. Veins: No obvious venous abnormality within the limitations of this arterial phase study. Review of the MIP images confirms the above findings. NON-VASCULAR Hepatobiliary: No focal liver abnormality.  Layering hyperdensity within the gallbladder lumen may represent cholelithiasis versus gallbladder sludge. No gallbladder wall thickening or pericholecystic fluid. No biliary dilatation. Pancreas: No focal lesion. Normal pancreatic contour. No surrounding inflammatory changes. No main pancreatic ductal dilatation. Spleen: Normal in size without focal abnormality. Adrenals/Urinary Tract: No adrenal nodule bilaterally. Bilateral kidneys enhance symmetrically. No hydronephrosis. No hydroureter. The urinary bladder is unremarkable. Stomach/Bowel: Roux-en-Y gastric bypass surgical changes. Stomach is within normal limits. Long segment jejunum biliopancreatic limb as well as common channel demonstrating circumferential marked bowel wall thickening (5:215). No pneumatosis. No small bowel dilatation. No definite swirling of the mesentery. No mesenteric edema. No evidence of large bowel wall thickening or dilatation. Appendix appears normal. Lymphatic: No lymphadenopathy. Reproductive: Prostate is enlarged measuring up to 5.2 cm. Other: Trace to small volume free intraperitoneal gas. Small volume free fluid. Layering hyperdensity within the pelvis adjacent to the rectum and prostate of unclear etiology (5:295). No organized fluid collection. Musculoskeletal: No abdominal wall hernia or abnormality. No suspicious lytic or  blastic osseous lesions. No acute displaced fracture. 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 Review of the MIP images confirms the above findings. IMPRESSION: 1. No acute thoracic or abdominal aorta abnormality. 2. 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 3. Trace to small volume pneumoperitoneum and small volume free fluid with layering hyperdensity within the pelvis suggestive of extravasated PO contrast of unclear  etiology. Concern for bowel perforation with exact origin unclear. If patient is stable, recommend repeat CT abdomen pelvis with use of PO contrast. 4. Cholelithiasis versus gallbladder sludge with no CT evidence of acute cholecystitis. 5. Prostatomegaly. These results were called by telephone at the time of interpretation on 04/28/2024 at 7:46 pm to provider JOSHUA LONG , who verbally acknowledged these results. Electronically Signed   By: Morgane  Naveau M.D.   On: 04/28/2024 19:48         LOS: 2 days   Time spent= 41 mins    Deliliah Room, MD [facility]  If 7PM-7AM, please contact night-coverage  04/30/2024, 10:26 AM

## 2024-04-30 NOTE — Progress Notes (Signed)
 PHARMACY - ANTICOAGULATION CONSULT NOTE  Pharmacy Consult for heparin  Indication: chest pain/ACS  No Known Allergies  Patient Measurements: Height: 6' 2 (188 cm) Weight: 85.3 kg (188 lb 0.8 oz) IBW/kg (Calculated) : 82.2 HEPARIN  DW (KG): 85.3  Vital Signs: Temp: 98.1 F (36.7 C) (08/27 0527) Temp Source: Oral (08/27 0527) BP: 124/70 (08/27 0527) Pulse Rate: 50 (08/27 0527)  Labs: Recent Labs    04/28/24 1800 04/28/24 1808 04/28/24 2016 04/29/24 0626 04/29/24 2159 04/30/24 0426  HGB 12.8* 13.3  --  13.9  --  13.0  HCT 37.9* 39.0  --  40.7  --  38.6*  PLT 259  --   --  239  --  221  APTT  --   --   --   --  43*  --   HEPARINUNFRC  --   --   --   --  <0.10* <0.10*  CREATININE 1.25* 1.20  --  1.48*  --  1.72*  TROPONINIHS 4  --  3  --   --   --     Estimated Creatinine Clearance: 60.4 mL/min (A) (by C-G formula based on SCr of 1.72 mg/dL (H)).   Assessment: 47 yoM on xarelto  prior to admission presenting with abdominal pain found to have perforation at the gastrojejunostomy. Last dose Xarelto  8/25, reversed with Kcentra  and patient was taken for lararoscopic repair 8/25. Pharmacy consulted to manage heparin  infusion without bolus.   Heparin  level <0.1 (subtherapeutic and appears no longer being affected by Xarelto ), aPTT 43 sec (subtherapeutic) on infusion at 1000 units/hr. No issues with line or bleeding reported per RN.  8/27 AM update:  Heparin  level sub-therapeutic   Goal of Therapy:  Heparin  level 0.3-0.7 units/ml (will target lower end of range) Monitor platelets by anticoagulation protocol: Yes   Plan:  No boluses  Inc heparin  to 1550 units/hr Heparin  level in 8 hours  Lynwood Mckusick, PharmD, BCPS Clinical Pharmacist Phone: [phone]

## 2024-04-30 NOTE — Progress Notes (Signed)
 General Surgery Follow Up Note  Subjective:    Overnight Issues:   Objective:  Vital signs for last 24 hours: Temp:  [97.5 F (36.4 C)-98.1 F (36.7 C)] 98 F (36.7 C) (08/27 0844) Pulse Rate:  [50-58] 58 (08/27 0844) Resp:  [17-20] 20 (08/27 0844) BP: (115-131)/(66-84) 131/66 (08/27 0844) SpO2:  [97 %-99 %] 98 % (08/27 0844)  Hemodynamic parameters for last 24 hours:    Intake/Output from previous day: 08/26 0701 - 08/27 0700 In: 0  Out: 930 [Drains:930]  Intake/Output this shift: No intake/output data recorded.  Vent settings for last 24 hours:    Physical Exam:  Gen: comfortable, no distress Neuro: follows commands, alert, communicative HEENT: PERRL Neck: supple CV: RRR Pulm: unlabored breathing on RA Abd: soft, significantly TTP in the RUQ , incision clean, dry, intact, JP with murky SS fluid-drain stripped and maybe some pale green in the tubing GU: urine clear and yellow, +spontaneous void Extr: wwp, no edema  Results for orders placed or performed during the hospital encounter of 04/28/24 (from the past 24 hours)  Heparin  level (unfractionated)     Status: Abnormal   Collection Time: 04/29/24  9:59 PM  Result Value Ref Range   Heparin  Unfractionated <0.10 (L) 0.30 - 0.70 IU/mL  APTT     Status: Abnormal   Collection Time: 04/29/24  9:59 PM  Result Value Ref Range   aPTT 43 (H) 24 - 36 seconds  CBC     Status: Abnormal   Collection Time: 04/30/24  4:26 AM  Result Value Ref Range   WBC 8.7 4.0 - 10.5 K/uL   RBC 4.44 4.22 - 5.81 MIL/uL   Hemoglobin 13.0 13.0 - 17.0 g/dL   HCT 61.3 (L) 60.9 - 47.9 %   MCV 86.9 80.0 - 100.0 fL   MCH 29.3 26.0 - 34.0 pg   MCHC 33.7 30.0 - 36.0 g/dL   RDW 81.9 (H) 88.4 - 84.4 %   Platelets 221 150 - 400 K/uL   nRBC 0.0 0.0 - 0.2 %  Basic metabolic panel     Status: Abnormal   Collection Time: 04/30/24  4:26 AM  Result Value Ref Range   Sodium 142 135 - 145 mmol/L   Potassium 4.9 3.5 - 5.1 mmol/L   Chloride 103  98 - 111 mmol/L   CO2 27 22 - 32 mmol/L   Glucose, Bld 100 (H) 70 - 99 mg/dL   BUN 30 (H) 6 - 20 mg/dL   Creatinine, Ser 8.27 (H) 0.61 - 1.24 mg/dL   Calcium  7.7 (L) 8.9 - 10.3 mg/dL   GFR, Estimated 48 (L) >60 mL/min   Anion gap 12 5 - 15  Heparin  level (unfractionated)     Status: Abnormal   Collection Time: 04/30/24  4:26 AM  Result Value Ref Range   Heparin  Unfractionated <0.10 (L) 0.30 - 0.70 IU/mL    Assessment & Plan:  Present on Admission:  Pneumoperitoneum    LOS: 2 days   Additional comments:I reviewed the patient's new clinical lab test results.   and I reviewed the patients new imaging test results.    POD2: S/P Laparoscopy, diagnostic laparoscopic Arlyss patch repair placement of 19 French Blake drain by Dr. Rubin on 04/28/2024 due to free air of abdomen/perforated marginal ulcer at the gastrojejunostomy -Afebrile. -Creatinine 1.7 from 1.4, resume IVF and give LR bolus -WBC and hgb normal - drain fluid suspected to be residual irrigant, but will watch closely for output quantity (currently  downtrending) and quality. May consider doing UGI earlier.  -Continue strict NPO, Plan upper GI POD4 -Continue  IV abx, Will plan for 4 days post-op -Continue PPI IV BID -Ordered H.Pylori testing - start PCA as pain is not well controlled with intermittent dosing and unable to give IV robaxin  due to renal fxn and unable to give toradol  due to h/o GBP     FEN: strict NPO, replete hypocalcemia, check mag/PO4, recheck BMP this PM VTE: SCDs, heparin  gtt restarted given recent MI, currently subtherapeutic ID: Zosyn /diflucan     Samuel GEANNIE Hanger, MD Trauma & General Surgery Please use [email or web address] to contact on call provider  04/30/2024  *Care during the described time interval was provided by me. I have reviewed this patient's available data, including medical history, events of note, physical examination and test results as part of my evaluation.

## 2024-05-01 DIAGNOSIS — K668 Other specified disorders of peritoneum: Secondary | ICD-10-CM | POA: Diagnosis not present

## 2024-05-01 LAB — COMPREHENSIVE METABOLIC PANEL WITH GFR
ALT: 37 U/L (ref 0–44)
AST: 51 U/L — ABNORMAL HIGH (ref 15–41)
Albumin: 2.3 g/dL — ABNORMAL LOW (ref 3.5–5.0)
Alkaline Phosphatase: 47 U/L (ref 38–126)
Anion gap: 8 (ref 5–15)
BUN: 23 mg/dL — ABNORMAL HIGH (ref 6–20)
CO2: 29 mmol/L (ref 22–32)
Calcium: 7.9 mg/dL — ABNORMAL LOW (ref 8.9–10.3)
Chloride: 103 mmol/L (ref 98–111)
Creatinine, Ser: 1.35 mg/dL — ABNORMAL HIGH (ref 0.61–1.24)
GFR, Estimated: >60 mL/min (ref >60)
Glucose, Bld: 81 mg/dL (ref 70–99)
Potassium: 4.4 mmol/L (ref 3.5–5.1)
Sodium: 140 mmol/L (ref 135–145)
Total Bilirubin: 0.9 mg/dL (ref 0.0–1.2)
Total Protein: 5.4 g/dL — ABNORMAL LOW (ref 6.5–8.1)

## 2024-05-01 LAB — BASIC METABOLIC PANEL WITH GFR
Anion gap: 9 (ref 5–15)
BUN: 28 mg/dL — ABNORMAL HIGH (ref 6–20)
CO2: 28 mmol/L (ref 22–32)
Calcium: 8.1 mg/dL — ABNORMAL LOW (ref 8.9–10.3)
Chloride: 102 mmol/L (ref 98–111)
Creatinine, Ser: 1.43 mg/dL — ABNORMAL HIGH (ref 0.61–1.24)
GFR, Estimated: >60 mL/min (ref >60)
Glucose, Bld: 89 mg/dL (ref 70–99)
Potassium: 5.8 mmol/L — ABNORMAL HIGH (ref 3.5–5.1)
Sodium: 139 mmol/L (ref 135–145)

## 2024-05-01 LAB — HEPARIN LEVEL (UNFRACTIONATED)
Heparin Unfractionated: 0.15 [IU]/mL — ABNORMAL LOW (ref 0.30–0.70)
Heparin Unfractionated: 0.18 [IU]/mL — ABNORMAL LOW (ref 0.30–0.70)
Heparin Unfractionated: <0.1 [IU]/mL — ABNORMAL LOW (ref 0.30–0.70)

## 2024-05-01 MED ORDER — SODIUM CHLORIDE 0.9 % IV SOLN: INTRAVENOUS | Status: AC

## 2024-05-01 MED ORDER — LACTATED RINGERS IV SOLN: INTRAVENOUS | Status: DC

## 2024-05-01 NOTE — Progress Notes (Signed)
 Jp dressing RLQ has been changed, moderate amount of drainage coming from site.

## 2024-05-01 NOTE — Plan of Care (Signed)

## 2024-05-01 NOTE — Progress Notes (Signed)
 PHARMACY - ANTICOAGULATION CONSULT NOTE  Pharmacy Consult for heparin  Indication: chest pain/ACS, atrial fibrillation  No Known Allergies  Patient Measurements: Height: 6' 2 (188 cm) Weight: 85.3 kg (188 lb 0.8 oz) IBW/kg (Calculated) : 82.2 HEPARIN  DW (KG): 85.3  Vital Signs: Temp: 98.3 F (36.8 C) (08/28 1653) Temp Source: Oral (08/28 1653) BP: 151/82 (08/28 1653) Pulse Rate: 51 (08/28 1653)  Labs: Recent Labs    04/28/24 1800 04/28/24 1808 04/28/24 2016 04/29/24 0626 04/29/24 2159 04/29/24 2159 04/30/24 0426 04/30/24 1428 04/30/24 1429 04/30/24 2337 05/01/24 0855 05/01/24 1543  HGB 12.8*   < >  --  13.9  --   --  13.0  --   --  12.4*  --   --   HCT 37.9*   < >  --  40.7  --   --  38.6*  --   --  37.2*  --   --   PLT 259  --   --  239  --   --  221  --   --  207  --   --   APTT  --   --   --   --  43*  --   --   --   --   --   --   --   HEPARINUNFRC  --   --   --   --  <0.10*   < > <0.10*  --    < > <0.10* 0.15* 0.18*  CREATININE 1.25*   < >  --  1.48*  --   --  1.72* 1.54*  --  1.43* 1.35*  --   TROPONINIHS 4  --  3  --   --   --   --   --   --   --   --   --    < > = values in this interval not displayed.    Estimated Creatinine Clearance: 77 mL/min (A) (by C-G formula based on SCr of 1.35 mg/dL (H)).   Assessment: 47 yoM on xarelto  prior to admission presenting with abdominal pain found to have perforation at the gastrojejunostomy. Last dose Xarelto  8/25, reversed with Kcentra  and patient was taken for lararoscopic repair 8/25. Pharmacy consulted to manage heparin  infusion without bolus.   Heparin  level <0.1 (subtherapeutic and appears no longer being affected by Xarelto ) on 1800 units/hr. No issues with line or bleeding reported per RN.  Heparin  up to 0.18 but still subtherapeutic. No issues with the infusion or bleeding reported. We will increase rate and check again.   Goal of Therapy:  Heparin  level 0.3-0.7 units/ml (will target lower end of  range) Monitor platelets by anticoagulation protocol: Yes   Plan:  No boluses  Inc heparin  to 2600 units/hr Heparin  level in 6 hours  Rocky Slade, PharmD, BCPS 05/01/2024 5:09 PM  Please check AMION for all [facility] phone numbers After 10:00 PM, call Main Pharmacy [phone]

## 2024-05-01 NOTE — Progress Notes (Signed)
 IVT consulted for additional access- [place] with primary RN, Reviewed ordered medications. Current IV access is sufficient as all ordered medications are compatible.  Additional IV access placement is not indicated at this time.  Primary RN to re-consult should pt's POC or status of current access change.

## 2024-05-01 NOTE — Progress Notes (Signed)
 PHARMACY - ANTICOAGULATION CONSULT NOTE  Pharmacy Consult for heparin  Indication: chest pain/ACS, atrial fibrillation  No Known Allergies  Patient Measurements: Height: 6' 2 (188 cm) Weight: 85.3 kg (188 lb 0.8 oz) IBW/kg (Calculated) : 82.2 HEPARIN  DW (KG): 85.3  Vital Signs: Temp: 97.7 F (36.5 C) (08/27 2339) Temp Source: Oral (08/27 2339) BP: 127/86 (08/27 2339) Pulse Rate: 51 (08/27 2339)  Labs: Recent Labs    04/28/24 1800 04/28/24 1808 04/28/24 2016 04/29/24 0626 04/29/24 2159 04/29/24 2159 04/30/24 0426 04/30/24 1428 04/30/24 1429 04/30/24 2337  HGB 12.8*   < >  --  13.9  --   --  13.0  --   --  12.4*  HCT 37.9*   < >  --  40.7  --   --  38.6*  --   --  37.2*  PLT 259  --   --  239  --   --  221  --   --  207  APTT  --   --   --   --  43*  --   --   --   --   --   HEPARINUNFRC  --   --   --   --  <0.10*   < > <0.10*  --  <0.10* <0.10*  CREATININE 1.25*   < >  --  1.48*  --   --  1.72* 1.54*  --   --   TROPONINIHS 4  --  3  --   --   --   --   --   --   --    < > = values in this interval not displayed.    Estimated Creatinine Clearance: 67.5 mL/min (A) (by C-G formula based on SCr of 1.54 mg/dL (H)).   Assessment: 47 yoM on xarelto  prior to admission presenting with abdominal pain found to have perforation at the gastrojejunostomy. Last dose Xarelto  8/25, reversed with Kcentra  and patient was taken for lararoscopic repair 8/25. Pharmacy consulted to manage heparin  infusion without bolus.   Heparin  level <0.1 (subtherapeutic and appears no longer being affected by Xarelto ) on 1800 units/hr. No issues with line or bleeding reported per RN.  8/28 AM update: Heparin  level undetectable. No issues with the infusion or bleeding reported.  Goal of Therapy:  Heparin  level 0.3-0.7 units/ml (will target lower end of range) Monitor platelets by anticoagulation protocol: Yes   Plan:  No boluses  Inc heparin  to 2100 units/hr Heparin  level in 8 hours  Lynwood Poplar, PharmD, BCPS Clinical Pharmacist 05/01/2024 12:08 AM

## 2024-05-01 NOTE — Progress Notes (Addendum)
 PROGRESS NOTE    Samuel Flynn  FMW:[id number] DOB: August 31, 1976 DOA: 04/28/2024 PCP: [name], MD   Brief Narrative:   48 y.o. 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, truck driver by occupation, medical history significant for previous Roux-en-Y bypass in 2023, paroxysmal A-fib/flutter, failed ablations x 2, plan for third ablation on 06/30/2024, now on Xarelto  since 04/07/24, chronic sinus bradycardia, HTN, OSA on CPAP, hypocalcemia, hospitalized 03/31/2024 - 04/01/2024 for complaints of chest pain, palpitations and admitted for NSTEMI, LHC showed mild nonobstructive CAD, medical therapy was recommended, echo showed LVEF of 60 to 65% with no wall motion abnormalities, since then has followed up with EP cardiology on 04/22/2024, presented to ED on 8/25 with complaints of diffuse abdominal pain.  He was taken urgently to the OR and underwent laparoscopic, Arlyss patch repair of perforated marginal ulcer at the gastrojejunostomy on 8/25, started on dilaudid  PCA pump on 8/27 and plan is for Upper GI on POD 4.  Assessment & Plan:  Principal Problem:   Pneumoperitoneum   Acute kidney injury,POA: Likely pre-renal and contrast induced damage: Improving Baseline creatinine 1-1.1 range.  Presented with creatinine of 1.25, peaked at 1.72 and today is 1.35. AKI, mild and multifactorial due to CT contrast and abdominal infection from perforated hollow viscus Not on ACEI or ARB PTA. Avoid nephrotoxics and hypotension Continue IV fluids and monitor BMP daily.     Paroxysmal atrial flutter/fibrillation S/p failed ablations x 2 Chronic sinus bradycardia Not on rate control medications PTA due to underlying sinus bradycardia.  Clinically in sinus rhythm On Xarelto  at home Currently on IV heparin  bridging initiated by general surgery, continue and can restart Xarelto  when able Outpatient follow-up with EP cardiology.  Reportedly has been scheduled for an ablation on 06/30/2024.   Essential  hypertension Controlled off of meds at home Reasonably controlled here.  Monitor.   OSA: on CPAP Continue nightly CPAP if okay with primary team.   Recent NSTEMI LHC showed mild nonobstructive CAD and medical therapy recommended. No anginal symptoms Based on DC summary from 7/29, patient is supposed to be on aspirin  81 mg daily, rosuvastatin  20 mg daily but does not appear to be taking either. Encourage taking these medications and can be started when able.   Hypocalcemia F/u BMP in am   S/P Laparoscopic, Arlyss patch repair of perforated marginal ulcer at the gastrojejunostomy site, done on 8/25 Management per primary service.  Remains n.p.o., IVF, IV PPI, IV Zosyn  and fluconazole  and multimodality pain control. Upper GI series on POD 4 (8/29) as per surgery   DVT prophylaxis: On Heparin  drip     Code Status: Full Code Family Communication:  Family is at the bedside Status is: Inpatient Remains inpatient appropriate because: post op, on PCA pump    Subjective:  He slept on and off last night. Abdominal pain has improved after the initiation of dilaudid . We spoke about his reduced RR and HR in the setting of dilaudid  PCA and the need for cardiac monitoring. Family is present at the bedside. He is passing flatus and did have a small BM yesterday.   Examination:  General exam: Appears to in mild distress secondary to abdominal pian. Respiratory system: Clear to auscultation. Respiratory effort normal. Cardiovascular system: S1 & S2 heard, RRR. No JVD, murmurs, rubs, gallops or clicks. No pedal edema. Gastrointestinal system: Incision looks clean, JP drain in place, hypoactive bowel sounds Central nervous system: Alert and oriented. No focal neurological deficits. Extremities: Symmetric 5 x 5 power. Skin: No rashes,  lesions or ulcers Psychiatry: Judgement and insight appear normal. Mood & affect appropriate.     Diet Orders (From admission, onward)     Start      Ordered   04/29/24 0111  Diet NPO time specified  Diet effective now        04/29/24 0111            Objective: Vitals:   04/30/24 2342 05/01/24 0359 05/01/24 0740 05/01/24 0839  BP:  (!) 150/88  125/89  Pulse:  (!) 51  (!) 53  Resp: 15 15 14    Temp:  97.7 F (36.5 C)  97.7 F (36.5 C)  TempSrc:  Oral  Oral  SpO2: 100% 100% 100% 100%  Weight:      Height:        Intake/Output Summary (Last 24 hours) at 05/01/2024 0929 Last data filed at 05/01/2024 0656 Gross per 24 hour  Intake 3104.88 ml  Output 2045 ml  Net 1059.88 ml   Filed Weights   04/28/24 1934  Weight: 85.3 kg    Scheduled Meds:  HYDROmorphone    Intravenous Q4H   lidocaine   2 patch Transdermal Q24H   pantoprazole  (PROTONIX ) IV  40 mg Intravenous Q12H   thiamine  (VITAMIN B1) injection  100 mg Intravenous Daily   Continuous Infusions:  fluconazole  (DIFLUCAN ) IV 400 mg (05/01/24 0020)   heparin  2,100 Units/hr (05/01/24 0017)   lactated ringers  100 mL/hr at 04/30/24 1811   piperacillin -tazobactam (ZOSYN )  IV 3.375 g (05/01/24 0514)    Nutritional status     Body mass index is 24.14 kg/m.  Data Reviewed:   CBC: Recent Labs  Lab 04/28/24 1800 04/28/24 1808 04/29/24 0626 04/30/24 0426 04/30/24 2337  WBC 10.9*  --  6.8 8.7 8.0  NEUTROABS 9.9*  --   --   --   --   HGB 12.8* 13.3 13.9 13.0 12.4*  HCT 37.9* 39.0 40.7 38.6* 37.2*  MCV 86.9  --  86.8 86.9 88.2  PLT 259  --  239 221 207   Basic Metabolic Panel: Recent Labs  Lab 04/28/24 1800 04/28/24 1808 04/29/24 0626 04/30/24 0426 04/30/24 1428 04/30/24 2337  NA 139 141 141 142 137 139  K 4.2 4.3 4.3 4.9 4.5 5.8*  CL 105 107 104 103 101 102  CO2 23  --  26 27 27 28   GLUCOSE 109* 107* 151* 100* 92 89  BUN 14 15 18  30* 32* 28*  CREATININE 1.25* 1.20 1.48* 1.72* 1.54* 1.43*  CALCIUM  8.1*  --  7.6* 7.7* 8.2* 8.1*  MG  --   --   --  2.0  --   --   PHOS  --   --   --  7.6*  --   --    GFR: Estimated Creatinine Clearance: 72.7 mL/min  (A) (by C-G formula based on SCr of 1.43 mg/dL (H)). Liver Function Tests: Recent Labs  Lab 04/28/24 1800  AST 19  ALT 22  ALKPHOS 98  BILITOT 1.8*  PROT 6.3*  ALBUMIN 3.5   Recent Labs  Lab 04/28/24 1800  LIPASE 51   No results for input(s): AMMONIA in the last 168 hours. Coagulation Profile: No results for input(s): INR, PROTIME in the last 168 hours. Cardiac Enzymes: No results for input(s): CKTOTAL, CKMB, CKMBINDEX, TROPONINI in the last 168 hours. BNP (last 3 results) No results for input(s): PROBNP in the last 8760 hours. HbA1C: No results for input(s): HGBA1C in the last 72 hours. CBG: No results  for input(s): GLUCAP in the last 168 hours. Lipid Profile: No results for input(s): CHOL, HDL, LDLCALC, TRIG, CHOLHDL, LDLDIRECT in the last 72 hours. Thyroid  Function Tests: No results for input(s): TSH, T4TOTAL, FREET4, T3FREE, THYROIDAB in the last 72 hours. Anemia Panel: No results for input(s): VITAMINB12, FOLATE, FERRITIN, TIBC, IRON, RETICCTPCT in the last 72 hours. Sepsis Labs: Recent Labs  Lab 04/28/24 1808 04/28/24 2020  LATICACIDVEN 0.9 1.0    No results found for this or any previous visit (from the past 240 hours).       Radiology Studies: No results found.        LOS: 3 days   Time spent= 41 mins    Deliliah Room, MD [facility]  If 7PM-7AM, please contact night-coverage  05/01/2024, 9:29 AM

## 2024-05-01 NOTE — Progress Notes (Signed)
 Patient is transferring to 6  N. Report called.  12 ml Dilaudid  wasted with the witness of RN Autumn.

## 2024-05-01 NOTE — Progress Notes (Signed)
 Patient arrived to [phone] with Nurse and Tech from [facility]. A&OX4 with a pain level of 8/10. Patient's currently on PCA pump, equipment will be set up now and connected. Bed in lowest position and call light within reach.

## 2024-05-01 NOTE — Progress Notes (Signed)
 Progress Note  3 Days Post-Op  Subjective: Patient reports pain has been manageable with PCA. Reports bowel movement yesterday. No BM today yet. Having flatulence. Denies nausea and vomiting.  ROS  All negative with the exception of above.  Objective: Vital signs in last 24 hours: Temp:  [97.6 F (36.4 C)-98 F (36.7 C)] 97.7 F (36.5 C) (08/28 0839) Pulse Rate:  [40-53] 53 (08/28 0839) Resp:  [12-16] 14 (08/28 0740) BP: (109-150)/(68-92) 125/89 (08/28 0839) SpO2:  [98 %-100 %] 100 % (08/28 0839) FiO2 (%):  [100 %] 100 % (08/28 0740) Last BM Date : 04/27/24  Intake/Output from previous day: 08/27 0701 - 08/28 0700 In: 3104.9 [I.V.:2004.9; IV Piggyback:1100] Out: 2085 [Urine:1600; Drains:485] Intake/Output this shift: No intake/output data recorded.  PE: General: Male who is laying in bed in NAD. HEENT: Head is normocephalic, atraumatic.  Sclera are noninjected. Conjunctiva anicteric. EOMI.  Lungs: Respiratory effort nonlabored. Abd: Soft with some distention. Generalized tenderness to palpation that is more prominent around right drain. Right drain in place. Bulb noted to have thin, pink, milky fluid. Fluid is lighter in color than previous exams. Around 35 noted in bulb. Psych: A&Ox3 with an appropriate affect.    Lab Results:  Recent Labs    04/30/24 0426 04/30/24 2337  WBC 8.7 8.0  HGB 13.0 12.4*  HCT 38.6* 37.2*  PLT 221 207   BMET Recent Labs    04/30/24 1428 04/30/24 2337  NA 137 139  K 4.5 5.8*  CL 101 102  CO2 27 28  GLUCOSE 92 89  BUN 32* 28*  CREATININE 1.54* 1.43*  CALCIUM  8.2* 8.1*   PT/INR No results for input(s): LABPROT, INR in the last 72 hours. CMP     Component Value Date/Time   NA 139 04/30/2024 2337   NA 141 02/06/2024 1614   K 5.8 (H) 04/30/2024 2337   CL 102 04/30/2024 2337   CO2 28 04/30/2024 2337   GLUCOSE 89 04/30/2024 2337   BUN 28 (H) 04/30/2024 2337   BUN 11 02/06/2024 1614   CREATININE 1.43 (H) 04/30/2024  2337   CALCIUM  8.1 (L) 04/30/2024 2337   PROT 6.3 (L) 04/28/2024 1800   PROT 6.4 02/06/2024 1614   ALBUMIN 3.5 04/28/2024 1800   ALBUMIN 4.1 02/06/2024 1614   AST 19 04/28/2024 1800   ALT 22 04/28/2024 1800   ALKPHOS 98 04/28/2024 1800   BILITOT 1.8 (H) 04/28/2024 1800   BILITOT 0.9 02/06/2024 1614   GFRNONAA >60 04/30/2024 2337   GFRAA >60 01/28/2020 1946   Lipase     Component Value Date/Time   LIPASE 51 04/28/2024 1800       Studies/Results: No results found.  Anti-infectives: Anti-infectives (From admission, onward)    Start     Dose/Rate Route Frequency Ordered Stop   04/29/24 0215  piperacillin -tazobactam (ZOSYN ) IVPB 3.375 g        3.375 g 12.5 mL/hr over 240 Minutes Intravenous Every 8 hours 04/29/24 0122 05/04/24 0559   04/29/24 0115  fluconazole  (DIFLUCAN ) IVPB 400 mg        400 mg 100 mL/hr over 120 Minutes Intravenous Every 24 hours 04/29/24 0111     04/28/24 2000  piperacillin -tazobactam (ZOSYN ) IVPB 3.375 g        3.375 g 100 mL/hr over 30 Minutes Intravenous  Once 04/28/24 1956 04/28/24 2117        Assessment/Plan POD3: S/P Laparoscopy, diagnostic laparoscopic Arlyss patch repair placement of 19 French Blake drain by Dr.  Rubin on 04/28/2024 due to free air of abdomen/perforated marginal ulcer at the gastrojejunostomy -Afebrile. -Labs pending/active -Drain fluid downtrending in quantity and quality.(currently downtrending) and quality. -Continue strict NPO, Planning for upper GI -Continue  IV abx, Will plan for 4 days post-op. -Continue PPI IV BID -Ordered H.Pylori testing -Pain controlled with PCA.     FEN: Strict NPO; VTE: SCDs, heparin  gtt restarted given recent MI ID: Zosyn /diflucan     LOS: 3 days   I reviewed hostpitalist notes, specialist notes, nursing notes, last 24 h vitals and pain scores, last 48 h intake and output, last 24 h labs and trends, and last 24 h imaging results.   Marjorie Carlyon Favre, [facility]  Surgery 05/01/2024, 9:21 AM Please see Amion for pager number during day hours 7:00am-4:30pm

## 2024-05-01 NOTE — Plan of Care (Signed)
  Problem: Health Behavior/Discharge Planning: Goal: Ability to manage health-related needs will improve Outcome: Progressing   Problem: Clinical Measurements: Goal: Ability to maintain clinical measurements within normal limits will improve Outcome: Progressing   Problem: Activity: Goal: Risk for activity intolerance will decrease Outcome: Progressing   Problem: Nutrition: Goal: Adequate nutrition will be maintained Outcome: Progressing   Problem: Coping: Goal: Level of anxiety will decrease Outcome: Progressing   Problem: Pain Managment: Goal: General experience of comfort will improve and/or be controlled Outcome: Progressing

## 2024-05-01 NOTE — Progress Notes (Signed)
 Pt had 6 beats of Vtach earlier at 1351 notified by Central Monitoring. Samuel Flynn, [facility] notified, will obtain EKG. Pt has no chest pain, or shortness of breath.

## 2024-05-01 NOTE — Progress Notes (Signed)
 Ok to order a CMP to eval k this AM per Dr. Dino.  Sergio Batch, PharmD, BCIDP, AAHIVP, CPP Infectious Disease Pharmacist 05/01/2024 8:26 AM

## 2024-05-01 NOTE — Progress Notes (Signed)
 PHARMACY - ANTICOAGULATION CONSULT NOTE  Pharmacy Consult for heparin  Indication: chest pain/ACS, atrial fibrillation  No Known Allergies  Patient Measurements: Height: 6' 2 (188 cm) Weight: 85.3 kg (188 lb 0.8 oz) IBW/kg (Calculated) : 82.2 HEPARIN  DW (KG): 85.3  Vital Signs: Temp: 97.7 F (36.5 C) (08/28 0839) Temp Source: Oral (08/28 0839) BP: 125/89 (08/28 0839) Pulse Rate: 53 (08/28 0839)  Labs: Recent Labs    04/28/24 1800 04/28/24 1808 04/28/24 2016 04/29/24 0626 04/29/24 2159 04/29/24 2159 04/30/24 0426 04/30/24 1428 04/30/24 1429 04/30/24 2337 05/01/24 0855  HGB 12.8*   < >  --  13.9  --   --  13.0  --   --  12.4*  --   HCT 37.9*   < >  --  40.7  --   --  38.6*  --   --  37.2*  --   PLT 259  --   --  239  --   --  221  --   --  207  --   APTT  --   --   --   --  43*  --   --   --   --   --   --   HEPARINUNFRC  --   --   --   --  <0.10*   < > <0.10*  --  <0.10* <0.10* 0.15*  CREATININE 1.25*   < >  --  1.48*  --   --  1.72* 1.54*  --  1.43*  --   TROPONINIHS 4  --  3  --   --   --   --   --   --   --   --    < > = values in this interval not displayed.    Estimated Creatinine Clearance: 72.7 mL/min (A) (by C-G formula based on SCr of 1.43 mg/dL (H)).   Assessment: 47 yoM on xarelto  prior to admission presenting with abdominal pain found to have perforation at the gastrojejunostomy. Last dose Xarelto  8/25, reversed with Kcentra  and patient was taken for lararoscopic repair 8/25. Pharmacy consulted to manage heparin  infusion without bolus.   Heparin  level <0.1 (subtherapeutic and appears no longer being affected by Xarelto ) on 1800 units/hr. No issues with line or bleeding reported per RN.  Heparin  up to 0.15 but still subtherapeutic. No issues with the infusion or bleeding reported. We will increase rate and check again.   Goal of Therapy:  Heparin  level 0.3-0.7 units/ml (will target lower end of range) Monitor platelets by anticoagulation protocol:  Yes   Plan:  No boluses  Inc heparin  to 2350 units/hr Heparin  level in 6 hours  Sergio Batch, PharmD, [place], AAHIVP, CPP Infectious Disease Pharmacist 05/01/2024 9:43 AM

## 2024-05-02 ENCOUNTER — Inpatient Hospital Stay (HOSPITAL_COMMUNITY)

## 2024-05-02 DIAGNOSIS — K668 Other specified disorders of peritoneum: Secondary | ICD-10-CM | POA: Diagnosis not present

## 2024-05-02 LAB — HEPARIN LEVEL (UNFRACTIONATED)
Heparin Unfractionated: 0.54 [IU]/mL (ref 0.30–0.70)
Heparin Unfractionated: 0.37 [IU]/mL (ref 0.30–0.70)

## 2024-05-02 LAB — CBC
HCT: 33 % — ABNORMAL LOW (ref 39.0–52.0)
Hemoglobin: 10.9 g/dL — ABNORMAL LOW (ref 13.0–17.0)
MCH: 29.2 pg (ref 26.0–34.0)
MCHC: 33 g/dL (ref 30.0–36.0)
MCV: 88.5 fL (ref 80.0–100.0)
Platelets: 184 K/uL (ref 150–400)
RBC: 3.73 MIL/uL — ABNORMAL LOW (ref 4.22–5.81)
RDW: 17.7 % — ABNORMAL HIGH (ref 11.5–15.5)
WBC: 6 K/uL (ref 4.0–10.5)
nRBC: 0 % (ref 0.0–0.2)

## 2024-05-02 LAB — BASIC METABOLIC PANEL WITH GFR
Anion gap: 7 (ref 5–15)
BUN: 15 mg/dL (ref 6–20)
CO2: 28 mmol/L (ref 22–32)
Calcium: 8 mg/dL — ABNORMAL LOW (ref 8.9–10.3)
Chloride: 103 mmol/L (ref 98–111)
Creatinine, Ser: 1.2 mg/dL (ref 0.61–1.24)
GFR, Estimated: >60 mL/min (ref >60)
Glucose, Bld: 82 mg/dL (ref 70–99)
Potassium: 4.5 mmol/L (ref 3.5–5.1)
Sodium: 138 mmol/L (ref 135–145)

## 2024-05-02 LAB — MAGNESIUM: Magnesium: 2 mg/dL (ref 1.7–2.4)

## 2024-05-02 MED ORDER — HYDROMORPHONE HCL 1 MG/ML IJ SOLN: 1.0000 mg | INTRAMUSCULAR | Status: DC | PRN

## 2024-05-02 MED ORDER — IOHEXOL 300 MG/ML  SOLN: 100.0000 mL | Freq: Once | INTRAMUSCULAR | Status: DC | PRN

## 2024-05-02 MED ORDER — HYDROMORPHONE HCL 1 MG/ML IJ SOLN: 2.0000 mg | INTRAMUSCULAR | Status: DC | PRN

## 2024-05-02 MED ORDER — ORAL CARE MOUTH RINSE: 15.0000 mL | OROMUCOSAL | Status: DC | PRN

## 2024-05-02 MED ORDER — OXYCODONE HCL 5 MG PO TABS
5.0000 mg | ORAL_TABLET | ORAL | Status: DC | PRN
  Administered 2024-05-02: 10 mg via ORAL
  Filled 2024-05-02: qty 2

## 2024-05-02 MED ORDER — METHOCARBAMOL 500 MG PO TABS
1000.0000 mg | ORAL_TABLET | Freq: Three times a day (TID) | ORAL | Status: DC
  Administered 2024-05-02 – 2024-05-06 (×11): 1000 mg via ORAL
  Filled 2024-05-02 (×12): qty 2

## 2024-05-02 MED ORDER — ACETAMINOPHEN 10 MG/ML IV SOLN
1000.0000 mg | Freq: Four times a day (QID) | INTRAVENOUS | Status: AC
  Administered 2024-05-02 – 2024-05-03 (×4): 1000 mg via INTRAVENOUS
  Filled 2024-05-02 (×4): qty 100

## 2024-05-02 MED ORDER — TRAMADOL HCL 50 MG PO TABS: 50.0000 mg | ORAL_TABLET | Freq: Four times a day (QID) | ORAL | Status: DC | PRN

## 2024-05-02 NOTE — Progress Notes (Signed)
 PHARMACY - ANTICOAGULATION CONSULT NOTE  Pharmacy Consult for heparin  Indication: ACS/Afib  Labs: Recent Labs    04/29/24 0626 04/29/24 2159 04/29/24 2159 04/30/24 0426 04/30/24 1428 04/30/24 1429 04/30/24 2337 05/01/24 0855 05/01/24 1543 05/02/24 0023  HGB 13.9  --   --  13.0  --   --  12.4*  --   --   --   HCT 40.7  --   --  38.6*  --   --  37.2*  --   --   --   PLT 239  --   --  221  --   --  207  --   --   --   APTT  --  43*  --   --   --   --   --   --   --   --   HEPARINUNFRC  --  <0.10*   < > <0.10*  --    < > <0.10* 0.15* 0.18* 0.54  CREATININE 1.48*  --   --  1.72* 1.54*  --  1.43* 1.35*  --   --    < > = values in this interval not displayed.   Assessment/Plan:  48yo male therapeutic on heparin  after rate change. Will continue infusion at current rate of 2600 units/hr and confirm stable with additional level.  Marvetta Dauphin, PharmD, BCPS 05/02/2024 1:11 AM

## 2024-05-02 NOTE — Plan of Care (Signed)
  Problem: Education: Goal: Knowledge of General Education information will improve Description: Including pain rating scale, medication(s)/side effects and non-pharmacologic comfort measures Outcome: Progressing   Problem: Activity: Goal: Risk for activity intolerance will decrease Outcome: Progressing   Problem: Coping: Goal: Level of anxiety will decrease Outcome: Progressing   Problem: Elimination: Goal: Will not experience complications related to urinary retention Outcome: Progressing   

## 2024-05-02 NOTE — Plan of Care (Signed)

## 2024-05-02 NOTE — Progress Notes (Signed)
 Progress Note  4 Days Post-Op  Subjective: Patient reports pain continues to be much better controlled with PCA. Passing some flatus, has had small amount of stool. Discussed plans for UGI today.   Objective: Vital signs in last 24 hours: Temp:  [97.6 F (36.4 C)-98.3 F (36.8 C)] 98 F (36.7 C) (08/29 0903) Pulse Rate:  [49-53] 49 (08/29 0903) Resp:  [11-20] 15 (08/29 0800) BP: (137-151)/(74-93) 148/82 (08/29 0903) SpO2:  [98 %-100 %] 100 % (08/29 0903) FiO2 (%):  [100 %] 100 % (08/29 0800) Last BM Date : 05/01/24  Intake/Output from previous day: 08/28 0701 - 08/29 0700 In: 484.1 [I.V.:234; IV Piggyback:250.1] Out: 1485 [Urine:1350; Drains:135] Intake/Output this shift: Total I/O In: 0  Out: 600 [Urine:500; Drains:100]  PE: General: Male who is laying in bed in NAD. Lungs: Respiratory effort nonlabored. Abd: Soft. Appropriately ttp, incisions C/D/I, drain with SS fluid and some leakage around Psych: A&Ox3 with an appropriate affect.    Lab Results:  Recent Labs    04/30/24 2337 05/02/24 0344  WBC 8.0 6.0  HGB 12.4* 10.9*  HCT 37.2* 33.0*  PLT 207 184   BMET Recent Labs    05/01/24 0855 05/02/24 0344  NA 140 138  K 4.4 4.5  CL 103 103  CO2 29 28  GLUCOSE 81 82  BUN 23* 15  CREATININE 1.35* 1.20  CALCIUM  7.9* 8.0*   PT/INR No results for input(s): LABPROT, INR in the last 72 hours. CMP     Component Value Date/Time   NA 138 05/02/2024 0344   NA 141 02/06/2024 1614   K 4.5 05/02/2024 0344   CL 103 05/02/2024 0344   CO2 28 05/02/2024 0344   GLUCOSE 82 05/02/2024 0344   BUN 15 05/02/2024 0344   BUN 11 02/06/2024 1614   CREATININE 1.20 05/02/2024 0344   CALCIUM  8.0 (L) 05/02/2024 0344   PROT 5.4 (L) 05/01/2024 0855   PROT 6.4 02/06/2024 1614   ALBUMIN 2.3 (L) 05/01/2024 0855   ALBUMIN 4.1 02/06/2024 1614   AST 51 (H) 05/01/2024 0855   ALT 37 05/01/2024 0855   ALKPHOS 47 05/01/2024 0855   BILITOT 0.9 05/01/2024 0855   BILITOT 0.9  02/06/2024 1614   GFRNONAA >60 05/02/2024 0344   GFRAA >60 01/28/2020 1946   Lipase     Component Value Date/Time   LIPASE 51 04/28/2024 1800       Studies/Results: No results found.  Anti-infectives: Anti-infectives (From admission, onward)    Start     Dose/Rate Route Frequency Ordered Stop   04/29/24 0215  piperacillin -tazobactam (ZOSYN ) IVPB 3.375 g        3.375 g 12.5 mL/hr over 240 Minutes Intravenous Every 8 hours 04/29/24 0122 05/04/24 0559   04/29/24 0115  fluconazole  (DIFLUCAN ) IVPB 400 mg        400 mg 100 mL/hr over 120 Minutes Intravenous Every 24 hours 04/29/24 0111     04/28/24 2000  piperacillin -tazobactam (ZOSYN ) IVPB 3.375 g        3.375 g 100 mL/hr over 30 Minutes Intravenous  Once 04/28/24 1956 04/28/24 2117        Assessment/Plan POD4 S/P Laparoscopy, diagnostic laparoscopic Arlyss patch repair placement of 19 French Blake drain by Dr. Rubin on 04/28/2024 due to free air of abdomen/perforated marginal ulcer at the gastrojejunostomy - Afebrile, VSS, no leukocytosis - Drain is SS and output overall downtrending - would keep in place until taking solids and if remains SS likely remove prior to  DC - UGI today - if negative for leak will start liquids - Continue  IV abx through today - Continue PPI IV BID - Ordered H.Pylori testing - Pain controlled with PCA. If able to start liquids will add PO meds for pain control and likely DC PCA tomorrow      FEN: Strict NPO VTE: SCDs, heparin  gtt restarted given recent MI ID: Zosyn /diflucan     LOS: 4 days   I reviewed hostpitalist notes, specialist notes, nursing notes, last 24 h vitals and pain scores, last 48 h intake and output, last 24 h labs and trends, and last 24 h imaging results.   Burnard JONELLE Louder, [facility] Surgery 05/02/2024, 10:15 AM Please see Amion for pager number during day hours 7:00am-4:30pm

## 2024-05-02 NOTE — Progress Notes (Signed)
 PROGRESS NOTE    Samuel Flynn  FMW:[id number] DOB: 12-Oct-1975 DOA: 04/28/2024 PCP: [name], MD   Brief Narrative:   48 y.o. 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, truck driver by occupation, medical history significant for previous Roux-en-Y bypass in 2023, paroxysmal A-fib/flutter, failed ablations x 2, plan for third ablation on 06/30/2024, now on Xarelto  since 04/07/24, chronic sinus bradycardia, HTN, OSA on CPAP, hypocalcemia, hospitalized 03/31/2024 - 04/01/2024 for complaints of chest pain, palpitations and admitted for NSTEMI, LHC showed mild nonobstructive CAD, medical therapy was recommended, echo showed LVEF of 60 to 65% with no wall motion abnormalities, since then has followed up with EP cardiology on 04/22/2024, presented to ED on 8/25 with complaints of diffuse abdominal pain.  He was taken urgently to the OR and underwent laparoscopic, Arlyss patch repair of perforated marginal ulcer at the gastrojejunostomy on 8/25, started on dilaudid  PCA pump on 8/27 and plan is for Upper GI study to be done today.  Assessment & Plan:  Principal Problem:   Pneumoperitoneum   Acute kidney injury,POA: Likely pre-renal and contrast induced damage: Consistently Improving Baseline creatinine 1-1.1 range.  Presented with creatinine of 1.25, peaked at 1.72 and today is 1.2. AKI, mild and multifactorial due to CT contrast and abdominal infection from perforated hollow viscus Not on ACEI or ARB PTA. Avoid nephrotoxics and hypotension Monitor BMP daily.     Paroxysmal atrial flutter/fibrillation S/p failed ablations x 2 Chronic sinus bradycardia Not on rate control medications PTA due to underlying sinus bradycardia.  Clinically in sinus rhythm On Xarelto  at home Currently on IV heparin  bridging initiated by general surgery, continue and can restart Xarelto  when able Outpatient follow-up with EP cardiology.  Reportedly has been scheduled for an ablation on 06/30/2024.   Essential  hypertension Controlled off of meds at home Reasonably controlled here.  Monitor.   OSA: on CPAP Continue nightly CPAP if okay with primary team.   Recent NSTEMI LHC showed mild nonobstructive CAD and medical therapy recommended. No anginal symptoms Based on DC summary from 7/29, patient is supposed to be on aspirin  81 mg daily, rosuvastatin  20 mg daily but does not appear to be taking either. Encourage taking these medications and can be started when able.   Hypocalcemia F/u BMP in am   S/P Laparoscopic, Arlyss patch repair of perforated marginal ulcer at the gastrojejunostomy site, done on 8/25 Management per primary service.  Remains n.p.o., IVF, IV PPI, and multimodality pain control. Upper GI series to be done today.   DVT prophylaxis: On Heparin  drip     Code Status: Full Code Family Communication:  Family is at the bedside Status is: Inpatient Remains inpatient appropriate because: post op, on PCA pump    Subjective:  He is going for Upper GI study today. No acute events overnight.   Examination:  General exam: Appears to in mild distress secondary to abdominal pian. Respiratory system: Clear to auscultation. Respiratory effort normal. Cardiovascular system: S1 & S2 heard, RRR. No JVD, murmurs, rubs, gallops or clicks. No pedal edema. Gastrointestinal system: Incision looks clean, JP drain in place, hypoactive bowel sounds Central nervous system: Alert and oriented. No focal neurological deficits. Extremities: Symmetric 5 x 5 power. Skin: No rashes, lesions or ulcers Psychiatry: Judgement and insight appear normal. Mood & affect appropriate.     Diet Orders (From admission, onward)     Start     Ordered   04/29/24 0111  Diet NPO time specified  Diet effective now  04/29/24 0111            Objective: Vitals:   05/02/24 0351 05/02/24 0500 05/02/24 0800 05/02/24 0903  BP:  (!) 140/74  (!) 148/82  Pulse:  (!) 50  (!) 49  Resp: 16 15 15     Temp:  97.9 F (36.6 C)  98 F (36.7 C)  TempSrc:  Oral  Oral  SpO2: 98% 100% 100% 100%  Weight:      Height:        Intake/Output Summary (Last 24 hours) at 05/02/2024 1000 Last data filed at 05/02/2024 0934 Gross per 24 hour  Intake 484.08 ml  Output 2085 ml  Net -1600.92 ml   Filed Weights   04/28/24 1934  Weight: 85.3 kg    Scheduled Meds:  HYDROmorphone    Intravenous Q4H   lidocaine   2 patch Transdermal Q24H   pantoprazole  (PROTONIX ) IV  40 mg Intravenous Q12H   thiamine  (VITAMIN B1) injection  100 mg Intravenous Daily   Continuous Infusions:  sodium chloride  100 mL/hr at 05/02/24 0349   fluconazole  (DIFLUCAN ) IV 400 mg (05/02/24 0445)   heparin  2,600 Units/hr (05/02/24 0349)   piperacillin -tazobactam (ZOSYN )  IV 3.375 g (05/02/24 0550)    Nutritional status     Body mass index is 24.14 kg/m.  Data Reviewed:   CBC: Recent Labs  Lab 04/28/24 1800 04/28/24 1808 04/29/24 0626 04/30/24 0426 04/30/24 2337 05/02/24 0344  WBC 10.9*  --  6.8 8.7 8.0 6.0  NEUTROABS 9.9*  --   --   --   --   --   HGB 12.8* 13.3 13.9 13.0 12.4* 10.9*  HCT 37.9* 39.0 40.7 38.6* 37.2* 33.0*  MCV 86.9  --  86.8 86.9 88.2 88.5  PLT 259  --  239 221 207 184   Basic Metabolic Panel: Recent Labs  Lab 04/30/24 0426 04/30/24 1428 04/30/24 2337 05/01/24 0855 05/02/24 0344  NA 142 137 139 140 138  K 4.9 4.5 5.8* 4.4 4.5  CL 103 101 102 103 103  CO2 27 27 28 29 28   GLUCOSE 100* 92 89 81 82  BUN 30* 32* 28* 23* 15  CREATININE 1.72* 1.54* 1.43* 1.35* 1.20  CALCIUM  7.7* 8.2* 8.1* 7.9* 8.0*  MG 2.0  --   --   --  2.0  PHOS 7.6*  --   --   --   --    GFR: Estimated Creatinine Clearance: 86.6 mL/min (by C-G formula based on SCr of 1.2 mg/dL). Liver Function Tests: Recent Labs  Lab 04/28/24 1800 05/01/24 0855  AST 19 51*  ALT 22 37  ALKPHOS 98 47  BILITOT 1.8* 0.9  PROT 6.3* 5.4*  ALBUMIN 3.5 2.3*   Recent Labs  Lab 04/28/24 1800  LIPASE 51   No results for  input(s): AMMONIA in the last 168 hours. Coagulation Profile: No results for input(s): INR, PROTIME in the last 168 hours. Cardiac Enzymes: No results for input(s): CKTOTAL, CKMB, CKMBINDEX, TROPONINI in the last 168 hours. BNP (last 3 results) No results for input(s): PROBNP in the last 8760 hours. HbA1C: No results for input(s): HGBA1C in the last 72 hours. CBG: No results for input(s): GLUCAP in the last 168 hours. Lipid Profile: No results for input(s): CHOL, HDL, LDLCALC, TRIG, CHOLHDL, LDLDIRECT in the last 72 hours. Thyroid  Function Tests: No results for input(s): TSH, T4TOTAL, FREET4, T3FREE, THYROIDAB in the last 72 hours. Anemia Panel: No results for input(s): VITAMINB12, FOLATE, FERRITIN, TIBC, IRON, RETICCTPCT in the last  72 hours. Sepsis Labs: Recent Labs  Lab 04/28/24 1808 04/28/24 2020  LATICACIDVEN 0.9 1.0    No results found for this or any previous visit (from the past 240 hours).     Radiology Studies: No results found.      LOS: 4 days   Time spent= 41 mins    Deliliah Room, MD [facility]  If 7PM-7AM, please contact night-coverage  05/02/2024, 10:00 AM

## 2024-05-02 NOTE — Progress Notes (Signed)
 PHARMACY - ANTICOAGULATION CONSULT NOTE  Pharmacy Consult for heparin  Indication: ACS/Afib  Labs: Recent Labs    04/29/24 2159 04/29/24 2159 04/30/24 0426 04/30/24 1428 04/30/24 2337 05/01/24 0855 05/01/24 1543 05/02/24 0023 05/02/24 0344  HGB  --    < > 13.0  --  12.4*  --   --   --  10.9*  HCT  --   --  38.6*  --  37.2*  --   --   --  33.0*  PLT  --   --  221  --  207  --   --   --  184  APTT 43*  --   --   --   --   --   --   --   --   HEPARINUNFRC <0.10*  --  <0.10*   < > <0.10* 0.15* 0.18* 0.54 0.37  CREATININE  --   --  1.72*   < > 1.43* 1.35*  --   --  1.20   < > = values in this interval not displayed.   Assessment/Plan:  49yo male therapeutic on heparin  after rate change. Will continue infusion at current rate of 2600 units/hr and confirm stable with additional level.  8/29 AM update: HL 0.37 Hgb 10.9 PLT wnl No signs of bleeding or pauses with gtt  Plan: Continue heparin  at 2600 units/hr Daily HL / CBC Monitor for signs of bleeding F/up oral AC when appropriate   Raji Glinski BS, PharmD, BCPS Clinical Pharmacist 05/02/2024 7:27 AM  Contact: [phone] after 3 PM

## 2024-05-02 NOTE — Discharge Instructions (Signed)

## 2024-05-02 NOTE — Care Management Important Message (Signed)
 Important Message  Patient Details  Name: Samuel Flynn MRN: [id number] Date of Birth: [birth year]   Important Message Given:  Yes - Medicare IM     Jon Cruel 05/02/2024, 4:36 PM

## 2024-05-03 DIAGNOSIS — K668 Other specified disorders of peritoneum: Secondary | ICD-10-CM | POA: Diagnosis not present

## 2024-05-03 LAB — BASIC METABOLIC PANEL WITH GFR
Anion gap: 9 (ref 5–15)
BUN: 9 mg/dL (ref 6–20)
CO2: 25 mmol/L (ref 22–32)
Calcium: 7.8 mg/dL — ABNORMAL LOW (ref 8.9–10.3)
Chloride: 104 mmol/L (ref 98–111)
Creatinine, Ser: 1.07 mg/dL (ref 0.61–1.24)
GFR, Estimated: >60 mL/min (ref >60)
Glucose, Bld: 86 mg/dL (ref 70–99)
Potassium: 3.8 mmol/L (ref 3.5–5.1)
Sodium: 138 mmol/L (ref 135–145)

## 2024-05-03 LAB — CBC
HCT: 33.5 % — ABNORMAL LOW (ref 39.0–52.0)
Hemoglobin: 11.2 g/dL — ABNORMAL LOW (ref 13.0–17.0)
MCH: 29.4 pg (ref 26.0–34.0)
MCHC: 33.4 g/dL (ref 30.0–36.0)
MCV: 87.9 fL (ref 80.0–100.0)
Platelets: 189 K/uL (ref 150–400)
RBC: 3.81 MIL/uL — ABNORMAL LOW (ref 4.22–5.81)
RDW: 17.5 % — ABNORMAL HIGH (ref 11.5–15.5)
WBC: 5.6 K/uL (ref 4.0–10.5)
nRBC: 0 % (ref 0.0–0.2)

## 2024-05-03 LAB — HEPARIN LEVEL (UNFRACTIONATED): Heparin Unfractionated: 0.53 [IU]/mL (ref 0.30–0.70)

## 2024-05-03 NOTE — Progress Notes (Signed)
 PROGRESS NOTE    OTHO MICHALIK  FMW:[id number] DOB: 01/23/1976 DOA: 04/28/2024 PCP: [name], MD   Brief Narrative:   48 y.o. 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, truck driver by occupation, medical history significant for previous Roux-en-Y bypass in 2023, paroxysmal A-fib/flutter, failed ablations x 2, plan for third ablation on 06/30/2024, now on Xarelto  since 04/07/24, chronic sinus bradycardia, HTN, OSA on CPAP, hypocalcemia, hospitalized 03/31/2024 - 04/01/2024 for complaints of chest pain, palpitations and admitted for NSTEMI, LHC showed mild nonobstructive CAD, medical therapy was recommended, echo showed LVEF of 60 to 65% with no wall motion abnormalities, since then has followed up with EP cardiology on 04/22/2024, presented to ED on 8/25 with complaints of diffuse abdominal pain.  He was taken urgently to the OR and underwent laparoscopic, Arlyss patch repair of perforated marginal ulcer at the gastrojejunostomy on 8/25, started on dilaudid  PCA pump on 8/27. Upper GI study done on 8/29 is normal so he was started on a clear liquid diet.  Assessment & Plan:  Principal Problem:   Pneumoperitoneum   Acute kidney injury,POA: Likely pre-renal and contrast induced damage: Resolved Baseline creatinine 1-1.1 range.  Presented with creatinine of 1.25, peaked at 1.72. Normal Creatinine now. AKI, mild and multifactorial due to CT contrast and abdominal infection from perforated hollow viscus Not on ACEI or ARB PTA. Avoid nephrotoxics and hypotension Monitor BMP daily.     Paroxysmal atrial flutter/fibrillation S/p failed ablations x 2 Chronic sinus bradycardia Not on rate control medications PTA due to underlying sinus bradycardia.  Clinically in sinus rhythm On Xarelto  at home Currently on IV heparin  bridging initiated by general surgery, continue and can restart Xarelto  when able Outpatient follow-up with EP cardiology.  Reportedly has been scheduled for an ablation on 06/30/2024.    Essential hypertension Controlled off of meds at home Reasonably controlled here.  Monitor.   OSA: on CPAP Continue nightly CPAP   Recent NSTEMI LHC showed mild nonobstructive CAD and medical therapy recommended. No anginal symptoms Based on DC summary from 7/29, patient is supposed to be on aspirin  81 mg daily, rosuvastatin  20 mg daily but does not appear to be taking either. Encourage taking these medications and can be started when able.   Hypocalcemia F/u BMP in am   S/P Laparoscopic, Arlyss patch repair of perforated marginal ulcer at the gastrojejunostomy site, done on 8/25 Management per primary service.  Remains n.p.o., IVF, IV PPI, and multimodality pain control. Upper GI series to be done today.   DVT prophylaxis: On Heparin  drip     Code Status: Full Code Family Communication:  Family is at the bedside Status is: Inpatient Remains inpatient appropriate because: post op, on PCA pump    Subjective:  No acute events overnight. Pain is better controlled. Tolerating clear liquid diet well. No episodes of nausea or vomiting. Family is present at the bedside.   Examination:  General exam: Alert and awake, no distress noted Respiratory system: Clear to auscultation. Respiratory effort normal. Cardiovascular system: S1 & S2 heard, RRR. No JVD, murmurs, rubs, gallops or clicks. No pedal edema. Gastrointestinal system: Incision looks clean, JP drain in place, hypoactive bowel sounds Central nervous system: Alert and oriented. No focal neurological deficits. Extremities: Symmetric 5 x 5 power. Skin: No rashes, lesions or ulcers Psychiatry: Judgement and insight appear normal. Mood & affect appropriate.     Diet Orders (From admission, onward)     Start     Ordered   05/02/24 1606  Diet clear liquid  Room service appropriate? Yes; Fluid consistency: Thin  Diet effective now       Question Answer Comment  Room service appropriate? Yes   Fluid consistency: Thin       05/02/24 1605            Objective: Vitals:   05/02/24 1958 05/02/24 2315 05/03/24 0348 05/03/24 0747  BP: 133/77 138/76 (!) 149/88 (!) 154/74  Pulse: (!) 52  (!) 49 (!) 45  Resp: 18 17 17 15   Temp: 98.3 F (36.8 C)  97.7 F (36.5 C) 97.7 F (36.5 C)  TempSrc: Oral  Oral Oral  SpO2: 100% 97% 100% 100%  Weight:      Height:        Intake/Output Summary (Last 24 hours) at 05/03/2024 0932 Last data filed at 05/03/2024 0900 Gross per 24 hour  Intake 656.94 ml  Output 1725 ml  Net -1068.06 ml   Filed Weights   04/28/24 1934  Weight: 85.3 kg    Scheduled Meds:  lidocaine   2 patch Transdermal Q24H   methocarbamol   1,000 mg Oral Q8H   pantoprazole  (PROTONIX ) IV  40 mg Intravenous Q12H   thiamine  (VITAMIN B1) injection  100 mg Intravenous Daily   Continuous Infusions:  acetaminophen  1,000 mg (05/03/24 0655)   fluconazole  (DIFLUCAN ) IV 400 mg (05/03/24 0103)   heparin  2,600 Units/hr (05/03/24 0141)   piperacillin -tazobactam (ZOSYN )  IV 3.375 g (05/03/24 0541)    Nutritional status     Body mass index is 24.14 kg/m.  Data Reviewed:   CBC: Recent Labs  Lab 04/28/24 1800 04/28/24 1808 04/29/24 0626 04/30/24 0426 04/30/24 2337 05/02/24 0344 05/03/24 0613  WBC 10.9*  --  6.8 8.7 8.0 6.0 5.6  NEUTROABS 9.9*  --   --   --   --   --   --   HGB 12.8*   < > 13.9 13.0 12.4* 10.9* 11.2*  HCT 37.9*   < > 40.7 38.6* 37.2* 33.0* 33.5*  MCV 86.9  --  86.8 86.9 88.2 88.5 87.9  PLT 259  --  239 221 207 184 189   < > = values in this interval not displayed.   Basic Metabolic Panel: Recent Labs  Lab 04/30/24 0426 04/30/24 1428 04/30/24 2337 05/01/24 0855 05/02/24 0344 05/03/24 0613  NA 142 137 139 140 138 138  K 4.9 4.5 5.8* 4.4 4.5 3.8  CL 103 101 102 103 103 104  CO2 27 27 28 29 28 25   GLUCOSE 100* 92 89 81 82 86  BUN 30* 32* 28* 23* 15 9  CREATININE 1.72* 1.54* 1.43* 1.35* 1.20 1.07  CALCIUM  7.7* 8.2* 8.1* 7.9* 8.0* 7.8*  MG 2.0  --   --   --  2.0   --   PHOS 7.6*  --   --   --   --   --    GFR: Estimated Creatinine Clearance: 97.1 mL/min (by C-G formula based on SCr of 1.07 mg/dL). Liver Function Tests: Recent Labs  Lab 04/28/24 1800 05/01/24 0855  AST 19 51*  ALT 22 37  ALKPHOS 98 47  BILITOT 1.8* 0.9  PROT 6.3* 5.4*  ALBUMIN 3.5 2.3*   Recent Labs  Lab 04/28/24 1800  LIPASE 51   No results for input(s): AMMONIA in the last 168 hours. Coagulation Profile: No results for input(s): INR, PROTIME in the last 168 hours. Cardiac Enzymes: No results for input(s): CKTOTAL, CKMB, CKMBINDEX, TROPONINI in the last 168 hours. BNP (last 3 results)  No results for input(s): PROBNP in the last 8760 hours. HbA1C: No results for input(s): HGBA1C in the last 72 hours. CBG: No results for input(s): GLUCAP in the last 168 hours. Lipid Profile: No results for input(s): CHOL, HDL, LDLCALC, TRIG, CHOLHDL, LDLDIRECT in the last 72 hours. Thyroid  Function Tests: No results for input(s): TSH, T4TOTAL, FREET4, T3FREE, THYROIDAB in the last 72 hours. Anemia Panel: No results for input(s): VITAMINB12, FOLATE, FERRITIN, TIBC, IRON, RETICCTPCT in the last 72 hours. Sepsis Labs: Recent Labs  Lab 04/28/24 1808 04/28/24 2020  LATICACIDVEN 0.9 1.0    No results found for this or any previous visit (from the past 240 hours).     Radiology Studies: DG UGI W SINGLE CM (SOL OR THIN BA) Result Date: 05/02/2024 CLINICAL DATA:  History of Roux-en-Y bypass 2023. CT scan with pneumoperitoneum. Found to have perforated ulcer at the gastrojejunostomy. Now status post Arlyss patch repair at the gastrojejunostomy perforation site. Radiology consulted for or soluble upper GI study to evaluate for leak. EXAM: WATER SOLUBLE UPPER GI SERIES TECHNIQUE: Single-column upper GI series was performed using water soluble contrast. Radiation Exposure Index (as provided by the fluoroscopic device): 17.80 mGy Kerma  CONTRAST:  100 mL Omnipaque  300 COMPARISON:  None Available. FLUOROSCOPY: Radiation Exposure Index (if provided by the fluoroscopic device): 17.80 mGy Number of Acquired Spot Images: 4 FINDINGS: Anatomic changes status post prior Roux-en-Y noted. Water-soluble contrast traveled intraluminally through the stomach into the jejunum with no evidence of contrast leak from gastrojejunostomy anastomosis site. The jejunum is noted to be dilated with evidence of submucosal edema which could represent ileus. IMPRESSION: No evidence of leak from the gastrojejunostomy anastomosis site. Dilated jejunum with submucosal edema distal to the anastomosis likely reflects postoperative ileus. Electronically Signed   By: Wilkie Lent M.D.   On: 05/02/2024 14:50        LOS: 5 days   Time spent= 41 mins    Deliliah Room, MD [facility]  If 7PM-7AM, please contact night-coverage  05/03/2024, 9:32 AM

## 2024-05-03 NOTE — Progress Notes (Signed)
 Progress Note  5 Days Post-Op  Subjective: Patient report continued abdominal pain that is more prominent of right abdomen around drain. Denies worsening of pain. Feels pain is manageable with oral medications. Has had a bowel movement and reports flatulence. Tried minimal amount of clear liquids last night but had cramping.  ROS  All negative with the exception of above.  Objective: Vital signs in last 24 hours: Temp:  [97.4 F (36.3 C)-98.3 F (36.8 C)] 97.7 F (36.5 C) (08/30 0747) Pulse Rate:  [45-52] 45 (08/30 0747) Resp:  [15-19] 15 (08/30 0747) BP: (133-154)/(74-89) 154/74 (08/30 0747) SpO2:  [97 %-100 %] 100 % (08/30 0747) FiO2 (%):  [100 %] 100 % (08/29 1600) Last BM Date : 05/02/24  Intake/Output from previous day: 08/29 0701 - 08/30 0700 In: 656.9 [IV Piggyback:656.9] Out: 1870 [Urine:1400; Drains:470] Intake/Output this shift: Total I/O In: -  Out: 355 [Urine:250; Drains:105]  PE: General: Male who is laying in bed in NAD. HEENT: Head is normocephalic, atraumatic.  Sclera are noninjected. Conjunctiva anicteric. EOMI.  Lungs: Respiratory effort nonlabored. Abd: Soft with some distention. Generalized tenderness to palpation that is more prominent around right drain. Right drain in place. Bulb noted to have thin, pink fluid. Fluid is lighter in color than previous exams. Around >75 noted in bulb. Psych: A&Ox3 with an appropriate affect.    Lab Results:  Recent Labs    05/02/24 0344 05/03/24 0613  WBC 6.0 5.6  HGB 10.9* 11.2*  HCT 33.0* 33.5*  PLT 184 189   BMET Recent Labs    05/02/24 0344 05/03/24 0613  NA 138 138  K 4.5 3.8  CL 103 104  CO2 28 25  GLUCOSE 82 86  BUN 15 9  CREATININE 1.20 1.07  CALCIUM  8.0* 7.8*   PT/INR No results for input(s): LABPROT, INR in the last 72 hours. CMP     Component Value Date/Time   NA 138 05/03/2024 0613   NA 141 02/06/2024 1614   K 3.8 05/03/2024 0613   CL 104 05/03/2024 0613   CO2 25  05/03/2024 0613   GLUCOSE 86 05/03/2024 0613   BUN 9 05/03/2024 0613   BUN 11 02/06/2024 1614   CREATININE 1.07 05/03/2024 0613   CALCIUM  7.8 (L) 05/03/2024 0613   PROT 5.4 (L) 05/01/2024 0855   PROT 6.4 02/06/2024 1614   ALBUMIN 2.3 (L) 05/01/2024 0855   ALBUMIN 4.1 02/06/2024 1614   AST 51 (H) 05/01/2024 0855   ALT 37 05/01/2024 0855   ALKPHOS 47 05/01/2024 0855   BILITOT 0.9 05/01/2024 0855   BILITOT 0.9 02/06/2024 1614   GFRNONAA >60 05/03/2024 0613   GFRAA >60 01/28/2020 1946   Lipase     Component Value Date/Time   LIPASE 51 04/28/2024 1800       Studies/Results: DG UGI W SINGLE CM (SOL OR THIN BA) Result Date: 05/02/2024 CLINICAL DATA:  History of Roux-en-Y bypass 2023. CT scan with pneumoperitoneum. Found to have perforated ulcer at the gastrojejunostomy. Now status post Arlyss patch repair at the gastrojejunostomy perforation site. Radiology consulted for or soluble upper GI study to evaluate for leak. EXAM: WATER SOLUBLE UPPER GI SERIES TECHNIQUE: Single-column upper GI series was performed using water soluble contrast. Radiation Exposure Index (as provided by the fluoroscopic device): 17.80 mGy Kerma CONTRAST:  100 mL Omnipaque  300 COMPARISON:  None Available. FLUOROSCOPY: Radiation Exposure Index (if provided by the fluoroscopic device): 17.80 mGy Number of Acquired Spot Images: 4 FINDINGS: Anatomic changes status post prior  Roux-en-Y noted. Water-soluble contrast traveled intraluminally through the stomach into the jejunum with no evidence of contrast leak from gastrojejunostomy anastomosis site. The jejunum is noted to be dilated with evidence of submucosal edema which could represent ileus. IMPRESSION: No evidence of leak from the gastrojejunostomy anastomosis site. Dilated jejunum with submucosal edema distal to the anastomosis likely reflects postoperative ileus. Electronically Signed   By: Wilkie Lent M.D.   On: 05/02/2024 14:50     Anti-infectives: Anti-infectives (From admission, onward)    Start     Dose/Rate Route Frequency Ordered Stop   04/29/24 0215  piperacillin -tazobactam (ZOSYN ) IVPB 3.375 g        3.375 g 12.5 mL/hr over 240 Minutes Intravenous Every 8 hours 04/29/24 0122 05/04/24 0559   04/29/24 0115  fluconazole  (DIFLUCAN ) IVPB 400 mg        400 mg 100 mL/hr over 120 Minutes Intravenous Every 24 hours 04/29/24 0111     04/28/24 2000  piperacillin -tazobactam (ZOSYN ) IVPB 3.375 g        3.375 g 100 mL/hr over 30 Minutes Intravenous  Once 04/28/24 1956 04/28/24 2117        Assessment/Plan POD5 S/P Laparoscopy, diagnostic laparoscopic Arlyss patch repair placement of 19 French Blake drain by Dr. Rubin on 04/28/2024 due to free air of abdomen/perforated marginal ulcer at the gastrojejunostomy - Afebrile, Bradycardic. Hemodynamically stable - Drain is SS and output overall downtrending - would keep in place until taking solids and if remains SS likely remove prior to DC - UGI negative for leak. - Continue on CLD - Continue  IV abx - Continue PPI IV BID - Ordered H.Pylori testing; Still pending - Pain controlled with oral pain medications.     FEN: CLD; IVF per primary team VTE: SCDs, heparin  gtt restarted given recent MI ID: [email or web address] /[email or web address]     LOS: 5 days   I reviewed hospitalist notes, last 24 h vitals and pain scores, last 48 h intake and output, last 24 h labs and trends, and last 24 h imaging results.   Marjorie Carlyon Favre, [facility] 05/03/2024, 11:28 AM Please see Amion for pager number during day hours 7:00am-4:30pm

## 2024-05-03 NOTE — Progress Notes (Signed)
 PHARMACY - ANTICOAGULATION CONSULT NOTE  Pharmacy Consult for heparin  Indication: chest pain/ACS, atrial fibrillation  No Known Allergies  Patient Measurements: Height: 6' 2 (188 cm) Weight: 85.3 kg (188 lb 0.8 oz) IBW/kg (Calculated) : 82.2 HEPARIN  DW (KG): 85.3  Vital Signs: Temp: 97.7 F (36.5 C) (08/30 0747) Temp Source: Oral (08/30 0747) BP: 154/74 (08/30 0747) Pulse Rate: 45 (08/30 0747)  Labs: Recent Labs    04/30/24 2337 05/01/24 0855 05/01/24 1543 05/02/24 0023 05/02/24 0344 05/03/24 0613  HGB 12.4*  --   --   --  10.9* 11.2*  HCT 37.2*  --   --   --  33.0* 33.5*  PLT 207  --   --   --  184 189  HEPARINUNFRC <0.10* 0.15*   < > 0.54 0.37 0.53  CREATININE 1.43* 1.35*  --   --  1.20 1.07   < > = values in this interval not displayed.    Estimated Creatinine Clearance: 97.1 mL/min (by C-G formula based on SCr of 1.07 mg/dL).   Assessment: 47 yoM on xarelto  prior to admission presenting with abdominal pain found to have perforation at the gastrojejunostomy. Last dose Xarelto  8/25, reversed with Kcentra  and patient was taken for lararoscopic repair 8/25. Pharmacy consulted to manage heparin  infusion without bolus.    -heparin  level= 0.53 on 2600 units/hr, CBC stable  Goal of Therapy:  Heparin  level 0.3-0.7 units/ml (will target lower end of range) Monitor platelets by anticoagulation protocol: Yes   Plan:  -Continue heparin  2600 units/hr -Daily heparin  level and CBC  Prentice Poisson, PharmD Clinical Pharmacist **Pharmacist phone directory can now be found on [email or web address] ([email or web address]).  Listed under [facility].

## 2024-05-04 DIAGNOSIS — K668 Other specified disorders of peritoneum: Secondary | ICD-10-CM | POA: Diagnosis not present

## 2024-05-04 LAB — BASIC METABOLIC PANEL WITH GFR
Anion gap: 9 (ref 5–15)
BUN: 5 mg/dL — ABNORMAL LOW (ref 6–20)
CO2: 24 mmol/L (ref 22–32)
Calcium: 8.2 mg/dL — ABNORMAL LOW (ref 8.9–10.3)
Chloride: 109 mmol/L (ref 98–111)
Creatinine, Ser: 1.17 mg/dL (ref 0.61–1.24)
GFR, Estimated: >60 mL/min (ref >60)
Glucose, Bld: 87 mg/dL (ref 70–99)
Potassium: 3.7 mmol/L (ref 3.5–5.1)
Sodium: 142 mmol/L (ref 135–145)

## 2024-05-04 LAB — CBC
HCT: 34.2 % — ABNORMAL LOW (ref 39.0–52.0)
Hemoglobin: 11.3 g/dL — ABNORMAL LOW (ref 13.0–17.0)
MCH: 28.9 pg (ref 26.0–34.0)
MCHC: 33 g/dL (ref 30.0–36.0)
MCV: 87.5 fL (ref 80.0–100.0)
Platelets: 193 K/uL (ref 150–400)
RBC: 3.91 MIL/uL — ABNORMAL LOW (ref 4.22–5.81)
RDW: 17.6 % — ABNORMAL HIGH (ref 11.5–15.5)
WBC: 5.3 K/uL (ref 4.0–10.5)
nRBC: 0 % (ref 0.0–0.2)

## 2024-05-04 LAB — HEPARIN LEVEL (UNFRACTIONATED): Heparin Unfractionated: 0.57 [IU]/mL (ref 0.30–0.70)

## 2024-05-04 NOTE — Plan of Care (Signed)

## 2024-05-04 NOTE — Progress Notes (Signed)
 PROGRESS NOTE    Samuel Flynn  FMW:[id number] DOB: 1976/05/07 DOA: 04/28/2024 PCP: [name], MD   Brief Narrative:   48 y.o. 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, truck driver by occupation, medical history significant for previous Roux-en-Y bypass in 2023, paroxysmal A-fib/flutter, failed ablations x 2, plan for third ablation on 06/30/2024, now on Xarelto  since 04/07/24, chronic sinus bradycardia, HTN, OSA on CPAP, hypocalcemia, hospitalized 03/31/2024 - 04/01/2024 for complaints of chest pain, palpitations and admitted for NSTEMI, LHC showed mild nonobstructive CAD, medical therapy was recommended, echo showed LVEF of 60 to 65% with no wall motion abnormalities, since then has followed up with EP cardiology on 04/22/2024, presented to ED on 8/25 with complaints of diffuse abdominal pain.  He was taken urgently to the OR and underwent laparoscopic, Arlyss patch repair of perforated marginal ulcer at the gastrojejunostomy on 8/25, started on dilaudid  PCA pump on 8/27. Upper GI study done on 8/29 is normal so he was started on a clear liquid diet.  Assessment & Plan:  Principal Problem:   Pneumoperitoneum   Acute kidney injury,POA: Likely pre-renal and contrast induced damage: Resolved Baseline creatinine 1-1.1 range.  Presented with creatinine of 1.25, peaked at 1.72. Normal Creatinine now. AKI, mild and multifactorial due to CT contrast and abdominal infection from perforated hollow viscus Not on ACEI or ARB PTA. Avoid nephrotoxics and hypotension Monitor BMP daily.     Paroxysmal atrial flutter/fibrillation S/p failed ablations x 2 Chronic sinus bradycardia Not on rate control medications PTA due to underlying sinus bradycardia.  Clinically in sinus rhythm On Xarelto  at home Currently on IV heparin  bridging initiated by general surgery, continue and can restart Xarelto  when able Outpatient follow-up with EP cardiology.  Reportedly has been scheduled for an ablation on 06/30/2024.    Essential hypertension Controlled off of meds at home Reasonably controlled here.  Monitor.   OSA: on CPAP Continue nightly CPAP   Recent NSTEMI LHC showed mild nonobstructive CAD and medical therapy recommended. No anginal symptoms Based on DC summary from 7/29, patient is supposed to be on aspirin  81 mg daily, rosuvastatin  20 mg daily but does not appear to be taking either. Encourage taking these medications and can be started when able.   Hypocalcemia F/u BMP in am   S/P Laparoscopic, Arlyss patch repair of perforated marginal ulcer at the gastrojejunostomy site, done on 8/25 Management per surgery He will be started on a full liquid diet today. Prn analgesics. Off of dilaudid  PCA.  Upper GI series done and is within normal limits.   DVT prophylaxis: On Heparin  drip     Code Status: Full Code Family Communication:  Family is at the bedside Status is: Inpatient Remains inpatient appropriate because: post op, advancing diet, pain management    Subjective:  No acute events overnight. He is having BMs, passing flatus and tolerated clear liquid diet. His diet will be advanced to a full liquid diet today. He wants to go home as soon as possible.   Examination:  General exam: Alert and awake, no distress noted Respiratory system: Clear to auscultation. Respiratory effort normal. Cardiovascular system: S1 & S2 heard, RRR. No JVD, murmurs, rubs, gallops or clicks. No pedal edema. Gastrointestinal system: Incision looks clean, JP drain in place, hypoactive bowel sounds Central nervous system: Alert and oriented. No focal neurological deficits. Extremities: Symmetric 5 x 5 power. Skin: No rashes, lesions or ulcers Psychiatry: Judgement and insight appear normal. Mood & affect appropriate.     Diet Orders (From admission,  onward)     Start     Ordered   05/02/24 1606  Diet clear liquid Room service appropriate? Yes; Fluid consistency: Thin  Diet effective now        Question Answer Comment  Room service appropriate? Yes   Fluid consistency: Thin      05/02/24 1605            Objective: Vitals:   05/03/24 1552 05/03/24 1958 05/04/24 0519 05/04/24 0742  BP: (!) 155/89 131/73 (!) 147/79 (!) 144/76  Pulse: (!) 55 (!) 52 (!) 50 (!) 49  Resp: 18 18 18 16   Temp: 98.1 F (36.7 C) 98 F (36.7 C) 98.2 F (36.8 C) 97.8 F (36.6 C)  TempSrc: Oral Oral Oral Oral  SpO2: 100% 100% 99% 99%  Weight:      Height:        Intake/Output Summary (Last 24 hours) at 05/04/2024 0910 Last data filed at 05/04/2024 0600 Gross per 24 hour  Intake 325.75 ml  Output 370 ml  Net -44.25 ml   Filed Weights   04/28/24 1934  Weight: 85.3 kg    Scheduled Meds:  lidocaine   2 patch Transdermal Q24H   methocarbamol   1,000 mg Oral Q8H   pantoprazole  (PROTONIX ) IV  40 mg Intravenous Q12H   thiamine  (VITAMIN B1) injection  100 mg Intravenous Daily   Continuous Infusions:  fluconazole  (DIFLUCAN ) IV 400 mg (05/04/24 0054)   heparin  2,600 Units/hr (05/04/24 0251)    Nutritional status     Body mass index is 24.14 kg/m.  Data Reviewed:   CBC: Recent Labs  Lab 04/28/24 1800 04/28/24 1808 04/30/24 0426 04/30/24 2337 05/02/24 0344 05/03/24 0613 05/04/24 0431  WBC 10.9*   < > 8.7 8.0 6.0 5.6 5.3  NEUTROABS 9.9*  --   --   --   --   --   --   HGB 12.8*   < > 13.0 12.4* 10.9* 11.2* 11.3*  HCT 37.9*   < > 38.6* 37.2* 33.0* 33.5* 34.2*  MCV 86.9   < > 86.9 88.2 88.5 87.9 87.5  PLT 259   < > 221 207 184 189 193   < > = values in this interval not displayed.   Basic Metabolic Panel: Recent Labs  Lab 04/30/24 0426 04/30/24 1428 04/30/24 2337 05/01/24 0855 05/02/24 0344 05/03/24 0613 05/04/24 0431  NA 142   < > 139 140 138 138 142  K 4.9   < > 5.8* 4.4 4.5 3.8 3.7  CL 103   < > 102 103 103 104 109  CO2 27   < > 28 29 28 25 24   GLUCOSE 100*   < > 89 81 82 86 87  BUN 30*   < > 28* 23* 15 9 5*  CREATININE 1.72*   < > 1.43* 1.35* 1.20 1.07 1.17   CALCIUM  7.7*   < > 8.1* 7.9* 8.0* 7.8* 8.2*  MG 2.0  --   --   --  2.0  --   --   PHOS 7.6*  --   --   --   --   --   --    < > = values in this interval not displayed.   GFR: Estimated Creatinine Clearance: 88.8 mL/min (by C-G formula based on SCr of 1.17 mg/dL). Liver Function Tests: Recent Labs  Lab 04/28/24 1800 05/01/24 0855  AST 19 51*  ALT 22 37  ALKPHOS 98 47  BILITOT 1.8* 0.9  PROT  6.3* 5.4*  ALBUMIN 3.5 2.3*   Recent Labs  Lab 04/28/24 1800  LIPASE 51   No results for input(s): AMMONIA in the last 168 hours. Coagulation Profile: No results for input(s): INR, PROTIME in the last 168 hours. Cardiac Enzymes: No results for input(s): CKTOTAL, CKMB, CKMBINDEX, TROPONINI in the last 168 hours. BNP (last 3 results) No results for input(s): PROBNP in the last 8760 hours. HbA1C: No results for input(s): HGBA1C in the last 72 hours. CBG: No results for input(s): GLUCAP in the last 168 hours. Lipid Profile: No results for input(s): CHOL, HDL, LDLCALC, TRIG, CHOLHDL, LDLDIRECT in the last 72 hours. Thyroid  Function Tests: No results for input(s): TSH, T4TOTAL, FREET4, T3FREE, THYROIDAB in the last 72 hours. Anemia Panel: No results for input(s): VITAMINB12, FOLATE, FERRITIN, TIBC, IRON, RETICCTPCT in the last 72 hours. Sepsis Labs: Recent Labs  Lab 04/28/24 1808 04/28/24 2020  LATICACIDVEN 0.9 1.0    No results found for this or any previous visit (from the past 240 hours).     Radiology Studies: DG UGI W SINGLE CM (SOL OR THIN BA) Result Date: 05/02/2024 CLINICAL DATA:  History of Roux-en-Y bypass 2023. CT scan with pneumoperitoneum. Found to have perforated ulcer at the gastrojejunostomy. Now status post Arlyss patch repair at the gastrojejunostomy perforation site. Radiology consulted for or soluble upper GI study to evaluate for leak. EXAM: WATER SOLUBLE UPPER GI SERIES TECHNIQUE: Single-column upper GI  series was performed using water soluble contrast. Radiation Exposure Index (as provided by the fluoroscopic device): 17.80 mGy Kerma CONTRAST:  100 mL Omnipaque  300 COMPARISON:  None Available. FLUOROSCOPY: Radiation Exposure Index (if provided by the fluoroscopic device): 17.80 mGy Number of Acquired Spot Images: 4 FINDINGS: Anatomic changes status post prior Roux-en-Y noted. Water-soluble contrast traveled intraluminally through the stomach into the jejunum with no evidence of contrast leak from gastrojejunostomy anastomosis site. The jejunum is noted to be dilated with evidence of submucosal edema which could represent ileus. IMPRESSION: No evidence of leak from the gastrojejunostomy anastomosis site. Dilated jejunum with submucosal edema distal to the anastomosis likely reflects postoperative ileus. Electronically Signed   By: Wilkie Lent M.D.   On: 05/02/2024 14:50        LOS: 6 days   Time spent= 40 mins    Deliliah Room, MD [facility]  If 7PM-7AM, please contact night-coverage  05/04/2024, 9:10 AM

## 2024-05-04 NOTE — Progress Notes (Signed)
 Progress Note  6 Days Post-Op  Subjective: Patient reports pain is manageable and improving. Still having some tenderness that is more prominent around the right sided drain. Had CLD throughout the day and was able to tolerate without cramping, nausea, vomiting, and distention. Having Bms and passing flatus.   ROS  Per HPI  Objective: Vital signs in last 24 hours: Temp:  [97.8 F (36.6 C)-98.2 F (36.8 C)] 97.8 F (36.6 C) (08/31 0742) Pulse Rate:  [49-55] 49 (08/31 0742) Resp:  [16-18] 16 (08/31 0742) BP: (131-155)/(73-89) 144/76 (08/31 0742) SpO2:  [99 %-100 %] 99 % (08/31 0742) Last BM Date : 05/02/24  Intake/Output from previous day: 08/30 0701 - 08/31 0700 In: 325.8 [P.O.:120; IV Piggyback:205.8] Out: 725 [Urine:250; Drains:475] Intake/Output this shift: No intake/output data recorded.  PE: General: Pleasant male who is laying in bed in NAD. HEENT: Head is normocephalic, atraumatic.  Sclera are noninjected. Conjunctiva anicteric. EOMI.  Lungs: Respiratory effort nonlabored. Abd: Soft, ND. Generalized tenderness to palpation that is more prominent around right drain. Right drain in place. Bulb noted to have SS fluid. Fluid is lighter in color than previous exams. Around 50 noted in bulb. Psych: A&Ox3 with an appropriate affect.    Lab Results:  Recent Labs    05/03/24 0613 05/04/24 0431  WBC 5.6 5.3  HGB 11.2* 11.3*  HCT 33.5* 34.2*  PLT 189 193   BMET Recent Labs    05/03/24 0613 05/04/24 0431  NA 138 142  K 3.8 3.7  CL 104 109  CO2 25 24  GLUCOSE 86 87  BUN 9 5*  CREATININE 1.07 1.17  CALCIUM  7.8* 8.2*   PT/INR No results for input(s): LABPROT, INR in the last 72 hours. CMP     Component Value Date/Time   NA 142 05/04/2024 0431   NA 141 02/06/2024 1614   K 3.7 05/04/2024 0431   CL 109 05/04/2024 0431   CO2 24 05/04/2024 0431   GLUCOSE 87 05/04/2024 0431   BUN 5 (L) 05/04/2024 0431   BUN 11 02/06/2024 1614   CREATININE 1.17  05/04/2024 0431   CALCIUM  8.2 (L) 05/04/2024 0431   PROT 5.4 (L) 05/01/2024 0855   PROT 6.4 02/06/2024 1614   ALBUMIN 2.3 (L) 05/01/2024 0855   ALBUMIN 4.1 02/06/2024 1614   AST 51 (H) 05/01/2024 0855   ALT 37 05/01/2024 0855   ALKPHOS 47 05/01/2024 0855   BILITOT 0.9 05/01/2024 0855   BILITOT 0.9 02/06/2024 1614   GFRNONAA >60 05/04/2024 0431   GFRAA >60 01/28/2020 1946   Lipase     Component Value Date/Time   LIPASE 51 04/28/2024 1800       Studies/Results: DG UGI W SINGLE CM (SOL OR THIN BA) Result Date: 05/02/2024 CLINICAL DATA:  History of Roux-en-Y bypass 2023. CT scan with pneumoperitoneum. Found to have perforated ulcer at the gastrojejunostomy. Now status post Arlyss patch repair at the gastrojejunostomy perforation site. Radiology consulted for or soluble upper GI study to evaluate for leak. EXAM: WATER SOLUBLE UPPER GI SERIES TECHNIQUE: Single-column upper GI series was performed using water soluble contrast. Radiation Exposure Index (as provided by the fluoroscopic device): 17.80 mGy Kerma CONTRAST:  100 mL Omnipaque  300 COMPARISON:  None Available. FLUOROSCOPY: Radiation Exposure Index (if provided by the fluoroscopic device): 17.80 mGy Number of Acquired Spot Images: 4 FINDINGS: Anatomic changes status post prior Roux-en-Y noted. Water-soluble contrast traveled intraluminally through the stomach into the jejunum with no evidence of contrast leak from gastrojejunostomy anastomosis  site. The jejunum is noted to be dilated with evidence of submucosal edema which could represent ileus. IMPRESSION: No evidence of leak from the gastrojejunostomy anastomosis site. Dilated jejunum with submucosal edema distal to the anastomosis likely reflects postoperative ileus. Electronically Signed   By: Wilkie Lent M.D.   On: 05/02/2024 14:50    Anti-infectives: Anti-infectives (From admission, onward)    Start     Dose/Rate Route Frequency Ordered Stop   04/29/24 0215   piperacillin -tazobactam (ZOSYN ) IVPB 3.375 g        3.375 g 12.5 mL/hr over 240 Minutes Intravenous Every 8 hours 04/29/24 0122 05/04/24 0147   04/29/24 0115  fluconazole  (DIFLUCAN ) IVPB 400 mg        400 mg 100 mL/hr over 120 Minutes Intravenous Every 24 hours 04/29/24 0111     04/28/24 2000  piperacillin -tazobactam (ZOSYN ) IVPB 3.375 g        3.375 g 100 mL/hr over 30 Minutes Intravenous  Once 04/28/24 1956 04/28/24 2117        Assessment/Plan POD6 S/P Laparoscopy, diagnostic laparoscopic Arlyss patch repair placement of 19 French Blake drain by Dr. Rubin on 04/28/2024 due to free air of abdomen/perforated marginal ulcer at the gastrojejunostomy - Afebrile, Bradycardic, Mild hypertention. Hemodynamically stable - Drain with SS fluid. Recommend keeping in place until taking solids and if remains SS likely remove prior to DC - UGI 05/02/2024 negative for leak. - Advance to FLD - Continue IV abx - Continue PPI IV BID - Ordered H.Pylori testing; Still pending - Pain controlled with oral pain medications.     FEN: FLD; IVF per primary team VTE: SCDs, heparin  gtt restarted given recent MI ID: Zosyn /diflucan     LOS: 6 days   I reviewed hospitalist notes, last 24 h vitals and pain scores, last 48 h intake and output, last 24 h labs and trends, and last 24 h imaging results.   Marjorie Carlyon Favre, [facility] 05/04/2024, 7:59 AM Please see Amion for pager number during day hours 7:00am-4:30pm

## 2024-05-04 NOTE — Progress Notes (Signed)
 PHARMACY - ANTICOAGULATION CONSULT NOTE  Pharmacy Consult for heparin  Indication: atrial fibrillation  No Known Allergies  Patient Measurements: Height: 6' 2 (188 cm) Weight: 85.3 kg (188 lb 0.8 oz) IBW/kg (Calculated) : 82.2 HEPARIN  DW (KG): 85.3  Vital Signs: Temp: 97.8 F (36.6 C) (08/31 0742) Temp Source: Oral (08/31 0742) BP: 144/76 (08/31 0742) Pulse Rate: 49 (08/31 0742)  Labs: Recent Labs    05/02/24 0344 05/03/24 0613 05/04/24 0431 05/04/24 1248  HGB 10.9* 11.2* 11.3*  --   HCT 33.0* 33.5* 34.2*  --   PLT 184 189 193  --   HEPARINUNFRC 0.37 0.53  --  0.57  CREATININE 1.20 1.07 1.17  --     Estimated Creatinine Clearance: 88.8 mL/min (by C-G formula based on SCr of 1.17 mg/dL).   Assessment: 47 yoM on xarelto  prior to admission presenting with abdominal pain found to have perforation at the gastrojejunostomy. Last dose Xarelto  8/25, reversed with Kcentra  and patient was taken for lararoscopic repair 8/25. Pharmacy consulted to manage heparin  infusion without bolus.   Heparin  level= 0.57 on 2600 units/hr, CBC stable  Goal of Therapy:  Heparin  level 0.3-0.7 units/ml (will target lower end of range) Monitor platelets by anticoagulation protocol: Yes   Plan:  -Continue heparin  2600 units/hr -Daily heparin  level and CBC   Ashely Goosby, Pharm.D., BCPS Clinical Pharmacist  **Pharmacist phone directory can be found on [email or web address] listed under [facility].  05/04/2024 1:49 PM

## 2024-05-05 DIAGNOSIS — K668 Other specified disorders of peritoneum: Secondary | ICD-10-CM | POA: Diagnosis not present

## 2024-05-05 LAB — CBC
HCT: 35.2 % — ABNORMAL LOW (ref 39.0–52.0)
Hemoglobin: 11.6 g/dL — ABNORMAL LOW (ref 13.0–17.0)
MCH: 29 pg (ref 26.0–34.0)
MCHC: 33 g/dL (ref 30.0–36.0)
MCV: 88 fL (ref 80.0–100.0)
Platelets: 205 K/uL (ref 150–400)
RBC: 4 MIL/uL — ABNORMAL LOW (ref 4.22–5.81)
RDW: 17.8 % — ABNORMAL HIGH (ref 11.5–15.5)
WBC: 5.5 K/uL (ref 4.0–10.5)
nRBC: 0 % (ref 0.0–0.2)

## 2024-05-05 LAB — H. PYLORI ANTIGEN, STOOL: H. Pylori Stool Ag, Eia: NEGATIVE

## 2024-05-05 LAB — HEPARIN LEVEL (UNFRACTIONATED): Heparin Unfractionated: 0.66 [IU]/mL (ref 0.30–0.70)

## 2024-05-05 NOTE — Progress Notes (Signed)
 Progress Note  7 Days Post-Op  Subjective: Patient reports pain is minimal and well controlled. Having bowel movements yesterday. None today yet. Reports flatulence. Tolerating CLD. Patient states that he did not receive food yesterday until 5:00PM. Accordingly, he had chicken nuggets from outside of hospital. Feels that he tolerated this well without nausea, vomiting, increased distention, and abdominal pain.  ROS  Per HPI  Objective: Vital signs in last 24 hours: Temp:  [97.7 F (36.5 C)-98.4 F (36.9 C)] 98.4 F (36.9 C) (09/01 0827) Pulse Rate:  [53-58] 58 (09/01 0827) Resp:  [15-18] 17 (09/01 0827) BP: (130-150)/(72-85) 135/85 (09/01 0827) SpO2:  [100 %] 100 % (09/01 0827) Last BM Date : 05/02/24  Intake/Output from previous day: 08/31 0701 - 09/01 0700 In: 350 [P.O.:350] Out: 320 [Drains:320] Intake/Output this shift: No intake/output data recorded.  PE: General: Pleasant male who is laying in bed in NAD. HEENT: Head is normocephalic, atraumatic.  Sclera are noninjected. Conjunctiva anicteric. EOMI.  Lungs: Respiratory effort nonlabored. Abd: Soft, NT. Right drain in place. Bulb noted to have about 75 SS fluid. Fluid is lighter in color than previous exams. No rebound tenderness or guarding. Psych: A&Ox3 with an appropriate affect.    Lab Results:  Recent Labs    05/04/24 0431 05/05/24 0707  WBC 5.3 5.5  HGB 11.3* 11.6*  HCT 34.2* 35.2*  PLT 193 205   BMET Recent Labs    05/03/24 0613 05/04/24 0431  NA 138 142  K 3.8 3.7  CL 104 109  CO2 25 24  GLUCOSE 86 87  BUN 9 5*  CREATININE 1.07 1.17  CALCIUM  7.8* 8.2*   PT/INR No results for input(s): LABPROT, INR in the last 72 hours. CMP     Component Value Date/Time   NA 142 05/04/2024 0431   NA 141 02/06/2024 1614   K 3.7 05/04/2024 0431   CL 109 05/04/2024 0431   CO2 24 05/04/2024 0431   GLUCOSE 87 05/04/2024 0431   BUN 5 (L) 05/04/2024 0431   BUN 11 02/06/2024 1614   CREATININE 1.17  05/04/2024 0431   CALCIUM  8.2 (L) 05/04/2024 0431   PROT 5.4 (L) 05/01/2024 0855   PROT 6.4 02/06/2024 1614   ALBUMIN 2.3 (L) 05/01/2024 0855   ALBUMIN 4.1 02/06/2024 1614   AST 51 (H) 05/01/2024 0855   ALT 37 05/01/2024 0855   ALKPHOS 47 05/01/2024 0855   BILITOT 0.9 05/01/2024 0855   BILITOT 0.9 02/06/2024 1614   GFRNONAA >60 05/04/2024 0431   GFRAA >60 01/28/2020 1946   Lipase     Component Value Date/Time   LIPASE 51 04/28/2024 1800       Studies/Results: No results found.  Anti-infectives: Anti-infectives (From admission, onward)    Start     Dose/Rate Route Frequency Ordered Stop   04/29/24 0215  piperacillin -tazobactam (ZOSYN ) IVPB 3.375 g        3.375 g 12.5 mL/hr over 240 Minutes Intravenous Every 8 hours 04/29/24 0122 05/04/24 0147   04/29/24 0115  fluconazole  (DIFLUCAN ) IVPB 400 mg        400 mg 100 mL/hr over 120 Minutes Intravenous Every 24 hours 04/29/24 0111     04/28/24 2000  piperacillin -tazobactam (ZOSYN ) IVPB 3.375 g        3.375 g 100 mL/hr over 30 Minutes Intravenous  Once 04/28/24 1956 04/28/24 2117        Assessment/Plan POD7: S/P Laparoscopy, diagnostic laparoscopic Arlyss patch repair placement of 19 French Blake drain by Dr.  Rubin on 04/28/2024 due to free air of abdomen/perforated marginal ulcer at the gastrojejunostomy - Afebrile, Bradycardic, Hemodynamically stable - Drain with SS fluid. Recommend keeping in place until taking solids and if remains SS likely remove prior to DC - UGI 05/02/2024 negative for leak. - Advance to soft diet. - Continue IV abx - Continue PPI IV BID - H. Pylori testing negative from 04/29/2024 - Pain controlled with oral pain medications.     FEN: Soft; IVF per primary team VTE: SCDs, heparin  gtt restarted given recent MI ID: Zosyn /diflucan     LOS: 7 days   I reviewed nursing notes, hospitalist notes, last 24 h vitals and pain scores, last 48 h intake and output, last 24 h labs and trends, and last 24  h imaging results.   Marjorie Carlyon Favre, [facility] 05/05/2024, 8:31 AM Please see Amion for pager number during day hours 7:00am-4:30pm

## 2024-05-05 NOTE — Progress Notes (Addendum)
 PROGRESS NOTE    Samuel Flynn  FMW:[id number] DOB: [birth year] DOA: 04/28/2024 PCP: [name], MD   Brief Narrative:   48 y.o. male 47 year old married male, truck driver by occupation, medical history significant for previous Roux-en-Y bypass in 2023, paroxysmal A-fib/flutter, failed ablations x 2, plan for third ablation on 06/30/2024, now on Xarelto  since 04/07/24, chronic sinus bradycardia, HTN, OSA on CPAP, hypocalcemia, hospitalized 03/31/2024 - 04/01/2024 for complaints of chest pain, palpitations and admitted for NSTEMI, LHC showed mild nonobstructive CAD, medical therapy was recommended, echo showed LVEF of 60 to 65% with no wall motion abnormalities, since then has followed up with EP cardiology on 04/22/2024, presented to ED on 8/25 with complaints of diffuse abdominal pain.  He was taken urgently to the OR and underwent laparoscopic, Arlyss patch repair of perforated marginal ulcer at the gastrojejunostomy on 8/25, started on dilaudid  PCA pump on 8/27. Upper GI study done on 8/29 is normal. He is on a soft diet now. Assessment & Plan:  Principal Problem:   Pneumoperitoneum   Acute kidney injury,POA: Likely pre-renal and contrast induced damage: Resolved Baseline creatinine 1-1.1 range.  Presented with creatinine of 1.25, peaked at 1.72. Normal Creatinine now. AKI, mild and multifactorial due to CT contrast and abdominal infection from perforated hollow viscus Not on ACEI or ARB PTA. Avoid nephrotoxics and hypotension Monitor BMP daily.   Off of IVF   Paroxysmal atrial flutter/fibrillation S/p failed ablations x 2 Chronic sinus bradycardia Not on rate control medications PTA due to underlying sinus bradycardia.  Clinically, he is in sinus rhythm On Xarelto  at home Currently on IV heparin  bridging initiated by general surgery, continue and can restart Xarelto  when able Outpatient follow-up with EP cardiology.  Reportedly has been scheduled for an ablation on 06/30/2024.    Essential hypertension Controlled off of meds at home Reasonably controlled here.  Monitor.   OSA: on CPAP Continue nightly CPAP   Recent NSTEMI LHC showed mild nonobstructive CAD and medical therapy recommended. No anginal symptoms Based on DC summary from 7/29, patient is supposed to be on aspirin  81 mg daily, rosuvastatin  20 mg daily but does not appear to be taking either. Encourage taking these medications and can be started when able.   Hypocalcemia F/u BMP in am   S/P Laparoscopic, Arlyss patch repair of perforated marginal ulcer at the gastrojejunostomy site, done on 8/25 Management per surgery He will be started on a soft diet today. Prn analgesics. Off of dilaudid  PCA.  Upper GI series done and is within normal limits.   DVT prophylaxis: On Heparin  drip     Code Status: Full Code Family Communication:  Family is at the bedside Status is: Inpatient Remains inpatient appropriate because: post op, advancing diet, pain management    Subjective:  He did eat chicken nuggets from outside and tolerated them. Pain is well controlled. Having bowel movements and passing flatus. Still on heparin  drip and will likely be transitioned to OAC today.   Examination:  General exam: Alert and awake, no distress noted Respiratory system: Clear to auscultation. Respiratory effort normal. Cardiovascular system: S1 & S2 heard, RRR. No JVD, murmurs, rubs, gallops or clicks. No pedal edema. Gastrointestinal system: Incision looks clean, JP drain in place, hypoactive bowel sounds Central nervous system: Alert and oriented. No focal neurological deficits. Extremities: Symmetric 5 x 5 power. Skin: No rashes, lesions or ulcers Psychiatry: Judgement and insight appear normal. Mood & affect appropriate.     Diet Orders (From admission, onward)  Start     Ordered   05/05/24 0841  DIET SOFT Room service appropriate? Yes; Fluid consistency: Thin  Diet effective now       Question  Answer Comment  Room service appropriate? Yes   Fluid consistency: Thin      05/05/24 0840            Objective: Vitals:   05/04/24 1543 05/04/24 1947 05/05/24 0430 05/05/24 0827  BP: (!) 140/84 (!) 150/72 130/76 135/85  Pulse: (!) 53 (!) 56  (!) 58  Resp: 15 16 18 17   Temp: 97.9 F (36.6 C) 97.8 F (36.6 C) 97.7 F (36.5 C) 98.4 F (36.9 C)  TempSrc: Oral Oral Oral Oral  SpO2: 100% 100% 100% 100%  Weight:      Height:        Intake/Output Summary (Last 24 hours) at 05/05/2024 0901 Last data filed at 05/05/2024 0300 Gross per 24 hour  Intake 350 ml  Output 320 ml  Net 30 ml   Filed Weights   04/28/24 1934  Weight: 85.3 kg    Scheduled Meds:  lidocaine   2 patch Transdermal Q24H   methocarbamol   1,000 mg Oral Q8H   pantoprazole  (PROTONIX ) IV  40 mg Intravenous Q12H   thiamine  (VITAMIN B1) injection  100 mg Intravenous Daily   Continuous Infusions:  fluconazole  (DIFLUCAN ) IV 400 mg (05/05/24 0121)   heparin  2,600 Units/hr (05/05/24 0135)    Nutritional status     Body mass index is 24.14 kg/m.  Data Reviewed:   CBC: Recent Labs  Lab 04/28/24 1800 04/28/24 1808 04/30/24 2337 05/02/24 0344 05/03/24 0613 05/04/24 0431 05/05/24 0707  WBC 10.9*   < > 8.0 6.0 5.6 5.3 5.5  NEUTROABS 9.9*  --   --   --   --   --   --   HGB 12.8*   < > 12.4* 10.9* 11.2* 11.3* 11.6*  HCT 37.9*   < > 37.2* 33.0* 33.5* 34.2* 35.2*  MCV 86.9   < > 88.2 88.5 87.9 87.5 88.0  PLT 259   < > 207 184 189 193 205   < > = values in this interval not displayed.   Basic Metabolic Panel: Recent Labs  Lab 04/30/24 0426 04/30/24 1428 04/30/24 2337 05/01/24 0855 05/02/24 0344 05/03/24 0613 05/04/24 0431  NA 142   < > 139 140 138 138 142  K 4.9   < > 5.8* 4.4 4.5 3.8 3.7  CL 103   < > 102 103 103 104 109  CO2 27   < > 28 29 28 25 24   GLUCOSE 100*   < > 89 81 82 86 87  BUN 30*   < > 28* 23* 15 9 5*  CREATININE 1.72*   < > 1.43* 1.35* 1.20 1.07 1.17  CALCIUM  7.7*   < > 8.1*  7.9* 8.0* 7.8* 8.2*  MG 2.0  --   --   --  2.0  --   --   PHOS 7.6*  --   --   --   --   --   --    < > = values in this interval not displayed.   GFR: Estimated Creatinine Clearance: 88.8 mL/min (by C-G formula based on SCr of 1.17 mg/dL). Liver Function Tests: Recent Labs  Lab 04/28/24 1800 05/01/24 0855  AST 19 51*  ALT 22 37  ALKPHOS 98 47  BILITOT 1.8* 0.9  PROT 6.3* 5.4*  ALBUMIN 3.5 2.3*  Recent Labs  Lab 04/28/24 1800  LIPASE 51   No results for input(s): AMMONIA in the last 168 hours. Coagulation Profile: No results for input(s): INR, PROTIME in the last 168 hours. Cardiac Enzymes: No results for input(s): CKTOTAL, CKMB, CKMBINDEX, TROPONINI in the last 168 hours. BNP (last 3 results) No results for input(s): PROBNP in the last 8760 hours. HbA1C: No results for input(s): HGBA1C in the last 72 hours. CBG: No results for input(s): GLUCAP in the last 168 hours. Lipid Profile: No results for input(s): CHOL, HDL, LDLCALC, TRIG, CHOLHDL, LDLDIRECT in the last 72 hours. Thyroid  Function Tests: No results for input(s): TSH, T4TOTAL, FREET4, T3FREE, THYROIDAB in the last 72 hours. Anemia Panel: No results for input(s): VITAMINB12, FOLATE, FERRITIN, TIBC, IRON, RETICCTPCT in the last 72 hours. Sepsis Labs: Recent Labs  Lab 04/28/24 1808 04/28/24 2020  LATICACIDVEN 0.9 1.0    No results found for this or any previous visit (from the past 240 hours).     Radiology Studies: No results found.       LOS: 7 days   Time spent= 40 mins    Deliliah Room, MD [facility]  If 7PM-7AM, please contact night-coverage  05/05/2024, 9:01 AM

## 2024-05-05 NOTE — Plan of Care (Signed)
   Problem: Education: Goal: Knowledge of General Education information will improve Description Including pain rating scale, medication(s)/side effects and non-pharmacologic comfort measures Outcome: Progressing   Problem: Health Behavior/Discharge Planning: Goal: Ability to manage health-related needs will improve Outcome: Progressing

## 2024-05-05 NOTE — Progress Notes (Signed)
 Patient is tolerating Soft diet, no n/v or pain. Patient also had two bowel movements.

## 2024-05-05 NOTE — Plan of Care (Signed)
  Problem: Clinical Measurements: Goal: Ability to maintain clinical measurements within normal limits will improve Outcome: Progressing Goal: Will remain free from infection Outcome: Progressing   Problem: Pain Managment: Goal: General experience of comfort will improve and/or be controlled Outcome: Progressing   Problem: Activity: Goal: Risk for activity intolerance will decrease Outcome: Completed/Met   Problem: Coping: Goal: Level of anxiety will decrease Outcome: Completed/Met   Problem: Elimination: Goal: Will not experience complications related to bowel motility Outcome: Completed/Met

## 2024-05-05 NOTE — Progress Notes (Signed)
 PHARMACY - ANTICOAGULATION CONSULT NOTE  Pharmacy Consult for heparin  Indication: atrial fibrillation  No Known Allergies  Patient Measurements: Height: 6' 2 (188 cm) Weight: 85.3 kg (188 lb 0.8 oz) IBW/kg (Calculated) : 82.2 HEPARIN  DW (KG): 85.3  Vital Signs: Temp: 98.4 F (36.9 C) (09/01 0827) Temp Source: Oral (09/01 0827) BP: 135/85 (09/01 0827) Pulse Rate: 58 (09/01 0827)  Labs: Recent Labs    05/03/24 0613 05/04/24 0431 05/04/24 1248 05/05/24 0707  HGB 11.2* 11.3*  --  11.6*  HCT 33.5* 34.2*  --  35.2*  PLT 189 193  --  205  HEPARINUNFRC 0.53  --  0.57 0.66  CREATININE 1.07 1.17  --   --     Estimated Creatinine Clearance: 88.8 mL/min (by C-G formula based on SCr of 1.17 mg/dL).   Assessment: 47 yoM on xarelto  prior to admission presenting with abdominal pain found to have perforation at the gastrojejunostomy. Last dose Xarelto  8/25, reversed with Kcentra  and patient was taken for lararoscopic repair 8/25. Pharmacy consulted to manage heparin  infusion without bolus.   Heparin  level therapeutic (0.66) on 2600 units/hr, CBC stable  Goal of Therapy:  Heparin  level 0.3-0.7 units/ml (will target lower end of range) Monitor platelets by anticoagulation protocol: Yes   Plan:  -Continue heparin  2600 units/hr -Daily heparin  level and CBC -F/u ability to restart home Xarelto   Vito Ralph, PharmD, BCPS Please see amion for complete clinical pharmacist phone list 05/05/2024 1:39 PM

## 2024-05-06 DIAGNOSIS — K668 Other specified disorders of peritoneum: Secondary | ICD-10-CM | POA: Diagnosis not present

## 2024-05-06 LAB — CBC
HCT: 34 % — ABNORMAL LOW (ref 39.0–52.0)
Hemoglobin: 11.3 g/dL — ABNORMAL LOW (ref 13.0–17.0)
MCH: 29 pg (ref 26.0–34.0)
MCHC: 33.2 g/dL (ref 30.0–36.0)
MCV: 87.4 fL (ref 80.0–100.0)
Platelets: 205 K/uL (ref 150–400)
RBC: 3.89 MIL/uL — ABNORMAL LOW (ref 4.22–5.81)
RDW: 17.9 % — ABNORMAL HIGH (ref 11.5–15.5)
WBC: 5.2 K/uL (ref 4.0–10.5)
nRBC: 0 % (ref 0.0–0.2)

## 2024-05-06 LAB — HEPARIN LEVEL (UNFRACTIONATED): Heparin Unfractionated: 0.79 [IU]/mL — ABNORMAL HIGH (ref 0.30–0.70)

## 2024-05-06 MED ORDER — THIAMINE MONONITRATE 100 MG PO TABS
100.0000 mg | ORAL_TABLET | Freq: Every day | ORAL | Status: DC
  Administered 2024-05-06: 100 mg via ORAL
  Filled 2024-05-06: qty 1

## 2024-05-06 MED ORDER — OXYCODONE HCL 5 MG PO TABS: 5.0000 mg | ORAL_TABLET | ORAL | 0 refills | Status: DC | PRN

## 2024-05-06 MED ORDER — RIVAROXABAN 20 MG PO TABS
20.0000 mg | ORAL_TABLET | Freq: Every day | ORAL | Status: DC
  Administered 2024-05-06: 20 mg via ORAL
  Filled 2024-05-06: qty 1

## 2024-05-06 MED ORDER — VITAMIN B-1 100 MG PO TABS: 100.0000 mg | ORAL_TABLET | Freq: Every day | ORAL | 0 refills | Status: AC

## 2024-05-06 MED ORDER — METHOCARBAMOL 500 MG PO TABS: 500.0000 mg | ORAL_TABLET | Freq: Three times a day (TID) | ORAL | 0 refills | Status: DC | PRN

## 2024-05-06 MED ORDER — PANTOPRAZOLE SODIUM 40 MG PO TBEC: 40.0000 mg | DELAYED_RELEASE_TABLET | Freq: Two times a day (BID) | ORAL | 0 refills | Status: AC

## 2024-05-06 MED ORDER — PANTOPRAZOLE SODIUM 40 MG PO TBEC
40.0000 mg | DELAYED_RELEASE_TABLET | Freq: Two times a day (BID) | ORAL | Status: DC
  Administered 2024-05-06: 40 mg via ORAL
  Filled 2024-05-06: qty 1

## 2024-05-06 NOTE — Progress Notes (Signed)
 PHARMACY - ANTICOAGULATION CONSULT NOTE  Pharmacy Consult for heparin  Indication: atrial fibrillation  No Known Allergies  Patient Measurements: Height: 6' 2 (188 cm) Weight: 85.3 kg (188 lb 0.8 oz) IBW/kg (Calculated) : 82.2 HEPARIN  DW (KG): 85.3  Vital Signs: Temp: 98 F (36.7 C) (09/01 2045) Temp Source: Oral (09/01 2045) BP: 147/78 (09/01 2045) Pulse Rate: 46 (09/01 2045)  Labs: Recent Labs    05/04/24 0431 05/04/24 1248 05/05/24 0707 05/06/24 0632  HGB 11.3*  --  11.6* 11.3*  HCT 34.2*  --  35.2* 34.0*  PLT 193  --  205 205  HEPARINUNFRC  --  0.57 0.66 0.79*  CREATININE 1.17  --   --   --     Estimated Creatinine Clearance: 88.8 mL/min (by C-G formula based on SCr of 1.17 mg/dL).   Assessment:  47 yoM on Xarelto  TPA presenting with abdominal pain found to have perforation at the gastrojejunostomy. Last dose Xarelto  8/25, reversed with KCentra  and patient was taken for lararoscopic repair 8/25. Pharmacy consulted to manage heparin  infusion without bolus.   Heparin  level is supra-therapeutic at 0.79 units/mL.  Lab was drawn appropriately and no bleeding noted by RN.  Goal of Therapy:  Heparin  level 0.3-0.7 units/ml (will target lower end of range) Monitor platelets by anticoagulation protocol: Yes   Plan:  Reduce heparin  gtt to 2400 units/hr Check 6 hr heparin  level Daily heparin  level and CBC F/u with transitioning back to PTA Xarelto   Jas Betten D. Lendell, PharmD, BCPS, BCCCP 05/06/2024, 8:05 AM

## 2024-05-06 NOTE — Progress Notes (Signed)
 Progress Note  8 Days Post-Op  Subjective: No new issues.  Pain well controlled.  Moving his bowels.  Tolerating soft diet with no nausea or vomiting  Objective: Vital signs in last 24 hours: Temp:  [97.8 F (36.6 C)-98.3 F (36.8 C)] 97.8 F (36.6 C) (09/02 0757) Pulse Rate:  [46-91] 55 (09/02 0757) Resp:  [16-18] 18 (09/02 0757) BP: (141-147)/(78-81) 141/81 (09/02 0757) SpO2:  [91 %-100 %] 100 % (09/02 0757) Last BM Date : 05/05/24 (3BM on PM shift)  Intake/Output from previous day: 09/01 0701 - 09/02 0700 In: 700 [P.O.:700] Out: 230 [Drains:230] Intake/Output this shift: No intake/output data recorded.  PE: Lungs: Respiratory effort nonlabored. Abd: Soft, minimally tender around incisions, JP with serosang output.  Incisions c/d/i    Lab Results:  Recent Labs    05/05/24 0707 05/06/24 0632  WBC 5.5 5.2  HGB 11.6* 11.3*  HCT 35.2* 34.0*  PLT 205 205   BMET Recent Labs    05/04/24 0431  NA 142  K 3.7  CL 109  CO2 24  GLUCOSE 87  BUN 5*  CREATININE 1.17  CALCIUM  8.2*   PT/INR No results for input(s): LABPROT, INR in the last 72 hours. CMP     Component Value Date/Time   NA 142 05/04/2024 0431   NA 141 02/06/2024 1614   K 3.7 05/04/2024 0431   CL 109 05/04/2024 0431   CO2 24 05/04/2024 0431   GLUCOSE 87 05/04/2024 0431   BUN 5 (L) 05/04/2024 0431   BUN 11 02/06/2024 1614   CREATININE 1.17 05/04/2024 0431   CALCIUM  8.2 (L) 05/04/2024 0431   PROT 5.4 (L) 05/01/2024 0855   PROT 6.4 02/06/2024 1614   ALBUMIN 2.3 (L) 05/01/2024 0855   ALBUMIN 4.1 02/06/2024 1614   AST 51 (H) 05/01/2024 0855   ALT 37 05/01/2024 0855   ALKPHOS 47 05/01/2024 0855   BILITOT 0.9 05/01/2024 0855   BILITOT 0.9 02/06/2024 1614   GFRNONAA >60 05/04/2024 0431   GFRAA >60 01/28/2020 1946   Lipase     Component Value Date/Time   LIPASE 51 04/28/2024 1800       Studies/Results: No results found.  Anti-infectives: Anti-infectives (From admission,  onward)    Start     Dose/Rate Route Frequency Ordered Stop   04/29/24 0215  piperacillin -tazobactam (ZOSYN ) IVPB 3.375 g        3.375 g 12.5 mL/hr over 240 Minutes Intravenous Every 8 hours 04/29/24 0122 05/04/24 0147   04/29/24 0115  fluconazole  (DIFLUCAN ) IVPB 400 mg  Status:  Discontinued        400 mg 100 mL/hr over 120 Minutes Intravenous Every 24 hours 04/29/24 0111 05/06/24 0836   04/28/24 2000  piperacillin -tazobactam (ZOSYN ) IVPB 3.375 g        3.375 g 100 mL/hr over 30 Minutes Intravenous  Once 04/28/24 1956 04/28/24 2117        Assessment/Plan POD8: S/P Laparoscopy, diagnostic laparoscopic Arlyss patch repair placement of 19 French Blake drain by Dr. Rubin on 04/28/2024 due to free air of abdomen/perforated marginal ulcer at the gastrojejunostomy - Afebrile. Hemodynamically stable - Drain with SS fluid. DC JP today prior to DC home - UGI 05/02/2024 negative for leak. - tol. soft diet. - stop IV abx therapy - Continue PPI IV BID, will DC home on oral protonix  x 3 months - H. Pylori testing negative from 04/29/2024 - Pain controlled with oral pain medications. - may transition to Xarelto  - surgically stable for DC  home.  D/w pharmacy and primary service.     FEN: Soft; IVF per primary team VTE: SCDs, Xarelto  ID: Zosyn /diflucan , stopped 9/2    LOS: 8 days    Burnard FORBES Banter, [facility] 05/06/2024, 9:41 AM Please see Amion for pager number during day hours 7:00am-4:30pm

## 2024-05-06 NOTE — Progress Notes (Addendum)
 Jp drain removed as per orders.  Pt tolerated well  Site covered with gauze and tegaderm.   Heparin  stopped, Xarelto  given with food.

## 2024-05-06 NOTE — Discharge Summary (Signed)
 Physician Discharge Summary   Patient: Samuel Flynn MRN: [id number] DOB: 1976-01-07  Admit date:     04/28/2024  Discharge date: 05/06/24  Discharge Physician: Deliliah Room   PCP: [name], MD   Recommendations at discharge:    Follow up with your PCP in one week. Follow up with general surgery on the scheduled appointment. Continue taking meds as prescribed  Discharge Diagnoses: Principal Problem:   Pneumoperitoneum   Hospital Course:   48 y.o. 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, truck driver by occupation, medical history significant for previous Roux-en-Y bypass in 2023, paroxysmal A-fib/flutter, failed ablations x 2, plan for third ablation on 06/30/2024, now on Xarelto  since 04/07/24, chronic sinus bradycardia, HTN, OSA on CPAP, hypocalcemia, hospitalized 03/31/2024 - 04/01/2024 for complaints of chest pain, palpitations and admitted for NSTEMI, LHC showed mild nonobstructive CAD, medical therapy was recommended, echo showed LVEF of 60 to 65% with no wall motion abnormalities, since then has followed up with EP cardiology on 04/22/2024, presented to ED on 8/25 with complaints of diffuse abdominal pain. He was taken urgently to the OR and underwent laparoscopic, Arlyss patch repair of perforated marginal ulcer at the gastrojejunostomy on 8/25, started on dilaudid  PCA pump on 8/27 which was later discontinued. Upper GI study done on 8/29 was normal. Diet was advanced to a soft diet and patient was tolerating that. He was also on heparin  infusion which was dced on discharge and patient will resume taking his xarelto . He was having bowel movements and passing flatus. He will follow up with his PCP in one week and with general surgeon on 9/12. JP drain was removed prior to discharge.      Consultants: Surgery Procedures performed: Laparoscopy, diagnostic laparoscopic Arlyss patch repair   Disposition: Home Diet recommendation:  Cardiac diet DISCHARGE MEDICATION: Allergies as of  05/06/2024   No Known Allergies      Medication List     STOP taking these medications    doxycycline  100 MG tablet Commonly known as: VIBRA -TABS       TAKE these medications    methocarbamol  500 MG tablet Commonly known as: ROBAXIN  Take 1-2 tablets (500-1,000 mg total) by mouth every 8 (eight) hours as needed for muscle spasms.   oxyCODONE  5 MG immediate release tablet Commonly known as: Oxy IR/ROXICODONE  Take 1 tablet (5 mg total) by mouth every 4 (four) hours as needed (pain).   pantoprazole  40 MG tablet Commonly known as: PROTONIX  Take 1 tablet (40 mg total) by mouth 2 (two) times daily. What changed: when to take this   rivaroxaban  20 MG Tabs tablet Commonly known as: XARELTO  Take 1 tablet (20 mg total) by mouth daily with supper.   rosuvastatin  20 MG tablet Commonly known as: CRESTOR  Take 1 tablet (20 mg total) by mouth daily.   thiamine  100 MG tablet Commonly known as: Vitamin B-1 Take 1 tablet (100 mg total) by mouth daily. Start taking on: May 07, 2024        Follow-up Information     Rubin Calamity, MD Follow up on 05/16/2024.   Specialty: General Surgery Why: At 9:00AM. Please arrive 30 minutes prior to your appointment time. Please bring ID and insurance cards Contact information: [address] [place] [address] [phone]         [name], MD. Schedule an appointment as soon as possible for a visit in 1 week(s).   Specialty: Family Medicine Contact information: [address] [place] [address] [phone]  Discharge Exam: Filed Weights   04/28/24 1934  Weight: 85.3 kg   General exam: Alert and awake, no distress noted Respiratory system: Clear to auscultation. Respiratory effort normal. Cardiovascular system: S1 & S2 heard, RRR. No JVD, murmurs, rubs, gallops or clicks. No pedal edema. Gastrointestinal system: Incision looks clean, normoactive bowel  sounds Central nervous system: Alert and oriented. No focal neurological deficits. Extremities: Symmetric 5 x 5 power. Skin: No rashes, lesions or ulcers Psychiatry: Judgement and insight appear normal. Mood & affect appropriate.  Condition at discharge: good  The results of significant diagnostics from this hospitalization (including imaging, microbiology, ancillary and laboratory) are listed below for reference.   Imaging Studies: DG UGI W SINGLE CM (SOL OR THIN BA) Result Date: 05/02/2024 CLINICAL DATA:  History of Roux-en-Y bypass 2023. CT scan with pneumoperitoneum. Found to have perforated ulcer at the gastrojejunostomy. Now status post Arlyss patch repair at the gastrojejunostomy perforation site. Radiology consulted for or soluble upper GI study to evaluate for leak. EXAM: WATER SOLUBLE UPPER GI SERIES TECHNIQUE: Single-column upper GI series was performed using water soluble contrast. Radiation Exposure Index (as provided by the fluoroscopic device): 17.80 mGy Kerma CONTRAST:  100 mL Omnipaque  300 COMPARISON:  None Available. FLUOROSCOPY: Radiation Exposure Index (if provided by the fluoroscopic device): 17.80 mGy Number of Acquired Spot Images: 4 FINDINGS: Anatomic changes status post prior Roux-en-Y noted. Water-soluble contrast traveled intraluminally through the stomach into the jejunum with no evidence of contrast leak from gastrojejunostomy anastomosis site. The jejunum is noted to be dilated with evidence of submucosal edema which could represent ileus. IMPRESSION: No evidence of leak from the gastrojejunostomy anastomosis site. Dilated jejunum with submucosal edema distal to the anastomosis likely reflects postoperative ileus. Electronically Signed   By: Wilkie Lent M.D.   On: 05/02/2024 14:50   CT Angio Chest/Abd/Pel for Dissection W and/or Wo Contrast Result Date: 04/28/2024 CLINICAL DATA:  Acute aortic syndrome (AAS) suspected abdominal pain all over ,no vomiting, no diarrhea  - no recent abdominal surgery EXAM: CT ANGIOGRAPHY CHEST, ABDOMEN AND PELVIS TECHNIQUE: Non-contrast CT of the chest was initially obtained. Multidetector CT imaging through the chest, abdomen and pelvis was performed using the standard protocol during bolus administration of intravenous contrast. Multiplanar reconstructed images and MIPs were obtained and reviewed to evaluate the vascular anatomy. RADIATION DOSE REDUCTION: This exam was performed according to the departmental dose-optimization program which includes automated exposure control, adjustment of the mA and/or kV according to patient size and/or use of iterative reconstruction technique. CONTRAST:  OMNIPAQUE  IOHEXOL  350 MG/ML SOLN COMPARISON:  CT abdomen pelvis 07/14/2020 FINDINGS: CTA CHEST FINDINGS Cardiovascular: Preferential opacification of the thoracic aorta. No evidence of thoracic aortic aneurysm or dissection. Normal heart size. No significant pericardial effusion. The thoracic aorta is normal in caliber. No atherosclerotic plaque of the thoracic aorta. No coronary artery calcifications. Mediastinum/Nodes: No enlarged mediastinal, hilar, or axillary lymph nodes. Thyroid  gland, trachea, and esophagus demonstrate no significant findings. Lungs/Pleura: Bibasilar atelectasis. No focal consolidation. No pulmonary nodule. No pulmonary mass. No pleural effusion. No pneumothorax. Musculoskeletal: No chest wall abnormality. No suspicious lytic or blastic osseous lesions. No acute displaced fracture. Review of the MIP images confirms the above findings. CTA ABDOMEN AND PELVIS FINDINGS VASCULAR Aorta: Mild atherosclerotic plaque. Normal caliber aorta without aneurysm, dissection, vasculitis or significant stenosis. Celiac: Patent without evidence of aneurysm, dissection, vasculitis or significant stenosis. SMA: Patent without evidence of aneurysm, dissection, vasculitis or significant stenosis. Renals: Both renal arteries are patent without evidence  of aneurysm, dissection, vasculitis, fibromuscular dysplasia or significant stenosis. IMA: Patent without evidence of aneurysm, dissection, vasculitis or significant stenosis. Inflow: Mild atherosclerotic plaque. Patent without evidence of aneurysm, dissection, vasculitis or significant stenosis. Veins: No obvious venous abnormality within the limitations of this arterial phase study. Review of the MIP images confirms the above findings. NON-VASCULAR Hepatobiliary: No focal liver abnormality. Layering hyperdensity within the gallbladder lumen may represent cholelithiasis versus gallbladder sludge. No gallbladder wall thickening or pericholecystic fluid. No biliary dilatation. Pancreas: No focal lesion. Normal pancreatic contour. No surrounding inflammatory changes. No main pancreatic ductal dilatation. Spleen: Normal in size without focal abnormality. Adrenals/Urinary Tract: No adrenal nodule bilaterally. Bilateral kidneys enhance symmetrically. No hydronephrosis. No hydroureter. The urinary bladder is unremarkable. Stomach/Bowel: Roux-en-Y gastric bypass surgical changes. Stomach is within normal limits. Long segment jejunum biliopancreatic limb as well as common channel demonstrating circumferential marked bowel wall thickening (5:215). No pneumatosis. No small bowel dilatation. No definite swirling of the mesentery. No mesenteric edema. No evidence of large bowel wall thickening or dilatation. Appendix appears normal. Lymphatic: No lymphadenopathy. Reproductive: Prostate is enlarged measuring up to 5.2 cm. Other: Trace to small volume free intraperitoneal gas. Small volume free fluid. Layering hyperdensity within the pelvis adjacent to the rectum and prostate of unclear etiology (5:295). No organized fluid collection. Musculoskeletal: No abdominal wall hernia or abnormality. No suspicious lytic or blastic osseous lesions. No acute displaced fracture. 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 Review of the MIP images confirms the above findings. IMPRESSION: 1. No acute thoracic or abdominal aorta abnormality. 2. 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 3. Trace to small volume pneumoperitoneum and small volume free fluid with layering hyperdensity within the pelvis suggestive of extravasated PO contrast of unclear etiology. Concern for bowel perforation with exact origin unclear. If patient is stable, recommend repeat CT abdomen pelvis with use of PO contrast. 4. Cholelithiasis versus gallbladder sludge with no CT evidence of acute cholecystitis. 5. Prostatomegaly. These results were called by telephone at the time of interpretation on 04/28/2024 at 7:46 pm to provider JOSHUA LONG , who verbally acknowledged these results. Electronically Signed   By: Morgane  Naveau M.D.   On: 04/28/2024 19:48    Microbiology: Results for orders placed or performed during the hospital encounter of 08/02/21  Resp Panel by RT-PCR (Flu A&B, Covid) Nasopharyngeal Swab     Status: None   Collection Time: 08/02/21  5:14 PM   Specimen: Nasopharyngeal Swab; Nasopharyngeal(NP) swabs in vial transport medium  Result Value Ref Range Status   SARS Coronavirus 2 by RT PCR NEGATIVE NEGATIVE Final    Comment: (NOTE) SARS-CoV-2 target nucleic acids are NOT DETECTED.  The SARS-CoV-2 RNA is generally detectable in upper respiratory specimens during the acute phase of infection. The lowest concentration of SARS-CoV-2 viral copies this assay can detect is 138 copies/mL. A negative result does not preclude SARS-Cov-2 infection and should not be used as the sole basis for treatment or other patient management decisions. A negative result may occur with  improper specimen collection/handling, submission of specimen other than nasopharyngeal swab, presence of  viral mutation(s) within the areas targeted by this assay, and inadequate number of viral copies(<138 copies/mL). A negative result must be combined with clinical observations, patient history, and epidemiological information. The expected result is Negative.  Fact Sheet for Patients:  BloggerCourse.com  Fact Sheet for  Healthcare Providers:  SeriousBroker.it  This test is no t yet approved or cleared by the [place]  FDA and  has been authorized for detection and/or diagnosis of SARS-CoV-2 by FDA under an Emergency Use Authorization (EUA). This EUA will remain  in effect (meaning this test can be used) for the duration of the COVID-19 declaration under Section 564(b)(1) of the Act, 21 U.S.C.section 360bbb-3(b)(1), unless the authorization is terminated  or revoked sooner.       Influenza A by PCR NEGATIVE NEGATIVE Final   Influenza B by PCR NEGATIVE NEGATIVE Final    Comment: (NOTE) The Xpert Xpress SARS-CoV-2/FLU/RSV plus assay is intended as an aid in the diagnosis of influenza from Nasopharyngeal swab specimens and should not be used as a sole basis for treatment. Nasal washings and aspirates are unacceptable for Xpert Xpress SARS-CoV-2/FLU/RSV testing.  Fact Sheet for Patients: BloggerCourse.com  Fact Sheet for Healthcare Providers: SeriousBroker.it  This test is not yet approved or cleared by the [place]  FDA and has been authorized for detection and/or diagnosis of SARS-CoV-2 by FDA under an Emergency Use Authorization (EUA). This EUA will remain in effect (meaning this test can be used) for the duration of the COVID-19 declaration under Section 564(b)(1) of the Act, 21 U.S.C. section 360bbb-3(b)(1), unless the authorization is terminated or revoked.  Performed at [facility], [address] [place], [place] [address]     Labs: CBC: Recent Labs   Lab 05/02/24 [phone] 05/03/24 [phone] 05/04/24 0431 05/05/24 0707 05/06/24 0632  WBC 6.0 5.6 5.3 5.5 5.2  HGB 10.9* 11.2* 11.3* 11.6* 11.3*  HCT 33.0* 33.5* 34.2* 35.2* 34.0*  MCV 88.5 87.9 87.5 88.0 87.4  PLT 184 189 193 205 205   Basic Metabolic Panel: Recent Labs  Lab 04/30/24 0426 04/30/24 1428 04/30/24 2337 05/01/24 0855 05/02/24 0344 05/03/24 0613 05/04/24 0431  NA 142   < > 139 140 138 138 142  K 4.9   < > 5.8* 4.4 4.5 3.8 3.7  CL 103   < > 102 103 103 104 109  CO2 27   < > 28 29 28 25 24   GLUCOSE 100*   < > 89 81 82 86 87  BUN 30*   < > 28* 23* 15 9 5*  CREATININE 1.72*   < > 1.43* 1.35* 1.20 1.07 1.17  CALCIUM  7.7*   < > 8.1* 7.9* 8.0* 7.8* 8.2*  MG 2.0  --   --   --  2.0  --   --   PHOS 7.6*  --   --   --   --   --   --    < > = values in this interval not displayed.   Liver Function Tests: Recent Labs  Lab 05/01/24 0855  AST 51*  ALT 37  ALKPHOS 47  BILITOT 0.9  PROT 5.4*  ALBUMIN 2.3*   CBG: No results for input(s): GLUCAP in the last 168 hours.  Discharge time spent: 42 minutes.  Signed: Deliliah Room, MD [facility] 05/06/2024

## 2024-05-07 ENCOUNTER — Telehealth: Payer: Self-pay

## 2024-05-07 NOTE — Transitions of Care (Post Inpatient/ED Visit) (Signed)
   05/07/2024  Name: JABRIL PURSELL MRN: [id number] DOB: [birth year]  Today's TOC FU Call Status: Today's TOC FU Call Status:: Unsuccessful Call (1st Attempt) Unsuccessful Call (1st Attempt) Date: 05/07/24  Attempted to reach the patient regarding the most recent Inpatient/ED visit.  Follow Up Plan: Additional outreach attempts will be made to reach the patient to complete the Transitions of Care (Post Inpatient/ED visit) call.   Signature  Slater Diesel, RN

## 2024-05-08 ENCOUNTER — Telehealth: Payer: Self-pay

## 2024-05-08 NOTE — Transitions of Care (Post Inpatient/ED Visit) (Signed)
   05/08/2024  Name: Samuel Flynn MRN: [id number] DOB: 01/11/1976  Today's TOC FU Call Status: Today's TOC FU Call Status:: Successful TOC FU Call Completed Unsuccessful Call (1st Attempt) Date: 05/07/24 [facility] FU Call Complete Date: 05/08/24 Patient's Name and Date of Birth confirmed.  Transition Care Management Follow-up Telephone Call Date of Discharge: 05/06/24 Discharge Facility: [facility]) Type of Discharge: Inpatient Admission Primary Inpatient Discharge Diagnosis:: pneumoperitoneum How have you been since you were released from the Flynn?: Better (He stated he is sore, but alright) Any questions or concerns?: No  Items Reviewed: Did you receive and understand the discharge instructions provided?: Yes Medications obtained,verified, and reconciled?: Yes (Medications Reviewed) (He has all medications and did not have any questions about the med regime) Any new allergies since your discharge?: No Dietary orders reviewed?: Yes Type of Diet Ordered:: heart healthy, low sodium Do you have support at home?: Yes People in Home [RPT]: spouse Name of Support/Comfort Primary Source: his wife  Medications Reviewed Today: Medications Reviewed Today     Reviewed by Samuel Bradley, RN (Case Manager) on 05/08/24 at 1137  Med List Status: <None>   Medication Order Taking? Sig Documenting Provider Last Dose Status Informant  methocarbamol  (ROBAXIN ) 500 MG tablet [id number]  Take 1-2 tablets (500-1,000 mg total) by mouth every 8 (eight) hours as needed for muscle spasms. Samuel Sor, PA-C  Active   oxyCODONE  (OXY IR/ROXICODONE ) 5 MG immediate release tablet [id number]  Take 1 tablet (5 mg total) by mouth every 4 (four) hours as needed (pain). Samuel Sor, PA-C  Active   pantoprazole  (PROTONIX ) 40 MG tablet [id number]  Take 1 tablet (40 mg total) by mouth 2 (two) times daily. Samuel Sor, PA-C  Active   rivaroxaban  (XARELTO ) 20 MG TABS tablet [id number] No Take 1 tablet (20 mg  total) by mouth daily with supper. Samuel Charlies Helling, PA-C 04/28/2024 Active Self, Pharmacy Records, Spouse/Significant Other  rosuvastatin  (CRESTOR ) 20 MG tablet [id number] No Take 1 tablet (20 mg total) by mouth daily.  Patient not taking: Reported on 04/28/2024   Samuel Leontine SAILOR, PA-C Not Taking Active Self, Pharmacy Records, Spouse/Significant Other  thiamine  (VITAMIN B-1) 100 MG tablet [id number]  Take 1 tablet (100 mg total) by mouth daily. Dino Antu, MD  Active             Home Care and Equipment/Supplies: Were Home Health Services Ordered?: No Any new equipment or medical supplies ordered?: No  Functional Questionnaire: Do you need assistance with bathing/showering or dressing?: No Do you need assistance with meal preparation?: Yes (His wife has been making his meals) Do you need assistance with eating?: No Do you have difficulty maintaining continence: No Do you need assistance with getting out of bed/getting out of a chair/moving?: No Do you have difficulty managing or taking your medications?: No  Follow up appointments reviewed: PCP Follow-up appointment confirmed?: Yes Date of PCP follow-up appointment?: 05/20/24 Follow-up Provider: Dr Tanda [facility] Follow-up appointment confirmed?: Yes Date of Specialist follow-up appointment?: 05/16/24 Follow-Up Specialty Provider:: surgeon Do you need transportation to your follow-up appointment?: No Do you understand care options if your condition(s) worsen?: Yes-patient verbalized understanding    SIGNATURE Flynn Marvis, RN

## 2024-05-20 ENCOUNTER — Inpatient Hospital Stay: Admitting: Family Medicine

## 2024-05-27 ENCOUNTER — Telehealth: Payer: Self-pay | Admitting: Family Medicine

## 2024-05-27 NOTE — Telephone Encounter (Signed)
 A document form from [employer] has been faxed: Disability paperwork, to be filled out by provider. Send document back via Fax on day of completion. Document is located in providers tray at front office.          Fax number: [phone]   Pt scheduled for paperwork on 10/07

## 2024-05-28 NOTE — Telephone Encounter (Signed)
 Noted and placed in my hold folder for his appt on 10/7

## 2024-05-29 ENCOUNTER — Ambulatory Visit

## 2024-06-02 ENCOUNTER — Ambulatory Visit: Admitting: Cardiology

## 2024-06-10 ENCOUNTER — Ambulatory Visit (INDEPENDENT_AMBULATORY_CARE_PROVIDER_SITE_OTHER): Admitting: Family Medicine

## 2024-06-10 ENCOUNTER — Ambulatory Visit (HOSPITAL_COMMUNITY)
Admission: RE | Admit: 2024-06-10 | Discharge: 2024-06-10 | Disposition: A | Discharge status: Home / Self Care | Source: Ambulatory Visit | Attending: Cardiovascular Disease | Admitting: Cardiovascular Disease

## 2024-06-10 ENCOUNTER — Telehealth (HOSPITAL_COMMUNITY): Payer: Self-pay

## 2024-06-10 ENCOUNTER — Encounter: Payer: Self-pay | Admitting: Family Medicine

## 2024-06-10 ENCOUNTER — Encounter (HOSPITAL_COMMUNITY): Payer: Self-pay

## 2024-06-10 VITALS — BP 137/88 | HR 52 | Temp 98.1°F | Ht 74.0 in | Wt 98.2 lb

## 2024-06-10 DIAGNOSIS — I252 Old myocardial infarction: Secondary | ICD-10-CM | POA: Diagnosis not present

## 2024-06-10 DIAGNOSIS — R10819 Abdominal tenderness, unspecified site: Secondary | ICD-10-CM | POA: Diagnosis not present

## 2024-06-10 DIAGNOSIS — I4891 Unspecified atrial fibrillation: Secondary | ICD-10-CM | POA: Diagnosis not present

## 2024-06-10 DIAGNOSIS — I480 Paroxysmal atrial fibrillation: Secondary | ICD-10-CM | POA: Diagnosis present

## 2024-06-10 DIAGNOSIS — Z0289 Encounter for other administrative examinations: Secondary | ICD-10-CM

## 2024-06-10 LAB — CBC

## 2024-06-10 MED ORDER — IOHEXOL 350 MG/ML SOLN
80.0000 mL | Freq: Once | INTRAVENOUS | Status: AC | PRN
  Administered 2024-06-10: 80 mL via INTRAVENOUS

## 2024-06-10 NOTE — Telephone Encounter (Signed)
 Spoke with patient to complete pre-procedure call.     Health status review:  Any new medical conditions, hospitalizations or surgeries, recent signs of acute illness or been started on antibiotics? Pt had a laparoscopic, graham patch repair of perforated marginal ulcer at the gastrojejunostomy on 04/28/24.           Any new medications started since pre-op visit? No  Follow all medication instructions prior to procedure or the procedure may be rescheduled:    Continue taking Xarelto  (Rivaroxaban ) once daily without missing any doses before procedure. Essential chronic medications:  No medication should be continued, unless told otherwise. On the morning of your procedure DO NOT take any medication., including Xarelto  (Rivaroxaban ).  Nothing to eat or drink after midnight prior to your procedure.  Pre-procedure testing scheduled: CT and lab completed on October 7.  Confirmed patient is scheduled for Atrial Fibrillation Ablation on Tuesday, October 28 with Dr. Soyla Norton. Instructed patient to arrive at the Main Entrance A at [facility]: [address] [place], [place] [address] and check in at Admitting at 8:00 AM.  Advised of plan to go home the same day and will only stay overnight if medically necessary. You MUST have a responsible adult to drive you home and MUST be with you the first 24 hours after you arrive home or your procedure could be cancelled.  Informed patient a nurse will call a day before the procedure to confirm arrival time and ensure instructions are followed.  Patient verbalized understanding to information provided and is agreeable to proceed with procedure.   Advised patient to contact RN Navigator at [phone], to inform of any new medications started after call or concerns prior to procedure.

## 2024-06-10 NOTE — Progress Notes (Signed)
 Established Patient Office Visit  Subjective    Patient ID: Samuel Flynn, male    DOB: 10-Jul-1976  Age: 48 y.o. MRN: [id number]  CC:  Chief Complaint  Patient presents with   Medical Management of Chronic Issues    HPI QUINTON VOTH presents for completion of FMLA and disability paperwork for his employer. He has been out of work since July when he had NSTEMI. He also had an episode of pneumoperitoneum since then. He has a surgical procedure for an ablation scheduled for the end of the month.  Outpatient Encounter Medications as of 06/10/2024  Medication Sig   methocarbamol  (ROBAXIN ) 500 MG tablet Take 1-2 tablets (500-1,000 mg total) by mouth every 8 (eight) hours as needed for muscle spasms.   oxyCODONE  (OXY IR/ROXICODONE ) 5 MG immediate release tablet Take 1 tablet (5 mg total) by mouth every 4 (four) hours as needed (pain).   pantoprazole  (PROTONIX ) 40 MG tablet Take 1 tablet (40 mg total) by mouth 2 (two) times daily.   rivaroxaban  (XARELTO ) 20 MG TABS tablet Take 1 tablet (20 mg total) by mouth daily with supper.   rosuvastatin  (CRESTOR ) 20 MG tablet Take 1 tablet (20 mg total) by mouth daily. (Patient not taking: Reported on 04/28/2024)   thiamine  (VITAMIN B-1) 100 MG tablet Take 1 tablet (100 mg total) by mouth daily.   No facility-administered encounter medications on file as of 06/10/2024.    Past Medical History:  Diagnosis Date   Atrial fibrillation with RVR (HCC)    Gastric ulcer    Gout    Hypertension    Paroxysmal A-fib (HCC)    Sleep apnea    USES CPAP   Wears glasses     Past Surgical History:  Procedure Laterality Date   ATRIAL FIBRILLATION ABLATION N/A 04/26/2018   Procedure: ATRIAL FIBRILLATION ABLATION;  Surgeon: Inocencio Soyla Lunger, MD;  Location: [facility] INVASIVE CV LAB;  Service: Cardiovascular;  Laterality: N/A;   ATRIAL FIBRILLATION ABLATION N/A 11/07/2019   Procedure: ATRIAL FIBRILLATION ABLATION;  Surgeon: Inocencio Soyla Lunger, MD;   Location: [facility] INVASIVE CV LAB;  Service: Cardiovascular;  Laterality: N/A;   CARDIOVERSION N/A 01/18/2018   Procedure: CARDIOVERSION;  Surgeon: Rolan Ezra RAMAN, MD;  Location: [facility] ENDOSCOPY;  Service: Cardiovascular;  Laterality: N/A;   CARDIOVERSION N/A 08/19/2019   Procedure: CARDIOVERSION;  Surgeon: Alveta Aleene PARAS, MD;  Location: [facility] ENDOSCOPY;  Service: Cardiovascular;  Laterality: N/A;   CARDIOVERSION N/A 04/18/2021   Procedure: CARDIOVERSION;  Surgeon: Alveta Aleene PARAS, MD;  Location: [facility] ENDOSCOPY;  Service: Cardiovascular;  Laterality: N/A;   ELBOW LIGAMENT RECONSTRUCTION Right 09/29/2014   Procedure: HECTOR FRACTURE FIXATION, POSSIBLE LIGAMENT REPAIR;  Surgeon: Eva Elsie Herring, MD;  Location: [facility];  Service: Orthopedics;  Laterality: Right;  Right elbow coranoid fracture fixation, possible ligament repair   HIATAL HERNIA REPAIR  04/28/2024   Procedure: LAPAROSCOPIC GASTRIC PARAS SPALDING;  Surgeon: Rubin Calamity, MD;  Location: [facility] OR;  Service: General;;   LAPAROSCOPY N/A 04/28/2024   Procedure: LAPAROSCOPY, DIAGNOSTIC;  Surgeon: Rubin Calamity, MD;  Location: [facility] OR;  Service: General;  Laterality: N/A;   LEFT HEART CATH AND CORONARY ANGIOGRAPHY N/A 03/31/2024   Procedure: LEFT HEART CATH AND CORONARY ANGIOGRAPHY;  Surgeon: Swaziland, Peter M, MD;  Location: [facility] INVASIVE CV LAB;  Service: Cardiovascular;  Laterality: N/A;   TEE WITHOUT CARDIOVERSION N/A 01/18/2018   Procedure: TRANSESOPHAGEAL ECHOCARDIOGRAM (TEE);  Surgeon: Rolan Ezra RAMAN, MD;  Location: [facility] ENDOSCOPY;  Service: Cardiovascular;  Laterality: N/A;   TEE WITHOUT CARDIOVERSION N/A 04/18/2021   Procedure: TRANSESOPHAGEAL ECHOCARDIOGRAM (TEE);  Surgeon: Alveta, Aleene PARAS, MD;  Location: [facility] ENDOSCOPY;  Service: Cardiovascular;  Laterality: N/A;   WISDOM TOOTH EXTRACTION      Family History  Problem Relation Age of Onset   Hypertension Paternal Uncle    Hypertension Maternal Grandmother    Hypertension  Paternal Uncle    Hypertension Paternal Uncle    Hypertension Paternal Uncle    Hypertension Paternal Uncle    Hypertension Mother    Stroke Father    Heart disease Father    Heart attack Father    Hypertension Father    Colon cancer Neg Hx     Social History   Socioeconomic History   Marital status: Married    Spouse name: Not on file   Number of children: 4   Years of education: 10   Highest education level: 7th grade  Occupational History   Occupation: Truck Air traffic controller: [employer]  Tobacco Use   Smoking status: Former    Types: Cigars    Quit date: 07/20/2021    Years since quitting: 2.8   Smokeless tobacco: Never   Tobacco comments:    Former smoker 08/16/21  Vaping Use   Vaping status: Never Used  Substance and Sexual Activity   Alcohol use: Not Currently    Comment: occ   Drug use: No   Sexual activity: Yes    Comment: 5 a day  Other Topics Concern   Not on file  Social History Narrative   Not on file   Social Drivers of Health   Financial Resource Strain: Medium Risk (05/25/2024)   Overall Financial Resource Strain (CARDIA)    Difficulty of Paying Living Expenses: Somewhat hard  Food Insecurity: Food Insecurity Present (05/25/2024)   Hunger Vital Sign    Worried About Running Out of Food in the Last Year: Sometimes true    Ran Out of Food in the Last Year: Sometimes true  Transportation Needs: No Transportation Needs (05/25/2024)   PRAPARE - Administrator, Civil Service (Medical): No    Lack of Transportation (Non-Medical): No  Physical Activity: Sufficiently Active (05/25/2024)   Exercise Vital Sign    Days of Exercise per Week: 4 days    Minutes of Exercise per Session: 40 min  Stress: No Stress Concern Present (05/25/2024)   [employer] of Occupational Health - Occupational Stress Questionnaire    Feeling of Stress: Only a little  Social Connections: Moderately Isolated (05/25/2024)   Social Connection and  Isolation Panel    Frequency of Communication with Friends and Family: More than three times a week    Frequency of Social Gatherings with Friends and Family: More than three times a week    Attends Religious Services: Never    Database administrator or Organizations: No    Attends Engineer, structural: Not on file    Marital Status: Married  Catering manager Violence: Not At Risk (04/29/2024)   Humiliation, Afraid, Rape, and Kick questionnaire    Fear of Current or Ex-Partner: No    Emotionally Abused: No    Physically Abused: No    Sexually Abused: No    Review of Systems  Constitutional:  Negative for chills and fever.  Gastrointestinal:  Positive for abdominal pain.  All other systems reviewed and are negative.       Objective    BP 137/88  Pulse (!) 52   Temp 98.1 F (36.7 C) (Oral)   Ht 6' 2 (1.88 m)   Wt 98 lb 3.2 oz (44.5 kg)   SpO2 96%   BMI 12.61 kg/m   Physical Exam Vitals and nursing note reviewed.  Constitutional:      General: He is not in acute distress. Cardiovascular:     Rate and Rhythm: Normal rate and regular rhythm.  Pulmonary:     Effort: Pulmonary effort is normal.     Breath sounds: Normal breath sounds.  Abdominal:     Palpations: Abdomen is soft.     Tenderness: There is abdominal tenderness.  Neurological:     General: No focal deficit present.     Mental Status: He is alert and oriented to person, place, and time.         Assessment & Plan:   History of non-ST elevation myocardial infarction (NSTEMI)  Atrial fibrillation, unspecified type (HCC)  Abdominal tenderness without rebound tenderness, unspecified location  Encounter for completion of form with patient     No follow-ups on file.   Tanda Raguel SQUIBB, MD

## 2024-06-11 LAB — CBC
Hematocrit: 39.8 % (ref 37.5–51.0)
Hemoglobin: 13.1 g/dL (ref 13.0–17.7)
MCH: 29.1 pg (ref 26.6–33.0)
MCHC: 32.9 g/dL (ref 31.5–35.7)
MCV: 88 fL (ref 79–97)
Platelets: 236 x10E3/uL (ref 150–450)
RBC: 4.5 x10E6/uL (ref 4.14–5.80)
RDW: 16.5 % — AB (ref 11.6–15.4)
WBC: 4.3 x10E3/uL (ref 3.4–10.8)

## 2024-06-11 LAB — BASIC METABOLIC PANEL WITH GFR
BUN/Creatinine Ratio: 7 — AB (ref 9–20)
BUN: 7 mg/dL (ref 6–24)
CO2: 20 mmol/L (ref 20–29)
Calcium: 8.4 mg/dL — AB (ref 8.7–10.2)
Chloride: 106 mmol/L (ref 96–106)
Creatinine, Ser: 1.02 mg/dL (ref 0.76–1.27)
Glucose: 70 mg/dL (ref 70–99)
Potassium: 4.3 mmol/L (ref 3.5–5.2)
Sodium: 143 mmol/L (ref 134–144)
eGFR: 90 mL/min/1.73 (ref >59)

## 2024-06-16 ENCOUNTER — Telehealth: Payer: Self-pay | Admitting: Family Medicine

## 2024-06-16 ENCOUNTER — Telehealth: Payer: Self-pay

## 2024-06-16 NOTE — Telephone Encounter (Signed)
 A document form has been faxed: Medical Authorization Form, to be filled out by provider. Send document back via Fax within 7-days to 14 days. Document is located in providers tray at front office.          Fax number: [phone]

## 2024-06-16 NOTE — Telephone Encounter (Signed)
 Copied from CRM [id number]. Topic: General - Other >> Jun 16, 2024  4:07 PM Wess RAMAN wrote: Reason for CRM: Patient would like to speak with Dr. Luigi nurse about a ICD code for his insurance company.  Callback #: [id number]

## 2024-06-16 NOTE — Telephone Encounter (Signed)
 Received and placed in hold folder. Dr. Tanda is going to advise when to schedule him for OV

## 2024-06-26 ENCOUNTER — Ambulatory Visit

## 2024-06-30 NOTE — Pre-Procedure Instructions (Signed)
 Instructed patient on the following items: Arrival time 0730 Nothing to eat or drink after midnight No meds AM of procedure Responsible person to drive you home and stay with you for 24 hrs  Have you missed any doses of anti-coagulant Xarelto - takes once a day, hasn't missed any doses.

## 2024-07-01 ENCOUNTER — Ambulatory Visit (HOSPITAL_COMMUNITY)
Admission: RE | Admit: 2024-07-01 | Discharge: 2024-07-01 | Disposition: A | Discharge status: Home / Self Care | Attending: Cardiology | Admitting: Cardiology

## 2024-07-01 ENCOUNTER — Ambulatory Visit (HOSPITAL_COMMUNITY): Admitting: Anesthesiology

## 2024-07-01 ENCOUNTER — Encounter (HOSPITAL_COMMUNITY): Payer: Self-pay | Admitting: Cardiology

## 2024-07-01 ENCOUNTER — Other Ambulatory Visit: Payer: Self-pay

## 2024-07-01 ENCOUNTER — Ambulatory Visit (HOSPITAL_COMMUNITY)
Admission: RE | Disposition: A | Discharge status: Home / Self Care | Payer: Self-pay | Source: Home / Self Care | Attending: Cardiology

## 2024-07-01 DIAGNOSIS — I4892 Unspecified atrial flutter: Secondary | ICD-10-CM | POA: Diagnosis not present

## 2024-07-01 DIAGNOSIS — I10 Essential (primary) hypertension: Secondary | ICD-10-CM | POA: Diagnosis not present

## 2024-07-01 DIAGNOSIS — Z6841 Body Mass Index (BMI) 40.0 and over, adult: Secondary | ICD-10-CM | POA: Diagnosis not present

## 2024-07-01 DIAGNOSIS — I480 Paroxysmal atrial fibrillation: Secondary | ICD-10-CM | POA: Diagnosis not present

## 2024-07-01 DIAGNOSIS — Z7901 Long term (current) use of anticoagulants: Secondary | ICD-10-CM | POA: Insufficient documentation

## 2024-07-01 DIAGNOSIS — G4730 Sleep apnea, unspecified: Secondary | ICD-10-CM | POA: Insufficient documentation

## 2024-07-01 DIAGNOSIS — Z87891 Personal history of nicotine dependence: Secondary | ICD-10-CM

## 2024-07-01 DIAGNOSIS — E669 Obesity, unspecified: Secondary | ICD-10-CM | POA: Diagnosis not present

## 2024-07-01 DIAGNOSIS — Z79899 Other long term (current) drug therapy: Secondary | ICD-10-CM | POA: Diagnosis not present

## 2024-07-01 DIAGNOSIS — I4891 Unspecified atrial fibrillation: Secondary | ICD-10-CM | POA: Diagnosis not present

## 2024-07-01 HISTORY — PX: ATRIAL FIBRILLATION ABLATION: EP1191

## 2024-07-01 LAB — POCT ACTIVATED CLOTTING TIME
Activated Clotting Time: 274 s
Activated Clotting Time: 354 s
Activated Clotting Time: 377 s

## 2024-07-01 MED ORDER — HEPARIN SODIUM (PORCINE) 1000 UNIT/ML IJ SOLN
INTRAMUSCULAR | Status: DC | PRN
  Administered 2024-07-01: 1000 [IU] via INTRAVENOUS

## 2024-07-01 MED ORDER — HEPARIN (PORCINE) IN NACL 1000-0.9 UT/500ML-% IV SOLN
INTRAVENOUS | Status: DC | PRN
  Administered 2024-07-01 (×3): 500 mL

## 2024-07-01 MED ORDER — PROTAMINE SULFATE 10 MG/ML IV SOLN
INTRAVENOUS | Status: DC | PRN
  Administered 2024-07-01: 40 mg via INTRAVENOUS

## 2024-07-01 MED ORDER — ACETAMINOPHEN 325 MG PO TABS: 650.0000 mg | ORAL_TABLET | ORAL | Status: DC | PRN

## 2024-07-01 MED ORDER — MIDAZOLAM HCL 2 MG/2ML IJ SOLN
INTRAMUSCULAR | Status: AC
  Filled 2024-07-01: qty 2

## 2024-07-01 MED ORDER — FENTANYL CITRATE (PF) 100 MCG/2ML IJ SOLN
INTRAMUSCULAR | Status: AC
  Filled 2024-07-01: qty 2

## 2024-07-01 MED ORDER — ONDANSETRON HCL 4 MG/2ML IJ SOLN: 4.0000 mg | Freq: Four times a day (QID) | INTRAMUSCULAR | Status: DC | PRN

## 2024-07-01 MED ORDER — ROCURONIUM BROMIDE 10 MG/ML (PF) SYRINGE
PREFILLED_SYRINGE | INTRAVENOUS | Status: DC | PRN
  Administered 2024-07-01: 20 mg via INTRAVENOUS
  Administered 2024-07-01: 50 mg via INTRAVENOUS
  Administered 2024-07-01: 20 mg via INTRAVENOUS
  Administered 2024-07-01: 10 mg via INTRAVENOUS

## 2024-07-01 MED ORDER — LIDOCAINE 2% (20 MG/ML) 5 ML SYRINGE
INTRAMUSCULAR | Status: DC | PRN
  Administered 2024-07-01: 60 mg via INTRAVENOUS

## 2024-07-01 MED ORDER — HEPARIN SODIUM (PORCINE) 1000 UNIT/ML IJ SOLN
INTRAMUSCULAR | Status: AC
  Filled 2024-07-01: qty 10

## 2024-07-01 MED ORDER — SODIUM CHLORIDE 0.9% FLUSH
3.0000 mL | INTRAVENOUS | Status: DC | PRN
Start: 2024-07-01 — End: 2024-07-01

## 2024-07-01 MED ORDER — ATROPINE SULFATE 1 MG/ML IV SOLN
INTRAVENOUS | Status: DC | PRN
  Administered 2024-07-01: 1 mg via INTRAVENOUS

## 2024-07-01 MED ORDER — PROPOFOL 10 MG/ML IV BOLUS
INTRAVENOUS | Status: DC | PRN
Start: 2024-07-01 — End: 2024-07-01
  Administered 2024-07-01: 200 mg via INTRAVENOUS

## 2024-07-01 MED ORDER — HEPARIN SODIUM (PORCINE) 1000 UNIT/ML IJ SOLN
INTRAMUSCULAR | Status: DC | PRN
  Administered 2024-07-01: 14000 [IU] via INTRAVENOUS
  Administered 2024-07-01: 2000 [IU] via INTRAVENOUS
  Administered 2024-07-01: 8000 [IU] via INTRAVENOUS

## 2024-07-01 MED ORDER — ONDANSETRON HCL 4 MG/2ML IJ SOLN
INTRAMUSCULAR | Status: DC | PRN
  Administered 2024-07-01: 4 mg via INTRAVENOUS

## 2024-07-01 MED ORDER — SUGAMMADEX SODIUM 200 MG/2ML IV SOLN
INTRAVENOUS | Status: DC | PRN
  Administered 2024-07-01: 300 mg via INTRAVENOUS

## 2024-07-01 MED ORDER — MIDAZOLAM HCL (PF) 2 MG/2ML IJ SOLN
INTRAMUSCULAR | Status: DC | PRN
  Administered 2024-07-01: 2 mg via INTRAVENOUS

## 2024-07-01 MED ORDER — SODIUM CHLORIDE 0.9 % IV SOLN
250.0000 mL | INTRAVENOUS | Status: DC | PRN
Start: 2024-07-01 — End: 2024-07-01

## 2024-07-01 MED ORDER — SODIUM CHLORIDE 0.9 % IV SOLN: INTRAVENOUS | Status: DC

## 2024-07-01 MED ORDER — DEXAMETHASONE SOD PHOSPHATE PF 10 MG/ML IJ SOLN
INTRAMUSCULAR | Status: DC | PRN
  Administered 2024-07-01: 10 mg via INTRAVENOUS

## 2024-07-01 MED ORDER — HEPARIN SODIUM (PORCINE) 1000 UNIT/ML IJ SOLN
INTRAMUSCULAR | Status: AC
Start: 2024-07-01 — End: 2024-07-01
  Filled 2024-07-01: qty 10

## 2024-07-01 MED ORDER — FENTANYL CITRATE (PF) 250 MCG/5ML IJ SOLN
INTRAMUSCULAR | Status: DC | PRN
  Administered 2024-07-01: 100 ug via INTRAVENOUS

## 2024-07-01 SURGICAL SUPPLY — 17 items
CABLE VARIPULSE STERILE (CATHETERS) IMPLANT
CATH DECANAV F CURVE (CATHETERS) IMPLANT
CATH GE 8FR SOUNDSTAR (CATHETERS) IMPLANT
CATH OCTARAY 2.0 F 3-3-3-3-3 (CATHETERS) IMPLANT
CATHETER VARIPULSE 8.5FR (CATHETERS) IMPLANT
CLOSURE MYNX CONTROL 6F/7F (Vascular Products) IMPLANT
CLOSURE PERCLOSE PROSTYLE (Vascular Products) IMPLANT
COVER SWIFTLINK CONNECTOR (BAG) ×1 IMPLANT
PACK EP LF (CUSTOM PROCEDURE TRAY) ×1 IMPLANT
PAD DEFIB RADIO PHYSIO CONN (PAD) ×1 IMPLANT
PATCH CARTO3 (PAD) IMPLANT
SHEATH BAYLIS TRANSSEPTAL 98CM (NEEDLE) IMPLANT
SHEATH CARTO VIZIGO SM CVD (SHEATH) IMPLANT
SHEATH PINNACLE 8F 10CM (SHEATH) IMPLANT
SHEATH PINNACLE 9F 10CM (SHEATH) IMPLANT
SHEATH PROBE COVER 6X72 (BAG) IMPLANT
TUBING SMART ABLATE COOLFLOW (TUBING) IMPLANT

## 2024-07-01 NOTE — Progress Notes (Signed)
 Patient ambulated to BR , rt groin is unremarkable.

## 2024-07-01 NOTE — Anesthesia Postprocedure Evaluation (Signed)
 Anesthesia Post Note  Patient: Samuel Flynn  Procedure(s) Performed: ATRIAL FIBRILLATION ABLATION     Patient location during evaluation: Cath Lab Anesthesia Type: General Level of consciousness: awake Pain management: pain level controlled Vital Signs Assessment: post-procedure vital signs reviewed and stable Respiratory status: spontaneous breathing, nonlabored ventilation and respiratory function stable Cardiovascular status: blood pressure returned to baseline and stable Postop Assessment: no apparent nausea or vomiting Anesthetic complications: no   There were no known notable events for this encounter.  Last Vitals:  Vitals:   07/01/24 1400 07/01/24 1441  BP: 122/69 133/76  Pulse: (!) 45 (!) 50  Resp: 17 16  Temp:    SpO2: 99% 99%    Last Pain:  Vitals:   07/01/24 1221  TempSrc: Oral  PainSc:    Pain Goal:                   Bernardino SQUIBB Geza Beranek

## 2024-07-01 NOTE — H&P (Signed)
  Electrophysiology Office Note:   Date:  07/01/2024  ID:  Samuel Flynn, DOB 1976/05/15, MRN [id number]  Primary Cardiologist: Annabella Scarce, MD Primary Heart Failure: None Electrophysiologist: Sofi Bryars Gladis Norton, MD      History of Present Illness:   Samuel Flynn is a 48 y.o. male with h/o atrial fibrillation, hypertension, obesity seen today for routine electrophysiology followup.   He has had atrial fibrillation x 2.  He presented to the hospital 03/31/2024 with palpitations and chest discomfort.  He arrived to the hospital in sinus rhythm.  He was presumed to have rapid atrial fibrillation at the time of presentation.  Elevated troponin was thought due to rapid atrial fibrillation his catheterization showed no obstructive disease.  Today, denies symptoms of palpitations, chest pain, dyspnea, orthopnea, PND, lower extremity edema, claudication, dizziness, presyncope, syncope, bleeding, or neurologic sequela. The patient is tolerating medications without difficulties. Plan ablation today.   EP Information / Studies Reviewed:    EKG is not ordered today. EKG from 03/31/2024 reviewed which showed sinus rhythm      Risk Assessment/Calculations:    CHA2DS2-VASc Score = 1   This indicates a 0.6% annual risk of stroke. The patient's score is based upon: CHF History: 0 HTN History: 1 Diabetes History: 0 Stroke History: 0 Vascular Disease History: 0 Age Score: 0 Gender Score: 0             Physical Exam:   VS:  BP (!) 148/85   Pulse (!) 40   Temp 97.9 F (36.6 C) (Oral)   Resp 12   Ht 6' 2 (1.88 m)   Wt 77.1 kg   SpO2 100%   BMI 21.83 kg/m    Wt Readings from Last 3 Encounters:  07/01/24 77.1 kg  06/10/24 44.5 kg  04/28/24 85.3 kg    GEN: Well nourished, well developed in no acute distress NECK: No JVD; No carotid bruits CARDIAC: Regular rate and rhythm, no murmurs, rubs, gallops RESPIRATORY:  Clear to auscultation without rales, wheezing or rhonchi   ABDOMEN: Soft, non-tender, non-distended EXTREMITIES:  No edema; No deformity    ASSESSMENT AND PLAN:    1.  Paroxysmal atrial fibrillation/flutter: Samuel Flynn has presented today for surgery, with the diagnosis of AF.  The various methods of treatment have been discussed with the patient and family. After consideration of risks, benefits and other options for treatment, the patient has consented to  Procedure(s): Catheter ablation as a surgical intervention .  Risks include but not limited to complete heart block, stroke, esophageal damage, nerve damage, bleeding, vascular damage, tamponade, perforation, MI, and death. The patient's history has been reviewed, patient examined, no change in status, stable for surgery.  I have reviewed the patient's chart and labs.  Questions were answered to the patient's satisfaction.    Alayne Estrella Norton, MD 07/01/2024 9:08 AM

## 2024-07-01 NOTE — Discharge Instructions (Signed)

## 2024-07-01 NOTE — Transfer of Care (Signed)
 Immediate Anesthesia Transfer of Care Note  Patient: Samuel Flynn [facility]  Procedure(s) Performed: ATRIAL FIBRILLATION ABLATION  Patient Location: Cath Lab  Anesthesia Type:General  Level of Consciousness: awake, alert , and oriented  Airway & Oxygen  Therapy: Patient Spontanous Breathing and Patient connected to nasal cannula oxygen   Post-op Assessment: Report given to RN and Post -op Vital signs reviewed and stable  Post vital signs: Reviewed and stable  Last Vitals:  Vitals Value Taken Time  BP 141/90 07/01/24 12:00  Temp 36.9 C 07/01/24 11:49  Pulse 56 07/01/24 12:02  Resp 13 07/01/24 12:02  SpO2 98 % 07/01/24 12:02  Vitals shown include unfiled device data.  Last Pain:  Vitals:   07/01/24 1149  TempSrc: Oral  PainSc: 0-No pain         Complications: There were no known notable events for this encounter.

## 2024-07-01 NOTE — Anesthesia Preprocedure Evaluation (Addendum)
 Anesthesia Evaluation  Patient identified by MRN, date of birth, ID band Patient awake    Reviewed: Allergy & Precautions, NPO status , Patient's Chart, lab work & pertinent test results  Airway Mallampati: III  TM Distance: >3 FB Neck ROM: Full    Dental no notable dental hx.    Pulmonary sleep apnea and Continuous Positive Airway Pressure Ventilation , former smoker   Pulmonary exam normal        Cardiovascular Normal cardiovascular exam+ dysrhythmias Atrial Fibrillation   ECHO: 1. Left ventricular ejection fraction, by estimation, is 60 to 65%. The  left ventricle has normal function. The left ventricle has no regional  wall motion abnormalities. Left ventricular diastolic parameters were  normal. The average left ventricular  global longitudinal strain is -23.6 %. The global longitudinal strain is  normal.   2. Right ventricular systolic function is normal. The right ventricular  size is normal.   3. Left atrial size was mildly dilated.   4. Right atrial size was mildly dilated.   5. The mitral valve is grossly normal. Trivial mitral valve  regurgitation. No evidence of mitral stenosis. There is mild late systolic  prolapse of the middle segment of the anterior leaflet of the mitral  valve.   6. The aortic valve was not well visualized. Aortic valve regurgitation  is not visualized. No aortic stenosis is present.   7. The inferior vena cava is dilated in size with <50% respiratory  variability, suggesting right atrial pressure of 15 mmHg.      Neuro/Psych  PSYCHIATRIC DISORDERS Anxiety     negative neurological ROS     GI/Hepatic Neg liver ROS, hiatal hernia, PUD,,,  Endo/Other  negative endocrine ROS    Renal/GU negative Renal ROS     Musculoskeletal negative musculoskeletal ROS (+)    Abdominal   Peds  Hematology  (+) Blood dyscrasia (Xarelto )   Anesthesia Other Findings A-fib   Reproductive/Obstetrics                              Anesthesia Physical Anesthesia Plan  ASA: 3  Anesthesia Plan: General   Post-op Pain Management:    Induction: Intravenous  PONV Risk Score and Plan: 2 and Ondansetron , Dexamethasone , Midazolam  and Treatment may vary due to age or medical condition  Airway Management Planned: Oral ETT  Additional Equipment:   Intra-op Plan:   Post-operative Plan: Extubation in OR  Informed Consent: I have reviewed the patients History and Physical, chart, labs and discussed the procedure including the risks, benefits and alternatives for the proposed anesthesia with the patient or authorized representative who has indicated his/her understanding and acceptance.     Dental advisory given  Plan Discussed with: CRNA  Anesthesia Plan Comments:          Anesthesia Quick Evaluation

## 2024-07-01 NOTE — Anesthesia Procedure Notes (Signed)
 Procedure Name: Intubation Date/Time: 07/01/2024 10:01 AM  Performed by: Hedy Jarred, CRNAPre-anesthesia Checklist: Patient identified, Emergency Drugs available, Suction available and Patient being monitored Patient Re-evaluated:Patient Re-evaluated prior to induction Oxygen  Delivery Method: Circle System Utilized Preoxygenation: Pre-oxygenation with 100% oxygen  Induction Type: IV induction Ventilation: Mask ventilation without difficulty Laryngoscope Size: Miller and 3 Grade View: Grade I Tube type: Oral Tube size: 7.5 mm Number of attempts: 1 Airway Equipment and Method: Stylet and Oral airway Placement Confirmation: ETT inserted through vocal cords under direct vision, positive ETCO2 and breath sounds checked- equal and bilateral Secured at: 23 cm Tube secured with: Tape Dental Injury: Teeth and Oropharynx as per pre-operative assessment

## 2024-07-02 ENCOUNTER — Telehealth: Payer: Self-pay | Admitting: Family Medicine

## 2024-07-02 ENCOUNTER — Telehealth: Payer: Self-pay

## 2024-07-02 ENCOUNTER — Telehealth (HOSPITAL_COMMUNITY): Payer: Self-pay

## 2024-07-02 ENCOUNTER — Encounter: Payer: Self-pay | Admitting: Emergency Medicine

## 2024-07-02 NOTE — Telephone Encounter (Signed)
 Copied from CRM #[id number]. Topic: Referral - Request for Referral >> Jul 02, 2024  1:17 PM Santiya F wrote: Did the patient discuss referral with their provider in the last year? Yes  Appointment offered? No  Type of order/referral and detailed reason for visit: Patient is requesting a GI doctor   Preference of office, provider, location: no preference   If referral order, have you been seen by this specialty before? No (If Yes, this issue or another issue? When? Where?  Can we respond through MyChart? Yes

## 2024-07-02 NOTE — Telephone Encounter (Signed)
 Spoke with patient to complete post procedure follow up call.  Patient reports no complications with groin sites.   Instructions reviewed with patient:  Remove large bandage at puncture site after 24 hours. It is normal to have bruising, tenderness, mild swelling, and a pea or marble sized lump/knot at the groin site which can take up to three months to resolve.  Get help right away if you notice sudden swelling at the puncture site.  Check your puncture site every day for signs of infection: fever, redness, swelling, pus drainage, warmth, foul odor or excessive pain. If this occurs, please call [phone], to speak with the RN Navigator. Get help right away if your puncture site is bleeding and the bleeding does not stop after applying firm pressure to the area.  You may continue to have skipped beats/ atrial fibrillation during the first several months after your procedure.  It is very important not to miss any doses of your blood thinner Xarelto .    You will follow up with the Afib clinic 4 weeks after your procedure and follow up with the Afib clinic 3 months after your procedure.  Activity restrictions reviewed.  Patient verbalized understanding to all instructions provided.

## 2024-07-02 NOTE — Telephone Encounter (Signed)
 A document form from the [facility] has been faxed: request for medical information, to be filled out by provider. Send document back via Fax within 7-days to 14 days. Document is located in providers tray at front office.          Fax number: [phone]

## 2024-07-03 NOTE — Telephone Encounter (Signed)
 Form has been signed and faxed back to the [facility] along with OV noes as requested.

## 2024-07-04 MED FILL — Fentanyl Citrate Preservative Free (PF) Inj 100 MCG/2ML: INTRAMUSCULAR | Qty: 2 | Status: AC

## 2024-07-11 ENCOUNTER — Telehealth: Payer: Self-pay | Admitting: Family Medicine

## 2024-07-11 ENCOUNTER — Emergency Department (HOSPITAL_COMMUNITY): Admission: EM | Admit: 2024-07-11 | Discharge: 2024-07-11 | Disposition: A | Discharge status: Home / Self Care

## 2024-07-11 ENCOUNTER — Encounter (HOSPITAL_COMMUNITY): Payer: Self-pay

## 2024-07-11 ENCOUNTER — Telehealth: Payer: Self-pay | Admitting: Internal Medicine

## 2024-07-11 ENCOUNTER — Emergency Department (HOSPITAL_COMMUNITY)

## 2024-07-11 ENCOUNTER — Other Ambulatory Visit: Payer: Self-pay

## 2024-07-11 ENCOUNTER — Telehealth: Payer: Self-pay

## 2024-07-11 DIAGNOSIS — K921 Melena: Secondary | ICD-10-CM | POA: Insufficient documentation

## 2024-07-11 DIAGNOSIS — Z7901 Long term (current) use of anticoagulants: Secondary | ICD-10-CM | POA: Diagnosis not present

## 2024-07-11 DIAGNOSIS — I10 Essential (primary) hypertension: Secondary | ICD-10-CM | POA: Insufficient documentation

## 2024-07-11 DIAGNOSIS — I4891 Unspecified atrial fibrillation: Secondary | ICD-10-CM | POA: Diagnosis not present

## 2024-07-11 LAB — COMPREHENSIVE METABOLIC PANEL WITH GFR
ALT: 12 U/L (ref 0–44)
AST: 12 U/L — ABNORMAL LOW (ref 15–41)
Albumin: 3.6 g/dL (ref 3.5–5.0)
Alkaline Phosphatase: 86 U/L (ref 38–126)
Anion gap: 10 (ref 5–15)
BUN: 8 mg/dL (ref 6–20)
CO2: 22 mmol/L (ref 22–32)
Calcium: 7.8 mg/dL — ABNORMAL LOW (ref 8.9–10.3)
Chloride: 108 mmol/L (ref 98–111)
Creatinine, Ser: 1.03 mg/dL (ref 0.61–1.24)
GFR, Estimated: >60 mL/min (ref >60)
Glucose, Bld: 92 mg/dL (ref 70–99)
Potassium: 3.8 mmol/L (ref 3.5–5.1)
Sodium: 140 mmol/L (ref 135–145)
Total Bilirubin: 1.4 mg/dL — ABNORMAL HIGH (ref 0.0–1.2)
Total Protein: 6.2 g/dL — ABNORMAL LOW (ref 6.5–8.1)

## 2024-07-11 LAB — TYPE AND SCREEN
ABO/RH(D): O POS
Antibody Screen: NEGATIVE

## 2024-07-11 LAB — CBC WITH DIFFERENTIAL/PLATELET
Abs Immature Granulocytes: 0.02 K/uL (ref 0.00–0.07)
Basophils Absolute: 0.1 K/uL (ref 0.0–0.1)
Basophils Relative: 1 %
Eosinophils Absolute: 0.1 K/uL (ref 0.0–0.5)
Eosinophils Relative: 1 %
HCT: 37.4 % — ABNORMAL LOW (ref 39.0–52.0)
Hemoglobin: 12.5 g/dL — ABNORMAL LOW (ref 13.0–17.0)
Immature Granulocytes: 0 %
Lymphocytes Relative: 15 %
Lymphs Abs: 1.2 K/uL (ref 0.7–4.0)
MCH: 29.1 pg (ref 26.0–34.0)
MCHC: 33.4 g/dL (ref 30.0–36.0)
MCV: 87 fL (ref 80.0–100.0)
Monocytes Absolute: 0.4 K/uL (ref 0.1–1.0)
Monocytes Relative: 5 %
Neutro Abs: 5.9 K/uL (ref 1.7–7.7)
Neutrophils Relative %: 78 %
Platelets: 230 K/uL (ref 150–400)
RBC: 4.3 MIL/uL (ref 4.22–5.81)
RDW: 17.7 % — ABNORMAL HIGH (ref 11.5–15.5)
WBC: 7.7 K/uL (ref 4.0–10.5)
nRBC: 0 % (ref 0.0–0.2)

## 2024-07-11 LAB — PROTIME-INR
INR: 1 (ref 0.8–1.2)
Prothrombin Time: 13.8 s (ref 11.4–15.2)

## 2024-07-11 MED ORDER — IOHEXOL 350 MG/ML SOLN
75.0000 mL | Freq: Once | INTRAVENOUS | Status: AC | PRN
  Administered 2024-07-11: 75 mL via INTRAVENOUS

## 2024-07-11 MED ORDER — FENTANYL CITRATE (PF) 50 MCG/ML IJ SOSY
50.0000 ug | PREFILLED_SYRINGE | Freq: Once | INTRAMUSCULAR | Status: AC
  Administered 2024-07-11: 50 ug via INTRAVENOUS
  Filled 2024-07-11: qty 1

## 2024-07-11 MED ORDER — SODIUM CHLORIDE 0.9 % IV BOLUS
1000.0000 mL | Freq: Once | INTRAVENOUS | Status: AC
  Administered 2024-07-11: 1000 mL via INTRAVENOUS

## 2024-07-11 MED ORDER — ONDANSETRON HCL 4 MG/2ML IJ SOLN
4.0000 mg | Freq: Once | INTRAMUSCULAR | Status: AC
  Administered 2024-07-11: 4 mg via INTRAVENOUS
  Filled 2024-07-11: qty 2

## 2024-07-11 NOTE — Telephone Encounter (Signed)
 Called by emergency department.  Patient with approximately 69-month history of rectal bleeding.  Hemoglobin 12.  Has had some lightheadedness but also has history of A-fib.  He is on Xarelto .     Latest Ref Rng & Units 07/11/2024    1:25 PM 06/10/2024    9:49 AM 05/06/2024    6:32 AM  CBC  WBC 4.0 - 10.5 K/uL 7.7  4.3  5.2   Hemoglobin 13.0 - 17.0 g/dL 87.4  86.8  88.6   Hematocrit 39.0 - 52.0 % 37.4  39.8  34.0   Platelets 150 - 400 K/uL 230  236  205    Did not need admission.  Request for outpatient gastroenterology appointment.  He can have my 130 appointment that is open on 1113.  I am double booked at 150 but that is okay.

## 2024-07-11 NOTE — Telephone Encounter (Signed)
 Copied from CRM [id number]. Topic: Referral - Question >> Jul 11, 2024 11:53 AM Myrick T wrote: Reason for CRM: patient called to f/u on the referral for the GI Specialist. Please f/u with patient

## 2024-07-11 NOTE — ED Provider Triage Note (Signed)
 Emergency Medicine Provider Triage Evaluation Note  Samuel Flynn , a 48 y.o. male  was evaluated in triage.  Pt complains of abdominal pain and bloody stool.  History of gastric bypass.  On Xarelto  history of A-fib recent ablation.  Patient had a perforated lesion stroke ulcer and underwent surgery and late August of this year.  He states that since that time he has had bloody stools almost every day and pain on the right side of his abdomen.  He reports that he has been having episodes of near syncope and shortness of breath.  Review of Systems  Positive: Abd pain and bloody stool  Negative: Fever   Physical Exam  BP (!) 165/90 (BP Location: Right Arm)   Pulse (!) 49   Temp 98.1 F (36.7 C)   Resp 18   Ht 6' 2 (1.88 m)   Wt 77.1 kg   SpO2 100%   BMI 21.83 kg/m  Gen:   Awake, no distress   Resp:  Normal effort  MSK:   Moves extremities without difficulty  Other:  No guarding   Medical Decision Making  Medically screening exam initiated at 1:13 PM.  Appropriate orders placed.  Samuel Flynn was informed that the remainder of the evaluation will be completed by another provider, this initial triage assessment does not replace that evaluation, and the importance of remaining in the ED until their evaluation is complete.     Samuel Chroman, PA-C 07/11/24 1315

## 2024-07-11 NOTE — ED Triage Notes (Signed)
 Patient here with complaints of a lot of blood in stool. He has surgery on his intestines about a month ago. Patient reports nausea, vomiting and diarrhea. He also reports near syncopal episodes

## 2024-07-11 NOTE — Telephone Encounter (Unsigned)
 Copied from CRM [id number]. Topic: Referral - Status >> Jul 11, 2024 11:44 AM Tiffini S wrote: Reason for CRM: Patient spouse Kamare Caspers [phone] called about referral update- patient has blood in feces two to three daily- they are concerned/ patient had surgery before for GI isssues and would like to talk with nurse today   Please call the patient at [phone] (M)

## 2024-07-11 NOTE — Telephone Encounter (Signed)
 Spoke with patient.

## 2024-07-11 NOTE — Discharge Instructions (Signed)
 Your hemoglobin, vital signs were reassuring.  CT scan did not show any worrisome findings.  No evidence of large-volume bleed.  I spoke to gastroenterologist Dr. Avram who will have someone from the office reach out to schedule an appointment.  His office information is attached above.  Also follow-up with your surgeon.  Return for any emergent symptoms.

## 2024-07-11 NOTE — Telephone Encounter (Signed)
 Called pt and let him know yes I faxed his documents to the [facility] last week.   Copied from CRM [id number]. Topic: General - Other >> Jul 11, 2024 11:55 AM Myrick T wrote: Reason for CRM: patient would like someone to call him to verify that his paperwork was sent to the [facility] with the ICD code that needed, Please f/u with patient

## 2024-07-11 NOTE — ED Provider Notes (Signed)
 [place] EMERGENCY DEPARTMENT AT [facility] Provider Note   CSN: [id number] Arrival date & time: 07/11/24  1228     Patient presents with: No chief complaint on file.   Samuel Flynn is a 48 y.o. male.   48 year old male with history of perforated viscus who underwent surgery in August presents today for concern of abdominal pain, and bloody stool since the time of the surgery.  He followed up with his surgeon who recommended GI referral.  This has not happened yet.  He is without acute distress.  Wife is at bedside.  Reports she has been having near daily near syncopal episodes.  The history is provided by the patient. No language interpreter was used.       Prior to Admission medications   Medication Sig Start Date End Date Taking? Authorizing Provider  Multiple Vitamins-Minerals (BARIATRIC MULTIVITAMINS PO) Take 1 tablet by mouth daily.    [provider]  pantoprazole  (PROTONIX ) 40 MG tablet Take 1 tablet (40 mg total) by mouth 2 (two) times daily. 05/06/24   Tammy Sor, PA-C  rivaroxaban  (XARELTO ) 20 MG TABS tablet Take 1 tablet (20 mg total) by mouth daily with supper. 04/07/24   Leverne Charlies Helling, PA-C  thiamine  (VITAMIN B-1) 100 MG tablet Take 1 tablet (100 mg total) by mouth daily. 05/07/24   Rashid, Farhan, MD    Allergies: Patient has no known allergies.    Review of Systems  Constitutional:  Negative for chills and fever.  Respiratory:  Negative for shortness of breath.   Cardiovascular:  Negative for chest pain.  Gastrointestinal:  Positive for abdominal pain and blood in stool. Negative for nausea and vomiting.  Neurological:  Negative for light-headedness.  All other systems reviewed and are negative.   Updated Vital Signs BP 139/78 (BP Location: Right Arm)   Pulse (!) 51   Temp 98.4 F (36.9 C)   Resp 18   Ht 6' 2 (1.88 m)   Wt 77.1 kg   SpO2 100%   BMI 21.83 kg/m   Physical Exam Vitals and nursing note reviewed.   Constitutional:      General: He is not in acute distress.    Appearance: Normal appearance. He is not ill-appearing.  HENT:     Head: Normocephalic and atraumatic.     Nose: Nose normal.  Eyes:     Conjunctiva/sclera: Conjunctivae normal.  Cardiovascular:     Rate and Rhythm: Normal rate and regular rhythm.  Pulmonary:     Effort: Pulmonary effort is normal. No respiratory distress.  Abdominal:     General: There is no distension.     Palpations: Abdomen is soft.     Tenderness: There is no abdominal tenderness. There is no guarding.  Genitourinary:    Comments: GU exam deferred Musculoskeletal:        General: No deformity. Normal range of motion.  Skin:    Findings: No rash.  Neurological:     Mental Status: He is alert.     (all labs ordered are listed, but only abnormal results are displayed) Labs Reviewed  COMPREHENSIVE METABOLIC PANEL WITH GFR - Abnormal; Notable for the following components:      Result Value   Calcium  7.8 (*)    Total Protein 6.2 (*)    AST 12 (*)    Total Bilirubin 1.4 (*)    All other components within normal limits  CBC WITH DIFFERENTIAL/PLATELET - Abnormal; Notable for the following components:  Hemoglobin 12.5 (*)    HCT 37.4 (*)    RDW 17.7 (*)    All other components within normal limits  PROTIME-INR  POC OCCULT BLOOD, ED  TYPE AND SCREEN  ABO/RH    EKG: None  Radiology: No results found.   Procedures   Medications Ordered in the ED  sodium chloride  0.9 % bolus 1,000 mL (has no administration in time range)  ondansetron  (ZOFRAN ) injection 4 mg (has no administration in time range)  fentaNYL  (SUBLIMAZE ) injection 50 mcg (has no administration in time range)                                    Medical Decision Making Amount and/or Complexity of Data Reviewed Radiology: ordered.  Risk Prescription drug management.   Medical Decision Making / ED Course   This patient presents to the ED for concern of blood in  stool, this involves an extensive number of treatment options, and is a complaint that carries with it a high risk of complications and morbidity.  The differential diagnosis includes GI bleed, postop complication, hemorrhoids  MDM: 48 year old male presents with above mentioned complaints.  Comfortable appearing on exam.  Abdomen soft, without distention, and no tenderness. Given complaints of rectal bleeding, near syncopal episode, abdominal pain and previous history of perforated viscus will obtain CT. Will reevaluate.  Will provide pain control, fluids, and order CT.  He is on Xarelto  for A-fib.  Bleeding has now been going on for couple months.  No significant change.  He is hemodynamically stable.  CT without acute concerning findings. Patient remained hemodynamically stable. Without tachycardia, hypotension, lightheadedness in the emergency room.  Hemoglobin stable.  Blood mixed in with stool.  No large-volume blood.  Discussed with Dr. Avram.  He will attempt to schedule him an outpatient appointment.  Lab Tests: -I ordered, reviewed, and interpreted labs.   The pertinent results include:   Labs Reviewed  COMPREHENSIVE METABOLIC PANEL WITH GFR - Abnormal; Notable for the following components:      Result Value   Calcium  7.8 (*)    Total Protein 6.2 (*)    AST 12 (*)    Total Bilirubin 1.4 (*)    All other components within normal limits  CBC WITH DIFFERENTIAL/PLATELET - Abnormal; Notable for the following components:   Hemoglobin 12.5 (*)    HCT 37.4 (*)    RDW 17.7 (*)    All other components within normal limits  PROTIME-INR  POC OCCULT BLOOD, ED  TYPE AND SCREEN  ABO/RH      EKG  EKG Interpretation Date/Time:    Ventricular Rate:    PR Interval:    QRS Duration:    QT Interval:    QTC Calculation:   R Axis:      Text Interpretation:           Imaging Studies ordered: I ordered imaging studies including ct abdomen pelvis w contrast I independently  visualized and interpreted imaging. I agree with the radiologist interpretation   Medicines ordered and prescription drug management: Meds ordered this encounter  Medications   sodium chloride  0.9 % bolus 1,000 mL   ondansetron  (ZOFRAN ) injection 4 mg   fentaNYL  (SUBLIMAZE ) injection 50 mcg    -I have reviewed the patients home medicines and have made adjustments as needed  Reevaluation: After the interventions noted above, I reevaluated the patient and found that they have :  stayed the same  Co morbidities that complicate the patient evaluation  Past Medical History:  Diagnosis Date   Atrial fibrillation with RVR (HCC)    Gastric ulcer    Gout    Hypertension    Paroxysmal A-fib (HCC)    Sleep apnea    USES CPAP   Wears glasses       Dispostion: Discharged in stable condition.  Return precautions given.  Discussed follow-up with his surgeon and GI referral given.  Spoke to Dr. Avram who will attempt to get him scheduled sooner.  Final diagnoses:  Blood in stool    ED Discharge Orders     None          Hildegard Loge, PA-C 07/11/24 2207    Samuel Lavonia SAILOR, MD 07/11/24 2238

## 2024-07-14 ENCOUNTER — Telehealth: Payer: Self-pay

## 2024-07-14 NOTE — Telephone Encounter (Signed)
 Copied from CRM [id number]. Topic: General - Other >> Jul 14, 2024  1:40 PM Dedra B wrote: Reason for CRM: Pt is calling regarding a ICD code needed for his disability paperwork. Pls call pt.

## 2024-07-14 NOTE — Telephone Encounter (Signed)
 Pt came in office. Dr. Tanda updated ICD code on form and patient was given a copy.

## 2024-07-14 NOTE — Telephone Encounter (Signed)
 I spoke with Samuel Flynn and he is on for 07/17/2024 at 1:30PM.

## 2024-07-15 ENCOUNTER — Ambulatory Visit (HOSPITAL_COMMUNITY): Admitting: Internal Medicine

## 2024-07-17 ENCOUNTER — Encounter: Payer: Self-pay | Admitting: Internal Medicine

## 2024-07-17 ENCOUNTER — Other Ambulatory Visit (INDEPENDENT_AMBULATORY_CARE_PROVIDER_SITE_OTHER)

## 2024-07-17 ENCOUNTER — Ambulatory Visit: Admitting: Internal Medicine

## 2024-07-17 VITALS — BP 120/62 | HR 56 | Ht 74.0 in | Wt 181.1 lb

## 2024-07-17 DIAGNOSIS — Z9889 Other specified postprocedural states: Secondary | ICD-10-CM | POA: Diagnosis not present

## 2024-07-17 DIAGNOSIS — Z8679 Personal history of other diseases of the circulatory system: Secondary | ICD-10-CM

## 2024-07-17 DIAGNOSIS — D649 Anemia, unspecified: Secondary | ICD-10-CM

## 2024-07-17 DIAGNOSIS — Z7901 Long term (current) use of anticoagulants: Secondary | ICD-10-CM

## 2024-07-17 DIAGNOSIS — K625 Hemorrhage of anus and rectum: Secondary | ICD-10-CM | POA: Diagnosis not present

## 2024-07-17 LAB — IBC PANEL
Iron: 90 ug/dL (ref 42–165)
Saturation Ratios: 35.1 % (ref 20.0–50.0)
TIBC: 256.2 ug/dL (ref 250.0–450.0)
Transferrin: 183 mg/dL — ABNORMAL LOW (ref 212.0–360.0)

## 2024-07-17 LAB — FERRITIN: Ferritin: 36.9 ng/mL (ref 22.0–322.0)

## 2024-07-17 LAB — VITAMIN B12: Vitamin B-12: 693 pg/mL (ref 211–911)

## 2024-07-17 MED ORDER — HYDROCORTISONE ACETATE 25 MG RE SUPP: 25.0000 mg | Freq: Every day | RECTAL | 0 refills | Status: DC

## 2024-07-17 MED ORDER — HYDROCORTISONE ACETATE 25 MG RE SUPP: 25.0000 mg | Freq: Every day | RECTAL | 0 refills | Status: AC

## 2024-07-17 NOTE — Progress Notes (Addendum)
 Samuel Flynn 48 y.o. [birth year] [id number]  Assessment & Plan:   Encounter Diagnoses  Name Primary?   Rectal bleeding Yes   Normocytic anemia    Long term current use of anticoagulant-Xarelto     S/P ablation of atrial fibrillation 3 times     I think he needs a colonoscopy but need to understand when he could hold Xarelto  since he has had a recent A-fib ablation on 1028.  I will ask cardiology.  Iron studies and B12 in the meantime  Anusol  HC suppositories nightly  Orders Placed This Encounter  Procedures   Vitamin B12   Iron, TIBC and Ferritin Panel   CC: [name], MD   Addendum  Dr. Antonetta recommends that the patient stay on anticoagulation for 3 months after A-fib ablation.  So colonoscopy would be possible in February, off Eliquis then barring unforeseen problems.  I think that is medically acceptable.  He should take iron sulfate we will call him.  Lab Results  Component Value Date   IRON 90 07/17/2024   TIBC 256.2 07/17/2024   FERRITIN 36.9 07/17/2024   Lab Results  Component Value Date   VITAMINB12 693 07/17/2024     Subjective:   Chief Complaint: Rectal bleeding  HPI Discussed the use of AI scribe software for clinical note transcription with the patient, who gave verbal consent to proceed.  The patient, 48 year old man, with a history of perforated marginal ulcer surgery last August, presents with rectal bleeding.  He had been in the emergency department with rectal bleeding on November 7.  He is status post bariatric surgery, also with a history of multiple A-fib ablations the most recent of which was on October 28.  Rectal bleeding - Rectal bleeding present almost daily since perforated ulcer surgery in August - Bleeding initially frequent, then decreased, increased again, and has now slowed - Characterized by red blood mixed with brown stool, no melena no abdominal pain at this time - No recent colonoscopy; last endoscopic  evaluation several years ago in [place] he thinks but chart review does not show this so ? if ever  Anticoagulation therapy - On Xarelto  for atrial fibrillation - History of three ablations, most recent on October 28 - Ablation currently effective, has f/u visit late Nov 25  Gastrointestinal surgical history and sequelae - History of gastric bypass surgery with significant weight loss   Bowel habits - Multiple bowel movements per day - Occasional sensation of constipation despite frequent bowel movements - urger to defecate but unable to produce a stool at times  Cardiopulmonary symptoms - No chest pain - No shortness of breath         Latest Ref Rng & Units 07/11/2024    1:25 PM 06/10/2024    9:49 AM 05/06/2024    6:32 AM  CBC  WBC 4.0 - 10.5 K/uL 7.7  4.3  5.2   Hemoglobin 13.0 - 17.0 g/dL 87.4  86.8  88.6   Hematocrit 39.0 - 52.0 % 37.4  39.8  34.0   Platelets 150 - 400 K/uL 230  236  205    CT abdomen pelvis with contrast July 11, 2024 IMPRESSION: 1. No acute process or explanation for abdominal pain. status post roux gastric bypass. Mildly dilated left abdominal small bowel loop, without transition point identified. Possibly mild atelectasis. 2. Mild limitation secondary to patient arm position. 3. Cholelithiasis. 4. Prostatomegaly. 5. Aortic atherosclerosis (icd10-i70.0).  EGD 2009, large duodenal ulcer, reflux esophagitis.  In the setting of  chronic NSAIDs.   Ejection fraction 60 to 65% by echo July 2025 Previous possible difficult airway with large tongue but subsequent intubations August 2025 October 2025 without difficulty or difficult airway (he has lost 250 pounds with bariatric surgery)   No Known Allergies Current Meds  Medication Sig   Multiple Vitamins-Minerals (BARIATRIC MULTIVITAMINS PO) Take 1 tablet by mouth daily.   pantoprazole  (PROTONIX ) 40 MG tablet Take 1 tablet (40 mg total) by mouth 2 (two) times daily.   rivaroxaban  (XARELTO ) 20 MG  TABS tablet Take 1 tablet (20 mg total) by mouth daily with supper.   thiamine  (VITAMIN B-1) 100 MG tablet Take 1 tablet (100 mg total) by mouth daily.   [DISCONTINUED] hydrocortisone  (ANUSOL -HC) 25 MG suppository Place 1 suppository (25 mg total) rectally at bedtime.   Past Medical History:  Diagnosis Date   Atrial fibrillation with RVR (HCC)    Duodenal ulcer    Gastric ulcer    Gout    Hypertension    Paroxysmal A-fib (HCC)    Sleep apnea    USES CPAP   Wears glasses    Past Surgical History:  Procedure Laterality Date   ABDOMINAL SURGERY     ATRIAL FIBRILLATION ABLATION N/A 04/26/2018   Procedure: ATRIAL FIBRILLATION ABLATION;  Surgeon: Inocencio Soyla Lunger, MD;  Location: [facility] INVASIVE CV LAB;  Service: Cardiovascular;  Laterality: N/A;   ATRIAL FIBRILLATION ABLATION N/A 11/07/2019   Procedure: ATRIAL FIBRILLATION ABLATION;  Surgeon: Inocencio Soyla Lunger, MD;  Location: [facility] INVASIVE CV LAB;  Service: Cardiovascular;  Laterality: N/A;   ATRIAL FIBRILLATION ABLATION N/A 07/01/2024   Procedure: ATRIAL FIBRILLATION ABLATION;  Surgeon: Inocencio Soyla Lunger, MD;  Location: [facility] INVASIVE CV LAB;  Service: Cardiovascular;  Laterality: N/A;   CARDIOVERSION N/A 01/18/2018   Procedure: CARDIOVERSION;  Surgeon: Rolan Ezra RAMAN, MD;  Location: [facility] ENDOSCOPY;  Service: Cardiovascular;  Laterality: N/A;   CARDIOVERSION N/A 08/19/2019   Procedure: CARDIOVERSION;  Surgeon: Alveta Aleene PARAS, MD;  Location: [facility] ENDOSCOPY;  Service: Cardiovascular;  Laterality: N/A;   CARDIOVERSION N/A 04/18/2021   Procedure: CARDIOVERSION;  Surgeon: Alveta Aleene PARAS, MD;  Location: [facility] ENDOSCOPY;  Service: Cardiovascular;  Laterality: N/A;   ELBOW LIGAMENT RECONSTRUCTION Right 09/29/2014   Procedure: HECTOR FRACTURE FIXATION, POSSIBLE LIGAMENT REPAIR;  Surgeon: Eva Elsie Herring, MD;  Location: [facility];  Service: Orthopedics;  Laterality: Right;  Right elbow coranoid fracture fixation, possible  ligament repair   HIATAL HERNIA REPAIR  04/28/2024   Procedure: LAPAROSCOPIC GASTRIC PARAS SPALDING;  Surgeon: Rubin Calamity, MD;  Location[facility] OR;  Service: General;;   LAPAROSCOPY N/A 04/28/2024   Procedure: LAPAROSCOPY, DIAGNOSTIC;  Surgeon: Rubin Calamity, MD;  Location: [facility] OR;  Service: General;  Laterality: N/A;   LEFT HEART CATH AND CORONARY ANGIOGRAPHY N/A 03/31/2024   Procedure: LEFT HEART CATH AND CORONARY ANGIOGRAPHY;  Surgeon: Jordan, Peter M, MD;  Location: [facility] INVASIVE CV LAB;  Service: Cardiovascular;  Laterality: N/A;   TEE WITHOUT CARDIOVERSION N/A 01/18/2018   Procedure: TRANSESOPHAGEAL ECHOCARDIOGRAM (TEE);  Surgeon: Rolan Ezra RAMAN, MD;  Location: [facility] ENDOSCOPY;  Service: Cardiovascular;  Laterality: N/A;   TEE WITHOUT CARDIOVERSION N/A 04/18/2021   Procedure: TRANSESOPHAGEAL ECHOCARDIOGRAM (TEE);  Surgeon: Alveta Aleene PARAS, MD;  Location: [facility] ENDOSCOPY;  Service: Cardiovascular;  Laterality: N/A;   WISDOM TOOTH EXTRACTION     Social History   Social History Narrative   Employed - truck driver   Married 5 biologic and 4 stepchildern   EtOH - beer weekends 4 -  5   [employer] - cigars   Drugs - no   family history includes Heart attack in his father; Heart disease in his father; Hypertension in his father, maternal grandmother, mother, paternal uncle, paternal uncle, paternal uncle, paternal uncle, and paternal uncle; Stroke in his father.   Review of Systems As per HPI otherwise negative  Objective:   Physical Exam @BP  120/62 (BP Location: Left Arm, Patient Position: Sitting, Cuff Size: Normal)   Pulse (!) 56   Ht 6' 2 (1.88 m)   Wt 181 lb 2 oz (82.2 kg)   BMI 23.26 kg/m @  General:  Well-developed, well-nourished and in no acute distress Eyes:  anicteric. ENT:   Mouth and posterior pharynx free of lesions.  Neck:   supple w/o thyromegaly or mass.  Lungs: Clear to auscultation bilaterally. Heart:   S1S2, no rubs, murmurs, gallops. Abdomen:  soft, non-tender,  no hepatosplenomegaly, hernia, or mass and BS+ loose skin.  Rectal: Normal anoderm, suspected palpable hemorrhoids in the anal canal, brown stool no mass Lymph:  no cervical or supraclavicular adenopathy. Extremities:   no edema, cyanosis or clubbing Skin   no rash. Neuro:  A&O x 3.  Psych:  appropriate mood and  Affect.   Data Reviewed: As per HPI I have reviewed recent ED notes, old endoscopy notes, [facility] gastroenterology notes in care everywhere.

## 2024-07-17 NOTE — Patient Instructions (Signed)
 Your provider has requested that you go to the basement level for lab work before leaving today. Press B on the elevator. The lab is located at the first door on the left as you exit the elevator.  Due to recent changes in healthcare laws, you may see the results of your imaging and laboratory studies on MyChart before your provider has had a chance to review them.  We understand that in some cases there may be results that are confusing or concerning to you. Not all laboratory results come back in the same time frame and the provider may be waiting for multiple results in order to interpret others.  Please give us  48 hours in order for your provider to thoroughly review all the results before contacting the office for clarification of your results.   We are providing you with a rx for Anusol  HC to take to the pharmacy. Use [employer] to get a good price.  I appreciate the opportunity to care for you. Lupita Commander, MD, [facility]

## 2024-07-22 ENCOUNTER — Ambulatory Visit: Payer: Self-pay | Admitting: Internal Medicine

## 2024-07-29 ENCOUNTER — Encounter (HOSPITAL_COMMUNITY): Payer: Self-pay

## 2024-07-29 ENCOUNTER — Ambulatory Visit (HOSPITAL_COMMUNITY): Attending: Physician Assistant | Admitting: Physician Assistant

## 2024-07-29 NOTE — Progress Notes (Incomplete)
 Primary Care Physician: [name], MD Primary Cardiologist: Annabella Scarce, MD Electrophysiologist: Will Gladis Norton, MD  Referring Physician: Dr Norton Vinie Samuel Flynn is a 48 y.o. male with a history of HTN, OSA, atrial flutter, atrial fibrillation who presents for follow up in the [facility].  The patient underwent afib ablation 04/26/18 and 11/07/19. He was also maintained on dofetilide  but this was discontinued 06/2023 as he was maintaining SR. He had recurrence of his afib and underwent repeat ablation on 07/01/24. Patient is on Xarelto  for stroke prevention.    Patient presents today for follow up for atrial fibrillation. ***  Today, he denies symptoms of ***palpitations, chest pain, shortness of breath, orthopnea, PND, lower extremity edema, dizziness, presyncope, syncope, snoring, daytime somnolence, bleeding, or neurologic sequela. The patient is tolerating medications without difficulties and is otherwise without complaint today.    Atrial Fibrillation Risk Factors:  he does have symptoms or diagnosis of sleep apnea. he does not have a history of rheumatic fever.   Atrial Fibrillation Management history:  Previous antiarrhythmic drugs: flecainide , dofetilide  Previous cardioversions: 2019, 2020, 2022 Previous ablations: 04/26/18, 11/07/19, 07/01/24 Anticoagulation history: Xarelto   ROS- All systems are reviewed and negative except as per the HPI above.  Past Medical History:  Diagnosis Date   Atrial fibrillation with RVR (HCC)    Duodenal ulcer    Gastric ulcer    Gout    Hypertension    Paroxysmal A-fib (HCC)    Sleep apnea    USES CPAP   Wears glasses     Current Outpatient Medications  Medication Sig Dispense Refill   hydrocortisone  (ANUSOL -HC) 25 MG suppository Place 1 suppository (25 mg total) rectally at bedtime. 12 suppository 0   Multiple Vitamins-Minerals (BARIATRIC MULTIVITAMINS PO) Take 1 tablet by mouth  daily.     pantoprazole  (PROTONIX ) 40 MG tablet Take 1 tablet (40 mg total) by mouth 2 (two) times daily. 180 tablet 0   rivaroxaban  (XARELTO ) 20 MG TABS tablet Take 1 tablet (20 mg total) by mouth daily with supper. 30 tablet 2   thiamine  (VITAMIN B-1) 100 MG tablet Take 1 tablet (100 mg total) by mouth daily. 30 tablet 0   No current facility-administered medications for this visit.    Physical Exam: There were no vitals taken for this visit.  GEN: Well nourished, well developed in no acute distress NECK: No JVD; No carotid bruits*** CARDIAC: {EPRHYTHM:[id number]}, no murmurs, rubs, gallops RESPIRATORY:  Clear to auscultation without rales, wheezing or rhonchi  ABDOMEN: Soft, non-tender, non-distended EXTREMITIES:  No edema; No deformity   Wt Readings from Last 3 Encounters:  07/17/24 82.2 kg  07/11/24 77.1 kg  07/01/24 77.1 kg        ***  Echo 04/01/24 demonstrated   1. Left ventricular ejection fraction, by estimation, is 60 to 65%. The  left ventricle has normal function. The left ventricle has no regional  wall motion abnormalities. Left ventricular diastolic parameters were  normal. The average left ventricular global longitudinal strain is -23.6 %. The global longitudinal strain is normal.   2. Right ventricular systolic function is normal. The right ventricular  size is normal.   3. Left atrial size was mildly dilated.   4. Right atrial size was mildly dilated.   5. The mitral valve is grossly normal. Trivial mitral valve  regurgitation. No evidence of mitral stenosis. There is mild late systolic  prolapse of the middle segment of the anterior leaflet of  the mitral  valve.   6. The aortic valve was not well visualized. Aortic valve regurgitation  is not visualized. No aortic stenosis is present.   7. The inferior vena cava is dilated in size with <50% respiratory  variability, suggesting right atrial pressure of 15 mmHg.   Comparison(s): Prior images reviewed side by  side. Left venitricular  function is more dynamic, IVC is more plethoric.    CHA2DS2-VASc Score = 1  The patient's score is based upon: CHF History: 0 HTN History: 1 Diabetes History: 0 Stroke History: 0 Vascular Disease History: 0 Age Score: 0 Gender Score: 0   {Confirm score is correct.  If not, click here to update score.  REFRESH note.  :1}    ASSESSMENT AND PLAN: Paroxysmal Atrial Fibrillation/atrial flutter (ICD10:  I48.0) The patient's CHA2DS2-VASc score is 1, indicating a 0.6% annual risk of stroke.   S/p afib ablations 04/26/18, 11/07/19, 07/01/24. Patient appears to be maintaining SR*** Continue Xarelto  20 mg daily with no missed doses for 3 months post ablation.   OSA  Encouraged nightly CPAP ***  HTN Stable on current regimen ***   Follow up ***in the AF clinic in 2 months.    {Are you ordering a CV Procedure (e.g. stress test, cath, DCCV, TEE, etc)?   Press F2        :[id number]}   [facility] [address] [place], [place] [address] [phone]

## 2024-08-05 ENCOUNTER — Telehealth: Payer: Self-pay | Admitting: Family Medicine

## 2024-08-05 NOTE — Telephone Encounter (Signed)
 A document form has been faxed: additional information needed for disability claim, to be filled out by provider. Send document back via Fax within 7-days to 14 days. Document is located in providers tray at front office.          Fax number: [phone]

## 2024-08-06 NOTE — Telephone Encounter (Signed)
 Given to Dr. Tanda for review

## 2024-08-08 ENCOUNTER — Encounter: Payer: Self-pay | Admitting: Cardiology

## 2024-08-08 NOTE — Telephone Encounter (Signed)
 Copied from CRM #[id number]. Topic: General - Other >> Aug 07, 2024 10:58 AM Antony RAMAN wrote: Reason for CRM: returning a call for Shona Lacinda BRAVO, CMA

## 2024-08-08 NOTE — Telephone Encounter (Signed)
 Called pt to advise that per Dr. Tanda the questions that they are asking need to be answer by the provider who performed the procedure. No answer, LVM to call back. Pt called back, I advised pt. Pt expressed understanding and stated he will call the other provider.   I also called the [facility] to advise them.

## 2024-08-13 ENCOUNTER — Telehealth: Payer: Self-pay | Admitting: Cardiology

## 2024-08-13 NOTE — Telephone Encounter (Signed)
 Received Disability form from [employer].  Patient came in and signed the Release of Information and paid the $29 fee.  Form in Dr. Carolyn box.

## 2024-08-19 NOTE — Telephone Encounter (Signed)
 Disability form faxed to [employer] [facility] and scanned into chart.  Billing notified.

## 2024-08-25 ENCOUNTER — Other Ambulatory Visit: Payer: Self-pay | Admitting: Family Medicine

## 2024-08-25 DIAGNOSIS — R109 Unspecified abdominal pain: Secondary | ICD-10-CM

## 2024-08-25 DIAGNOSIS — K922 Gastrointestinal hemorrhage, unspecified: Secondary | ICD-10-CM

## 2024-10-02 ENCOUNTER — Encounter (HOSPITAL_COMMUNITY): Payer: Self-pay

## 2024-10-02 ENCOUNTER — Ambulatory Visit (HOSPITAL_COMMUNITY): Admitting: Physician Assistant

## 2024-10-06 ENCOUNTER — Ambulatory Visit: Payer: Self-pay | Admitting: Family Medicine
